# Patient Record
Sex: Female | Born: 1970
Health system: Southern US, Community
[De-identification: ages and names within clinical notes are randomized; demographics above are authoritative.]

## PROBLEM LIST (undated history)

## (undated) DIAGNOSIS — J40 Bronchitis, not specified as acute or chronic: Secondary | ICD-10-CM

## (undated) DIAGNOSIS — I10 Essential (primary) hypertension: Secondary | ICD-10-CM

## (undated) DIAGNOSIS — F32A Depression, unspecified: Secondary | ICD-10-CM

## (undated) DIAGNOSIS — F419 Anxiety disorder, unspecified: Secondary | ICD-10-CM

## (undated) DIAGNOSIS — F329 Major depressive disorder, single episode, unspecified: Secondary | ICD-10-CM

## (undated) DIAGNOSIS — F319 Bipolar disorder, unspecified: Secondary | ICD-10-CM

## (undated) DIAGNOSIS — I499 Cardiac arrhythmia, unspecified: Secondary | ICD-10-CM

## (undated) DIAGNOSIS — R51 Headache: Secondary | ICD-10-CM

## (undated) DIAGNOSIS — K529 Noninfective gastroenteritis and colitis, unspecified: Secondary | ICD-10-CM

## (undated) HISTORY — DX: Depression, unspecified: F32.A

## (undated) HISTORY — DX: Major depressive disorder, single episode, unspecified: F32.9

## (undated) HISTORY — DX: Headache: R51

## (undated) HISTORY — DX: Cardiac arrhythmia, unspecified: I49.9

## (undated) HISTORY — PX: BONE GRAFT HIP ILIAC CREST: SUR159

## (undated) HISTORY — DX: Anxiety disorder, unspecified: F41.9

## (undated) HISTORY — PX: NECK SURGERY: SHX720

## (undated) HISTORY — DX: Bipolar disorder, unspecified: F31.9

## (undated) HISTORY — DX: Noninfective gastroenteritis and colitis, unspecified: K52.9

## (undated) HISTORY — DX: Essential (primary) hypertension: I10

---

## 1995-06-07 HISTORY — PX: TUBAL LIGATION: SHX77

## 2003-11-28 ENCOUNTER — Emergency Department (HOSPITAL_COMMUNITY): Admission: EM | Admit: 2003-11-28 | Discharge: 2003-11-28 | Payer: Self-pay | Admitting: Emergency Medicine

## 2004-03-10 ENCOUNTER — Observation Stay (HOSPITAL_COMMUNITY): Admission: AC | Admit: 2004-03-10 | Discharge: 2004-03-11 | Payer: Self-pay

## 2005-06-06 HISTORY — PX: CARDIAC VALVE SURGERY: SHX40

## 2006-02-02 ENCOUNTER — Other Ambulatory Visit: Admission: RE | Admit: 2006-02-02 | Discharge: 2006-02-02 | Payer: Self-pay | Admitting: Family Medicine

## 2006-05-23 ENCOUNTER — Ambulatory Visit (HOSPITAL_COMMUNITY): Admission: RE | Admit: 2006-05-23 | Discharge: 2006-05-23 | Payer: Self-pay | Admitting: Family Medicine

## 2006-06-30 ENCOUNTER — Encounter
Admission: RE | Admit: 2006-06-30 | Discharge: 2006-09-28 | Payer: Self-pay | Admitting: Physical Medicine & Rehabilitation

## 2006-07-03 ENCOUNTER — Ambulatory Visit: Payer: Self-pay | Admitting: Physical Medicine & Rehabilitation

## 2006-08-11 ENCOUNTER — Other Ambulatory Visit: Admission: RE | Admit: 2006-08-11 | Discharge: 2006-08-11 | Payer: Self-pay | Admitting: Obstetrics and Gynecology

## 2006-08-29 ENCOUNTER — Encounter: Admission: RE | Admit: 2006-08-29 | Discharge: 2006-08-29 | Payer: Self-pay | Admitting: Orthopaedic Surgery

## 2006-09-05 ENCOUNTER — Ambulatory Visit (HOSPITAL_COMMUNITY): Admission: RE | Admit: 2006-09-05 | Discharge: 2006-09-05 | Payer: Self-pay | Admitting: Obstetrics and Gynecology

## 2006-11-21 ENCOUNTER — Encounter: Payer: Self-pay | Admitting: Orthopaedic Surgery

## 2006-11-21 ENCOUNTER — Ambulatory Visit (HOSPITAL_COMMUNITY): Admission: RE | Admit: 2006-11-21 | Discharge: 2006-11-21 | Payer: Self-pay | Admitting: Diagnostic Radiology

## 2006-12-13 ENCOUNTER — Ambulatory Visit: Payer: Self-pay | Admitting: Cardiology

## 2006-12-25 ENCOUNTER — Ambulatory Visit: Payer: Self-pay

## 2006-12-25 ENCOUNTER — Encounter: Payer: Self-pay | Admitting: Cardiology

## 2006-12-30 ENCOUNTER — Emergency Department (HOSPITAL_COMMUNITY): Admission: EM | Admit: 2006-12-30 | Discharge: 2006-12-30 | Payer: Self-pay | Admitting: Emergency Medicine

## 2007-01-04 ENCOUNTER — Ambulatory Visit: Payer: Self-pay | Admitting: Internal Medicine

## 2007-01-22 ENCOUNTER — Ambulatory Visit: Payer: Self-pay | Admitting: Internal Medicine

## 2007-01-22 LAB — CONVERTED CEMR LAB
BUN: 5 mg/dL — ABNORMAL LOW (ref 6–23)
Basophils Absolute: 0 10*3/uL (ref 0.0–0.1)
Basophils Relative: 0.1 % (ref 0.0–1.0)
CO2: 27 meq/L (ref 19–32)
Calcium: 8.9 mg/dL (ref 8.4–10.5)
Chloride: 109 meq/L (ref 96–112)
Creatinine, Ser: 0.9 mg/dL (ref 0.4–1.2)
Eosinophils Absolute: 0.4 10*3/uL (ref 0.0–0.6)
Eosinophils Relative: 3.6 % (ref 0.0–5.0)
GFR calc Af Amer: 90 mL/min
GFR calc non Af Amer: 74 mL/min
Glucose, Bld: 94 mg/dL (ref 70–99)
HCT: 46.4 % — ABNORMAL HIGH (ref 36.0–46.0)
Hemoglobin: 16.1 g/dL — ABNORMAL HIGH (ref 12.0–15.0)
Lymphocytes Relative: 21.2 % (ref 12.0–46.0)
MCHC: 34.7 g/dL (ref 30.0–36.0)
MCV: 95.4 fL (ref 78.0–100.0)
Monocytes Absolute: 1.3 10*3/uL — ABNORMAL HIGH (ref 0.2–0.7)
Monocytes Relative: 11.2 % — ABNORMAL HIGH (ref 3.0–11.0)
Neutro Abs: 7.2 10*3/uL (ref 1.4–7.7)
Neutrophils Relative %: 63.9 % (ref 43.0–77.0)
Platelets: 241 10*3/uL (ref 150–400)
Potassium: 3.8 meq/L (ref 3.5–5.1)
RBC: 4.87 M/uL (ref 3.87–5.11)
RDW: 13.2 % (ref 11.5–14.6)
Sodium: 140 meq/L (ref 135–145)
WBC: 11.3 10*3/uL — ABNORMAL HIGH (ref 4.5–10.5)

## 2007-01-24 ENCOUNTER — Ambulatory Visit: Payer: Self-pay | Admitting: Internal Medicine

## 2007-01-24 ENCOUNTER — Ambulatory Visit (HOSPITAL_COMMUNITY): Admission: RE | Admit: 2007-01-24 | Discharge: 2007-01-25 | Payer: Self-pay | Admitting: Internal Medicine

## 2007-02-22 ENCOUNTER — Ambulatory Visit: Payer: Self-pay | Admitting: Cardiology

## 2007-02-28 ENCOUNTER — Ambulatory Visit: Payer: Self-pay

## 2007-05-17 ENCOUNTER — Emergency Department (HOSPITAL_COMMUNITY): Admission: EM | Admit: 2007-05-17 | Discharge: 2007-05-17 | Payer: Self-pay | Admitting: Emergency Medicine

## 2007-05-24 ENCOUNTER — Ambulatory Visit (HOSPITAL_BASED_OUTPATIENT_CLINIC_OR_DEPARTMENT_OTHER): Admission: RE | Admit: 2007-05-24 | Discharge: 2007-05-24 | Payer: Self-pay | Admitting: *Deleted

## 2007-06-21 ENCOUNTER — Ambulatory Visit: Payer: Self-pay | Admitting: Pulmonary Disease

## 2007-07-20 ENCOUNTER — Ambulatory Visit: Payer: Self-pay | Admitting: Internal Medicine

## 2007-11-16 ENCOUNTER — Emergency Department (HOSPITAL_COMMUNITY): Admission: EM | Admit: 2007-11-16 | Discharge: 2007-11-16 | Payer: Self-pay | Admitting: Emergency Medicine

## 2007-11-22 ENCOUNTER — Emergency Department (HOSPITAL_COMMUNITY): Admission: EM | Admit: 2007-11-22 | Discharge: 2007-11-22 | Payer: Self-pay | Admitting: Emergency Medicine

## 2007-12-11 ENCOUNTER — Ambulatory Visit: Payer: Self-pay | Admitting: Internal Medicine

## 2008-01-01 ENCOUNTER — Ambulatory Visit: Payer: Self-pay | Admitting: Internal Medicine

## 2009-06-06 ENCOUNTER — Ambulatory Visit: Payer: Self-pay | Admitting: Family Medicine

## 2009-06-06 DIAGNOSIS — J209 Acute bronchitis, unspecified: Secondary | ICD-10-CM | POA: Insufficient documentation

## 2009-06-06 LAB — CONVERTED CEMR LAB: Rapid Strep: NEGATIVE

## 2009-06-11 ENCOUNTER — Encounter (INDEPENDENT_AMBULATORY_CARE_PROVIDER_SITE_OTHER): Payer: Self-pay | Admitting: *Deleted

## 2009-07-20 ENCOUNTER — Ambulatory Visit: Payer: Self-pay | Admitting: Cardiology

## 2009-07-20 ENCOUNTER — Emergency Department (HOSPITAL_COMMUNITY): Admission: EM | Admit: 2009-07-20 | Discharge: 2009-07-20 | Payer: Self-pay | Admitting: Emergency Medicine

## 2009-07-29 ENCOUNTER — Encounter: Payer: Self-pay | Admitting: Internal Medicine

## 2009-07-30 ENCOUNTER — Encounter: Payer: Self-pay | Admitting: Physician Assistant

## 2009-07-30 ENCOUNTER — Ambulatory Visit: Payer: Self-pay | Admitting: Cardiology

## 2009-07-30 DIAGNOSIS — I456 Pre-excitation syndrome: Secondary | ICD-10-CM

## 2009-07-30 DIAGNOSIS — I10 Essential (primary) hypertension: Secondary | ICD-10-CM

## 2009-07-30 DIAGNOSIS — I471 Supraventricular tachycardia, unspecified: Secondary | ICD-10-CM

## 2009-07-30 DIAGNOSIS — E781 Pure hyperglyceridemia: Secondary | ICD-10-CM | POA: Insufficient documentation

## 2009-07-30 DIAGNOSIS — I2510 Atherosclerotic heart disease of native coronary artery without angina pectoris: Secondary | ICD-10-CM

## 2009-07-30 DIAGNOSIS — F17200 Nicotine dependence, unspecified, uncomplicated: Secondary | ICD-10-CM | POA: Insufficient documentation

## 2009-07-30 DIAGNOSIS — F341 Dysthymic disorder: Secondary | ICD-10-CM

## 2009-07-30 HISTORY — DX: Supraventricular tachycardia, unspecified: I47.10

## 2009-07-30 HISTORY — DX: Pure hyperglyceridemia: E78.1

## 2009-07-30 HISTORY — DX: Essential (primary) hypertension: I10

## 2010-01-02 ENCOUNTER — Emergency Department (HOSPITAL_COMMUNITY): Admission: EM | Admit: 2010-01-02 | Discharge: 2010-01-02 | Payer: Self-pay | Admitting: Emergency Medicine

## 2010-03-30 ENCOUNTER — Ambulatory Visit: Payer: Self-pay | Admitting: Cardiology

## 2010-03-30 ENCOUNTER — Observation Stay (HOSPITAL_COMMUNITY): Admission: EM | Admit: 2010-03-30 | Discharge: 2010-03-31 | Payer: Self-pay | Admitting: Emergency Medicine

## 2010-04-02 ENCOUNTER — Telehealth (INDEPENDENT_AMBULATORY_CARE_PROVIDER_SITE_OTHER): Payer: Self-pay | Admitting: *Deleted

## 2010-05-12 ENCOUNTER — Encounter (INDEPENDENT_AMBULATORY_CARE_PROVIDER_SITE_OTHER): Payer: Self-pay | Admitting: *Deleted

## 2010-06-27 ENCOUNTER — Encounter: Payer: Self-pay | Admitting: Obstetrics and Gynecology

## 2010-06-28 ENCOUNTER — Encounter: Payer: Self-pay | Admitting: Orthopaedic Surgery

## 2010-07-06 NOTE — Letter (Signed)
Summary: Appointment - Missed  [facility], Main Office  [address]   [place], [place] [address]   Phone: [phone]  Fax: [phone]     May 12, 2010 MRN: [id number]   [facility] [address] [place], [place]  [address]   Dear Ms. Allsbrook,  Our records indicate you missed your appointment on 11-29-2011with Dr. Graciela Husbands .                                    It is very important that we reach you to reschedule this appointment. We look forward to participating in your health care needs. Please contact us at the number listed above at your earliest convenience to reschedule this appointment.     Sincerely,   Lorne Skeens  [facility] Scheduling Team

## 2010-07-06 NOTE — Assessment & Plan Note (Signed)
Summary: eph/jss   Visit Type:  EPH   History of Present Illness: this is a 40 year old white female patient, who has a history of WPW and underwent ablation in August of 2008. She also had a negative Myoview at that time for chest pain. She presented emergency room July 20, 2009 with recurrent palpitations that have been worsening over the past 6 months. She was found to be in PAT at approximately 150 beats per minute. She was seen by Dr. all read in the emergency room, who discussed admission, and possible repeat ablation versus medications. The patient opted to try medications and was started on Cardizem and sent home for outpatient followup.  The patient continues to have palpitations worse with any activity. She said her heart races and sometimes it skips. she does become a little dizzy, but has not had any syncope. She denies chest pain, dyspnea, or dyspnea on exertion with this.She says her Cardizem has not made any of it better. She continues to drink quite a bit of caffeine. She was drinking a half a liter to 2 L of Coke per day. She is trying to add caffeine free diet coke and do this but is developing severe headaches.  Current Medications (verified): 1)  Cymbalta 60 Mg Cpep (Duloxetine Hcl) .... Take One Capsule Once Daily 2)  Trazodone Hcl 300 Mg Tabs (Trazodone Hcl) .Marland Kitchen.. 1 Tab By Mouth Once Daily 3)  Sumatriptan Succinate 100 Mg Tabs (Sumatriptan Succinate) .Marland Kitchen.. 1 Tab As Needed 4)  Naproxen 500 Mg Tabs (Naproxen) .Marland Kitchen.. 1 Tab Two Times A Day 5)  Pataday 0.2 % Soln (Olopatadine Hcl) .Marland Kitchen.. 1 Gtt Ou Once Daily 6)  Nexium 40 Mg Cpdr (Esomeprazole Magnesium) .Marland Kitchen.. 1 Cap Once Daily 7)  Diltiazem Hcl Cr 240 Mg Xr24h-Cap (Diltiazem Hcl) .Marland Kitchen.. 1 Cap Once Daily 8)  Seroquel Xr 200 Mg Xr24h-Tab (Quetiapine Fumarate) .Marland Kitchen.. 1 Tab At 6 Pm 9)  Clonazepam 1 Mg Tabs (Clonazepam) .Marland Kitchen.. 1 Tab Two Times A Day  Allergies (verified): No Known Drug Allergies  Past History:  Family history reviewed for  relevance to current acute and chronic problems.her mother was recently diagnosed with atrial fibrillation  Past Medical History: Reviewed history from 05/14/2009 and no changes required. Wolff-Parkinson-White syndrome.      a.     Status post radio-frequency ablation January 24, 2007 for        antidromic supraventricular tachycardia.  History of atypical chest pain      a.     December 25, 2006, Adenosine Myoview with ejection fraction of        52%. Normal stress nuclear study  Migraine headaches  History of hypertriglyceridemia  Ongoing tobacco abuse.   Anxiety   History of hypertension  GERD  Past Surgical History: Reviewed history from 05/14/2009 and no changes required.  radiofrequency catheter ablation  Family History: Reviewed history from 05/14/2009 and no changes required. Significant for the death of the patient's father at age 63 with a brain tumor.   Her mother apparently has some type of cardiac disorder in her mid 20s (details not clear)  Social History: Reviewed history from 05/14/2009 and no changes required. Patient is divorced.   She has three children.   She is presently unemployed.   She has a 1-1/2 pack per day tobacco use history for 15 years.  Denies any recreational drug use or alcohol use.  She does admit to fairly significant caffeine use.   She is not exercising regularly.   Review  of Systems       see history of present illness  Vital Signs:  Patient profile:   40 year old female Height:      65 inches Weight:      191 pounds BMI:     31.90 Pulse rate:   101 / minute Pulse rhythm:   irregular BP sitting:   134 / 80  (left arm) Cuff size:   large  Vitals Entered By: Danielle Rankin, CMA (July 30, 2009 12:20 PM)  Physical Exam  General:   Well-nournished, in no acute distress. Neck: No JVD, HJR, Bruit, or thyroid enlargement Lungs: No tachypnea, clear without wheezing, rales, or rhonchi Cardiovascular: RRR, PMI not displaced, heart  sounds normal, no murmurs, gallops, bruit, thrill, or heave. Abdomen: BS normal. Soft without organomegaly, masses, lesions or tenderness. Extremities: without cyanosis, clubbing or edema. Good distal pulses bilateral SKin: Warm, no lesions or rashes  Musculoskeletal: No deformities Neuro: no focal signs    EKG  Procedure date:  07/30/2009  Findings:      sinus tachycardia at 101 beats per minute, nonspecific ST-T wave changes, poor R wave progression  Impression & Recommendations:  Problem # 1:  SVT/ PSVT/ PAT (ICD-427.0) Patient had recurrent PAT in the hospital and was given a trial of Cardizem. This has not helped. I will increase her Cardizem to 240 mg in the morning. 120 in the evening. I've asked her to try harder to cut back on her caffeine. We will try to move her appointment up to see Dr. Sherryl Manges, back sooner. Her updated medication list for this problem includes:    Diltiazem Hcl Cr 240 Mg Xr24h-cap (Diltiazem hcl) .Marland Kitchen... 1 cap once daily    Diltiazem Hcl Er Beads 120 Mg Xr24h-cap (Diltiazem hcl er beads) .Marland Kitchen... Take one capsule by mouth every  evening  Problem # 2:  WPW (ICD-426.7) Patient has a history of WPW treated with ablation in 2008. Her updated medication list for this problem includes:    Diltiazem Hcl Cr 240 Mg Xr24h-cap (Diltiazem hcl) .Marland Kitchen... 1 cap once daily    Diltiazem Hcl Er Beads 120 Mg Xr24h-cap (Diltiazem hcl er beads) .Marland Kitchen... Take one capsule by mouth every  evening  Problem # 3:  HYPERTENSION, BENIGN (ICD-401.1) blood pressure stable Her updated medication list for this problem includes:    Diltiazem Hcl Cr 240 Mg Xr24h-cap (Diltiazem hcl) .Marland Kitchen... 1 cap once daily    Diltiazem Hcl Er Beads 120 Mg Xr24h-cap (Diltiazem hcl er beads) .Marland Kitchen... Take one capsule by mouth every  evening  Other Orders: EKG w/ Interpretation (93000)  Patient Instructions: 1)  Your physician recommends that you schedule a follow-up appointment in: 2 weeks with Dr. Graciela Husbands 2)   Your MD recommeds to wean off caffeine. New medication: Diltiazem 120 mg take one tablet by mouth  every evening Prescriptions: DILTIAZEM HCL ER BEADS 120 MG XR24H-CAP (DILTIAZEM HCL ER BEADS) Take one capsule by mouth every  evening  #30 x 6   Entered by:   Ollen Gross, RN, BSN   Authorized by:   Marletta Lor, PA-C   Signed by:   Ollen Gross, RN, BSN on 07/30/2009   Method used:   Electronically to        [facility]  [address] [id number] (retail)       [address]       [place], [place]  [address]  Ph: [phone]       Fax: [phone]   RxID:   [id number]

## 2010-07-06 NOTE — Letter (Signed)
Summary: Appointment - Missed  [facility], Main Office  [address]   [place], [place] [address]   Phone: [phone]  Fax: [phone]     June 11, 2009 MRN: [id number]   [facility] [address] [place], [place]  [address]   Dear Adriana Hall,  Our records indicate you missed your appointment on 05/15/09 with Dr. Graciela Husbands.                                    It is very important that we reach you to reschedule this appointment. We look forward to participating in your health care needs. Please contact us at the number listed above at your earliest convenience to reschedule this appointment.     Sincerely,   [facility] Scheduling Team

## 2010-07-06 NOTE — Assessment & Plan Note (Signed)
Summary: Chest congestion, ear pain-Both, eye drainage - Both x 1 wk rm 3   Vital Signs:  Patient Profile:   40 Years Old Female CC:      Cold & URI symptoms Height:     65 inches Weight:      187 pounds O2 Sat:      97 % O2 treatment:    Room Air Temp:     97.2 degrees F oral Pulse rate:   94 / minute Pulse rhythm:   regular Resp:     16 per minute BP sitting:   133 / 80  (right arm) Cuff size:   regular  Vitals Entered By: Areta Haber CMA (June 06, 2009 12:00 PM)                  Prior Medication List:  FROVA 2.5 MG TABS (FROVATRIPTAN SUCCINATE) as needed MIGRANAL 4 MG/ML SOLN (DIHYDROERGOTAMINE MESYLATE) 1 spray two times a day NEXIUM 40 MG CPDR (ESOMEPRAZOLE MAGNESIUM) once daily DICLOFENAC SODIUM 75 MG TBEC (DICLOFENAC SODIUM) two times a day SEROQUEL 300 MG TABS (QUETIAPINE FUMARATE) take one tablet once daily CYMBALTA 60 MG CPEP (DULOXETINE HCL) take one capsule once daily CYCLOBENZAPRINE HCL 10 MG TABS (CYCLOBENZAPRINE HCL) two times a day METOCLOPRAMIDE HCL 10 MG TABS (METOCLOPRAMIDE HCL) take one tablet once daily   Current Allergies: No known allergies History of Present Illness Chief Complaint: Cold & URI symptoms History of Present Illness: Patient has been sick since Wednesday before Christmas. Not feeling better feeling worse. Head congestion facial pain and head pain. She feels as if something was on her chest and family have reported thatb she was wheezing at night. No rfever but both a productive and non productive cough.   Current Problems: ACUTE NASOPHARYNGITIS (ICD-460) OTITIS MEDIA, PURULENT, ACUTE (ICD-382.00) BRONCHITIS, ACUTE WITH BRONCHOSPASM (ICD-466.0)   Current Meds CYMBALTA 60 MG CPEP (DULOXETINE HCL) take one capsule once daily TRAZODONE HCL 300 MG TABS (TRAZODONE HCL) 1 tab by mouth once daily SUDAFED PE MAXIMUM STRENGTH 10 MG TABS (PHENYLEPHRINE HCL) As directed MUCINEX MAXIMUM STRENGTH 1200 MG XR12H-TAB (GUAIFENESIN) As  directed DELSYM 30 MG/5ML LQCR (DEXTROMETHORPHAN POLISTIREX) As directed AUGMENTIN 875-125 MG TABS (AMOXICILLIN-POT CLAVULANATE) 1 by mouth 2 times daily TUSSIONEX PENNKINETIC ER 8-10 MG/5ML LQCR (CHLORPHENIRAMINE-HYDROCODONE) 1 tsp by mouth twice a day PROAIR HFA 108 (90 BASE) MCG/ACT  AERS (ALBUTEROL SULFATE) 2 inh q4h as needed shortness of breath  REVIEW OF SYSTEMS Constitutional Symptoms       Complains of fatigue.     Denies fever, chills, night sweats, weight loss, and weight gain.  Eyes       Complains of eye drainage.      Denies change in vision, eye pain, glasses, contact lenses, and eye surgery.      Comments: Both Ear/Nose/Throat/Mouth       Complains of ear pain, sore throat, and hoarseness.      Denies hearing loss/aids, change in hearing, ear discharge, dizziness, frequent runny nose, frequent nose bleeds, sinus problems, and tooth pain or bleeding.      Comments: Both Respiratory       Complains of dry cough, productive cough, wheezing, shortness of breath, asthma, and bronchitis.      Denies emphysema/COPD.  Cardiovascular       Denies murmurs, chest pain, and tires easily with exhertion.    Gastrointestinal       Denies stomach pain, nausea/vomiting, diarrhea, constipation, blood in bowel movements, and indigestion. Genitourniary  Denies painful urination, kidney stones, and loss of urinary control. Neurological       Denies paralysis, seizures, and fainting/blackouts. Musculoskeletal       Denies muscle pain, joint pain, joint stiffness, decreased range of motion, redness, swelling, muscle weakness, and gout.  Skin       Denies bruising, unusual mles/lumps or sores, and hair/skin or nail changes.  Psych       Denies mood changes, temper/anger issues, anxiety/stress, speech problems, depression, and sleep problems. Other Comments: Nasal congestion x 1 week. Pt has not beens seen by PCP   Past History:  Past Medical History: Last updated:  06/13/09 Wolff-Parkinson-White syndrome.      a.     Status post radio-frequency ablation January 24, 2007 for        antidromic supraventricular tachycardia.  History of atypical chest pain      a.     December 25, 2006, Adenosine Myoview with ejection fraction of        52%. Normal stress nuclear study  Migraine headaches  History of hypertriglyceridemia  Ongoing tobacco abuse.   Anxiety   History of hypertension  GERD  Past Surgical History: Last updated: 06-13-09  radiofrequency catheter ablation  Family History: Last updated: June 13, 2009 Significant for the death of the patient's father at age 42 with a brain tumor.   Her mother apparently has some type of cardiac disorder in her mid 38s (details not clear)  Social History: Last updated: 06/13/2009 Patient is divorced.   She has three children.   She is presently unemployed.   She has a 1-1/2 pack per day tobacco use history for 15 years.  Denies any recreational drug use or alcohol use.  She does admit to fairly significant caffeine use.   She is not exercising regularly.   Family History: Reviewed history from Jun 13, 2009 and no changes required. Significant for the death of the patient's father at age 57 with a brain tumor.   Her mother apparently has some type of cardiac disorder in her mid 60s (details not clear)  Social History: Reviewed history from 13-Jun-2009 and no changes required. Patient is divorced.   She has three children.   She is presently unemployed.   She has a 1-1/2 pack per day tobacco use history for 15 years.  Denies any recreational drug use or alcohol use.  She does admit to fairly significant caffeine use.   She is not exercising regularly.  Physical Exam General appearance: well developed, well nourished, mild distress Head: normocephalic, atraumatic Ears: inflamed bilateral TM Nasal: swollen red turbinates with congestion Oral/Pharynx: pharyngeal erythema without exudate, uvula midline  without deviation Neck: supple,anterior lymphadenopathy present Chest/Lungs: scattered rhonchi Heart: regular rate and  rhythm, no murmur Skin: no obvious rashes or lesions MSE: oriented to time, place, and person Assessment New Problems: ACUTE NASOPHARYNGITIS (ICD-460) OTITIS MEDIA, PURULENT, ACUTE (ICD-382.00) BRONCHITIS, ACUTE WITH BRONCHOSPASM (ICD-466.0)  pharyngitis    bronchitis  Patient Education: Patient and/or caregiver instructed in the following: rest fluids and Tylenol, quit smoking.  Plan New Medications/Changes: PROAIR HFA 108 (90 BASE) MCG/ACT  AERS (ALBUTEROL SULFATE) 2 inh q4h as needed shortness of breath  #1 x 0, 06/06/2009, Hassan Rowan MD TUSSIONEX PENNKINETIC ER 8-10 MG/5ML LQCR (CHLORPHENIRAMINE-HYDROCODONE) 1 tsp by mouth twice a day  #11fl oz x 0, 06/06/2009, Hassan Rowan MD AUGMENTIN [phone] MG TABS (AMOXICILLIN-POT CLAVULANATE) 1 by mouth 2 times daily  #20 x 0, 06/06/2009, Hassan Rowan MD  New Orders: New Patient Level  III [id number] Rapid Strep [id number] Nebulizer Tx [94640] Solumedrol up to 125mg  [J2930] Admin of Therapeutic Inj  intramuscular or subcutaneous [96372] Albuterol Sulfate Sol 1mg  unit dose [J7613] Ipratropium inhalation sol. unit dose [Y8657] Follow Up: Follow up in 2-3 days if no improvement, Follow up on an as needed basis, Follow up with Primary Physician  The patient and/or caregiver has been counseled thoroughly with regard to medications prescribed including dosage, schedule, interactions, rationale for use, and possible side effects and they verbalize understanding.  Diagnoses and expected course of recovery discussed and will return if not improved as expected or if the condition worsens. Patient and/or caregiver verbalized understanding.  Prescriptions: PROAIR HFA 108 (90 BASE) MCG/ACT  AERS (ALBUTEROL SULFATE) 2 inh q4h as needed shortness of breath  #1 x 0   Entered and Authorized by:   Hassan Rowan MD   Signed by:   Hassan Rowan MD on  06/06/2009   Method used:   Print then Give to Patient   RxID:   [id number] TUSSIONEX PENNKINETIC ER 8-10 MG/5ML LQCR (CHLORPHENIRAMINE-HYDROCODONE) 1 tsp by mouth twice a day  #81fl oz x 0   Entered and Authorized by:   Hassan Rowan MD   Signed by:   Hassan Rowan MD on 06/06/2009   Method used:   Print then Give to Patient   RxID:   [id number] AUGMENTIN 875-125 MG TABS (AMOXICILLIN-POT CLAVULANATE) 1 by mouth 2 times daily  #20 x 0   Entered and Authorized by:   Hassan Rowan MD   Signed by:   Hassan Rowan MD on 06/06/2009   Method used:   Print then Give to Patient   RxID:   [id number]   Patient Instructions: 1)  Please schedule a follow-up appointment as needed. 2)  Please schedule an appointment with your primary doctor in :3-14 days 3)  Tobacco is very bad for your health and your loved ones! You Should stop smoking!. 4)  Stop Smoking Tips: Choose a Quit date. Cut down before the Quit date. decide what you will do as a substitute when you feel the urge to smoke(gum,toothpick,exercise). 5)  Take your antibiotic as prescribed until ALL of it is gone, but stop if you develop a rash or swelling and contact our office as soon as possible. 6)  Acute sinusitis symptoms for less than 10 days are not helped by antibiotics.Use warm moist compresses, and over the counter decongestants ( only as directed). Call if no improvement in 5-7 days, sooner if increasing pain, fever, or new symptoms. 7)  Acute bronchitis symptoms for less than 10 days are not helped by antibiotics. take over the counter cough medications. call if no improvment in  5-7 days, sooner if increasing cough, fever, or new symptoms( shortness of breath, chest pain).   Medication Administration  Injection # 1:    Medication: Solumedrol up to 125mg     Diagnosis: BRONCHITIS, ACUTE WITH BRONCHOSPASM (ICD-466.0)    Route: IM    Site: RUOQ gluteus    Exp Date: 10/04/2010    Lot #: [id number]    Mfr: Pharmacia     Comments: Administered 125 mg    Patient tolerated injection without complications    Given by: Areta Haber CMA (June 06, 2009 2:04 PM)  Medication # 1:    Medication: Albuterol Sulfate Sol 1mg  unit dose    Diagnosis: ACUTE NASOPHARYNGITIS (ICD-460)    Route: inhaled    Exp Date: 05/05/2010    Lot #: [id number]  Mfr: Mylan    Patient tolerated medication without complications    Given by: Areta Haber CMA (June 06, 2009 2:05 PM)  Medication # 2:    Medication: Ipratropium inhalation sol. unit dose    Diagnosis: ACUTE NASOPHARYNGITIS (ICD-460)    Route: inhaled    Exp Date: 05/05/2010    Lot #: [id number]    Mfr: Mylan    Patient tolerated medication without complications    Given by: Areta Haber CMA (June 06, 2009 2:05 PM)  Orders Added: 1)  New Patient Level III [99203] 2)  Rapid Strep [29937] 3)  Nebulizer Tx [94640] 4)  Solumedrol up to 125mg  [J2930] 5)  Admin of Therapeutic Inj  intramuscular or subcutaneous [96372] 6)  Albuterol Sulfate Sol 1mg  unit dose [J7613] 7)  Ipratropium inhalation sol. unit dose [J6967]   Laboratory Results  Date/Time Received: June 06, 2009 12:37 PM  Date/Time Reported: June 06, 2009 12:37 PM   Other Tests  Rapid Strep: negative  Kit Test Internal QC: Negative   (Normal Range: Negative)

## 2010-07-06 NOTE — Miscellaneous (Signed)
  Clinical Lists Changes  Observations: Added new observation of CXR RESULTS:  Findings: Lower cervical spine fixation. Midline trachea.  Patient   minimally rotated to the right. Normal heart size and mediastinal   contours. No pleural effusion or pneumothorax.  Diffuse   peribronchial thickening.  Increased density projecting over the   anterior 1st right rib.  This is felt to be due to summation of rib   shadow.  Similar in appearance on the 12/30/2006 exam.  Lungs   otherwise clear.    IMPRESSION:    1. No acute cardiopulmonary disease.   2. Peribronchial thickening which may relate to chronic bronchitis   or smoking.  (07/20/2009 8:40)      CXR  Procedure date:  07/20/2009  Findings:       Findings: Lower cervical spine fixation. Midline trachea.  Patient   minimally rotated to the right. Normal heart size and mediastinal   contours. No pleural effusion or pneumothorax.  Diffuse   peribronchial thickening.  Increased density projecting over the   anterior 1st right rib.  This is felt to be due to summation of rib   shadow.  Similar in appearance on the 12/30/2006 exam.  Lungs   otherwise clear.    IMPRESSION:    1. No acute cardiopulmonary disease.   2. Peribronchial thickening which may relate to chronic bronchitis   or smoking.

## 2010-07-06 NOTE — Progress Notes (Signed)
Summary: event monitor  Phone Note Outgoing Call Call back at [facility] Phone [phone]   Call placed by: Stanton Kidney, EMT-P,  April 12, 2010 2:11 PM Summary of Call: Left message for patient to call back regarding event monitor. Stanton Kidney, EMT-P  April 02, 2010 9:32 AM  Left message ref: event monitor. Stanton Kidney, EMT-P  April 12, 2010 2:12 PM  Informed home number is wrong #, per the resident at that numer, no one named Jeanett lives there. Stanton Kidney, EMT-P  April 12, 2010 3:15 PM      Appended Document: event monitor try [phone]. Claris Gladden, RN, BSN

## 2010-08-18 LAB — CBC
HCT: 46.8 % — ABNORMAL HIGH (ref 36.0–46.0)
Hemoglobin: 16.4 g/dL — ABNORMAL HIGH (ref 12.0–15.0)
MCV: 90.7 fL (ref 78.0–100.0)
RDW: 14.4 % (ref 11.5–15.5)
WBC: 12.5 10*3/uL — ABNORMAL HIGH (ref 4.0–10.5)

## 2010-08-18 LAB — CARDIAC PANEL(CRET KIN+CKTOT+MB+TROPI)
CK, MB: 0.6 ng/mL (ref 0.3–4.0)
CK, MB: 0.6 ng/mL (ref 0.3–4.0)
Total CK: 44 U/L (ref 7–177)

## 2010-08-18 LAB — CK TOTAL AND CKMB (NOT AT ARMC)
Relative Index: INVALID (ref 0.0–2.5)
Total CK: 47 U/L (ref 7–177)

## 2010-08-18 LAB — DIFFERENTIAL
Eosinophils Relative: 2 % (ref 0–5)
Lymphocytes Relative: 24 % (ref 12–46)
Monocytes Absolute: 1.2 10*3/uL — ABNORMAL HIGH (ref 0.1–1.0)
Monocytes Relative: 10 % (ref 3–12)
Neutro Abs: 8 10*3/uL — ABNORMAL HIGH (ref 1.7–7.7)

## 2010-08-18 LAB — PROTIME-INR
Prothrombin Time: 14.2 seconds (ref 11.6–15.2)
Prothrombin Time: 14.3 seconds (ref 11.6–15.2)

## 2010-08-18 LAB — URINE MICROSCOPIC-ADD ON

## 2010-08-18 LAB — LIPID PANEL
Cholesterol: 160 mg/dL (ref 0–200)
HDL: 28 mg/dL — ABNORMAL LOW (ref >39)
Total CHOL/HDL Ratio: 5.7 RATIO
Triglycerides: 215 mg/dL — ABNORMAL HIGH (ref <150)

## 2010-08-18 LAB — URINALYSIS, ROUTINE W REFLEX MICROSCOPIC
Bilirubin Urine: NEGATIVE
Glucose, UA: NEGATIVE mg/dL
Hgb urine dipstick: NEGATIVE
Ketones, ur: NEGATIVE mg/dL
Specific Gravity, Urine: 1.022 (ref 1.005–1.030)
pH: 7.5 (ref 5.0–8.0)

## 2010-08-18 LAB — BASIC METABOLIC PANEL
BUN: 9 mg/dL (ref 6–23)
Chloride: 107 mEq/L (ref 96–112)
GFR calc non Af Amer: >60 mL/min (ref >60)
Glucose, Bld: 104 mg/dL — ABNORMAL HIGH (ref 70–99)
Potassium: 3.9 mEq/L (ref 3.5–5.1)
Sodium: 139 mEq/L (ref 135–145)

## 2010-08-18 LAB — POCT CARDIAC MARKERS: Troponin i, poc: <0.05 ng/mL (ref 0.00–0.09)

## 2010-08-18 LAB — TSH: TSH: 0.756 u[IU]/mL (ref 0.350–4.500)

## 2010-08-18 LAB — APTT: aPTT: 36 seconds (ref 24–37)

## 2010-08-25 LAB — RAPID URINE DRUG SCREEN, HOSP PERFORMED
Barbiturates: NOT DETECTED
Opiates: NOT DETECTED
Tetrahydrocannabinol: NOT DETECTED

## 2010-08-25 LAB — POCT PREGNANCY, URINE: Preg Test, Ur: NEGATIVE

## 2010-08-25 LAB — URINALYSIS, ROUTINE W REFLEX MICROSCOPIC
Bilirubin Urine: NEGATIVE
Nitrite: NEGATIVE
Specific Gravity, Urine: 1.005 (ref 1.005–1.030)
Urobilinogen, UA: 0.2 mg/dL (ref 0.0–1.0)

## 2010-08-25 LAB — DIFFERENTIAL
Basophils Absolute: 0.1 10*3/uL (ref 0.0–0.1)
Basophils Relative: 1 % (ref 0–1)
Neutro Abs: 6.7 10*3/uL (ref 1.7–7.7)
Neutrophils Relative %: 65 % (ref 43–77)

## 2010-08-25 LAB — POCT CARDIAC MARKERS
CKMB, poc: <1 ng/mL — ABNORMAL LOW (ref 1.0–8.0)
CKMB, poc: <1 ng/mL — ABNORMAL LOW (ref 1.0–8.0)
Myoglobin, poc: 31.2 ng/mL (ref 12–200)
Myoglobin, poc: 41.2 ng/mL (ref 12–200)
Troponin i, poc: <0.05 ng/mL (ref 0.00–0.09)
Troponin i, poc: <0.05 ng/mL (ref 0.00–0.09)

## 2010-08-25 LAB — CBC
MCHC: 34.8 g/dL (ref 30.0–36.0)
Platelets: 229 10*3/uL (ref 150–400)
RDW: 15 % (ref 11.5–15.5)

## 2010-08-25 LAB — BASIC METABOLIC PANEL
CO2: 24 mEq/L (ref 19–32)
Calcium: 9.3 mg/dL (ref 8.4–10.5)
Creatinine, Ser: 0.73 mg/dL (ref 0.4–1.2)
Glucose, Bld: 85 mg/dL (ref 70–99)

## 2010-09-07 ENCOUNTER — Encounter: Payer: Self-pay | Admitting: Family Medicine

## 2010-09-07 DIAGNOSIS — F4310 Post-traumatic stress disorder, unspecified: Secondary | ICD-10-CM | POA: Insufficient documentation

## 2010-09-07 DIAGNOSIS — G43909 Migraine, unspecified, not intractable, without status migrainosus: Secondary | ICD-10-CM | POA: Insufficient documentation

## 2010-09-07 DIAGNOSIS — IMO0002 Reserved for concepts with insufficient information to code with codable children: Secondary | ICD-10-CM | POA: Insufficient documentation

## 2010-09-07 DIAGNOSIS — M5030 Other cervical disc degeneration, unspecified cervical region: Secondary | ICD-10-CM | POA: Insufficient documentation

## 2010-09-07 DIAGNOSIS — R569 Unspecified convulsions: Secondary | ICD-10-CM

## 2010-09-07 HISTORY — DX: Post-traumatic stress disorder, unspecified: F43.10

## 2010-10-19 NOTE — Op Note (Signed)
NAMEYUMIKO, ALKINS              ACCOUNT NO.:  [id number]   MEDICAL RECORD NO.:  [id number]          PATIENT TYPE:  OIB   LOCATION:  6527                         FACILITY:  [facility]   PHYSICIAN:  Duke Salvia, MD, FACCDATE OF BIRTH:  [birth year]   DATE OF PROCEDURE:  01/24/2007  DATE OF DISCHARGE:                               OPERATIVE REPORT   PREOPERATIVE DIAGNOSIS:  Tachy palpitations with a manifest pre-  excitation.   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   PROCEDURE:  Invasive electrophysiological study, isoproterenol infusion,  arrhythmia mapping and radiofrequency catheter ablation.   Following obtaining of informed consent, the patient was brought to the  electrophysiology laboratory and placed on the fluoroscopic table in the  supine position.  After routine prep and drape cardiac catheterization  was performed local anesthesia and conscious sedation.  Noninvasive  blood pressure monitoring, transcutaneous oxygen saturation monitoring  and end-tidal CO2 monitoring.  Following the procedure the patient was  transferred to holding area for sheath removal.   Hemostasis was obtained and the patient was transferred to the holding  area.   CATHETERS:  A 5-French hexapolar catheter was inserted via femoral vein  AV junction.  A 5-French quadripolar catheter was inserted via femoral vein to the  right ventricular apex.  6-French octapolar catheter was inserted right femoral vein to the  coronary sinus.  7 French 5-mm deflectable tip ablation catheter was inserted right  femoral artery to mapping sites on the mitral annulus specifically from  the left atrial surface.   Surface leads 1, aVF and V1 were monitored continuously throughout the  procedure.  Following insertion of the catheters, stimulation protocol  included incremental atrial pacing.  Incremental ventricular pacing.  Single atrial  extra stimuli at paced cycle length 600 and 400  milliseconds in the presence and the absence of isoproterenol.  Burst atrial pacing.   RESULTS:  Surface cardiogram  Initial  Rhythm:  Sinus; RR interval:  937 milliseconds; PR interval:  135  milliseconds; QRS duration 131 milliseconds; QT interval 463  milliseconds; P-wave duration 117 milliseconds; AH interval was 100  milliseconds; HV interval was -4 milliseconds pre-excitation:  Present;  bundle branch block absent.  Final:  Rhythm:  Sinus; RR interval 888 milliseconds; PR interval:  192  milliseconds; QRS duration:  111 milliseconds; QT interval 379  milliseconds; P-wave duration 134 milliseconds; pre-excitation:  Absent;  bundle branch block:  Absent.  AH interval:  124 milliseconds; HV interval:  38 milliseconds.   AV nodal function.  AV Wenckebach was 320 milliseconds pre ablation 300 milliseconds post  ablation.  VA conduction was Wenckebach at 300 milliseconds post ablation.  The AV  nodal effective refractory period was not assessed post ablation.  It  was looked at pre ablation and AV nodal conduction was continuous at  multiple cycle lengths in the presence of and the absence of  isoproterenol.   ACCESSORY PATHWAY FUNCTION:  Antegrade only conducting accessory pathway  was noted at [place] site 810.  Effective refractory period antegrade  was approximately 450 milliseconds but this was  intermittent, that is  sometimes it conducted 360 milliseconds and at a pace cycle length of  600 milliseconds was 320 milliseconds.  As noted previously the pathway  did not conduct retrograde.   ARRHYTHMIAS INDUCED:  Wide complex tachycardia was induced with a cycle  length of 280 to 290 milliseconds.  It was induced via coronary sinus  pacing at 230 milliseconds and was ultimately terminated with premature  atrial beat.   Mapping of the arrhythmia demonstrated earliest ventricular activation  in the distal coronary sinus.   Ventricular stimulation during  tachycardia failed to pre-excite the atrium.  Coronary sinus stimulation  during the tachycardia was able to pre-excite with advancement of the  subsequent a.  Based on this participation of the accessory pathway was  demonstrated the mechanism of the tachycardia.   RADIOFREQUENCY ENERGY:  A total of 38 seconds of radio frequency energy  was then applied at sites of early ventricular activation using sinus  rhythm mapping and unipolar electrogram morphology.  A total of 38  seconds of RF was required and after the second impulse which is  unfortunately terminated somewhat prematurely after 3.6 seconds pre-  excitation loss was noted.   IMPRESSION:  1. Normal sinus function.  2. Normal atrial function.  3. Normal AV nodal function.  4. Normal His-Purkinje system function.  5. Manifest accessory pathway with antegrade only conduction mediating      antidromic supraventricular tachycardia was identified.      Elimination of the tachycardia substrate by elimination of the      conducting pathway was accomplished with radiofrequency energy.   SUMMARY:  In conclusion, results electrophysiological testing confirmed  participation of the manifest only accessory pathway in the patient's  tachycardia mechanism.  Radiofrequency energy successfully delivered at  the atrial insertion site eliminated the substrate for the patient's  tachycardia.  The patient tolerated the procedure well without apparent  complication.   The patient did have complaints of shoulder pain and chest pain  throughout prior and took much catheter manipulation.      Duke Salvia, MD, [facility]  Electronically Signed     SCK/MEDQ  D:  01/24/2007  T:  01/25/2007  Job:  [id number]   cc:   Jonelle Sidle, MD  Electrophys lab
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
## 2010-10-19 NOTE — Procedures (Signed)
NAMEJAKERRIA, Adriana Hall              ACCOUNT NO.:  [id number]   MEDICAL RECORD NO.:  [id number]          PATIENT TYPE:  OUT   LOCATION:  SLEEP CENTER                 FACILITY:  [facility]   PHYSICIAN:  Barbaraann Share, MD,FCCPDATE OF BIRTH:  1971/03/02   DATE OF STUDY:  05/24/2007                            NOCTURNAL POLYSOMNOGRAM   REFERRING PHYSICIAN:  Estanislado Pandy, MD   INDICATION FOR STUDY:  Hypersomnia with sleep apnea.   EPWORTH SLEEPINESS SCORE:  19.   SLEEP ARCHITECTURE:  Patient had a total sleep time of 391 minutes with  very little slow-wave sleep and decreased REM.  Sleep onset latency was  normal, and REM onset was prolonged at 264 minutes.  Sleep efficiency  was fairly good at 96%.   RESPIRATORY DATA:  The patient was found to have no obstructive apneas  and 18 obstructive hypopneas for an apnea-hypopnea index of 2.8 events  per hour.  However, she was also found to have 8 respiratory effort-  related arousals giving her a respiratory disturbance index of 4 events  per hour.  Loud snoring was noted throughout, and the events were more  common in the supine position.   OXYGEN DATA:  There was O2 desaturation as low as 87% with the patient's  obstructive events.   CARDIAC DATA:  No clinically significant cardiac arrhythmias were noted.   MOVEMENT-PARASOMNIA:  None.   IMPRESSIONS-RECOMMENDATIONS:  Small numbers of obstructive events which  do not meet the apnea-hypopnea index criteria for the obstructive sleep  apnea syndrome.  However, the patient clearly had many more episodes of  hypopnea by air flow criteria, however, did not meet the AASM standards  for desaturation criteria.  Therefore, these events were not scored.  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  Clinical correlation is suggested.     Barbaraann Share, MD,FCCP  Diplomate,  American Board of Sleep  Medicine  Electronically Signed    KMC/MEDQ  D:  06/21/2007 14:07:04  T:  06/21/2007 14:48:13  Job:  [id number]
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
## 2010-10-19 NOTE — Letter (Signed)
January 04, 2007    Jonelle Sidle, MD  [phone] [address]  [place], [place] [address]   RE:  Adriana Hall, Adriana Hall  MRN:  [id number]  /  DOB:  [birth year]   Dear Doreatha Martin:   It was pleasure to see Adriana Hall today at your request because of  Wolff-Parkinson-White syndrome that you picked up on electrocardiogram.  She has a longstanding history of abrupt onset tachy palpitations  associated even with syncope.  These episodes particularly last only  about a minute or two.  They are aggravated by caffeine which she  continues to utilize.  She does not use over-the-counter cold medicines  and chocolate is not a problem either.   Interestingly, she has been on propranolol for a long time for migraine  headaches.  This has had no significant impact on her symptoms.   Her cardiac evaluation here today has included an ultrasound that was  normal, the stress test that was normal.   This is important as she has significant symptoms of exercise  intolerance, peripheral edema.   MEDICATIONS:  1. Lexapro 40.  2. Propranolol 160.  3. Lisinopril 5.  4. Etodolac 80.  5. Reglan 5.  6. Klonopin 4.  7. Omeprazole 40.  8. Trazodone 200.  9. Seroquel 100.  10.Zonisamide 200.   ALLERGIES:  NO KNOWN DRUG ALLERGIES.   SOCIAL HISTORY:  She is currently undergoing a separation.  She has  three children ages 67, 63 and 80, all of whom live at home.  She smokes  a pack and a half per day.  She does not use recreational drugs or  alcohol.   PHYSICAL EXAMINATION:  VITAL SIGNS:  Her blood pressure was 103/63,  pulse 67, weight 207.  GENERAL APPEARANCE:  She is a middle to younger Caucasian female  appearing her stated age of 81.  HEENT:  No icterus or xanthelasma.  NECK:  The neck veins were flat.  Carotids were brisk and full  bilaterally without bruits.  BACK:  Without kyphosis, scoliosis.  LUNGS:  Clear.  CARDIOVASCULAR:  Heart sounds were regular without murmurs or gallops.  ABDOMEN:  Soft with  active bowel sounds without midline pulsation or  hepatomegaly.  PERIPHERAL VASCULAR:  Femoral pulses were 2+, distal pulses were intact.  EXTREMITIES:  There was no clubbing, cyanosis, or edema.  NEUROLOGIC:  Grossly normal.  SKIN:  Warm and dry.   Electrocardiogram dated November 21, 2006, demonstrated sinus rhythm with  ventricular pre-excitation.  There was early R-wave transition with  upright Delta wave in lead V1.  There were negative Delta waves in lead  V3 and aVF and maybe a negative Delta wave in lead II, although it looks  to be iso-electric in its initial phase.   IMPRESSION:  1. Wolff-Parkinson-White syndrome with possible epicardial pathway.  2. Syncope associated with #1.   Sam, Ms. Dollens has significant symptoms associated with her Evelene Croon-  Parkinson-White syndrome including syncope.  We discussed treatment  options which would include the use of anti-arrhythmic drugs with their  potential for pro-arrhythmia as well as electrophysiology study with  catheter ablation.  We discussed the potential benefits as well as  potential risks including, but not limited to, death, perforation, heart  block, vascular injury.  She understands these risks and would like to  proceed.   We will plan to schedule that.   If her blood pressure remains elevated at that time, we will plan to  think about putting her on lisinopril/hydrochlorothiazide  at that time.   Thank you for the consultation.    Sincerely,      Duke Salvia, MD, [facility]  Electronically Signed    SCK/MedQ  DD: 01/04/2007  DT: 01/05/2007  Job #: [id number]   CC:   Stacie Acres. Cliffton Asters, M.D.  Sharolyn Douglas, M.D.

## 2010-10-19 NOTE — Assessment & Plan Note (Signed)
[facility]                         ELECTROPHYSIOLOGY OFFICE NOTE   NAME:PLAYERTate, Jerkins                     MRN:          [id number]  DATE:01/01/2008                            DOB:          1971/01/10    Mrs. Malinak is seen in followup for palpitations in the context of  having a previously antegrade-only-conducting accessory pathway ablated.  It is not clear whether these represent recurrent arrhythmia or not.  She is currently in the process of being evaluated with a external event  recorder.  Unfortunately, I was unable to access her data today.  I will  call her with the results of the aforementioned study.   I should note that her major complaint today is sleepiness.  She is  sleeping about 18 hours a day on her multiple narcoleptics, which are  being prescribed by [facility].  They include Seroquel, Cymbalta,  diclofenac, cyclobenzaprine, and __________.  Her blood pressure today  was 119/72, her pulse was 100.     Duke Salvia, MD, [facility]  Electronically Signed    SCK/MedQ  DD: 01/01/2008  DT: 01/02/2008  Job #: [id number]   cc:   Eulas Post [facility]

## 2010-10-19 NOTE — Discharge Summary (Signed)
NAMEBLAYRE, Adriana Hall              ACCOUNT NO.:  [id number]   MEDICAL RECORD NO.:  [id number]          PATIENT TYPE:  OIB   LOCATION:  6527                         FACILITY:  [facility]   PHYSICIAN:  Duke Salvia, MD, FACCDATE OF BIRTH:  June 22, 1970   DATE OF ADMISSION:  01/24/2007  DATE OF DISCHARGE:  01/25/2007                               DISCHARGE SUMMARY   ADDENDUM:   ALLERGIES:  This patient has no known drug allergies.   HOSPITAL COURSE:  This is addendum to her discharge dictated earlier  today.  She had been on propranolol 160 mg daily; I had thought that was  for migraine prophylaxis; it was actually for blood pressure control.  Her blood pressure off that when she came in -- she had been off this  medication for 3 days -- was 123 systolic. Blood pressure on the morning  of discharge was 113 systolic.  She would like to hold that and I agree,  so she will not be on propranolol 160 mg daily.  She will continue  lisinopril 5 mg daily.  When she comes to the office at [facility] on September 18, we will check an electrocardiogram to see if she  has been doing well after her ablation and we will also check her blood  pressure.  She may need addition of lisinopril/hydrochlorothiazide if  her blood pressure is elevated.      Maple Mirza, [place]      Duke Salvia, MD, [facility]  Electronically Signed    GM/MEDQ  D:  01/25/2007  T:  01/26/2007  Job:  [id number]   cc:   Stacie Acres. Cliffton Asters, M.D.  Sharolyn Douglas, M.D.

## 2010-10-19 NOTE — Assessment & Plan Note (Signed)
[facility]                            CARDIOLOGY OFFICE NOTE   JONISE, WEIGHTMAN                     MRN:          [id number]  DATE:07/20/2007                            DOB:          1971/02/09    PRIMARY CARE PHYSICIAN:  Laurann Montana, M.D.   HISTORY OF PRESENT ILLNESS:  A 40 year old Caucasian female with prior  history of Wolff-Parkinson-White syndrome, presents for surgical  clearance pending neck surgery.   PROBLEM LIST:  1. Wolff-Parkinson-White syndrome.      a.     Status post radio-frequency ablation January 24, 2007 for       antidromic supraventricular tachycardia.  2. History of atypical chest pain.      a.     December 25, 2006, Adenosine Myoview with ejection fraction of       52%. Normal stress nuclear study.  3. Migraine headaches.  4. History of hypertriglyceridemia.  5. Ongoing tobacco abuse.  6. Anxiety.  7. History of hypertension.  8. GERD.   HOSPITAL COURSE:  The patient is a 40 year old female with the above  problems list. She presents today for surgical clearance, as she is  pending neck surgery with Dr. Noel Gerold. She denies any recent chest pain,  shortness of breath, dyspnea on exertion, change in exercise tolerance,  or significant limitations and activities with the exception of her neck  pain. She is still smoking, about 4 cigarettes a day, down from 2 packs  a day. She is hoping that she will quit following her surgery, which is  scheduled for July 24, 2007.   HOME MEDICATIONS:  1. Trazadone 10 mg daily.  2. Calcitrate 1000 mg daily.  3. Geritol complete daily.  4. Frova 2.5 mg p.r.n.  5. Zanaflex 2 mg p.r.n.  6. Migranal spray 1 spray b.i.d.  7. Lavasa 2 tabs daily.  8. Lexapro 20 mg daily.  9. Xonisamide 20 mg q.h.s.  10.Omeprazole 40 mg daily.  11.Flonase 0.5% 2 sprays daily.  12.Lodine 40 mg b.i.d.   PHYSICAL EXAMINATION:  VITAL SIGNS:  Blood pressure 136/70, heart rate  87, respiratory rate 16.  Weight 197 pounds.  GENERAL:  Pleasant, white female. No acute distress. Awake and oriented  x3.  HEENT:  Normal.  NECK:  No bruits or JVD.  NEUROLOGIC:  Grossly intact, nonfocal.  LUNGS:  Respirations regular and unlabored. Clear to auscultation.  CARDIOVASCULAR:  Regular S1 and S2. No S3, S4, or murmurs.  ABDOMEN:  Round, soft, nontender, nondistended. Bowel sounds normal.  EXTREMITIES:  Warm without erythema. No clubbing, cyanosis, or edema.  Dorsalis, pedis, and posterior tibial pulses 1+ and equal bilaterally.   LABORATORY DATA:  EKG shows sinus rhythm with small inferolateral Q's,  without any acute ST or T changes.   ASSESSMENT/PLAN:  1. Neck pain, pending surgery. The patient is felt to be low risk from      a cardiac standpoint. Case was discussed with Dr. Dietrich Pates. She      did not require any additional cardiac evaluation at this time.  2. History of WPW and SVT, status post ablation.  She has had no      recurrence of the palpitations.  3. History of atypical chest pain, status post negative Myoview last      summer.  4. Anxiety and depression, followed by psychiatry and primary care      physician.  5. Tobacco abuse, smoking cessation strongly advised.   DISPOSITION:  Followup with Dr. Diona Browner or Dr. Graciela Husbands as needed.      Nicolasa Ducking, ANP  Electronically Signed      Pricilla Riffle, MD, [facility]  Electronically Signed   CB/MedQ  DD: 07/20/2007  DT: 07/22/2007  Job #: [id number]   cc:   Jonelle Sidle, MD  Duke Salvia, MD, [facility]  Sharolyn Douglas, M.D.

## 2010-10-19 NOTE — Assessment & Plan Note (Signed)
[facility]                            CARDIOLOGY OFFICE NOTE   NAME:Adriana Hall, Adriana Hall                     MRN:          [id number]  DATE:12/13/2006                            DOB:          1971-02-03    REASON FOR CONSULTATION:  Preoperative evaluation and recently diagnosed  Wolff-Parkinson-White syndrome based on electrocardiogram.   HISTORY OF PRESENT ILLNESS:  Adriana Hall is a pleasant 40 year old woman  who is being considered for an L4-5 fusion and laminectomy due to  degenerative disk disease and low back pain.  Additional history  includes anxiety, migraine headaches, hypertension, and gastroesophageal  reflux disease.  She had a baseline electrocardiogram (reportedly her  first) back on June 17th that shows delta waves consistent with Adriana Hall-  Parkinson-White syndrome.  Her repeat tracing today also shows similar  changes.  Historically, she reports experiencing a 5-6 year history of  intermittent orthostatic dizziness, as if she may faint, but initially  no specific palpitations.  More recently over the last 3-4 years, she  has experienced distinct episodes of rapid palpitations, which can  progress to visual dimness and a feeling as if she is going to pass out.  She reports that at times if she does not sit down and put her head  down, she does pass out.  She has had three episodes of syncope within  the last year.  She reports being treated for possible anxiety attacks.  In addition, to these symptoms, she has a sporadic left-sided chest  pain, sometimes when she is walking, sometimes at rest.  She reports  that she is occasionally able to make this better by twisting and  shifting her thorax.  She has had no other cardiovascular testing.  We  have been asked to evaluate her further.   ALLERGIES:  No known drug allergies.   CURRENT MEDICATIONS:  1. Levaza 2 gm two tablets daily.  2. Lexapro 40 mg 1 p.o. daily.  3. Propranolol 160 mg  p.o. daily.  4. Lisinopril 5 mg p.o. daily.  5. Etodolac 400 mg p.o. b.i.d.  6. Klonopin 4 mg p.o. daily.  7. Trazodone 200 mg p.o. nightly.  8. Seroquel 100 mg p.o. nightly.  9. Zonisamide 200 mg p.o. daily.  10.Reglan, Relpax, tramadol, Robaxin, diazepam, Vicodin p.r.n.   PAST MEDICAL HISTORY:  As outlined above.  Additional problems include  hypertriglyceridemia and also migraine headaches.  She reports taking  propranolol for her migraine headaches and also recently Zonisamide, as  outlined above.   SOCIAL HISTORY:  Patient is divorced.  She has three children.  She is  presently unemployed.  She has a 1-1/2 pack per day tobacco use history  for 15 years.  Denies any recreational drug use or alcohol use.  She  does admit to fairly significant caffeine use.  She is not exercising  regularly.   FAMILY HISTORY:  Significant for the death of the patient's father at  age 54 with a brain tumor.  Her mother apparently has some type of  cardiac disorder in her mid 1s (details not clear).   REVIEW OF SYSTEMS:  As described in the history of present illness.  She  has occasional fatigue.  She has chronic back pain.  She is bothered by  intermittent reflux, migraine headaches, and arthritic discomfort.   PHYSICAL EXAMINATION:  VITAL SIGNS:  Blood pressure 127/81, heart rate  85, weight 207 pounds.  GENERAL:  An overweight woman in no acute distress.  HEENT:  Normal.  NECK:  Supple without elevated jugular venous distention or loud bruits.  No thyromegaly is noted.  LUNGS:  Clear without labored breathing at rest.  CARDIAC:  Regular rate and rhythm.  No S3 gallop or loud murmur.  ABDOMEN:  Soft and nontender.  Normoactive bowel sounds.  EXTREMITIES:  No pitting edema.  Skin is warm and dry.  Distal pulses  are 2+.  MUSCULOSKELETAL:  No kyphosis is noted.  NEUROPSYCHIATRIC:  Patient is alert and oriented x3.  Affect is normal.   IMPRESSION/RECOMMENDATIONS:  1. Wolff-Parkinson-White  syndrome, based on resting electrocardiogram      and symptoms of rapid palpitations with occasional syncope.  She is      already on a beta blocker in the form of propranolol, although this      has been used for migraines.  At this point, I will not make any      specific change in her medication.  I reviewed the tracings and      history with Dr. Graciela Hall.  My plan at this point is a baseline      echocardiogram to assess cardiac structure and function as well as      an Adenosine Myoview for basic ischemic evaluation.  The patient's      description of chest pain is fairly atypical, however.  I would      like to have her come back to the office to see Dr. Graciela Hall formally      and review options for management of her Wolff-Parkinson-White      syndrome and at that time, he can also review these studies.  Her      surgery is presently on hold.  2. Further plans to follow.     Jonelle Sidle, MD  Electronically Signed    SGM/MedQ  DD: 12/13/2006  DT: 12/14/2006  Job #: [id number]   cc:   Adriana Hall, M.D.  Adriana Hall, M.D.

## 2010-10-19 NOTE — Assessment & Plan Note (Signed)
[facility]                                 ON-CALL NOTE   NAME:[name], Adriana Hall                       MRN:          [id number]  DATE:12/30/2006                            DOB:          May 19, 1971    CARDIOLOGY SERVICE CALL NOTE.   PHONE NUMBER:  [phone].   CARDIOLOGIST:  Dr. Simona Huh.   HISTORY:  Ms. Backs is a 40 year old female patient initially seen by  Dr. Diona Browner recently with a history of WPW and chest pain.  She was set  up for an echocardiogram which I just looked up and is normal.  She was  also set up for an Adenosine Myoview study.  The results of that are not  available.  The patient saw her psychiatrist recently.  Her Lexapro and  trazodone were both discontinued and she was placed on Cymbalta,  Neurontin and Klonopin.  She has been feeling quite lethargic and is  complaining of chest pain.  She notes that she always has chest pain but  her chest pain is worse today.  She also notes shortness of breath.  She  denies any recent syncope.  She has noted some palpitations.   PLAN:  I explained to [name] that it would be better for her to be  evaluated in the emergency room today.  She agrees to go to [facility] Emergency Room now.  I would prefer that the ER physician  evaluate her and if she has a cardiac etiology to her symptoms our  service will be contacted.   DISPOSITION:  As noted above.     Tereso Newcomer, PA-C  Electronically Signed    SW/MedQ  DD: 12/30/2006  DT: 12/31/2006  Job #: [id number]
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
## 2010-11-18 ENCOUNTER — Other Ambulatory Visit: Payer: Self-pay | Admitting: *Deleted

## 2010-11-18 MED ORDER — ATENOLOL 50 MG PO TABS: 50.0000 mg | ORAL_TABLET | Freq: Every day | ORAL | Status: DC

## 2010-12-19 ENCOUNTER — Emergency Department (HOSPITAL_COMMUNITY)
Admission: EM | Admit: 2010-12-19 | Discharge: 2010-12-19 | Disposition: A | Discharge status: Home / Self Care | Payer: Self-pay | Attending: Emergency Medicine | Admitting: Emergency Medicine

## 2010-12-19 DIAGNOSIS — K219 Gastro-esophageal reflux disease without esophagitis: Secondary | ICD-10-CM | POA: Insufficient documentation

## 2010-12-19 DIAGNOSIS — H5710 Ocular pain, unspecified eye: Secondary | ICD-10-CM | POA: Insufficient documentation

## 2010-12-19 DIAGNOSIS — H539 Unspecified visual disturbance: Secondary | ICD-10-CM | POA: Insufficient documentation

## 2010-12-19 DIAGNOSIS — R110 Nausea: Secondary | ICD-10-CM | POA: Insufficient documentation

## 2010-12-19 DIAGNOSIS — I456 Pre-excitation syndrome: Secondary | ICD-10-CM | POA: Insufficient documentation

## 2010-12-19 DIAGNOSIS — G43909 Migraine, unspecified, not intractable, without status migrainosus: Secondary | ICD-10-CM | POA: Insufficient documentation

## 2010-12-19 DIAGNOSIS — H53149 Visual discomfort, unspecified: Secondary | ICD-10-CM | POA: Insufficient documentation

## 2011-01-23 ENCOUNTER — Emergency Department (HOSPITAL_COMMUNITY): Payer: Self-pay

## 2011-01-23 ENCOUNTER — Emergency Department (HOSPITAL_COMMUNITY)
Admission: EM | Admit: 2011-01-23 | Discharge: 2011-01-23 | Disposition: A | Discharge status: Home / Self Care | Payer: Self-pay | Attending: Emergency Medicine | Admitting: Emergency Medicine

## 2011-01-23 DIAGNOSIS — Z9889 Other specified postprocedural states: Secondary | ICD-10-CM | POA: Insufficient documentation

## 2011-01-23 DIAGNOSIS — K219 Gastro-esophageal reflux disease without esophagitis: Secondary | ICD-10-CM | POA: Insufficient documentation

## 2011-01-23 DIAGNOSIS — Z79899 Other long term (current) drug therapy: Secondary | ICD-10-CM | POA: Insufficient documentation

## 2011-01-23 DIAGNOSIS — F341 Dysthymic disorder: Secondary | ICD-10-CM | POA: Insufficient documentation

## 2011-01-23 DIAGNOSIS — R112 Nausea with vomiting, unspecified: Secondary | ICD-10-CM | POA: Insufficient documentation

## 2011-01-23 DIAGNOSIS — R10811 Right upper quadrant abdominal tenderness: Secondary | ICD-10-CM | POA: Insufficient documentation

## 2011-01-23 DIAGNOSIS — R197 Diarrhea, unspecified: Secondary | ICD-10-CM | POA: Insufficient documentation

## 2011-01-23 DIAGNOSIS — R1011 Right upper quadrant pain: Secondary | ICD-10-CM | POA: Insufficient documentation

## 2011-01-23 DIAGNOSIS — I456 Pre-excitation syndrome: Secondary | ICD-10-CM | POA: Insufficient documentation

## 2011-01-23 LAB — URINALYSIS, ROUTINE W REFLEX MICROSCOPIC
Bilirubin Urine: NEGATIVE
Glucose, UA: NEGATIVE mg/dL
Hgb urine dipstick: NEGATIVE
Protein, ur: NEGATIVE mg/dL
Specific Gravity, Urine: 1.014 (ref 1.005–1.030)
Urobilinogen, UA: 0.2 mg/dL (ref 0.0–1.0)

## 2011-01-23 LAB — BASIC METABOLIC PANEL
BUN: 7 mg/dL (ref 6–23)
Chloride: 108 mEq/L (ref 96–112)
Creatinine, Ser: 0.65 mg/dL (ref 0.50–1.10)
GFR calc Af Amer: >60 mL/min (ref >60)
Glucose, Bld: 97 mg/dL (ref 70–99)

## 2011-01-23 LAB — DIFFERENTIAL
Eosinophils Relative: 3 % (ref 0–5)
Lymphocytes Relative: 27 % (ref 12–46)
Lymphs Abs: 3.6 10*3/uL (ref 0.7–4.0)
Monocytes Absolute: 1.4 10*3/uL — ABNORMAL HIGH (ref 0.1–1.0)
Monocytes Relative: 10 % (ref 3–12)

## 2011-01-23 LAB — HEPATIC FUNCTION PANEL
ALT: 9 U/L (ref 0–35)
AST: 10 U/L (ref 0–37)
Albumin: 3.6 g/dL (ref 3.5–5.2)
Bilirubin, Direct: <0.1 mg/dL (ref 0.0–0.3)

## 2011-01-23 LAB — CBC
HCT: 45.6 % (ref 36.0–46.0)
MCH: 33.2 pg (ref 26.0–34.0)
MCHC: 36 g/dL (ref 30.0–36.0)
MCV: 92.3 fL (ref 78.0–100.0)
RDW: 14.1 % (ref 11.5–15.5)
WBC: 13.3 10*3/uL — ABNORMAL HIGH (ref 4.0–10.5)

## 2011-01-23 LAB — URINE MICROSCOPIC-ADD ON

## 2011-02-22 ENCOUNTER — Telehealth: Payer: Self-pay | Admitting: Internal Medicine

## 2011-02-23 NOTE — Telephone Encounter (Signed)
Pt scheduled to see Willette Cluster NP 02/25/11 @10am . Alcario Drought to notify pt of appt date and time and fax records.

## 2011-02-25 ENCOUNTER — Ambulatory Visit (INDEPENDENT_AMBULATORY_CARE_PROVIDER_SITE_OTHER): Payer: Self-pay | Admitting: Nurse Practitioner

## 2011-02-25 ENCOUNTER — Encounter: Payer: Self-pay | Admitting: Nurse Practitioner

## 2011-02-25 VITALS — BP 110/70 | HR 68 | Ht 65.0 in | Wt 185.0 lb

## 2011-02-25 DIAGNOSIS — R109 Unspecified abdominal pain: Secondary | ICD-10-CM

## 2011-02-25 DIAGNOSIS — R194 Change in bowel habit: Secondary | ICD-10-CM

## 2011-02-25 DIAGNOSIS — R198 Other specified symptoms and signs involving the digestive system and abdomen: Secondary | ICD-10-CM

## 2011-02-25 DIAGNOSIS — R1030 Lower abdominal pain, unspecified: Secondary | ICD-10-CM

## 2011-02-25 DIAGNOSIS — K219 Gastro-esophageal reflux disease without esophagitis: Secondary | ICD-10-CM

## 2011-02-25 DIAGNOSIS — F17200 Nicotine dependence, unspecified, uncomplicated: Secondary | ICD-10-CM

## 2011-02-25 NOTE — Progress Notes (Signed)
02/25/2011 Adriana Hall [id number] Nov 21, 1970   HISTORY OF PRESENT ILLNESS: [name] is a 40 year old female referred by her primary care doctor for evaluation of several gastrointestinal problems. The patient began having problems with constipation about four years ago. In July of this year she began having nausea and diarrhea. Around this time her mother was ill and she was also trying to plan her wedding. Initially her symptoms were felt to be secondary to stress. There were days patient was having 15 or so loose stools a day. A week before her wedding patient passed out and was takento the emergency department for evaluation. Labs revealed white count of13.3, hemoglobin 16.4. Liver function studies, electrolytes and renal function were all normal . Urinalysis positive for a small amount of leukocytes but otherwise unremarkable. Ultrasound of the abdomen was negative. Patient was told she had an infection, was given Cipro and released.  She continued to have diarrhea despite course of Cipro. Patient followed up with her primary care physician in mid August at which time stool studies were obtained and negative.Since last week patient has alternated between constipation and diarrhea and is now having diffuse lower abdominal pain after meals. She complains of bloating, excessive belching and flatulence. No medication changes since onset of bowel changes. No antibiotics in last few months. Sometimes there is blood in her stool after numerous episodes of diarrhea.  Patient gives a long-standing history of GERD for which she takes daily Prilosec.  She still gets heartburn despite Prilosec. She's never had screening for Barrett's esophagus.    Past Medical History  Diagnosis Date  . Anxiety     10 years  . Arrhythmia     5 years  . Headache     Chronic for 30 yrs  . Depression     10 years  . Hypertension    Past Surgical History  Procedure Date  . Cardiac valve surgery 2007    reports that she has been smoking Cigarettes.  She has never used smokeless tobacco. She reports that she drinks alcohol. She reports that she does not use illicit drugs. family history includes Clotting disorder in her mother; Diabetes in her paternal grandmother; Heart disease in her mother; Liver cancer in her maternal grandfather; Other in her father; and Pancreatic cancer in her maternal grandfather.  There is no history of Colon cancer. No Known Allergies    Outpatient Encounter Prescriptions as of 02/25/2011  Medication Sig Dispense Refill  . atenolol (TENORMIN) 50 MG tablet Take 1 tablet (50 mg total) by mouth daily.  30 tablet  2  . divalproex (DEPAKOTE) 500 MG 24 hr tablet Take 500 mg by mouth daily.        Marland Kitchen escitalopram (LEXAPRO) 10 MG tablet Take 10 mg by mouth daily.        Marland Kitchen topiramate (TOPAMAX) 25 MG tablet 25 mg. 3 tablets po qhs       . traZODone (DESYREL) 100 MG tablet Take 100 mg by mouth at bedtime.        . DULoxetine (CYMBALTA) 60 MG capsule Take 60 mg by mouth daily.        Marland Kitchen omeprazole (PRILOSEC) 20 MG capsule Take 20 mg by mouth daily.           REVIEW OF SYSTEMS  : Positive for weight loss of approximately 10 pounds, pulse back pain , fatigue , hearing problems, night sweats, sleeping problems, excessive thirst , confusion , fevers. All other systems reviewed and negative  except where noted in the History of Present Illness.   PHYSICAL EXAM: General: Well developed white female in no acute distress Head: Normocephalic and atraumatic Eyes:  sclerae anicteric,conjunctive pink. Ears: Normal auditory acuity Mouth: No deformity or lesions Neck: Supple, no masses.  Lungs: Clear throughout to auscultation Heart: Regular rate and rhythm; no murmurs heard Abdomen: Soft, non distended, nontender. No masses or hepatomegaly noted. Normal Bowel sounds Rectal:  Soft, nondistended, mild diffuse lower abdominal tenderness. No masses felt. Active bowel sounds. Musculoskeletal:  Symmetrical with no gross deformities  Skin: No lesions on visible extremities Extremities: No edema or deformities noted Neurological: Alert oriented x 4, grossly nonfocal Cervical Nodes:  No significant cervical adenopathy Psychological:  Alert and cooperative. Normal mood and affect  ASSESSMENT AND PLAN;

## 2011-02-25 NOTE — Patient Instructions (Addendum)
You have been given a separate informational sheet regarding your tobacco use, the importance of quitting and local resources to help you quit.  We have scheduled the colonoscopy w/ Propofol with Dr. Melvia Heaps. We have given you a sample of Suprep for the  Colonoscopy prep.   We gave you samples of Prilosec OTC, Take 1 capsule 30 min before breakfast.

## 2011-02-26 NOTE — Assessment & Plan Note (Addendum)
Nausea and altered bowel habits since July, predominantly diarrhea with up to fifteen loose stools a day. Now with associated postprandial lower abdominal cramping, bloating, and excessive gas. Stool studies negative at primary care doctor's office. Differential diagnoses include, but are not limited to irritable bowel syndrome, inflammatory bowel disease, celiac disease. Microscopic colitis possible though it isn't usually associated with abdominal pain. For further evaluation the patient will be scheduled for colonoscopy with Propofol. The risks, benefits, and alternatives to colonoscopy with possible biopsy and possible polypectomy were discussed with the patient and they consent to proceed.

## 2011-02-26 NOTE — Assessment & Plan Note (Signed)
Please refer to "bowel change".

## 2011-02-26 NOTE — Assessment & Plan Note (Signed)
Breakthrough pyrosis on daily PPI. Will increase PPI to twice daily before meals. Patient has no prescription coverage, samples given . Anti-reflux measures discussed. If no improvement in symptoms, patient may eventually need upper endoscopy for further evaluation

## 2011-02-26 NOTE — Assessment & Plan Note (Signed)
Discussed importance of cessation  

## 2011-02-28 NOTE — Progress Notes (Signed)
Reviewed and agree with management. Demaree Liberto D. Kayhan Boardley, M.D., FACG  

## 2011-03-01 ENCOUNTER — Encounter: Payer: Self-pay | Admitting: Gastroenterology

## 2011-03-01 ENCOUNTER — Ambulatory Visit (AMBULATORY_SURGERY_CENTER): Payer: Self-pay | Admitting: Gastroenterology

## 2011-03-01 DIAGNOSIS — R198 Other specified symptoms and signs involving the digestive system and abdomen: Secondary | ICD-10-CM

## 2011-03-01 DIAGNOSIS — K529 Noninfective gastroenteritis and colitis, unspecified: Secondary | ICD-10-CM

## 2011-03-01 DIAGNOSIS — D126 Benign neoplasm of colon, unspecified: Secondary | ICD-10-CM

## 2011-03-01 DIAGNOSIS — R197 Diarrhea, unspecified: Secondary | ICD-10-CM

## 2011-03-01 DIAGNOSIS — R109 Unspecified abdominal pain: Secondary | ICD-10-CM

## 2011-03-01 HISTORY — DX: Noninfective gastroenteritis and colitis, unspecified: K52.9

## 2011-03-01 MED ORDER — HYOSCYAMINE SULFATE ER 0.375 MG PO TBCR: EXTENDED_RELEASE_TABLET | ORAL | Status: DC

## 2011-03-01 MED ORDER — SODIUM CHLORIDE 0.9 % IV SOLN: 500.0000 mL | INTRAVENOUS | Status: DC

## 2011-03-01 NOTE — Progress Notes (Signed)
Pt was sedated with propofol by Brennan Bailey CRNA.  Maw  Pt tolerated the colon exam very well. maw

## 2011-03-01 NOTE — Patient Instructions (Signed)
Please review discharge instructions (blue and green sheets)  Avoid Aspirin and Nsaids (anti-inflammatory) medicines for 2 weeks  Await biopsy results  High fiber diet  Please call office in the next 1-3 days to schedule a follow up visit for 3 weeks

## 2011-03-02 ENCOUNTER — Telehealth: Payer: Self-pay | Admitting: *Deleted

## 2011-03-02 NOTE — Telephone Encounter (Signed)
No answer. Message left to call if needed. 

## 2011-03-03 LAB — URINALYSIS, ROUTINE W REFLEX MICROSCOPIC
Bilirubin Urine: NEGATIVE
Bilirubin Urine: NEGATIVE
Glucose, UA: NEGATIVE
Hgb urine dipstick: NEGATIVE
Hgb urine dipstick: NEGATIVE
Ketones, ur: NEGATIVE
Nitrite: NEGATIVE
Specific Gravity, Urine: 1.011
Specific Gravity, Urine: 1.008
Urobilinogen, UA: 0.2
pH: 6.5
pH: 6.5

## 2011-03-03 LAB — CLOSTRIDIUM DIFFICILE EIA: C difficile Toxins A+B, EIA: NEGATIVE

## 2011-03-03 LAB — COMPREHENSIVE METABOLIC PANEL
AST: 16
Albumin: 3.6
Alkaline Phosphatase: 75
BUN: 7
CO2: 24
Calcium: 8.8
Chloride: 105
Creatinine, Ser: 0.99
GFR calc Af Amer: >60
GFR calc non Af Amer: >60
Glucose, Bld: 88
Potassium: 3.8
Sodium: 137
Total Bilirubin: 0.6
Total Protein: 6.2
Total Protein: 5.9 — ABNORMAL LOW

## 2011-03-03 LAB — DIFFERENTIAL
Basophils Absolute: 0.3 — ABNORMAL HIGH
Basophils Relative: 2 — ABNORMAL HIGH
Eosinophils Absolute: 0.3
Eosinophils Relative: 2
Lymphocytes Relative: 24
Lymphocytes Relative: 25
Lymphs Abs: 3
Monocytes Absolute: 1.3 — ABNORMAL HIGH
Monocytes Relative: 11
Monocytes Relative: 10
Neutro Abs: 7.1
Neutro Abs: 7.3
Neutrophils Relative %: 61

## 2011-03-03 LAB — CBC
Hemoglobin: 15.5 — ABNORMAL HIGH
MCHC: 34.2
MCV: 93.4
Platelets: 260
RBC: 4.86
RDW: 14.6
RDW: 14.6
WBC: 11.8 — ABNORMAL HIGH

## 2011-03-03 LAB — PREGNANCY, URINE: Preg Test, Ur: NEGATIVE

## 2011-03-03 LAB — GIARDIA/CRYPTOSPORIDIUM SCREEN(EIA)
Cryptosporidium Screen (EIA): NEGATIVE
Giardia Screen - EIA: NEGATIVE

## 2011-03-03 LAB — STOOL CULTURE

## 2011-03-03 LAB — URINE MICROSCOPIC-ADD ON

## 2011-03-14 LAB — POCT RAPID STREP A: Streptococcus, Group A Screen (Direct): NEGATIVE

## 2011-03-15 ENCOUNTER — Ambulatory Visit (INDEPENDENT_AMBULATORY_CARE_PROVIDER_SITE_OTHER): Payer: Self-pay | Admitting: Gastroenterology

## 2011-03-15 ENCOUNTER — Encounter: Payer: Self-pay | Admitting: Gastroenterology

## 2011-03-15 ENCOUNTER — Telehealth: Payer: Self-pay | Admitting: Gastroenterology

## 2011-03-15 DIAGNOSIS — R109 Unspecified abdominal pain: Secondary | ICD-10-CM

## 2011-03-15 DIAGNOSIS — R194 Change in bowel habit: Secondary | ICD-10-CM

## 2011-03-15 DIAGNOSIS — R198 Other specified symptoms and signs involving the digestive system and abdomen: Secondary | ICD-10-CM

## 2011-03-15 MED ORDER — DICYCLOMINE HCL 10 MG PO CAPS: ORAL_CAPSULE | ORAL | Status: DC

## 2011-03-15 NOTE — Assessment & Plan Note (Addendum)
Patient remains symptomatic with nausea, abdominal pain and diarrhea. There were suggestions of inflammation in the left colon by colonoscopy though biopsies were negative. I believe this still remains a possibility.  Recommendations #1 trial lialda 2.4 g daily #2 CT of the abdomen and pelvis #3 begin Bentyl 10 mg 4 times a day when necessary; discontinue hyomax

## 2011-03-15 NOTE — Patient Instructions (Addendum)
  You have been scheduled for a CT scan of the abdomen and pelvis at [facility] ([address]---this is in the same building as [facility]).   You are scheduled on 03/18/2011 at 10am. You should arrive 15 minutes prior to your appointment time for registration. Please follow the written instructions below on the day of your exam:  WARNING: IF YOU ARE ALLERGIC TO IODINE/X-RAY DYE, PLEASE NOTIFY RADIOLOGY IMMEDIATELY AT [phone]! YOU WILL BE GIVEN A 13 HOUR PREMEDICATION PREP.  1) Do not eat or drink anything after 6am (4 hours prior to your test) 2) You have been given 2 bottles of oral contrast to drink. The solution may taste               better if refrigerated, but do NOT add ice or any other liquid to this solution. Shake             well before drinking.    Drink 1 bottle of contrast @ 8am (2 hours prior to your exam)  Drink 1 bottle of contrast @ 9am (1 hour prior to your exam)  You may take any medications as prescribed with a small amount of water except for the following: Metformin, Glucophage, Glucovance, Avandamet, Riomet, Fortamet, Actoplus Met, Janumet, Glumetza or Metaglip. The above medications must be held the day of the exam AND 48  hours after the exam.  The purpose of you drinking the oral contrast is to aid in the visualization of your intestinal tract. The contrast solution may cause some diarrhea. Before your exam is started, you will be given a small amount of fluid to drink. Depending on your individual set of symptoms, you may also receive an intravenous injection of x-ray contrast/dye. Plan on being at [facility] for 30 minutes or long, depending on the type of exam you are having performed.  If you have any questions regarding your exam or if you need to reschedule, you may call the CT department at [phone] between the hours of 8:00 am and 5:00 pm, Monday-Friday.  Keep your follow up appointment with Dr Arlyce Dice for 03/29/2011 at  3:30pm We have given you Lialda samples today- Take 2 by mouth once daily ________________________________________________________________________

## 2011-03-15 NOTE — Progress Notes (Signed)
History of Present Illness:  [name] has returneded for  evaluation of diarrhea and pain. Colonoscopy demonstrated what appeared to be mild erythema in the left colon. Biopsies of the left colon and the more proximal colon were unremarkable, however. She continues to complain of severe crampy abdominal pain with multiple episodes of loose, watery stool both in the day and at night. She relates that she took antibiotics initially  when symptoms appeared several months ago. This was followed by several stool samples which were negative for C. difficile toxin. Stool cultures and O&P have been negative. She's lost about 10 pounds. She also has immediate postprandial nausea and abdominal pain. Medications have been stable.    Review of Systems: Pertinent positive and negative review of systems were noted in the above HPI section. All other review of systems were otherwise negative.    Current Medications, Allergies, Past Medical History, Past Surgical History, Family History and Social History were reviewed in [employer] electronic medical record  Vital signs were reviewed in today's medical record. Physical Exam: General: Well developed , well nourished, no acute distress Head: Normocephalic and atraumatic Eyes:  sclerae anicteric, EOMI Ears: Normal auditory acuity Mouth: No deformity or lesions Lungs: Clear throughout to auscultation Heart: Regular rate and rhythm; no murmurs, rubs or bruits Abdomen: Soft,non distended. No masses, hepatosplenomegaly or hernias noted. Normal Bowel sounds. There is mild tenderness in the periumbilical region Rectal:deferred Musculoskeletal: Symmetrical with no gross deformities  Pulses:  Normal pulses noted Extremities: No clubbing, cyanosis, edema or deformities noted Neurological: Alert oriented x 4, grossly nonfocal Psychological:  Alert and cooperative. Normal mood and affect

## 2011-03-15 NOTE — Telephone Encounter (Signed)
Pt states she has still been having problems with nausea and diarrhea. States the diarrhea has been really bad that last 2 days. Pt also states that the medication she was given for cramping is not working.Pt scheduled to see Dr. Arlyce Dice today at 1:45pm. Pt aware of appt date and time.

## 2011-03-16 ENCOUNTER — Telehealth: Payer: Self-pay | Admitting: Gastroenterology

## 2011-03-16 MED ORDER — GLYCOPYRROLATE 2 MG PO TABS: 2.0000 mg | ORAL_TABLET | Freq: Three times a day (TID) | ORAL | Status: DC

## 2011-03-16 MED ORDER — ONDANSETRON HCL 4 MG PO TABS: ORAL_TABLET | ORAL | Status: DC

## 2011-03-16 NOTE — Telephone Encounter (Signed)
Pt aware, prescriptions sent to the pharmacy.

## 2011-03-16 NOTE — Telephone Encounter (Signed)
Pt states she saw Dr. Arlyce Dice yesterday and was given Bentyl..........states this is not helping. States she is still having a lot of abdominal pain and diarrhea and nausea. Pt wanted to know if she could take immodium for diarrhea. Pt instructed that it is ok for her to take the immodium.  Pt is requesting something else for the cramping and pain Pt is requesting something for nausea  Please advise.

## 2011-03-16 NOTE — Telephone Encounter (Signed)
Try Robinul forte 2 mg tabs-take one tab every 8-12 hours as needed Zofran 4 mg every 6 hours as needed for nausea

## 2011-03-18 ENCOUNTER — Emergency Department (HOSPITAL_COMMUNITY)
Admission: EM | Admit: 2011-03-18 | Discharge: 2011-03-18 | Discharge status: Against Medical Advice | Payer: Self-pay | Attending: Emergency Medicine | Admitting: Emergency Medicine

## 2011-03-18 ENCOUNTER — Ambulatory Visit (INDEPENDENT_AMBULATORY_CARE_PROVIDER_SITE_OTHER)
Admission: RE | Admit: 2011-03-18 | Discharge: 2011-03-18 | Disposition: A | Discharge status: Home / Self Care | Payer: Self-pay | Source: Ambulatory Visit | Attending: Gastroenterology | Admitting: Gastroenterology

## 2011-03-18 DIAGNOSIS — R109 Unspecified abdominal pain: Secondary | ICD-10-CM | POA: Insufficient documentation

## 2011-03-18 DIAGNOSIS — R197 Diarrhea, unspecified: Secondary | ICD-10-CM | POA: Insufficient documentation

## 2011-03-18 DIAGNOSIS — R1110 Vomiting, unspecified: Secondary | ICD-10-CM | POA: Insufficient documentation

## 2011-03-18 DIAGNOSIS — R55 Syncope and collapse: Secondary | ICD-10-CM | POA: Insufficient documentation

## 2011-03-18 LAB — HCG, SERUM, QUALITATIVE: Preg, Serum: NEGATIVE

## 2011-03-18 MED ORDER — IOHEXOL 300 MG/ML  SOLN
100.0000 mL | Freq: Once | INTRAMUSCULAR | Status: AC | PRN
  Administered 2011-03-18: 100 mL via INTRAVENOUS

## 2011-03-21 LAB — I-STAT 8, (EC8 V) (CONVERTED LAB)
Chloride: 108
Glucose, Bld: 106 — ABNORMAL HIGH
Potassium: 3.9
pCO2, Ven: 32.9 — ABNORMAL LOW
pH, Ven: 7.433 — ABNORMAL HIGH

## 2011-03-21 LAB — POCT CARDIAC MARKERS
CKMB, poc: <1 — ABNORMAL LOW
Myoglobin, poc: 35.6
Operator id: 151321
Operator id: 151321

## 2011-03-21 LAB — POCT I-STAT CREATININE: Operator id: 151321

## 2011-03-23 LAB — DIFFERENTIAL
Basophils Absolute: 0
Eosinophils Relative: 2
Lymphocytes Relative: 30
Neutro Abs: 7.6

## 2011-03-23 LAB — CBC
HCT: 50.2 — ABNORMAL HIGH
Hemoglobin: 17.1 — ABNORMAL HIGH
MCV: 94.8
RBC: 5.3 — ABNORMAL HIGH
WBC: 13.2 — ABNORMAL HIGH

## 2011-03-23 LAB — PROTIME-INR: Prothrombin Time: 13.4

## 2011-03-23 LAB — URINE CULTURE
Colony Count: NO GROWTH
Culture: NO GROWTH

## 2011-03-23 LAB — URINALYSIS, ROUTINE W REFLEX MICROSCOPIC
Glucose, UA: NEGATIVE
Ketones, ur: NEGATIVE
Nitrite: NEGATIVE
Specific Gravity, Urine: 1.008 (ref 1.005–1.035)
Urobilinogen, UA: 0.2

## 2011-03-23 LAB — COMPREHENSIVE METABOLIC PANEL
AST: 19
BUN: 5 — ABNORMAL LOW
CO2: 27
Chloride: 107
Creatinine, Ser: 0.79
GFR calc non Af Amer: >60
Glucose, Bld: 98
Total Bilirubin: 0.7

## 2011-03-23 LAB — ABO/RH: ABO/RH(D): O POS

## 2011-03-23 LAB — TYPE AND SCREEN: ABO/RH(D): O POS

## 2011-03-24 ENCOUNTER — Emergency Department (HOSPITAL_COMMUNITY)
Admission: EM | Admit: 2011-03-24 | Discharge: 2011-03-25 | Discharge status: Against Medical Advice | Payer: Self-pay | Attending: Emergency Medicine | Admitting: Emergency Medicine

## 2011-03-24 ENCOUNTER — Telehealth: Payer: Self-pay | Admitting: Internal Medicine

## 2011-03-24 DIAGNOSIS — R109 Unspecified abdominal pain: Secondary | ICD-10-CM | POA: Insufficient documentation

## 2011-03-24 NOTE — Telephone Encounter (Signed)
Patient's husband called on route to ER. States his wife is having severe N,V, and abdominal pain. Recently seen by Dr. Arlyce Dice. Review of medical record shows that she has been having intermittent problems for months. Has had fairly extensive GI workup (CT,US, Colonoscopy, Labs) without cause found. I agreed with their going to ER for evaluation and management.

## 2011-03-29 ENCOUNTER — Encounter: Payer: Self-pay | Admitting: Gastroenterology

## 2011-03-29 ENCOUNTER — Other Ambulatory Visit (INDEPENDENT_AMBULATORY_CARE_PROVIDER_SITE_OTHER): Payer: Self-pay

## 2011-03-29 ENCOUNTER — Ambulatory Visit (INDEPENDENT_AMBULATORY_CARE_PROVIDER_SITE_OTHER): Payer: Self-pay | Admitting: Gastroenterology

## 2011-03-29 VITALS — BP 108/60 | HR 68 | Ht 65.0 in | Wt 181.0 lb

## 2011-03-29 DIAGNOSIS — R198 Other specified symptoms and signs involving the digestive system and abdomen: Secondary | ICD-10-CM

## 2011-03-29 DIAGNOSIS — R194 Change in bowel habit: Secondary | ICD-10-CM

## 2011-03-29 LAB — CBC WITH DIFFERENTIAL/PLATELET
Basophils Absolute: 0.1 10*3/uL (ref 0.0–0.1)
Eosinophils Relative: 2.3 % (ref 0.0–5.0)
Lymphocytes Relative: 25.6 % (ref 12.0–46.0)
Lymphs Abs: 2.6 10*3/uL (ref 0.7–4.0)
Monocytes Relative: 12.3 % — ABNORMAL HIGH (ref 3.0–12.0)
Neutrophils Relative %: 59.2 % (ref 43.0–77.0)
Platelets: 237 10*3/uL (ref 150.0–400.0)
RDW: 14.1 % (ref 11.5–14.6)
WBC: 10.1 10*3/uL (ref 4.5–10.5)

## 2011-03-29 LAB — COMPREHENSIVE METABOLIC PANEL
ALT: 17 U/L (ref 0–35)
Albumin: 3.6 g/dL (ref 3.5–5.2)
BUN: 5 mg/dL — ABNORMAL LOW (ref 6–23)
CO2: 23 mEq/L (ref 19–32)
Calcium: 8.7 mg/dL (ref 8.4–10.5)
Chloride: 109 mEq/L (ref 96–112)
Creatinine, Ser: 0.7 mg/dL (ref 0.4–1.2)
GFR: 97.31 mL/min (ref >60.00)

## 2011-03-29 LAB — HIGH SENSITIVITY CRP: CRP, High Sensitivity: 4.01 mg/L (ref 0.000–5.000)

## 2011-03-29 MED ORDER — HYDROCORTISONE ACETATE 25 MG RE SUPP: 25.0000 mg | Freq: Two times a day (BID) | RECTAL | Status: DC

## 2011-03-29 MED ORDER — METRONIDAZOLE 500 MG PO TABS: 500.0000 mg | ORAL_TABLET | Freq: Three times a day (TID) | ORAL | Status: AC

## 2011-03-29 NOTE — Assessment & Plan Note (Signed)
I remain suspicious that she has a colitis that is responsible for diarrhea and pain. Infection from a parasite such as Giardia is still possible.  Recommendations #1 empiric trial of Flagyl 500 mg by mouth every 8 H.; discontinue lialda #2 patient was carefully instructed to call back in 3 days; if not improved I will do a sigmoidoscopy

## 2011-03-29 NOTE — Progress Notes (Signed)
Adriana Hall has returned for followup of abdominal pain and diarrhea. Symptoms continue. She has multiple loose stools with severe urgency throughout the day and night. She has passed small amounts of blood with mucus. She may pass liquid only. Colonoscopy suggested left-sided inflammation but biopsies were negative. Nonetheless she was placed on lialda but is without any improvement. She  received Cipro in August. She is weak. She complains of moderately severe lower abdominal pain.  CT scan was negative.  On exam she is a slightly ill-appearing female there is mild tenderness in the periumbilical area without guarding or rebound. There are no dominant masses organomegaly

## 2011-03-29 NOTE — Patient Instructions (Signed)
You will go to the basement for labs today You will follow up with Dr  Arlyce Dice on 04/13/2011 at 11:15pm

## 2011-04-13 ENCOUNTER — Ambulatory Visit: Payer: Self-pay | Admitting: Gastroenterology

## 2012-03-08 ENCOUNTER — Telehealth: Payer: Self-pay | Admitting: Gastroenterology

## 2012-03-08 ENCOUNTER — Encounter: Payer: Self-pay | Admitting: Gastroenterology

## 2012-03-08 ENCOUNTER — Ambulatory Visit (INDEPENDENT_AMBULATORY_CARE_PROVIDER_SITE_OTHER): Payer: Self-pay | Admitting: Gastroenterology

## 2012-03-08 VITALS — BP 116/68 | HR 68 | Ht 64.5 in | Wt 176.4 lb

## 2012-03-08 DIAGNOSIS — R198 Other specified symptoms and signs involving the digestive system and abdomen: Secondary | ICD-10-CM

## 2012-03-08 DIAGNOSIS — R194 Change in bowel habit: Secondary | ICD-10-CM

## 2012-03-08 NOTE — Progress Notes (Signed)
History of Present Illness: Adriana Hall has returned for reevaluation of abdominal pain, diarrhea and constipation. Approximately a year ago she underwent colonoscopy for similar symptoms. Exam suggested a left-sided colitis but biopsies were negative. She was treated empirically with lialda without much improvement. Symptoms have continued but worsened over the past 2 months. She may go up to a week without a bowel movement. This is accompanied by urgency to have a bowel movement but she is unable to do so. She may have severe crampy lower bowel pain along with this. After a week she may have up to 7 days of severe watery diarrhea up to 8 times a day and night. She occasionally has seen blood on the toilet tissue or staining her underclothes. Her abdomen remains sore.    Past Medical History  Diagnosis Date  . Anxiety     10 years  . Arrhythmia     5 years  . Headache     Chronic for 30 yrs  . Depression     10 years  . Hypertension   . Colitis 03-01-2011    Colonoscopy  . Hemorrhoids 03-01-2011    Colonoscopy    Past Surgical History  Procedure Date  . Cardiac valve surgery 2007   family history includes Clotting disorder in her mother; Diabetes in her paternal grandmother; Heart disease in her mother; Liver cancer in her maternal grandfather; Other in her father; and Pancreatic cancer in her maternal grandfather.  There is no history of Colon cancer. Current Outpatient Prescriptions  Medication Sig Dispense Refill  . aspirin 81 MG tablet Take 81 mg by mouth daily.      Marland Kitchen atenolol (TENORMIN) 50 MG tablet Take 1 tablet (50 mg total) by mouth daily.  30 tablet  2  . BLACK COHOSH EXTRACT PO Take 1 tablet by mouth daily.      . carbamazepine (TEGRETOL) 200 MG tablet Take 200 mg by mouth 2 (two) times daily.      . clonazePAM (KLONOPIN) 0.5 MG tablet Take 0.5 mg by mouth 2 (two) times daily as needed.      Marland Kitchen FIBER SELECT GUMMIES PO Take 2 tablets by mouth daily.      Marland Kitchen gabapentin  (NEURONTIN) 300 MG capsule Take 300 mg by mouth daily.      . Loperamide-Simethicone (IMODIUM ADVANCED PO) Take by mouth. Takes 8 tablets by mouth once daily       . Melatonin 3 MG TABS Take 1 tablet by mouth daily.      Marland Kitchen omeprazole (PRILOSEC OTC) 20 MG tablet Take 20 mg by mouth 2 (two) times daily.        . propranolol ER (INDERAL LA) 80 MG 24 hr capsule Take 80 mg by mouth daily.      . traZODone (DESYREL) 100 MG tablet Take 200 mg by mouth at bedtime.        Allergies as of 03/08/2012  . (No Known Allergies)    reports that she has been smoking Cigarettes.  She has never used smokeless tobacco. She reports that she does not drink alcohol or use illicit drugs.     Review of Systems: Pertinent positive and negative review of systems were noted in the above HPI section. All other review of systems were otherwise negative.  Vital signs were reviewed in today's medical record Physical Exam: General: Well developed , well nourished, no acute distress Head: Normocephalic and atraumatic Eyes:  sclerae anicteric, EOMI Ears: Normal auditory acuity Mouth: No  deformity or lesions Neck: Supple, no masses or thyromegaly Lungs: Clear throughout to auscultation Heart: Regular rate and rhythm; no murmurs, rubs or bruits Abdomen: Soft, non tender and non distended. No masses, hepatosplenomegaly or hernias noted. Normal Bowel sounds Rectal:deferred Musculoskeletal: Symmetrical with no gross deformities  Skin: No lesions on visible extremities Pulses:  Normal pulses noted Extremities: No clubbing, cyanosis, edema or deformities noted Neurological: Alert oriented x 4, grossly nonfocal Cervical Nodes:  No significant cervical adenopathy Inguinal Nodes: No significant inguinal adenopathy Psychological:  Alert and cooperative. Normal mood and affect

## 2012-03-08 NOTE — Telephone Encounter (Signed)
Pt having abdominal pain and cramping. Complaining of diarrhea and vomiting. Pt scheduled to see Dr. Arlyce Dice today at 2:45pm. Pts husband aware of appt date and time.

## 2012-03-08 NOTE — Patient Instructions (Addendum)
You have been given a separate informational sheet regarding your tobacco use, the importance of quitting and local resources to help you quit. You have been scheduled for a flexible sigmoidoscopy. Please follow the written instructions given to you at your visit today. If you use inhalers (even only as needed), please bring them with you on the day of your procedure.  

## 2012-03-08 NOTE — Assessment & Plan Note (Signed)
I remain suspicious that patient's symptoms are 2 to underlying left-sided colitis. Severe IBS is less likely.  Medications #1 sigmoidoscopy #2 depending upon the findings, I would check a CRP, CBC and serologies for inflammatory bowel disease

## 2012-03-09 ENCOUNTER — Ambulatory Visit (AMBULATORY_SURGERY_CENTER): Payer: Self-pay | Admitting: Gastroenterology

## 2012-03-09 ENCOUNTER — Encounter: Payer: Self-pay | Admitting: Gastroenterology

## 2012-03-09 VITALS — BP 109/52 | HR 67 | Temp 97.3°F | Resp 17 | Ht 65.0 in | Wt 181.0 lb

## 2012-03-09 DIAGNOSIS — K5900 Constipation, unspecified: Secondary | ICD-10-CM

## 2012-03-09 DIAGNOSIS — R194 Change in bowel habit: Secondary | ICD-10-CM

## 2012-03-09 DIAGNOSIS — K219 Gastro-esophageal reflux disease without esophagitis: Secondary | ICD-10-CM

## 2012-03-09 DIAGNOSIS — R109 Unspecified abdominal pain: Secondary | ICD-10-CM

## 2012-03-09 DIAGNOSIS — R198 Other specified symptoms and signs involving the digestive system and abdomen: Secondary | ICD-10-CM

## 2012-03-09 MED ORDER — HYOSCYAMINE SULFATE ER 0.375 MG PO TBCR: EXTENDED_RELEASE_TABLET | ORAL | Status: DC

## 2012-03-09 MED ORDER — SODIUM CHLORIDE 0.9 % IV SOLN: 500.0000 mL | INTRAVENOUS | Status: DC

## 2012-03-09 NOTE — Op Note (Signed)
[facility] [address] [place], [address]   FLEXIBLE SIGMOIDOSCOPY PROCEDURE REPORT  PATIENT: Adriana, Hall  MR#: [id number] BIRTHDATE: 1970-12-13 , 41  yrs. old GENDER: Female ENDOSCOPIST: Louis Meckel, MD REFERRED BY: Lynnea Ferrier, M.D. PROCEDURE DATE:  03/09/2012 PROCEDURE:   Sigmoidoscopy, diagnostic ASA CLASS:   Class II INDICATIONS: MEDICATIONS: MAC sedation, administered by CRNA and Propofol (Diprivan) 140 mg IV  DESCRIPTION OF PROCEDURE:   After the risks benefits and alternatives of the procedure were thoroughly explained, informed consent was obtained.  revealed no abnormalities of the rectum. The [id number]-CF-[id number]  endoscope was introduced through the anus and advanced to the descending colon , limited by No adverse events experienced.   The quality of the prep was    .  The instrument was then slowly withdrawn as the mucosa was fully examined.       COLON FINDINGS: The colonic mucosa appeared normal. Retroflexed views revealed no abnormalities.    The scope was then withdrawn from the patient and the procedure terminated.  COMPLICATIONS: There were no complications.  ENDOSCOPIC IMPRESSION: The colonic mucosa appeared normal  RECOMMENDATIONS: Trial of Linzess 240ug daily   REPEAT EXAM:   _______________________________ eSignedLouis Meckel, MD 03/09/2012 10:29 AM   CC:

## 2012-03-09 NOTE — Patient Instructions (Addendum)
The colonic mucosa appeared normal.    Trial of Linzess daily,  Samples given.  YOU HAD AN ENDOSCOPIC PROCEDURE TODAY AT THE [facility]: Refer to the procedure report that was given to you for any specific questions about what was found during the examination.  If the procedure report does not answer your questions, please call your gastroenterologist to clarify.  If you requested that your care partner not be given the details of your procedure findings, then the procedure report has been included in a sealed envelope for you to review at your convenience later.  YOU SHOULD EXPECT: Some feelings of bloating in the abdomen. Passage of more gas than usual.  Walking can help get rid of the air that was put into your GI tract during the procedure and reduce the bloating. If you had a lower endoscopy (such as a colonoscopy or flexible sigmoidoscopy) you may notice spotting of blood in your stool or on the toilet paper. If you underwent a bowel prep for your procedure, then you may not have a normal bowel movement for a few days.  DIET: Your first meal following the procedure should be a light meal and then it is ok to progress to your normal diet.  A half-sandwich or bowl of soup is an example of a good first meal.  Heavy or fried foods are harder to digest and may make you feel nauseous or bloated.  Likewise meals heavy in dairy and vegetables can cause extra gas to form and this can also increase the bloating.  Drink plenty of fluids but you should avoid alcoholic beverages for 24 hours.  ACTIVITY: Your care partner should take you home directly after the procedure.  You should plan to take it easy, moving slowly for the rest of the day.  You can resume normal activity the day after the procedure however you should NOT DRIVE or use heavy machinery for 24 hours (because of the sedation medicines used during the test).    SYMPTOMS TO REPORT IMMEDIATELY: A gastroenterologist can be reached at  any hour.  During normal business hours, 8:30 AM to 5:00 PM Monday through Friday, call [phone].  After hours and on weekends, please call the GI answering service at [phone] who will take a message and have the physician on call contact you.   Following lower endoscopy (colonoscopy or flexible sigmoidoscopy):  Excessive amounts of blood in the stool  Significant tenderness or worsening of abdominal pains  Swelling of the abdomen that is new, acute  Fever of 100F or higher  FOLLOW UP: If any biopsies were taken you will be contacted by phone or by letter within the next 1-3 weeks.  Call your gastroenterologist if you have not heard about the biopsies in 3 weeks.  Our staff will call the home number listed on your records the next business day following your procedure to check on you and address any questions or concerns that you may have at that time regarding the information given to you following your procedure. This is a courtesy call and so if there is no answer at the home number and we have not heard from you through the emergency physician on call, we will assume that you have returned to your regular daily activities without incident.  SIGNATURES/CONFIDENTIALITY: You and/or your care partner have signed paperwork which will be entered into your electronic medical record.  These signatures attest to the fact that that the information above on your After  Visit Summary has been reviewed and is understood.  Full responsibility of the confidentiality of this discharge information lies with you and/or your care-partner.

## 2012-03-12 ENCOUNTER — Telehealth: Payer: Self-pay | Admitting: *Deleted

## 2012-03-12 ENCOUNTER — Encounter: Payer: Self-pay | Admitting: Gastroenterology

## 2012-03-12 ENCOUNTER — Telehealth: Payer: Self-pay | Admitting: Gastroenterology

## 2012-03-12 NOTE — Telephone Encounter (Signed)
  Spoke with patient at 455pm today.  She said that she was much better.  She said that she was eating crackers and sipping tea.  All stayed down, but she was still a little nauseated.  I told her to keep eating bland foods, and to call us later on if the nausea gets worse.   She said that she was okay with that, and I told her to please call us in the morning for an update.  She agreed.  The patient did sound much better.  Again, she will call us if the nausea gets worse.

## 2012-03-12 NOTE — Telephone Encounter (Signed)
No answer left message to call if questions or concerns. 

## 2012-03-12 NOTE — Telephone Encounter (Signed)
Spoke with patient and she stated that she has had diarrhea since the evening after her flex sigmoid on Friday.   She stated that she has only eaten a bowl of potato soup since Friday and that her stomach hurts.   She has not started the new med that Dr. Arlyce Dice put her on Friday.   She said that she is "afraid to eat due to the diarrhea and nausea."   I told her that she probably felt that way due to the fact that she hasn't eaten in days.  She has no fever, but does have some cramping.  I told her to get some crackers and some hot tea, and sit down in her comfortable chair and eat and drink.   Her stomach needs to have some food in it in order for her to take her meds, and she really needs to take her meds.   Patient agreed to do that.   She has been taking  Antidiarrheals and antinausea meds to no avail.     I told the patient to please call me back around 4pm today and give me an update.   She agreed, and stated that she would get the crackers and tea.  She also said that she , "Might try some oatmeal."  Patient did come in with diarrhea for the flex sigmoid, but has not been able to take her meds.

## 2012-03-12 NOTE — Telephone Encounter (Signed)
Error

## 2012-04-27 NOTE — Telephone Encounter (Signed)
See previous note

## 2012-05-17 ENCOUNTER — Telehealth: Payer: Self-pay | Admitting: Gastroenterology

## 2012-05-17 NOTE — Telephone Encounter (Signed)
OK TO COME GET LINZESS SAMPLES

## 2012-06-04 ENCOUNTER — Telehealth: Payer: Self-pay | Admitting: Gastroenterology

## 2012-06-04 MED ORDER — PROMETHAZINE HCL 25 MG PO TABS: 25.0000 mg | ORAL_TABLET | Freq: Four times a day (QID) | ORAL | Status: DC | PRN

## 2012-06-04 NOTE — Telephone Encounter (Signed)
Patient reports that she is having abdominal pain and she is not been eating.  She reports 13 lb weight loss over about 1 month, due to not eating from pain.  She is scheduled to come in and see Willette Cluster RNP on 06/07/12 1:30,.  She is asked to try the dicyclomine she has at home due to the hyoscyamine not helping, she will try this 30 minutes prior  To eating.  She is requesting phenergan for nausea.  Amy Esterwood PA is it ok to send her in a rx?

## 2012-06-04 NOTE — Telephone Encounter (Signed)
Discussed with Mike Gip PA ok for phenergan , I have left a message for the patient that rx was sent and keep appt on 06/07/12

## 2012-06-07 ENCOUNTER — Ambulatory Visit (INDEPENDENT_AMBULATORY_CARE_PROVIDER_SITE_OTHER): Payer: Self-pay | Admitting: Nurse Practitioner

## 2012-06-07 ENCOUNTER — Telehealth: Payer: Self-pay | Admitting: Gastroenterology

## 2012-06-07 ENCOUNTER — Encounter: Payer: Self-pay | Admitting: Nurse Practitioner

## 2012-06-07 ENCOUNTER — Other Ambulatory Visit (INDEPENDENT_AMBULATORY_CARE_PROVIDER_SITE_OTHER): Payer: Self-pay

## 2012-06-07 VITALS — BP 110/68 | HR 80 | Temp 99.0°F | Ht 65.0 in | Wt 174.6 lb

## 2012-06-07 DIAGNOSIS — R1010 Upper abdominal pain, unspecified: Secondary | ICD-10-CM

## 2012-06-07 DIAGNOSIS — R1110 Vomiting, unspecified: Secondary | ICD-10-CM

## 2012-06-07 DIAGNOSIS — R197 Diarrhea, unspecified: Secondary | ICD-10-CM

## 2012-06-07 DIAGNOSIS — R109 Unspecified abdominal pain: Secondary | ICD-10-CM

## 2012-06-07 DIAGNOSIS — R634 Abnormal weight loss: Secondary | ICD-10-CM

## 2012-06-07 LAB — COMPREHENSIVE METABOLIC PANEL
ALT: 12 U/L (ref 0–35)
AST: 12 U/L (ref 0–37)
Albumin: 3.5 g/dL (ref 3.5–5.2)
CO2: 28 mEq/L (ref 19–32)
Calcium: 8.7 mg/dL (ref 8.4–10.5)
Chloride: 103 mEq/L (ref 96–112)
Creatinine, Ser: 0.7 mg/dL (ref 0.4–1.2)
GFR: 93.67 mL/min (ref >60.00)
Potassium: 4.2 mEq/L (ref 3.5–5.1)
Sodium: 138 mEq/L (ref 135–145)
Total Protein: 6.7 g/dL (ref 6.0–8.3)

## 2012-06-07 LAB — CBC WITH DIFFERENTIAL/PLATELET
Basophils Absolute: 0.1 10*3/uL (ref 0.0–0.1)
Eosinophils Absolute: 0.3 10*3/uL (ref 0.0–0.7)
Hemoglobin: 16.6 g/dL — ABNORMAL HIGH (ref 12.0–15.0)
Lymphocytes Relative: 30.4 % (ref 12.0–46.0)
MCHC: 34.7 g/dL (ref 30.0–36.0)
Monocytes Relative: 10.6 % (ref 3.0–12.0)
Neutro Abs: 6.1 10*3/uL (ref 1.4–7.7)
Neutrophils Relative %: 54.9 % (ref 43.0–77.0)
RDW: 13.4 % (ref 11.5–14.6)

## 2012-06-07 MED ORDER — HYDROCODONE-ACETAMINOPHEN 5-325 MG PO TABS: 1.0000 | ORAL_TABLET | Freq: Four times a day (QID) | ORAL | Status: DC | PRN

## 2012-06-07 MED ORDER — DIPHENOXYLATE-ATROPINE 2.5-0.025 MG PO TABS: 1.0000 | ORAL_TABLET | Freq: Four times a day (QID) | ORAL | Status: DC | PRN

## 2012-06-07 NOTE — Telephone Encounter (Signed)
Called the pharmacy and spoke to [facility] and she said the fax was received for both prescriptions. They are filling them.

## 2012-06-07 NOTE — Progress Notes (Signed)
06/07/2012 Adriana Hall [id number] 1970-09-20   History of Present Illness:  Patient is a 42 year old female followed by Dr. Arlyce Dice for altered bowel habits, abdominal pain. I saw patient September 2012 when she presented with a two month history of diarrhea, abdominal pain and bloating. Stool studies were negative. She underwent colonoscopy which revealed left sided colitis. There was mild erythema from the rectum to the proximal descending colon. Despite endoscopic findings, biopsies were normal. Random colon biopsies were negative as well. At her followup visit in early October 2012 patient was still having abdominal pain, diarrhea and also reported nausea. She was given a trial of Lialda and Bentyl. CT scan of the abdomen and pelvis was ordered for further evaluation, it was completely normal. Patient returned late October 2012 with ongoing abdominal pain and diarrhea. Lialda was not helpful. Patient was treated empirically with Flagyl, Lialda was discontinued.  Patient represented this October with abdominal pain, diarrhea and constipation. For further evaluation, she underwent flexible sigmoidoscopy  which was normal. Patient was given a trial of Linzess which she is no longer taking because of diarrhea and nausea. We called in phenergan and dicyclomine a few days ago. Dicyclomine doesn't help, Hyoscyamine didn't help either. She reports a 13 pound weight loss.  She is still having diarrhea, up to 8 times a day depending on intake. No nocturnal diarrhea. Immodium doesn't help. She complains of diffuse abdominal pain but seems to be focused on upper abdomen. She takes one Prilosec OTC twice daily before meals. No significant NSAID use.  Malia's bowel habits swing between constipation / diarrhea but over the last several weeks she has had only diarrhea. Diarrhea associated with diffuse abdominal cramps unrelieved with defecation. She has lost 14 pounds. She has bleeding hemorrhoids. She is having  frequent nausea and vomiting over the last two weeks. Emesis non-bloody. No undigested food. She had a tubal ligation 15 years ago. No new medications.   Current Medications, Allergies, Past Medical History, Past Surgical History, Family History and Social History were reviewed in [employer] record.   Physical Exam: General: Well developed , white female in no acute distress Head: Normocephalic and atraumatic Eyes:  sclerae anicteric, conjunctiva pink  Ears: Normal auditory acuity Lungs: Clear throughout to auscultation Heart: Regular rate and rhythm Abdomen: Soft, mild diffuse tenderness, greatest in mid upper abdomen and RUQ. Normal bowel sounds Musculoskeletal: Symmetrical with no gross deformities  Extremities: No edema  Neurological: Alert oriented x 4, grossly nonfocal Psychological:  Alert and cooperative. Normal mood and affect  Assessment and Recommendations:  28. 42 year old female with chronically altered bowel habits, predominantly diarrhea and chronic lower abdominal cramps. 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 Imodium ineffective and bowel antispasmodics don't relieve the pain. Trial of Lomotil 3-4 times daily. Cautioned constipation.   2. Nausea, vomiting, diffuse abdominal pain. Patient describes diffuse abdominal pain but seems to be mostly focused on upper abdominal discomfort.. She has had problems with nausea before, vomiting is apparently new. Emesis non-bloody.  Difficult to sort out what is new versus exacerbation of chronic symptoms. Patient is emotional, appears physically uncomfortable . She is tender in the right upper quadrant. Will obtain labs today as well as an ultrasound of the abdomen to evaluate for gallbladder disease. Patient will return to see Dr. Arlyce Dice in a couple of weeks. If still having upper abdominal pain, nausea and vomiting, she may need upper endoscopy. In  the meantime, until  things can be further sorted, will give her Hydrocodone to use for the pain.

## 2012-06-07 NOTE — Patient Instructions (Addendum)
Please go to the basement level to have your labs drawn.   We have scheduled an ultrasound of abdomen for nausea, vomiting, upper abdominal pain,. Date : 06-08-2012 Friday. Arrive at 8:45 Am.  You need to go to [facility] Radiology, 1st floor. Go to patient registration. Have nothing to eat or drink past midnight.  Hydrocodone 5/325, Take 1 tablet every 4 hours prn pain.   Lomotil 1 Q6 hours prn for diarrhea. Hold for constipation. We faxed a prescription to your pharmacy for both medications.

## 2012-06-08 ENCOUNTER — Ambulatory Visit (HOSPITAL_COMMUNITY)
Admission: RE | Admit: 2012-06-08 | Discharge: 2012-06-08 | Disposition: A | Discharge status: Home / Self Care | Payer: Self-pay | Source: Ambulatory Visit | Attending: Physician Assistant | Admitting: Physician Assistant

## 2012-06-08 ENCOUNTER — Encounter: Payer: Self-pay | Admitting: Nurse Practitioner

## 2012-06-08 DIAGNOSIS — R1110 Vomiting, unspecified: Secondary | ICD-10-CM | POA: Insufficient documentation

## 2012-06-08 DIAGNOSIS — R109 Unspecified abdominal pain: Secondary | ICD-10-CM | POA: Insufficient documentation

## 2012-06-08 DIAGNOSIS — R197 Diarrhea, unspecified: Secondary | ICD-10-CM | POA: Insufficient documentation

## 2012-06-08 DIAGNOSIS — R634 Abnormal weight loss: Secondary | ICD-10-CM | POA: Insufficient documentation

## 2012-06-08 DIAGNOSIS — R1010 Upper abdominal pain, unspecified: Secondary | ICD-10-CM

## 2012-06-11 NOTE — Progress Notes (Signed)
I would include in your note that Linzess was given for possibility of overflow diarrhea in setting of chronic constipation.    Reviewed and agree with management. Barbette Hair. Arlyce Dice, M.D., [facility]

## 2012-07-03 ENCOUNTER — Ambulatory Visit: Payer: Self-pay | Admitting: Gastroenterology

## 2012-07-23 ENCOUNTER — Encounter (HOSPITAL_COMMUNITY): Payer: Self-pay | Admitting: Emergency Medicine

## 2012-07-23 ENCOUNTER — Emergency Department (HOSPITAL_COMMUNITY)
Admission: EM | Admit: 2012-07-23 | Discharge: 2012-07-23 | Disposition: A | Discharge status: Home / Self Care | Payer: Self-pay | Attending: Emergency Medicine | Admitting: Emergency Medicine

## 2012-07-23 DIAGNOSIS — R110 Nausea: Secondary | ICD-10-CM | POA: Insufficient documentation

## 2012-07-23 DIAGNOSIS — R300 Dysuria: Secondary | ICD-10-CM | POA: Insufficient documentation

## 2012-07-23 DIAGNOSIS — Z8679 Personal history of other diseases of the circulatory system: Secondary | ICD-10-CM | POA: Insufficient documentation

## 2012-07-23 DIAGNOSIS — F329 Major depressive disorder, single episode, unspecified: Secondary | ICD-10-CM | POA: Insufficient documentation

## 2012-07-23 DIAGNOSIS — Z3202 Encounter for pregnancy test, result negative: Secondary | ICD-10-CM | POA: Insufficient documentation

## 2012-07-23 DIAGNOSIS — F3289 Other specified depressive episodes: Secondary | ICD-10-CM | POA: Insufficient documentation

## 2012-07-23 DIAGNOSIS — I10 Essential (primary) hypertension: Secondary | ICD-10-CM | POA: Insufficient documentation

## 2012-07-23 DIAGNOSIS — Z79899 Other long term (current) drug therapy: Secondary | ICD-10-CM | POA: Insufficient documentation

## 2012-07-23 DIAGNOSIS — R319 Hematuria, unspecified: Secondary | ICD-10-CM | POA: Insufficient documentation

## 2012-07-23 DIAGNOSIS — F17200 Nicotine dependence, unspecified, uncomplicated: Secondary | ICD-10-CM | POA: Insufficient documentation

## 2012-07-23 DIAGNOSIS — F411 Generalized anxiety disorder: Secondary | ICD-10-CM | POA: Insufficient documentation

## 2012-07-23 DIAGNOSIS — Z8719 Personal history of other diseases of the digestive system: Secondary | ICD-10-CM | POA: Insufficient documentation

## 2012-07-23 DIAGNOSIS — Z792 Long term (current) use of antibiotics: Secondary | ICD-10-CM | POA: Insufficient documentation

## 2012-07-23 DIAGNOSIS — N390 Urinary tract infection, site not specified: Secondary | ICD-10-CM | POA: Insufficient documentation

## 2012-07-23 LAB — URINE MICROSCOPIC-ADD ON

## 2012-07-23 LAB — URINALYSIS, ROUTINE W REFLEX MICROSCOPIC
Bilirubin Urine: NEGATIVE
Ketones, ur: NEGATIVE mg/dL
Nitrite: POSITIVE — AB
Protein, ur: NEGATIVE mg/dL
Specific Gravity, Urine: 1.015 (ref 1.005–1.030)
Urobilinogen, UA: 1 mg/dL (ref 0.0–1.0)

## 2012-07-23 LAB — POCT PREGNANCY, URINE: Preg Test, Ur: NEGATIVE

## 2012-07-23 MED ORDER — CEPHALEXIN 250 MG PO CAPS
500.0000 mg | ORAL_CAPSULE | Freq: Once | ORAL | Status: AC
  Administered 2012-07-23: 500 mg via ORAL
  Filled 2012-07-23: qty 2

## 2012-07-23 MED ORDER — HYDROCODONE-ACETAMINOPHEN 5-325 MG PO TABS: 2.0000 | ORAL_TABLET | Freq: Four times a day (QID) | ORAL | Status: DC | PRN

## 2012-07-23 MED ORDER — CEPHALEXIN 500 MG PO CAPS: 500.0000 mg | ORAL_CAPSULE | Freq: Four times a day (QID) | ORAL | Status: DC

## 2012-07-23 MED ORDER — HYDROCODONE-ACETAMINOPHEN 5-325 MG PO TABS
2.0000 | ORAL_TABLET | Freq: Once | ORAL | Status: AC
  Administered 2012-07-23: 2 via ORAL
  Filled 2012-07-23: qty 2

## 2012-07-23 NOTE — ED Notes (Signed)
Patient is alert and orientedx4.  Patient was explained discharge instructions and they understood them with no questions.  Patient's husband, Suzzette Righter is taking the patient home.

## 2012-07-23 NOTE — ED Notes (Signed)
Pt st's she was seen at a clinic on Sat and told she had a "bad" UTI.  Was given antibiotic and peridium for pain.  St's she is not any better.  C/o pain and burning with urination
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
## 2012-07-23 NOTE — ED Notes (Signed)
Patient has been having painful urination, and hematuria since Tuesday.  [facility] where she was prescribed antibiotics.  The symptoms have gotten worse so she responded to the [facility].

## 2012-07-23 NOTE — ED Notes (Signed)
Family at bedside. 

## 2012-07-24 LAB — URINE CULTURE: Culture: NO GROWTH

## 2012-07-30 ENCOUNTER — Emergency Department (HOSPITAL_COMMUNITY)
Admission: EM | Admit: 2012-07-30 | Discharge: 2012-07-31 | Disposition: A | Discharge status: Home / Self Care | Payer: Self-pay | Attending: Emergency Medicine | Admitting: Emergency Medicine

## 2012-07-30 ENCOUNTER — Encounter (HOSPITAL_COMMUNITY): Payer: Self-pay | Admitting: *Deleted

## 2012-07-30 ENCOUNTER — Emergency Department (HOSPITAL_COMMUNITY): Payer: Self-pay

## 2012-07-30 DIAGNOSIS — F329 Major depressive disorder, single episode, unspecified: Secondary | ICD-10-CM | POA: Insufficient documentation

## 2012-07-30 DIAGNOSIS — Z8679 Personal history of other diseases of the circulatory system: Secondary | ICD-10-CM | POA: Insufficient documentation

## 2012-07-30 DIAGNOSIS — J3489 Other specified disorders of nose and nasal sinuses: Secondary | ICD-10-CM | POA: Insufficient documentation

## 2012-07-30 DIAGNOSIS — IMO0002 Reserved for concepts with insufficient information to code with codable children: Secondary | ICD-10-CM | POA: Insufficient documentation

## 2012-07-30 DIAGNOSIS — Z79899 Other long term (current) drug therapy: Secondary | ICD-10-CM | POA: Insufficient documentation

## 2012-07-30 DIAGNOSIS — R05 Cough: Secondary | ICD-10-CM | POA: Insufficient documentation

## 2012-07-30 DIAGNOSIS — Z8719 Personal history of other diseases of the digestive system: Secondary | ICD-10-CM | POA: Insufficient documentation

## 2012-07-30 DIAGNOSIS — R059 Cough, unspecified: Secondary | ICD-10-CM | POA: Insufficient documentation

## 2012-07-30 DIAGNOSIS — I10 Essential (primary) hypertension: Secondary | ICD-10-CM | POA: Insufficient documentation

## 2012-07-30 DIAGNOSIS — M549 Dorsalgia, unspecified: Secondary | ICD-10-CM | POA: Insufficient documentation

## 2012-07-30 DIAGNOSIS — J9801 Acute bronchospasm: Secondary | ICD-10-CM | POA: Insufficient documentation

## 2012-07-30 DIAGNOSIS — F411 Generalized anxiety disorder: Secondary | ICD-10-CM | POA: Insufficient documentation

## 2012-07-30 DIAGNOSIS — R079 Chest pain, unspecified: Secondary | ICD-10-CM | POA: Insufficient documentation

## 2012-07-30 DIAGNOSIS — F17200 Nicotine dependence, unspecified, uncomplicated: Secondary | ICD-10-CM | POA: Insufficient documentation

## 2012-07-30 DIAGNOSIS — F3289 Other specified depressive episodes: Secondary | ICD-10-CM | POA: Insufficient documentation

## 2012-07-30 LAB — BASIC METABOLIC PANEL
BUN: 8 mg/dL (ref 6–23)
CO2: 26 mEq/L (ref 19–32)
Calcium: 9.6 mg/dL (ref 8.4–10.5)
Chloride: 96 mEq/L (ref 96–112)
Creatinine, Ser: 0.82 mg/dL (ref 0.50–1.10)
GFR calc Af Amer: >90 mL/min (ref >90)
GFR calc non Af Amer: 86 mL/min — ABNORMAL LOW (ref >90)
Glucose, Bld: 86 mg/dL (ref 70–99)
Potassium: 3.4 mEq/L — ABNORMAL LOW (ref 3.5–5.1)
Sodium: 134 mEq/L — ABNORMAL LOW (ref 135–145)

## 2012-07-30 LAB — CBC
HCT: 46 % (ref 36.0–46.0)
Hemoglobin: 15.8 g/dL — ABNORMAL HIGH (ref 12.0–15.0)
MCH: 31.7 pg (ref 26.0–34.0)
MCHC: 34.3 g/dL (ref 30.0–36.0)
MCV: 92.2 fL (ref 78.0–100.0)
Platelets: 237 10*3/uL (ref 150–400)
RBC: 4.99 MIL/uL (ref 3.87–5.11)
RDW: 13.5 % (ref 11.5–15.5)
WBC: 13.4 10*3/uL — ABNORMAL HIGH (ref 4.0–10.5)

## 2012-07-30 LAB — PRO B NATRIURETIC PEPTIDE: Pro B Natriuretic peptide (BNP): 59.9 pg/mL (ref 0–125)

## 2012-07-30 LAB — TROPONIN I: Troponin I: <0.3 ng/mL (ref <0.30)

## 2012-07-30 MED ORDER — ALBUTEROL SULFATE HFA 108 (90 BASE) MCG/ACT IN AERS
2.0000 | INHALATION_SPRAY | Freq: Once | RESPIRATORY_TRACT | Status: AC
  Administered 2012-07-31: 2 via RESPIRATORY_TRACT
  Filled 2012-07-30: qty 6.7

## 2012-07-30 MED ORDER — AZITHROMYCIN 250 MG PO TABS: 250.0000 mg | ORAL_TABLET | Freq: Every day | ORAL | Status: DC

## 2012-07-30 MED ORDER — LORAZEPAM 1 MG PO TABS
1.0000 mg | ORAL_TABLET | Freq: Once | ORAL | Status: AC
  Administered 2012-07-30: 1 mg via ORAL
  Filled 2012-07-30: qty 1

## 2012-07-30 MED ORDER — IPRATROPIUM BROMIDE 0.02 % IN SOLN
0.5000 mg | Freq: Once | RESPIRATORY_TRACT | Status: AC
  Administered 2012-07-30: 0.5 mg via RESPIRATORY_TRACT
  Filled 2012-07-30: qty 2.5

## 2012-07-30 MED ORDER — PREDNISONE 20 MG PO TABS
60.0000 mg | ORAL_TABLET | Freq: Once | ORAL | Status: AC
  Administered 2012-07-30: 60 mg via ORAL
  Filled 2012-07-30: qty 3

## 2012-07-30 MED ORDER — ALBUTEROL SULFATE (5 MG/ML) 0.5% IN NEBU
5.0000 mg | INHALATION_SOLUTION | Freq: Once | RESPIRATORY_TRACT | Status: AC
  Administered 2012-07-30: 5 mg via RESPIRATORY_TRACT
  Filled 2012-07-30: qty 1

## 2012-07-30 MED ORDER — PREDNISONE 20 MG PO TABS: 40.0000 mg | ORAL_TABLET | Freq: Every day | ORAL | Status: DC

## 2012-07-30 NOTE — ED Notes (Signed)
Pt states for the past 4 days she's had chest pain, cough, congestion, but today chest pain has worsened and she has been short of breath. Pt crying, saying she can't breathe. Pt receiving breathing tx, 100% on RA. Pt states was also on Keflex for UTI recently.

## 2012-07-30 NOTE — ED Notes (Addendum)
Pt reports she was recently dx and treated for UTI on 07/23/2012 - pt states since 07/30/2012 she has been experiencing intermittent chest and back pain and shortness of breath - pain described as a squeezing all the way around her chest. Pt also admits to some dizziness, non-productive cough and pain w/ cough and deep inspiration - pt speaking complete sentences.
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
## 2012-08-29 ENCOUNTER — Ambulatory Visit (INDEPENDENT_AMBULATORY_CARE_PROVIDER_SITE_OTHER): Payer: Self-pay | Admitting: Family Medicine

## 2012-08-29 ENCOUNTER — Encounter: Payer: Self-pay | Admitting: Family Medicine

## 2012-08-29 VITALS — BP 110/80 | HR 72 | Temp 98.6°F | Resp 16 | Wt 170.0 lb

## 2012-08-29 DIAGNOSIS — F319 Bipolar disorder, unspecified: Secondary | ICD-10-CM | POA: Insufficient documentation

## 2012-08-29 DIAGNOSIS — F3130 Bipolar disorder, current episode depressed, mild or moderate severity, unspecified: Secondary | ICD-10-CM

## 2012-08-29 MED ORDER — FLUCONAZOLE 150 MG PO TABS: 150.0000 mg | ORAL_TABLET | Freq: Once | ORAL | Status: DC

## 2012-08-29 NOTE — Patient Instructions (Signed)
Take 1 20 mg tab per day for the first 2 days, then increase to 40 mg (either 2 20 mg tabs or 1 40 mg tab).

## 2012-08-29 NOTE — Progress Notes (Signed)
Subjective:     Patient ID: Adriana Hall, female   DOB: 1970/07/20, 42 y.o.   MRN: [id number]  HPI Have not seen the patient since August 2013.  At that time she is on trazodone 200 mg by mouth each bedtime, Tegretol 200 mg by mouth twice a day, Klonopin 0.5 mg by mouth twice a day.  She stated that was working sufficiently until last 2 months. Since that time, she's had to drop out of school due to poor concentration, increasing anxiety, and inability  to function.  She feels generally depressed.  States that she feels that she is on the verge of a nervous breakdown.  He reports daily panic attacks.  She is avoiding crowds.  She denies suicidal thoughts.  However she's extremely tearful and anxious on exam.  She is also focused on her inability to concentrate and her hyperactivity. Past Medical History  Diagnosis Date  . Anxiety     10 years  . Arrhythmia     5 years  . Headache     Chronic for 30 yrs  . Depression     10 years  . Hypertension   . Colitis 03-01-2011    Colonoscopy  . Hemorrhoids 03-01-2011    Colonoscopy   . Bipolar 1 disorder, depressed    Current Outpatient Prescriptions on File Prior to Visit  Medication Sig Dispense Refill  . atenolol (TENORMIN) 50 MG tablet Take 50 mg by mouth every evening.      . carbamazepine (TEGRETOL) 200 MG tablet Take 200 mg by mouth 2 (two) times daily.      . clonazePAM (KLONOPIN) 0.5 MG tablet Take 0.5 mg by mouth 2 (two) times daily as needed for anxiety.       . hyoscyamine (LEVBID) 0.375 MG 12 hr tablet Take 0.375 mg by mouth every 12 (twelve) hours as needed for cramping.      Marland Kitchen omeprazole (PRILOSEC OTC) 20 MG tablet Take 20 mg by mouth 2 (two) times daily.        . traZODone (DESYREL) 100 MG tablet Take 200 mg by mouth at bedtime.        No current facility-administered medications on file prior to visit.     Review of Systems    review of systems is otherwise negative Objective:   Physical Exam  Constitutional: She  appears well-developed and well-nourished.  HENT:  Head: Normocephalic and atraumatic.  Cardiovascular: Normal rate, regular rhythm and normal heart sounds.   Pulmonary/Chest: Effort normal and breath sounds normal.  Psychiatric: Her mood appears anxious. Her affect is not blunt, not labile and not inappropriate. Her speech is not rapid and/or pressured, not delayed, not tangential and not slurred. She is not agitated, not aggressive, not slowed, not withdrawn and not actively hallucinating. Thought content is not paranoid and not delusional. She does not express impulsivity or inappropriate judgment. She exhibits a depressed mood. She expresses no homicidal and no suicidal ideation. She expresses no suicidal plans. She is attentive.       Assessment:     Bipolar disorder with depressed mood    Plan:     We'll add fetzima 20 mg tab to her current medication regimen.  She is to take one tablet every morning for the first 2 days then increase to 2 tablets every day thereafter for a total of 40 mg a day.  She is to follow with me in 3-4 weeks to assess affect. She is to call me immediately  if symptoms worsen. She is instructed to go the emergency room or see me immediately if suicidal thoughts develop.  There are no other changes to her medication regimen.

## 2012-09-19 ENCOUNTER — Telehealth: Payer: Self-pay | Admitting: Family Medicine

## 2012-09-20 MED ORDER — FLUOXETINE HCL 20 MG PO TABS: 20.0000 mg | ORAL_TABLET | Freq: Every day | ORAL | Status: DC

## 2012-09-20 NOTE — Telephone Encounter (Signed)
Pt aware per vm.  

## 2012-09-20 NOTE — Telephone Encounter (Signed)
D/c fetzima and try prozac 20 mg poqday.  Follow up in 1 month

## 2012-10-01 ENCOUNTER — Ambulatory Visit: Payer: Self-pay | Admitting: Family Medicine

## 2012-10-04 ENCOUNTER — Ambulatory Visit: Payer: Self-pay | Admitting: Family Medicine

## 2012-10-11 ENCOUNTER — Telehealth: Payer: Self-pay | Admitting: Family Medicine

## 2012-10-11 NOTE — Telephone Encounter (Signed)
?   OK to Refill  

## 2012-10-11 NOTE — Telephone Encounter (Signed)
Pharmacy aware of denial.

## 2012-10-11 NOTE — Telephone Encounter (Signed)
Ntbs, dnka

## 2012-10-16 ENCOUNTER — Telehealth: Payer: Self-pay | Admitting: Family Medicine

## 2012-10-16 NOTE — Telephone Encounter (Signed)
Clonazepam 0.5mg  Take 0.5 mg by mouth 2 (two) times daily as needed for anxiety. Last refill 07/02/12

## 2012-10-16 NOTE — Telephone Encounter (Signed)
Denied and faxed to pharmacy

## 2012-10-19 ENCOUNTER — Telehealth: Payer: Self-pay | Admitting: *Deleted

## 2012-10-19 NOTE — Telephone Encounter (Signed)
..  Patient aware per vm 

## 2012-10-19 NOTE — Telephone Encounter (Signed)
Clonazepam 0.5mg  Take 0.5 mg by mouth 2 (two) times daily as needed for anxiety. Last refill 07/02/12. pt has appt next Friday can we go ahead refill

## 2012-10-26 ENCOUNTER — Ambulatory Visit: Payer: Self-pay | Admitting: Family Medicine

## 2012-11-02 ENCOUNTER — Ambulatory Visit: Payer: Self-pay | Admitting: Family Medicine

## 2012-11-26 ENCOUNTER — Other Ambulatory Visit: Payer: Self-pay | Admitting: Family Medicine

## 2013-01-23 ENCOUNTER — Encounter (HOSPITAL_COMMUNITY): Payer: Self-pay | Admitting: Emergency Medicine

## 2013-01-23 ENCOUNTER — Emergency Department (HOSPITAL_COMMUNITY)
Admission: EM | Admit: 2013-01-23 | Discharge: 2013-01-23 | Disposition: A | Discharge status: Home / Self Care | Payer: Self-pay | Attending: Emergency Medicine | Admitting: Emergency Medicine

## 2013-01-23 DIAGNOSIS — Z3202 Encounter for pregnancy test, result negative: Secondary | ICD-10-CM | POA: Insufficient documentation

## 2013-01-23 DIAGNOSIS — Z79899 Other long term (current) drug therapy: Secondary | ICD-10-CM | POA: Insufficient documentation

## 2013-01-23 DIAGNOSIS — F3130 Bipolar disorder, current episode depressed, mild or moderate severity, unspecified: Secondary | ICD-10-CM | POA: Insufficient documentation

## 2013-01-23 DIAGNOSIS — I10 Essential (primary) hypertension: Secondary | ICD-10-CM | POA: Insufficient documentation

## 2013-01-23 DIAGNOSIS — R112 Nausea with vomiting, unspecified: Secondary | ICD-10-CM | POA: Insufficient documentation

## 2013-01-23 DIAGNOSIS — R42 Dizziness and giddiness: Secondary | ICD-10-CM | POA: Insufficient documentation

## 2013-01-23 DIAGNOSIS — Z8679 Personal history of other diseases of the circulatory system: Secondary | ICD-10-CM | POA: Insufficient documentation

## 2013-01-23 DIAGNOSIS — F411 Generalized anxiety disorder: Secondary | ICD-10-CM | POA: Insufficient documentation

## 2013-01-23 DIAGNOSIS — Z8719 Personal history of other diseases of the digestive system: Secondary | ICD-10-CM | POA: Insufficient documentation

## 2013-01-23 DIAGNOSIS — F17200 Nicotine dependence, unspecified, uncomplicated: Secondary | ICD-10-CM | POA: Insufficient documentation

## 2013-01-23 DIAGNOSIS — R55 Syncope and collapse: Secondary | ICD-10-CM | POA: Insufficient documentation

## 2013-01-23 LAB — CBC
HCT: 48.2 % — ABNORMAL HIGH (ref 36.0–46.0)
Hemoglobin: 17 g/dL — ABNORMAL HIGH (ref 12.0–15.0)
MCHC: 35.3 g/dL (ref 30.0–36.0)
WBC: 12.2 10*3/uL — ABNORMAL HIGH (ref 4.0–10.5)

## 2013-01-23 LAB — POCT PREGNANCY, URINE: Preg Test, Ur: NEGATIVE

## 2013-01-23 LAB — HEPATIC FUNCTION PANEL
Albumin: 3.6 g/dL (ref 3.5–5.2)
Total Protein: 6.7 g/dL (ref 6.0–8.3)

## 2013-01-23 LAB — BASIC METABOLIC PANEL
Chloride: 103 mEq/L (ref 96–112)
GFR calc Af Amer: >90 mL/min (ref >90)
GFR calc non Af Amer: >90 mL/min (ref >90)
Potassium: 3.9 mEq/L (ref 3.5–5.1)
Sodium: 136 mEq/L (ref 135–145)

## 2013-01-23 MED ORDER — SODIUM CHLORIDE 0.9 % IV SOLN
1000.0000 mL | Freq: Once | INTRAVENOUS | Status: AC
  Administered 2013-01-23: 1000 mL via INTRAVENOUS

## 2013-01-23 MED ORDER — SODIUM CHLORIDE 0.9 % IV SOLN: 1000.0000 mL | INTRAVENOUS | Status: DC

## 2013-01-23 MED ORDER — METOCLOPRAMIDE HCL 5 MG/ML IJ SOLN
10.0000 mg | Freq: Once | INTRAMUSCULAR | Status: AC
  Administered 2013-01-23: 10 mg via INTRAVENOUS
  Filled 2013-01-23: qty 2

## 2013-01-23 MED ORDER — PROMETHAZINE HCL 25 MG PO TABS: 25.0000 mg | ORAL_TABLET | Freq: Four times a day (QID) | ORAL | Status: DC | PRN

## 2013-01-23 MED ORDER — SODIUM CHLORIDE 0.9 % IV SOLN: 1000.0000 mL | Freq: Once | INTRAVENOUS | Status: DC

## 2013-01-23 MED ORDER — ONDANSETRON HCL 4 MG/2ML IJ SOLN
4.0000 mg | Freq: Once | INTRAMUSCULAR | Status: AC
  Administered 2013-01-23: 4 mg via INTRAVENOUS
  Filled 2013-01-23: qty 2
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
## 2013-01-23 NOTE — ED Notes (Signed)
Pt states that she "stays dizzy".  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  States that she has been shaky, and passing out x 2 months.

## 2013-01-23 NOTE — Progress Notes (Signed)
[facility] provided patient with a list of primary care resources in [place] and a [facility] [employer] application.  °

## 2013-01-24 LAB — GLUCOSE, CAPILLARY: Glucose-Capillary: 92 mg/dL (ref 70–99)

## 2013-04-12 ENCOUNTER — Encounter (HOSPITAL_COMMUNITY): Payer: Self-pay | Admitting: Emergency Medicine

## 2013-04-12 ENCOUNTER — Emergency Department (HOSPITAL_COMMUNITY)
Admission: EM | Admit: 2013-04-12 | Discharge: 2013-04-12 | Discharge status: Against Medical Advice | Payer: Self-pay | Attending: Emergency Medicine | Admitting: Emergency Medicine

## 2013-04-12 DIAGNOSIS — W260XXA Contact with knife, initial encounter: Secondary | ICD-10-CM | POA: Insufficient documentation

## 2013-04-12 DIAGNOSIS — F17200 Nicotine dependence, unspecified, uncomplicated: Secondary | ICD-10-CM | POA: Insufficient documentation

## 2013-04-12 DIAGNOSIS — Y9289 Other specified places as the place of occurrence of the external cause: Secondary | ICD-10-CM | POA: Insufficient documentation

## 2013-04-12 DIAGNOSIS — W268XXA Contact with other sharp object(s), not elsewhere classified, initial encounter: Secondary | ICD-10-CM | POA: Insufficient documentation

## 2013-04-12 DIAGNOSIS — Y9389 Activity, other specified: Secondary | ICD-10-CM | POA: Insufficient documentation

## 2013-04-12 DIAGNOSIS — S61509A Unspecified open wound of unspecified wrist, initial encounter: Secondary | ICD-10-CM | POA: Insufficient documentation

## 2013-04-12 NOTE — ED Notes (Signed)
Pt is A&O x3.  Maintains that she accidentally punctured wrist while slamming glass into sink.  Rates pain 10/10 described as constant sharp ache.

## 2013-04-12 NOTE — ED Notes (Signed)
Pt walked out AMA, this RN attempted to talk to pt however pt refused to participate in conversation.

## 2013-04-12 NOTE — ED Notes (Signed)
Pt arrived to ED with a laceration/puncture wound to the left wrist.  Pt states she was slamming a glass into the sink and punctured her wrist on an upturned steak knife.  Per EMS the puncture wound is arterial but bleeding has been controlled.  Per EMS the husband stated the pt has a psych hx.  And that this action was deliberate. Pt denies said action.

## 2013-04-13 ENCOUNTER — Emergency Department (HOSPITAL_COMMUNITY)
Admission: EM | Admit: 2013-04-13 | Discharge: 2013-04-13 | Disposition: A | Discharge status: Home / Self Care | Payer: Self-pay | Attending: Emergency Medicine | Admitting: Emergency Medicine

## 2013-04-13 ENCOUNTER — Encounter (HOSPITAL_COMMUNITY): Payer: Self-pay | Admitting: Emergency Medicine

## 2013-04-13 DIAGNOSIS — W2209XA Striking against other stationary object, initial encounter: Secondary | ICD-10-CM | POA: Insufficient documentation

## 2013-04-13 DIAGNOSIS — F411 Generalized anxiety disorder: Secondary | ICD-10-CM | POA: Insufficient documentation

## 2013-04-13 DIAGNOSIS — Z8719 Personal history of other diseases of the digestive system: Secondary | ICD-10-CM | POA: Insufficient documentation

## 2013-04-13 DIAGNOSIS — S61509A Unspecified open wound of unspecified wrist, initial encounter: Secondary | ICD-10-CM | POA: Insufficient documentation

## 2013-04-13 DIAGNOSIS — F17200 Nicotine dependence, unspecified, uncomplicated: Secondary | ICD-10-CM | POA: Insufficient documentation

## 2013-04-13 DIAGNOSIS — Y929 Unspecified place or not applicable: Secondary | ICD-10-CM | POA: Insufficient documentation

## 2013-04-13 DIAGNOSIS — Y9389 Activity, other specified: Secondary | ICD-10-CM | POA: Insufficient documentation

## 2013-04-13 DIAGNOSIS — I10 Essential (primary) hypertension: Secondary | ICD-10-CM | POA: Insufficient documentation

## 2013-04-13 DIAGNOSIS — IMO0002 Reserved for concepts with insufficient information to code with codable children: Secondary | ICD-10-CM

## 2013-04-13 DIAGNOSIS — F3130 Bipolar disorder, current episode depressed, mild or moderate severity, unspecified: Secondary | ICD-10-CM | POA: Insufficient documentation

## 2013-04-13 MED ORDER — HYDROCODONE-ACETAMINOPHEN 5-325 MG PO TABS: 1.0000 | ORAL_TABLET | Freq: Four times a day (QID) | ORAL | Status: DC | PRN

## 2013-04-13 MED ORDER — HYDROCODONE-ACETAMINOPHEN 5-325 MG PO TABS
1.0000 | ORAL_TABLET | Freq: Once | ORAL | Status: AC
  Administered 2013-04-13: 1 via ORAL
  Filled 2013-04-13: qty 1

## 2013-04-13 MED ORDER — IBUPROFEN 600 MG PO TABS: 600.0000 mg | ORAL_TABLET | Freq: Four times a day (QID) | ORAL | Status: DC | PRN

## 2013-04-13 NOTE — ED Notes (Signed)
Adriana Spry, NP at bedside to suture. Pt. Numbness resolved after bandage removal.
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
## 2013-04-17 NOTE — ED Provider Notes (Signed)
Medical screening examination/treatment/procedure(s) were performed by non-physician practitioner and as supervising physician I was immediately available for consultation/collaboration.    Vida Roller, MD 04/17/13 [phone]

## 2013-06-20 ENCOUNTER — Emergency Department (HOSPITAL_COMMUNITY): Payer: Self-pay

## 2013-06-20 ENCOUNTER — Emergency Department (HOSPITAL_COMMUNITY)
Admission: EM | Admit: 2013-06-20 | Discharge: 2013-06-20 | Disposition: A | Discharge status: Home / Self Care | Payer: Self-pay | Attending: Emergency Medicine | Admitting: Emergency Medicine

## 2013-06-20 ENCOUNTER — Encounter (HOSPITAL_COMMUNITY): Payer: Self-pay | Admitting: Emergency Medicine

## 2013-06-20 DIAGNOSIS — R05 Cough: Secondary | ICD-10-CM

## 2013-06-20 DIAGNOSIS — N390 Urinary tract infection, site not specified: Secondary | ICD-10-CM | POA: Insufficient documentation

## 2013-06-20 DIAGNOSIS — Z3202 Encounter for pregnancy test, result negative: Secondary | ICD-10-CM | POA: Insufficient documentation

## 2013-06-20 DIAGNOSIS — Z79899 Other long term (current) drug therapy: Secondary | ICD-10-CM | POA: Insufficient documentation

## 2013-06-20 DIAGNOSIS — R6883 Chills (without fever): Secondary | ICD-10-CM | POA: Insufficient documentation

## 2013-06-20 DIAGNOSIS — F17200 Nicotine dependence, unspecified, uncomplicated: Secondary | ICD-10-CM | POA: Insufficient documentation

## 2013-06-20 DIAGNOSIS — I10 Essential (primary) hypertension: Secondary | ICD-10-CM | POA: Insufficient documentation

## 2013-06-20 DIAGNOSIS — R062 Wheezing: Secondary | ICD-10-CM | POA: Insufficient documentation

## 2013-06-20 DIAGNOSIS — F3130 Bipolar disorder, current episode depressed, mild or moderate severity, unspecified: Secondary | ICD-10-CM | POA: Insufficient documentation

## 2013-06-20 DIAGNOSIS — F411 Generalized anxiety disorder: Secondary | ICD-10-CM | POA: Insufficient documentation

## 2013-06-20 DIAGNOSIS — R059 Cough, unspecified: Secondary | ICD-10-CM | POA: Insufficient documentation

## 2013-06-20 DIAGNOSIS — K5900 Constipation, unspecified: Secondary | ICD-10-CM | POA: Insufficient documentation

## 2013-06-20 LAB — URINALYSIS, ROUTINE W REFLEX MICROSCOPIC
BILIRUBIN URINE: NEGATIVE
GLUCOSE, UA: NEGATIVE mg/dL
Ketones, ur: NEGATIVE mg/dL
Nitrite: NEGATIVE
PROTEIN: NEGATIVE mg/dL
Specific Gravity, Urine: 1.011 (ref 1.005–1.030)
Urobilinogen, UA: 0.2 mg/dL (ref 0.0–1.0)
pH: 6 (ref 5.0–8.0)

## 2013-06-20 LAB — BASIC METABOLIC PANEL
BUN: 8 mg/dL (ref 6–23)
CO2: 28 mEq/L (ref 19–32)
CREATININE: 0.78 mg/dL (ref 0.50–1.10)
Calcium: 9.1 mg/dL (ref 8.4–10.5)
Chloride: 101 mEq/L (ref 96–112)
GFR calc non Af Amer: >90 mL/min (ref >90)
Glucose, Bld: 98 mg/dL (ref 70–99)
Potassium: 4.1 mEq/L (ref 3.7–5.3)
Sodium: 139 mEq/L (ref 137–147)

## 2013-06-20 LAB — POCT PREGNANCY, URINE: Preg Test, Ur: NEGATIVE

## 2013-06-20 LAB — URINE MICROSCOPIC-ADD ON

## 2013-06-20 MED ORDER — ALBUTEROL SULFATE HFA 108 (90 BASE) MCG/ACT IN AERS
2.0000 | INHALATION_SPRAY | RESPIRATORY_TRACT | Status: DC | PRN
  Administered 2013-06-20: 2 via RESPIRATORY_TRACT
  Filled 2013-06-20: qty 6.7

## 2013-06-20 MED ORDER — TRAMADOL HCL 50 MG PO TABS: 50.0000 mg | ORAL_TABLET | Freq: Four times a day (QID) | ORAL | Status: DC | PRN

## 2013-06-20 MED ORDER — IPRATROPIUM BROMIDE 0.02 % IN SOLN
0.5000 mg | Freq: Once | RESPIRATORY_TRACT | Status: AC
  Administered 2013-06-20: 0.5 mg via RESPIRATORY_TRACT
  Filled 2013-06-20: qty 2.5

## 2013-06-20 MED ORDER — SULFAMETHOXAZOLE-TRIMETHOPRIM 800-160 MG PO TABS: 1.0000 | ORAL_TABLET | Freq: Two times a day (BID) | ORAL | Status: DC

## 2013-06-20 MED ORDER — ALBUTEROL SULFATE (2.5 MG/3ML) 0.083% IN NEBU
5.0000 mg | INHALATION_SOLUTION | Freq: Once | RESPIRATORY_TRACT | Status: AC
  Administered 2013-06-20: 5 mg via RESPIRATORY_TRACT
  Filled 2013-06-20: qty 6
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
## 2013-06-20 NOTE — ED Provider Notes (Signed)
Medical screening examination/treatment/procedure(s) were performed by non-physician practitioner and as supervising physician I was immediately available for consultation/collaboration.    Kathalene Frames, MD 06/20/13 [phone]

## 2013-06-20 NOTE — Discharge Instructions (Signed)
Please read and follow all provided instructions.  Your diagnoses today include:  1. Urinary tract infection   2. Cough     Tests performed today include:  Urine test - suggests that you have an infection in your bladder  Chest and abdominal x-ray - normal  Electrolytes and kidney function - negative  Vital signs. See below for your results today.   Medications prescribed:  Albuterol inhaler - medication that opens up your airway Use inhaler as follows: 1-2 puffs with spacer every 4 hours as needed for wheezing, cough, or shortness of breath.    Bactrim (trimethoprim/sulfamethoxazole) - antibiotic  You have been prescribed an antibiotic medicine: take the entire course of medicine even if you are feeling better. Stopping early can cause the antibiotic not to work.    Tramadol - narcotic-like pain medication  DO NOT drive or perform any activities that require you to be awake and alert because this medicine can make you drowsy.   Home care instructions:  Follow any educational materials contained in this packet.  Follow-up instructions: Please follow-up with your primary care provider in 3 days if symptoms are not resolved for further evaluation of your symptoms.  If you do not have a primary care doctor -- see below for referral information.   Return instructions:   Please return to the Emergency Department if you experience worsening symptoms.   Return with fever, worsening pain, persistent vomiting, worsening pain in your back.   Return with chest pain or worsening shortness of breath  Please return if you have any other emergent concerns.  Additional Information:  Your vital signs today were: BP 111/47   Pulse 76   Temp(Src) 98.4 F (36.9 C) (Oral)   Resp 15   SpO2 99%   LMP 06/01/2013 If your blood pressure (BP) was elevated above 135/85 this visit, please have this repeated by your doctor within one month. --------------

## 2013-06-20 NOTE — ED Notes (Signed)
Pt reports having a productive cough for several weeks, ,which the patient reports produces a brown/tan sputum. Pt reports upper back pain that has been going on for that amount of time. Pt also reports dysuria and hematuria, which started three days ago. Pt reports taking AZO to help with urinary discomfort, which only relieves the pain temporarily, which now the patient reports a constant burning sensation. Pt is A/O x4 and in NAD, vital signs are WDL.

## 2013-07-15 ENCOUNTER — Other Ambulatory Visit: Payer: Self-pay | Admitting: Family Medicine

## 2013-09-10 ENCOUNTER — Encounter: Payer: Self-pay | Admitting: Family Medicine

## 2013-09-10 ENCOUNTER — Ambulatory Visit (INDEPENDENT_AMBULATORY_CARE_PROVIDER_SITE_OTHER): Payer: 59 | Admitting: Family Medicine

## 2013-09-10 VITALS — BP 100/64 | Temp 99.1°F | Ht 64.5 in | Wt 163.0 lb

## 2013-09-10 DIAGNOSIS — F3130 Bipolar disorder, current episode depressed, mild or moderate severity, unspecified: Secondary | ICD-10-CM

## 2013-09-10 DIAGNOSIS — I471 Supraventricular tachycardia: Secondary | ICD-10-CM

## 2013-09-10 DIAGNOSIS — Z7189 Other specified counseling: Secondary | ICD-10-CM

## 2013-09-10 DIAGNOSIS — Z8742 Personal history of other diseases of the female genital tract: Secondary | ICD-10-CM

## 2013-09-10 DIAGNOSIS — N920 Excessive and frequent menstruation with regular cycle: Secondary | ICD-10-CM

## 2013-09-10 DIAGNOSIS — R059 Cough, unspecified: Secondary | ICD-10-CM

## 2013-09-10 DIAGNOSIS — I456 Pre-excitation syndrome: Secondary | ICD-10-CM

## 2013-09-10 DIAGNOSIS — F319 Bipolar disorder, unspecified: Secondary | ICD-10-CM

## 2013-09-10 DIAGNOSIS — F4310 Post-traumatic stress disorder, unspecified: Secondary | ICD-10-CM

## 2013-09-10 DIAGNOSIS — F3289 Other specified depressive episodes: Secondary | ICD-10-CM

## 2013-09-10 DIAGNOSIS — F341 Dysthymic disorder: Secondary | ICD-10-CM

## 2013-09-10 DIAGNOSIS — H109 Unspecified conjunctivitis: Secondary | ICD-10-CM

## 2013-09-10 DIAGNOSIS — R053 Chronic cough: Secondary | ICD-10-CM

## 2013-09-10 DIAGNOSIS — R05 Cough: Secondary | ICD-10-CM

## 2013-09-10 DIAGNOSIS — K589 Irritable bowel syndrome without diarrhea: Secondary | ICD-10-CM

## 2013-09-10 DIAGNOSIS — J441 Chronic obstructive pulmonary disease with (acute) exacerbation: Secondary | ICD-10-CM

## 2013-09-10 DIAGNOSIS — F32A Depression, unspecified: Secondary | ICD-10-CM

## 2013-09-10 DIAGNOSIS — F329 Major depressive disorder, single episode, unspecified: Secondary | ICD-10-CM

## 2013-09-10 DIAGNOSIS — Z7689 Persons encountering health services in other specified circumstances: Secondary | ICD-10-CM

## 2013-09-10 MED ORDER — PREDNISONE 20 MG PO TABS: 20.0000 mg | ORAL_TABLET | Freq: Every day | ORAL | Status: DC

## 2013-09-10 MED ORDER — ATENOLOL 50 MG PO TABS: 50.0000 mg | ORAL_TABLET | Freq: Every evening | ORAL | Status: DC

## 2013-09-10 MED ORDER — ALBUTEROL SULFATE HFA 108 (90 BASE) MCG/ACT IN AERS: 2.0000 | INHALATION_SPRAY | Freq: Four times a day (QID) | RESPIRATORY_TRACT | Status: DC | PRN

## 2013-09-10 MED ORDER — DOXYCYCLINE HYCLATE 100 MG PO CAPS: 100.0000 mg | ORAL_CAPSULE | Freq: Two times a day (BID) | ORAL | Status: DC

## 2013-09-10 NOTE — Patient Instructions (Addendum)
-We placed a number of referrals for you as discussed. It usually takes about 1-2 weeks to process and schedule this referral. If you have not heard from [place] regarding this appointment in 2 weeks please contact our office.  We recommend the following healthy lifestyle measures: - eat a healthy diet consisting of lots of vegetables, fruits, beans, nuts, seeds, healthy meats such as white chicken and fish and whole grains.  - avoid fried foods, fast food, processed foods, sodas, red meet and other fattening foods.  - get a least 150 minutes of aerobic exercise per week.   -As we discussed, we have prescribed a new medication for you at this appointment. We discussed the common and serious potential adverse effects of this medication and you can review these and more with the pharmacist when you pick up your medication.  Please follow the instructions for use carefully and notify us immediately if you have any problems taking this medication.   Follow up in: next few months for physical and labs  Chronic Obstructive Pulmonary Disease Chronic obstructive pulmonary disease (COPD) is a common lung condition in which airflow from the lungs is limited. COPD is a general term that can be used to describe many different lung problems that limit airflow, including both chronic bronchitis and emphysema. If you have COPD, your lung function will probably never return to normal, but there are measures you can take to improve lung function and make yourself feel better.  CAUSES   Smoking (common).   Exposure to secondhand smoke.   Genetic problems.  Chronic inflammatory lung diseases or recurrent infections. SYMPTOMS   Shortness of breath, especially with physical activity.   Deep, persistent (chronic) cough with a large amount of thick mucus.   Wheezing.   Rapid breaths (tachypnea).   Gray or bluish discoloration (cyanosis) of the skin, especially in fingers, toes, or lips.   Fatigue.    Weight loss.   Frequent infections or episodes when breathing symptoms become much worse (exacerbations).   Chest tightness. DIAGNOSIS  Your healthcare provider will take a medical history and perform a physical examination to make the initial diagnosis. Additional tests for COPD may include:   Lung (pulmonary) function tests.  Chest X-ray.  CT scan.  Blood tests. TREATMENT  Treatment available to help you feel better when you have COPD include:   Inhaler and nebulizer medicines. These help manage the symptoms of COPD and make your breathing more comfortable  Supplemental oxygen. Supplemental oxygen is only helpful if you have a low oxygen level in your blood.   Exercise and physical activity. These are beneficial for nearly all people with COPD. Some people may also benefit from a pulmonary rehabilitation program. HOME CARE INSTRUCTIONS   Take all medicines (inhaled or pills) as directed by your health care provider.  Only take over-the-counter or prescription medicines for pain, fever, or discomfort as directed by your health care provider.   Avoid over-the-counter medicines or cough syrups that dry up your airway (such as antihistamines) and slow down the elimination of secretions unless instructed otherwise by your healthcare provider.   If you are a smoker, the most important thing that you can do is stop smoking. Continuing to smoke will cause further lung damage and breathing trouble. Ask your health care provider for help with quitting smoking. He or she can direct you to community resources or hospitals that provide support.  Avoid exposure to irritants such as smoke, chemicals, and fumes that aggravate your breathing.  Use oxygen therapy and pulmonary rehabilitation if directed by your health care provider. If you require home oxygen therapy, ask your healthcare provider whether you should purchase a pulse oximeter to measure your oxygen level at home.    Avoid contact with individuals who have a contagious illness.  Avoid extreme temperature and humidity changes.  Eat healthy foods. Eating smaller, more frequent meals and resting before meals may help you maintain your strength.  Stay active, but balance activity with periods of rest. Exercise and physical activity will help you maintain your ability to do things you want to do.  Preventing infection and hospitalization is very important when you have COPD. Make sure to receive all the vaccines your health care provider recommends, especially the pneumococcal and influenza vaccines. Ask your healthcare provider whether you need a pneumonia vaccine.  Learn and use relaxation techniques to manage stress.  Learn and use controlled breathing techniques as directed by your health care provider. Controlled breathing techniques include:   Pursed lip breathing. Start by breathing in (inhaling) through your nose for 1 second. Then, purse your lips as if you were going to whistle and breathe out (exhale) through the pursed lips for 2 seconds.   Diaphragmatic breathing. Start by putting one hand on your abdomen just above your waist. Inhale slowly through your nose. The hand on your abdomen should move out. Then purse your lips and exhale slowly. You should be able to feel the hand on your abdomen moving in as you exhale.   Learn and use controlled coughing to clear mucus from your lungs. Controlled coughing is a series of short, progressive coughs. The steps of controlled coughing are:  1. Lean your head slightly forward.  2. Breathe in deeply using diaphragmatic breathing.  3. Try to hold your breath for 3 seconds.  4. Keep your mouth slightly open while coughing twice.  5. Spit any mucus out into a tissue.  6. Rest and repeat the steps once or twice as needed. SEEK MEDICAL CARE IF:   You are coughing up more mucus than usual.   There is a change in the color or thickness of your  mucus.   Your breathing is more labored than usual.   Your breathing is faster than usual.  SEEK IMMEDIATE MEDICAL CARE IF:   You have shortness of breath while you are resting.   You have shortness of breath that prevents you from:  Being able to talk.   Performing your usual physical activities.   You have chest pain lasting longer than 5 minutes.   Your skin color is more cyanotic than usual.  You measure low oxygen saturations for longer than 5 minutes with a pulse oximeter. MAKE SURE YOU:   Understand these instructions.  Will watch your condition.  Will get help right away if you are not doing well or get worse. Document Released: 03/02/2005 Document Revised: 03/13/2013 Document Reviewed: 01/17/2013 [facility] Patient Information 2014 [place], [place].  You Can Quit Smoking If you are ready to quit smoking or are thinking about it, congratulations! You have chosen to help yourself be healthier and live longer! There are lots of different ways to quit smoking. Nicotine gum, nicotine patches, a nicotine inhaler, or nicotine nasal spray can help with physical craving. Hypnosis, support groups, and medicines help break the habit of smoking. TIPS TO GET OFF AND STAY OFF CIGARETTES  Learn to predict your moods. Do not let a bad situation be your excuse to have a cigarette.  Some situations in your life might tempt you to have a cigarette.  Ask friends and co-workers not to smoke around you.  Make your home smoke-free.  Never have "just one" cigarette. It leads to wanting another and another. Remind yourself of your decision to quit.  On a card, make a list of your reasons for not smoking. Read it at least the same number of times a day as you have a cigarette. Tell yourself everyday, "I do not want to smoke. I choose not to smoke."  Ask someone at home or work to help you with your plan to quit smoking.  Have something planned after you eat or have a cup of coffee.  Take a walk or get other exercise to perk you up. This will help to keep you from overeating.  Try a relaxation exercise to calm you down and decrease your stress. Remember, you may be tense and nervous the first two weeks after you quit. This will pass.  Find new activities to keep your hands busy. Play with a pen, coin, or rubber band. Doodle or draw things on paper.  Brush your teeth right after eating. This will help cut down the craving for the taste of tobacco after meals. You can try mouthwash too.  Try gum, breath mints, or diet candy to keep something in your mouth. IF YOU SMOKE AND WANT TO QUIT:  Do not stock up on cigarettes. Never buy a carton. Wait until one pack is finished before you buy another.  Never carry cigarettes with you at work or at home.  Keep cigarettes as far away from you as possible. Leave them with someone else.  Never carry matches or a lighter with you.  Ask yourself, "Do I need this cigarette or is this just a reflex?"  Bet with someone that you can quit. Put cigarette money in a piggy bank every morning. If you smoke, you give up the money. If you do not smoke, by the end of the week, you keep the money.  Keep trying. It takes 21 days to change a habit!  Talk to your doctor about using medicines to help you quit. These include nicotine replacement gum, lozenges, or skin patches. Document Released: 03/19/2009 Document Revised: 08/15/2011 Document Reviewed: 03/19/2009 [facility] Patient Information 2014 [place].

## 2013-09-10 NOTE — Progress Notes (Signed)
Chief Complaint  Patient presents with  . Establish Care  . Conjunctivitis  . Anxiety    HPI:  Adriana Hall is here to establish care. Got insurance recently and last doctor not in network.   Bipolar 1, Anxiety, Depression, PTSD: -Reports used to go to [facility] and wants a referral to psychiatry as insurance requires this. Denies feeling out of control or SI or thoughts of self harm.   History of WPW and ablation and other heart issues: - areports saw cardiology in the past and was supposed to be followed by them but did not follow up and now wants referral to establish with [facility] cardiology - chronically has intermittent CP, SOB, intermittent palpitations. Reports this has all been for many years and evaluated in ED and told to follow up with cardiologist but did not have insurance.  -takes atenolol  IBS-C: -followed by GI  Last PCP and physical:  Has hx of abnormal pap and had treatment for this in the past in 2008. Also going through menopause and having heavy bleeding with periods and want referal to gyn as well as required by her insurance.  Pink Eye: -for seven weeks -has been going to [facility] and had several rounds of abx and other treatments and still persists - wants referral to optho -vision issues chronic and wears glasses, not sure if has changed, irritated red R eye  COPD?/Chronic cough/Chronic wheezing/Tobacco use: -chronic, requires prednisone and abx several times per year -current flare for several weeks -not interested in quitting smoking at this time until under better psychiatric care -wants to see pulmonologist  Has the following chronic problems and concerns today:  Patient Active Problem List   Diagnosis Date Noted  . IBS (irritable bowel syndrome) - followed by GI 09/10/2013  . Bipolar 1 disorder, depressed   . GERD (gastroesophageal reflux disease) 02/25/2011  . Migraines 09/07/2010  . Post traumatic stress disorder (PTSD) 09/07/2010  .  DDD (degenerative disc disease), cervical 09/07/2010  . Abnormal Pap smear and cervical HPV (human papillomavirus) 09/07/2010  . HYPERTRIGLYCERIDEMIA 07/30/2009  . ANXIETY DEPRESSION 07/30/2009  . TOBACCO ABUSE 07/30/2009  . HYPERTENSION, BENIGN 07/30/2009  . CAD 07/30/2009  . 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  . BRONCHITIS, ACUTE WITH BRONCHOSPASM 06/06/2009    Health Maintenance:  ROS: See pertinent positives and negatives per HPI.  Past Medical History  Diagnosis Date  . Anxiety     10 years  . Arrhythmia     5 years  . Headache(784.0)     Chronic for 30 yrs  . Depression     10 years  . Hypertension   . Colitis 03-01-2011    Colonoscopy  . Hemorrhoids 03-01-2011    Colonoscopy   . Bipolar 1 disorder, depressed     Family History  Problem Relation Age of Onset  . Pancreatic cancer Maternal Grandfather   . Liver cancer Maternal Grandfather   . Colon cancer Neg Hx   . Clotting disorder Mother   . Diabetes Paternal Grandmother     And PGF  . Heart disease Mother   . Other Father     Brain tumor    History   Social History  . Marital Status: Married    Spouse Name: N/A    Number of Children: 3  . Years of Education: N/A   Occupational History  . Self employed    Social History Main Topics  . Smoking status: Current Every Day Smoker --  1.00 packs/day for 13 years    Types: Cigarettes  . Smokeless tobacco: Never Used     Comment: information given on 03-01-11 and 09/10/13, has smoked 1.5 ppd for 20 years  . Alcohol Use: Yes     Comment: 3 times per year  . Drug Use: No  . Sexual Activity: None   Other Topics Concern  . 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 , Disp: , Rfl:  gabapentin (NEURONTIN) 100 MG capsule, Take 200 mg by mouth at bedtime., Disp: , Rfl: ;  hyoscyamine (LEVBID) 0.375 MG 12 hr tablet, Take 0.375 mg by mouth every 12 (twelve) hours as needed for cramping., Disp: , Rfl: ;  ibuprofen (ADVIL,MOTRIN) 200 MG tablet, Take 600-800 mg by mouth every 6 (six) hours as needed for pain. , Disp: , Rfl: ;  MELATONIN PO, Take 8 mg by mouth at bedtime., Disp: , Rfl:  Nutritional Supplements (ESTROVEN PO), Take 1 tablet by mouth daily. , Disp: , Rfl: ;  omeprazole (PRILOSEC OTC) 20 MG tablet, Take 20 mg by mouth at bedtime. , Disp: , Rfl: ;  pseudoephedrine (SUDAFED) 30 MG tablet, Take 30 mg by mouth every 4 (four) hours as needed for congestion (congestion)., Disp: , Rfl: ;  sodium phosphate (FLEET) enema, Place 1 enema rectally as needed (bowel movement.). follow package directions, Disp: , Rfl:  traZODone (DESYREL) 100 MG tablet, Take 200 mg by mouth at bedtime. , Disp: , Rfl: ;  vitamin B-12 (CYANOCOBALAMIN) 100 MCG tablet, Take 100 mcg by mouth daily., Disp: , Rfl: ;  acetaminophen (TYLENOL) 500 MG tablet, Take 500 mg by mouth every 6 (six) hours as needed for pain., Disp: , Rfl:  albuterol (PROVENTIL HFA;VENTOLIN HFA) 108 (90 BASE) MCG/ACT inhaler, Inhale 2 puffs into the lungs every 6 (six) hours as needed for wheezing or shortness of breath., Disp: 1 Inhaler, Rfl: 0;  doxycycline (VIBRAMYCIN) 100 MG capsule, Take 1 capsule (100 mg total) by mouth 2 (two) times daily., Disp: 20 capsule, Rfl: 0;  predniSONE (DELTASONE) 20 MG tablet, Take 1 tablet (20 mg total) by mouth daily with breakfast., Disp: 8 tablet,  Rfl: 0  EXAM:  Filed Vitals:   09/10/13 1113  BP: 100/64  Temp: 99.1 F (37.3 C)    Body mass index is 27.56 kg/(m^2).  GENERAL: vitals reviewed and listed above, alert, oriented, appears well hydrated and in no acute distress  HEENT: atraumatic, conjunttiva red R eye, PERRLA, visual acuity grossly intact, EOMI, no obvious abnormalities on inspection of external nose and ears  NECK: no obvious masses on inspection  LUNGS: difuse wheezing, prolonged respiration, no signs of resp distress  CV: HRRR, no peripheral edema  MS: moves all extremities without  noticeable abnormality  PSYCH: pleasant and cooperative, no obvious depression or anxiety  ASSESSMENT AND PLAN:  Discussed the following assessment and plan:  Depression - Plan: Ambulatory referral to Psychiatry  ANXIETY DEPRESSION - Plan: Ambulatory referral to Psychiatry  Post traumatic stress disorder (PTSD) - Plan: Ambulatory referral to Psychiatry  Bipolar 1 disorder, depressed - Plan: Ambulatory referral to Psychiatry  Encounter to establish care  Heavy menstrual bleeding - Plan: Ambulatory referral to Gynecology  History of abnormal cervical Pap smear - Plan: Ambulatory referral to Gynecology  IBS (irritable bowel syndrome) - followed by GI  Conjunctivitis - Plan: Ambulatory referral to Ophthalmology  COPD exacerbation - Plan: predniSONE (DELTASONE) 20 MG tablet, albuterol (PROVENTIL HFA;VENTOLIN HFA) 108 (90 BASE) MCG/ACT inhaler, doxycycline (VIBRAMYCIN) 100 MG capsule, Ambulatory referral to Pulmonology  Chronic cough - Plan: Ambulatory referral to Pulmonology  WPW - Plan: atenolol (TENORMIN) 50 MG tablet, Ambulatory referral to Cardiology  SVT/ PSVT/ PAT - Plan: atenolol (TENORMIN) 50 MG tablet, Ambulatory referral to Cardiology  -Pleasant 43 yo patient with a very complex medical history history, many issues today and requests for re-establishing with a number of specialist now that she has insurance.   -concerned about ongoing issues with eye and she wants to see optho and referral placed, emergent precautions -lung exam abnormal and tx for likely copd exacerbation and referral to pulm after discussion of chronic symptoms and for discussion lung cancer screening, advised to quit smoking and pamphlet provided -she prefers to do all labs with physical and advised to schedule this -she will get pap with gyn -We reviewed the PMH, PSH, FH, SH, Meds and Allergies. -We provided refills for any medications we will prescribe as needed. -We addressed current concerns per orders and patient instructions. -We have asked for records for pertinent exams, studies, vaccines and notes from previous providers. -We have advised patient to follow up per instructions below. ->45 minutes spent with this patient in counseling face to face  -Patient advised to return or notify a doctor immediately if symptoms worsen or persist or new concerns arise.  Patient Instructions  -We placed a number of referrals for you as discussed. It usually takes about 1-2 weeks to process and schedule this referral. If you have not heard from [place] regarding this appointment in 2 weeks please contact our office.  We recommend the following healthy lifestyle measures: - eat a healthy diet consisting of lots of vegetables, fruits, beans, nuts, seeds, healthy meats such as white chicken and fish and whole grains.  - avoid fried foods, fast food, processed foods, sodas, red meet and other fattening foods.  - get a least 150 minutes of aerobic exercise per week.   -As we discussed, we have prescribed a new medication for you at this appointment. We discussed the common and serious potential adverse effects of this medication and you can review these and more with the pharmacist when you pick up your medication.  Please follow the instructions for use carefully and notify us immediately if you have any problems taking this  medication.   Follow up in: next few months for physical and labs  Chronic Obstructive Pulmonary Disease Chronic obstructive pulmonary disease (COPD) is a common lung condition in which airflow from the lungs is limited. COPD is a general term that can be used to describe many different lung problems that limit airflow, including both chronic bronchitis and emphysema. If you have COPD, your lung function will probably never return to normal, but there are  measures you can take to improve lung function and make yourself feel better.  CAUSES   Smoking (common).   Exposure to secondhand smoke.   Genetic problems.  Chronic inflammatory lung diseases or recurrent infections. SYMPTOMS   Shortness of breath, especially with physical activity.   Deep, persistent (chronic) cough with a large amount of thick mucus.   Wheezing.   Rapid breaths (tachypnea).   Gray or bluish discoloration (cyanosis) of the skin, especially in fingers, toes, or lips.   Fatigue.   Weight loss.   Frequent infections or episodes when breathing symptoms become much worse (exacerbations).   Chest tightness. DIAGNOSIS  Your healthcare provider will take a medical history and perform a physical examination to make the initial diagnosis. Additional tests for COPD may include:   Lung (pulmonary) function tests.  Chest X-ray.  CT scan.  Blood tests. TREATMENT  Treatment available to help you feel better when you have COPD include:   Inhaler and nebulizer medicines. These help manage the symptoms of COPD and make your breathing more comfortable  Supplemental oxygen. Supplemental oxygen is only helpful if you have a low oxygen level in your blood.   Exercise and physical activity. These are beneficial for nearly all people with COPD. Some people may also benefit from a pulmonary rehabilitation program. HOME CARE INSTRUCTIONS   Take all medicines (inhaled or pills) as directed by your health  care provider.  Only take over-the-counter or prescription medicines for pain, fever, or discomfort as directed by your health care provider.   Avoid over-the-counter medicines or cough syrups that dry up your airway (such as antihistamines) and slow down the elimination of secretions unless instructed otherwise by your healthcare provider.   If you are a smoker, the most important thing that you can do is stop smoking. Continuing to smoke will cause further lung damage and breathing trouble. Ask your health care provider for help with quitting smoking. He or she can direct you to community resources or hospitals that provide support.  Avoid exposure to irritants such as smoke, chemicals, and fumes that aggravate your breathing.  Use oxygen therapy and pulmonary rehabilitation if directed by your health care provider. If you require home oxygen therapy, ask your healthcare provider whether you should purchase a pulse oximeter to measure your oxygen level at home.   Avoid contact with individuals who have a contagious illness.  Avoid extreme temperature and humidity changes.  Eat healthy foods. Eating smaller, more frequent meals and resting before meals may help you maintain your strength.  Stay active, but balance activity with periods of rest. Exercise and physical activity will help you maintain your ability to do things you want to do.  Preventing infection and hospitalization is very important when you have COPD. Make sure to receive all the vaccines your health care provider recommends, especially the pneumococcal and influenza vaccines. Ask your healthcare provider whether you need a pneumonia vaccine.  Learn and use relaxation techniques to manage stress.  Learn and use controlled breathing techniques as directed by your health care provider. Controlled breathing techniques include:   Pursed lip breathing. Start by breathing in (inhaling) through your nose for 1 second. Then,  purse your lips as if you were going to whistle and breathe out (exhale) through the pursed lips for 2 seconds.   Diaphragmatic breathing. Start by putting one hand on your abdomen just above your waist. Inhale slowly through your nose. The hand on your abdomen should move out. Then purse  your lips and exhale slowly. You should be able to feel the hand on your abdomen moving in as you exhale.   Learn and use controlled coughing to clear mucus from your lungs. Controlled coughing is a series of short, progressive coughs. The steps of controlled coughing are:  1. Lean your head slightly forward.  2. Breathe in deeply using diaphragmatic breathing.  3. Try to hold your breath for 3 seconds.  4. Keep your mouth slightly open while coughing twice.  5. Spit any mucus out into a tissue.  6. Rest and repeat the steps once or twice as needed. SEEK MEDICAL CARE IF:   You are coughing up more mucus than usual.   There is a change in the color or thickness of your mucus.   Your breathing is more labored than usual.   Your breathing is faster than usual.  SEEK IMMEDIATE MEDICAL CARE IF:   You have shortness of breath while you are resting.   You have shortness of breath that prevents you from:  Being able to talk.   Performing your usual physical activities.   You have chest pain lasting longer than 5 minutes.   Your skin color is more cyanotic than usual.  You measure low oxygen saturations for longer than 5 minutes with a pulse oximeter. MAKE SURE YOU:   Understand these instructions.  Will watch your condition.  Will get help right away if you are not doing well or get worse. Document Released: 03/02/2005 Document Revised: 03/13/2013 Document Reviewed: 01/17/2013 [facility] Patient Information 2014 [place], [place].  You Can Quit Smoking If you are ready to quit smoking or are thinking about it, congratulations! You have chosen to help yourself be healthier and live  longer! There are lots of different ways to quit smoking. Nicotine gum, nicotine patches, a nicotine inhaler, or nicotine nasal spray can help with physical craving. Hypnosis, support groups, and medicines help break the habit of smoking. TIPS TO GET OFF AND STAY OFF CIGARETTES  Learn to predict your moods. Do not let a bad situation be your excuse to have a cigarette. Some situations in your life might tempt you to have a cigarette.  Ask friends and co-workers not to smoke around you.  Make your home smoke-free.  Never have "just one" cigarette. It leads to wanting another and another. Remind yourself of your decision to quit.  On a card, make a list of your reasons for not smoking. Read it at least the same number of times a day as you have a cigarette. Tell yourself everyday, "I do not want to smoke. I choose not to smoke."  Ask someone at home or work to help you with your plan to quit smoking.  Have something planned after you eat or have a cup of coffee. Take a walk or get other exercise to perk you up. This will help to keep you from overeating.  Try a relaxation exercise to calm you down and decrease your stress. Remember, you may be tense and nervous the first two weeks after you quit. This will pass.  Find new activities to keep your hands busy. Play with a pen, coin, or rubber band. Doodle or draw things on paper.  Brush your teeth right after eating. This will help cut down the craving for the taste of tobacco after meals. You can try mouthwash too.  Try gum, breath mints, or diet candy to keep something in your mouth. IF YOU SMOKE AND WANT TO QUIT:  Do not stock up on cigarettes. Never buy a carton. Wait until one pack is finished before you buy another.  Never carry cigarettes with you at work or at home.  Keep cigarettes as far away from you as possible. Leave them with someone else.  Never carry matches or a lighter with you.  Ask yourself, "Do I need this cigarette  or is this just a reflex?"  Bet with someone that you can quit. Put cigarette money in a piggy bank every morning. If you smoke, you give up the money. If you do not smoke, by the end of the week, you keep the money.  Keep trying. It takes 21 days to change a habit!  Talk to your doctor about using medicines to help you quit. These include nicotine replacement gum, lozenges, or skin patches. Document Released: 03/19/2009 Document Revised: 08/15/2011 Document Reviewed: 03/19/2009 [facility] Patient Information 2014 [place], [place], [place]

## 2013-09-10 NOTE — Progress Notes (Signed)
Pre visit review using our clinic review tool, if applicable. No additional management support is needed unless otherwise documented below in the visit note. 

## 2013-09-11 ENCOUNTER — Telehealth: Payer: Self-pay | Admitting: Family Medicine

## 2013-09-11 NOTE — Telephone Encounter (Signed)
Relevant patient education assigned to patient using Emmi. ° °

## 2013-09-12 ENCOUNTER — Telehealth: Payer: Self-pay | Admitting: Obstetrics and Gynecology

## 2013-09-12 NOTE — Telephone Encounter (Signed)
Called patient and left message to call me back to schedule a doctor referral appointment.

## 2013-09-16 NOTE — Telephone Encounter (Signed)
lmtcb/[place] °

## 2013-09-20 NOTE — Telephone Encounter (Signed)
Scheduled

## 2013-09-23 ENCOUNTER — Encounter: Payer: Self-pay | Admitting: Family Medicine

## 2013-09-23 ENCOUNTER — Telehealth: Payer: Self-pay | Admitting: Family Medicine

## 2013-09-23 ENCOUNTER — Ambulatory Visit (INDEPENDENT_AMBULATORY_CARE_PROVIDER_SITE_OTHER): Payer: 59 | Admitting: Family Medicine

## 2013-09-23 VITALS — BP 102/62 | HR 78 | Temp 99.2°F | Wt 157.0 lb

## 2013-09-23 DIAGNOSIS — F341 Dysthymic disorder: Secondary | ICD-10-CM

## 2013-09-23 DIAGNOSIS — H109 Unspecified conjunctivitis: Secondary | ICD-10-CM

## 2013-09-23 DIAGNOSIS — J209 Acute bronchitis, unspecified: Secondary | ICD-10-CM

## 2013-09-23 NOTE — Progress Notes (Signed)
Pre visit review using our clinic review tool, if applicable. No additional management support is needed unless otherwise documented below in the visit note. 
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
## 2013-09-23 NOTE — Telephone Encounter (Signed)
Patient Information:  Caller Name: Fritz Pickerel  Phone: [phone]  Patient: Adriana Hall  Gender: Female  DOB: 19-Dec-1970  Age: 43 Years  PCP: Maudie Mercury (TEXT 1st, after 20 mins can call), [facility])  Pregnant: No  Office Follow Up:  Does the office need to follow up with this patient?: No  Instructions For The Office: N/A  RN Note:  Appt. scheduled for today at 1600.  Symptoms  Reason For Call & Symptoms: Still with productive cough, difficulty breathing; Redness to the right eye is coming back;  Temp 99 orally at 1400 09/23/13;  Patient was seen in office 09/10/13 and prescribed Prednisone and Doxycycline but has been unable to take these meds due to upset stomach;  She has tried taking with food, increasing water and nothing helps so she didn't take them.  She is using the inhaler which does help.  Reviewed Health History In EMR: Yes  Reviewed Medications In EMR: Yes  Reviewed Allergies In EMR: Yes  Reviewed Surgeries / Procedures: Yes  Date of Onset of Symptoms: 09/10/2013  Treatments Tried: Prednisone, Doxycycline, Inhaler which does help.  Treatments Tried Worked: No OB / GYN:  LMP: 09/13/2013  Guideline(s) Used:  Cough  Disposition Per Guideline:   See Today in Office  Reason For Disposition Reached:   Known COPD or other severe lung disease (i.e., bronchiectasis, cystic fibrosis, lung surgery) and worsening symptoms (i.e., increased sputum purulence or amount, increased breathing difficulty)  Advice Given:  Call Back If:  Difficulty breathing  You become worse.  Patient Will Follow Care Advice:  YES  Appointment Scheduled:  09/23/2013 16:00:00 Appointment Scheduled Provider:  Maudie Mercury (TEXT 1st, after 20 mins can call), [facility])

## 2013-09-24 ENCOUNTER — Telehealth: Payer: Self-pay | Admitting: Family Medicine

## 2013-09-24 NOTE — Telephone Encounter (Signed)
Relevant patient education assigned to patient using Emmi. ° °

## 2013-09-26 ENCOUNTER — Telehealth: Payer: Self-pay | Admitting: Family Medicine

## 2013-09-26 NOTE — Telephone Encounter (Addendum)
Patient dismissed from [facility] by Colin Benton DO , effective September 23, 2013. Dismissal letter sent out by certified / registered mail. DAJ  Received signed domestic return receipt verifying delivery of certified letter on September 30, 2013. Article number [id number] [phone] [place]

## 2013-09-30 ENCOUNTER — Institutional Professional Consult (permissible substitution): Payer: 59 | Admitting: Pulmonary Disease

## 2013-10-09 ENCOUNTER — Ambulatory Visit: Payer: 59 | Admitting: Cardiology

## 2013-10-09 ENCOUNTER — Ambulatory Visit: Payer: 59 | Admitting: Obstetrics and Gynecology

## 2013-10-09 ENCOUNTER — Telehealth: Payer: Self-pay | Admitting: Obstetrics and Gynecology

## 2013-10-09 NOTE — Telephone Encounter (Signed)
Patient DNKA a NGYN appt at 1:30. She was a doctor referral. Normally for new patients we would dismiss them. Do you want to do the same for this patient?

## 2013-10-09 NOTE — Telephone Encounter (Signed)
Yes, I do want to dismiss the patient from our practice. By the way, she was just dismissed a few days ago from Dr. Julianne Rice practice, the referring physician.

## 2013-10-16 ENCOUNTER — Institutional Professional Consult (permissible substitution): Payer: 59 | Admitting: Pulmonary Disease

## 2013-10-18 ENCOUNTER — Encounter: Payer: Self-pay | Admitting: Cardiology

## 2013-10-20 ENCOUNTER — Encounter (HOSPITAL_COMMUNITY): Payer: Self-pay | Admitting: Emergency Medicine

## 2013-10-20 ENCOUNTER — Emergency Department (HOSPITAL_COMMUNITY)
Admission: EM | Admit: 2013-10-20 | Discharge: 2013-10-21 | Disposition: A | Discharge status: Other Acute Inpatient Hospital | Payer: 59 | Attending: Emergency Medicine | Admitting: Emergency Medicine

## 2013-10-20 DIAGNOSIS — F319 Bipolar disorder, unspecified: Secondary | ICD-10-CM

## 2013-10-20 DIAGNOSIS — F3130 Bipolar disorder, current episode depressed, mild or moderate severity, unspecified: Secondary | ICD-10-CM | POA: Insufficient documentation

## 2013-10-20 DIAGNOSIS — F411 Generalized anxiety disorder: Secondary | ICD-10-CM | POA: Insufficient documentation

## 2013-10-20 DIAGNOSIS — R45851 Suicidal ideations: Secondary | ICD-10-CM

## 2013-10-20 DIAGNOSIS — I10 Essential (primary) hypertension: Secondary | ICD-10-CM | POA: Insufficient documentation

## 2013-10-20 DIAGNOSIS — Z9889 Other specified postprocedural states: Secondary | ICD-10-CM | POA: Insufficient documentation

## 2013-10-20 DIAGNOSIS — F1910 Other psychoactive substance abuse, uncomplicated: Secondary | ICD-10-CM

## 2013-10-20 DIAGNOSIS — F32A Depression, unspecified: Secondary | ICD-10-CM

## 2013-10-20 DIAGNOSIS — Z79899 Other long term (current) drug therapy: Secondary | ICD-10-CM | POA: Insufficient documentation

## 2013-10-20 DIAGNOSIS — Z8719 Personal history of other diseases of the digestive system: Secondary | ICD-10-CM | POA: Insufficient documentation

## 2013-10-20 DIAGNOSIS — F17200 Nicotine dependence, unspecified, uncomplicated: Secondary | ICD-10-CM | POA: Insufficient documentation

## 2013-10-20 DIAGNOSIS — IMO0002 Reserved for concepts with insufficient information to code with codable children: Secondary | ICD-10-CM | POA: Insufficient documentation

## 2013-10-20 DIAGNOSIS — F329 Major depressive disorder, single episode, unspecified: Secondary | ICD-10-CM

## 2013-10-20 LAB — CBC
HEMATOCRIT: 45.5 % (ref 36.0–46.0)
HEMOGLOBIN: 16.1 g/dL — AB (ref 12.0–15.0)
MCH: 32.5 pg (ref 26.0–34.0)
MCHC: 35.4 g/dL (ref 30.0–36.0)
MCV: 91.9 fL (ref 78.0–100.0)
Platelets: 291 10*3/uL (ref 150–400)
RBC: 4.95 MIL/uL (ref 3.87–5.11)
RDW: 14.2 % (ref 11.5–15.5)
WBC: 14.7 10*3/uL — AB (ref 4.0–10.5)

## 2013-10-20 LAB — ETHANOL

## 2013-10-20 LAB — COMPREHENSIVE METABOLIC PANEL
ALT: 11 U/L (ref 0–35)
AST: 15 U/L (ref 0–37)
Albumin: 3.7 g/dL (ref 3.5–5.2)
Alkaline Phosphatase: 55 U/L (ref 39–117)
BUN: 7 mg/dL (ref 6–23)
CHLORIDE: 100 meq/L (ref 96–112)
CO2: 25 mEq/L (ref 19–32)
Calcium: 9.5 mg/dL (ref 8.4–10.5)
Creatinine, Ser: 0.77 mg/dL (ref 0.50–1.10)
GFR calc Af Amer: >90 mL/min (ref >90)
GFR calc non Af Amer: >90 mL/min (ref >90)
GLUCOSE: 133 mg/dL — AB (ref 70–99)
Potassium: 3.7 mEq/L (ref 3.7–5.3)
Sodium: 138 mEq/L (ref 137–147)
Total Protein: 7.2 g/dL (ref 6.0–8.3)

## 2013-10-20 LAB — SALICYLATE LEVEL: SALICYLATE LVL: 2.5 mg/dL — AB (ref 2.8–20.0)

## 2013-10-20 LAB — RAPID URINE DRUG SCREEN, HOSP PERFORMED
Amphetamines: NOT DETECTED
Barbiturates: NOT DETECTED
Benzodiazepines: POSITIVE — AB
Cocaine: NOT DETECTED
Opiates: NOT DETECTED
Tetrahydrocannabinol: POSITIVE — AB

## 2013-10-20 LAB — ACETAMINOPHEN LEVEL

## 2013-10-20 MED ORDER — DICYCLOMINE HCL 20 MG PO TABS
20.0000 mg | ORAL_TABLET | Freq: Four times a day (QID) | ORAL | Status: DC | PRN
  Administered 2013-10-20 – 2013-10-21 (×2): 20 mg via ORAL
  Filled 2013-10-20 (×2): qty 1

## 2013-10-20 MED ORDER — IBUPROFEN 200 MG PO TABS
600.0000 mg | ORAL_TABLET | Freq: Three times a day (TID) | ORAL | Status: DC | PRN
  Administered 2013-10-21: 600 mg via ORAL
  Filled 2013-10-20: qty 3

## 2013-10-20 MED ORDER — ZOLPIDEM TARTRATE 5 MG PO TABS: 5.0000 mg | ORAL_TABLET | Freq: Every evening | ORAL | Status: DC | PRN

## 2013-10-20 MED ORDER — HYDROXYZINE HCL 25 MG PO TABS
25.0000 mg | ORAL_TABLET | Freq: Four times a day (QID) | ORAL | Status: DC | PRN
  Administered 2013-10-20: 25 mg via ORAL
  Filled 2013-10-20: qty 1

## 2013-10-20 MED ORDER — TRAZODONE HCL 50 MG PO TABS
50.0000 mg | ORAL_TABLET | Freq: Every evening | ORAL | Status: DC | PRN
  Administered 2013-10-20: 50 mg via ORAL
  Filled 2013-10-20: qty 1

## 2013-10-20 MED ORDER — TRAZODONE HCL 100 MG PO TABS
200.0000 mg | ORAL_TABLET | Freq: Every day | ORAL | Status: DC
  Administered 2013-10-20: 200 mg via ORAL
  Filled 2013-10-20: qty 2

## 2013-10-20 MED ORDER — BUPROPION HCL ER (XL) 300 MG PO TB24
300.0000 mg | ORAL_TABLET | Freq: Every day | ORAL | Status: DC
  Administered 2013-10-21: 300 mg via ORAL
  Filled 2013-10-20: qty 1

## 2013-10-20 MED ORDER — CLONIDINE HCL 0.1 MG PO TABS: 0.1000 mg | ORAL_TABLET | Freq: Every day | ORAL | Status: DC

## 2013-10-20 MED ORDER — ONDANSETRON 4 MG PO TBDP
4.0000 mg | ORAL_TABLET | Freq: Four times a day (QID) | ORAL | Status: DC | PRN
  Administered 2013-10-20: 4 mg via ORAL
  Filled 2013-10-20: qty 1

## 2013-10-20 MED ORDER — NAPROXEN 500 MG PO TABS: 500.0000 mg | ORAL_TABLET | Freq: Two times a day (BID) | ORAL | Status: DC | PRN

## 2013-10-20 MED ORDER — ALUM & MAG HYDROXIDE-SIMETH 200-200-20 MG/5ML PO SUSP: 30.0000 mL | ORAL | Status: DC | PRN

## 2013-10-20 MED ORDER — METHOCARBAMOL 500 MG PO TABS: 500.0000 mg | ORAL_TABLET | Freq: Three times a day (TID) | ORAL | Status: DC | PRN

## 2013-10-20 MED ORDER — KETOTIFEN FUMARATE 0.025 % OP SOLN
1.0000 [drp] | Freq: Two times a day (BID) | OPHTHALMIC | Status: DC | PRN
  Filled 2013-10-20: qty 5

## 2013-10-20 MED ORDER — CLONIDINE HCL 0.1 MG PO TABS: 0.1000 mg | ORAL_TABLET | ORAL | Status: DC

## 2013-10-20 MED ORDER — CITALOPRAM HYDROBROMIDE 20 MG PO TABS
20.0000 mg | ORAL_TABLET | Freq: Every day | ORAL | Status: DC
  Administered 2013-10-20 – 2013-10-21 (×2): 20 mg via ORAL
  Filled 2013-10-20 (×2): qty 1

## 2013-10-20 MED ORDER — ALBUTEROL SULFATE HFA 108 (90 BASE) MCG/ACT IN AERS: 2.0000 | INHALATION_SPRAY | Freq: Four times a day (QID) | RESPIRATORY_TRACT | Status: DC | PRN

## 2013-10-20 MED ORDER — CLONIDINE HCL 0.1 MG PO TABS
0.1000 mg | ORAL_TABLET | Freq: Four times a day (QID) | ORAL | Status: DC
  Administered 2013-10-20 – 2013-10-21 (×3): 0.1 mg via ORAL
  Filled 2013-10-20 (×3): qty 1

## 2013-10-20 MED ORDER — LOPERAMIDE HCL 2 MG PO CAPS: 2.0000 mg | ORAL_CAPSULE | ORAL | Status: DC | PRN

## 2013-10-20 MED ORDER — GABAPENTIN 300 MG PO CAPS
300.0000 mg | ORAL_CAPSULE | Freq: Every day | ORAL | Status: DC
  Administered 2013-10-20: 300 mg via ORAL
  Filled 2013-10-20 (×2): qty 1

## 2013-10-20 MED ORDER — FLUTICASONE PROPIONATE 50 MCG/ACT NA SUSP
1.0000 | Freq: Every day | NASAL | Status: DC | PRN
  Filled 2013-10-20: qty 16

## 2013-10-20 MED ORDER — NICOTINE 21 MG/24HR TD PT24
21.0000 mg | MEDICATED_PATCH | Freq: Every day | TRANSDERMAL | Status: DC
  Administered 2013-10-20 – 2013-10-21 (×2): 21 mg via TRANSDERMAL
  Filled 2013-10-20 (×2): qty 1

## 2013-10-20 MED ORDER — PANTOPRAZOLE SODIUM 40 MG PO TBEC
40.0000 mg | DELAYED_RELEASE_TABLET | Freq: Every day | ORAL | Status: DC
  Administered 2013-10-20: 40 mg via ORAL
  Filled 2013-10-20: qty 1

## 2013-10-20 MED ORDER — HYOSCYAMINE SULFATE ER 0.375 MG PO TB12
0.3750 mg | ORAL_TABLET | Freq: Two times a day (BID) | ORAL | Status: DC | PRN
  Filled 2013-10-20: qty 1

## 2013-10-20 MED ORDER — ATENOLOL 50 MG PO TABS
50.0000 mg | ORAL_TABLET | Freq: Every evening | ORAL | Status: DC
  Administered 2013-10-20: 50 mg via ORAL
  Filled 2013-10-20 (×2): qty 1

## 2013-10-20 MED ORDER — ONDANSETRON HCL 4 MG PO TABS: 4.0000 mg | ORAL_TABLET | Freq: Three times a day (TID) | ORAL | Status: DC | PRN

## 2013-10-20 NOTE — ED Notes (Signed)
Pt eval by TTS, Laquesta.
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
## 2013-10-20 NOTE — ED Notes (Signed)
Pt states that she has been having medical problems with IBS, back problems x 3 years.  In the last few months has been getting opiates and xanax off the street to deal with her pain and mental issues.  States that she was taken off cymbalta cold [place] and that is when she started using the xanax.  Has been dismissed from her PCP "for being rude" because PCP would not give her a referral to a psychiatrist.  Pt states that she wants to lay down and die and would be happy.  States that she does not feel homicidal but if someone were to get in her face and mess with her, she would want to beat them to death.  Denies any other drugs.  Drinks etoh occasionally.

## 2013-10-20 NOTE — ED Notes (Signed)
Pt has been changed into blue scrubs pt wishes to have belongings go home with husband

## 2013-10-20 NOTE — ED Notes (Signed)
Husband at bedside with pt at present.

## 2013-10-20 NOTE — ED Notes (Signed)
Pt presents from triage, requesting Detox after abusing oxycodone and Xanax.  Complaint of chronic back pain x 3 years.  Denies SI or HI, no AV hallucinations.  Diagnosed with Manic Depression, Bipolar DO, PTSD and Anxiety.  Feeling hopeless, crying and anxious.  Pt briefly visited by husband.  Cooperative at present,  Complaint of nausea, meds given.

## 2013-10-20 NOTE — ED Notes (Signed)
Report called to psych ED to Charleston Ropes, RN
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
## 2013-10-20 NOTE — ED Notes (Signed)
Pt has been seen and wanded by security.  Pt's husband is taking personal items.

## 2013-10-20 NOTE — BH Assessment (Signed)
Spoke with Domenic Moras, PA-C who stated that pt is here requesting help with her substance abuse and depression. Pt has passive SI but no identified plan. Pt has been abusing Xanax and Oxycontin. Assessment will be initiated.

## 2013-10-21 ENCOUNTER — Encounter (HOSPITAL_COMMUNITY): Payer: Self-pay

## 2013-10-21 ENCOUNTER — Inpatient Hospital Stay (HOSPITAL_COMMUNITY)
Admission: AD | Admit: 2013-10-21 | Discharge: 2013-10-25 | DRG: 897 | Disposition: A | Discharge status: Home / Self Care | Payer: 59 | Source: Intra-hospital | Attending: Psychiatry | Admitting: Psychiatry

## 2013-10-21 DIAGNOSIS — F4310 Post-traumatic stress disorder, unspecified: Secondary | ICD-10-CM

## 2013-10-21 DIAGNOSIS — F1994 Other psychoactive substance use, unspecified with psychoactive substance-induced mood disorder: Secondary | ICD-10-CM | POA: Diagnosis present

## 2013-10-21 DIAGNOSIS — Z8249 Family history of ischemic heart disease and other diseases of the circulatory system: Secondary | ICD-10-CM

## 2013-10-21 DIAGNOSIS — Z800 Family history of malignant neoplasm of digestive organs: Secondary | ICD-10-CM

## 2013-10-21 DIAGNOSIS — F3130 Bipolar disorder, current episode depressed, mild or moderate severity, unspecified: Secondary | ICD-10-CM | POA: Diagnosis present

## 2013-10-21 DIAGNOSIS — F1320 Sedative, hypnotic or anxiolytic dependence, uncomplicated: Principal | ICD-10-CM

## 2013-10-21 DIAGNOSIS — I456 Pre-excitation syndrome: Secondary | ICD-10-CM

## 2013-10-21 DIAGNOSIS — F1920 Other psychoactive substance dependence, uncomplicated: Secondary | ICD-10-CM | POA: Diagnosis present

## 2013-10-21 DIAGNOSIS — G2581 Restless legs syndrome: Secondary | ICD-10-CM | POA: Diagnosis present

## 2013-10-21 DIAGNOSIS — F1120 Opioid dependence, uncomplicated: Secondary | ICD-10-CM

## 2013-10-21 DIAGNOSIS — I471 Supraventricular tachycardia, unspecified: Secondary | ICD-10-CM

## 2013-10-21 DIAGNOSIS — F339 Major depressive disorder, recurrent, unspecified: Secondary | ICD-10-CM

## 2013-10-21 DIAGNOSIS — F1220 Cannabis dependence, uncomplicated: Secondary | ICD-10-CM | POA: Diagnosis present

## 2013-10-21 DIAGNOSIS — G4700 Insomnia, unspecified: Secondary | ICD-10-CM | POA: Diagnosis present

## 2013-10-21 DIAGNOSIS — J45909 Unspecified asthma, uncomplicated: Secondary | ICD-10-CM | POA: Diagnosis present

## 2013-10-21 DIAGNOSIS — F411 Generalized anxiety disorder: Secondary | ICD-10-CM | POA: Diagnosis present

## 2013-10-21 DIAGNOSIS — Z833 Family history of diabetes mellitus: Secondary | ICD-10-CM

## 2013-10-21 DIAGNOSIS — F17200 Nicotine dependence, unspecified, uncomplicated: Secondary | ICD-10-CM | POA: Diagnosis present

## 2013-10-21 DIAGNOSIS — I10 Essential (primary) hypertension: Secondary | ICD-10-CM | POA: Diagnosis present

## 2013-10-21 DIAGNOSIS — K589 Irritable bowel syndrome without diarrhea: Secondary | ICD-10-CM | POA: Diagnosis present

## 2013-10-21 DIAGNOSIS — G43909 Migraine, unspecified, not intractable, without status migrainosus: Secondary | ICD-10-CM | POA: Diagnosis present

## 2013-10-21 HISTORY — DX: Bronchitis, not specified as acute or chronic: J40

## 2013-10-21 MED ORDER — ACETAMINOPHEN 325 MG PO TABS
650.0000 mg | ORAL_TABLET | Freq: Four times a day (QID) | ORAL | Status: DC | PRN
  Administered 2013-10-22: 650 mg via ORAL
  Filled 2013-10-21: qty 2

## 2013-10-21 MED ORDER — ALUM & MAG HYDROXIDE-SIMETH 200-200-20 MG/5ML PO SUSP
30.0000 mL | ORAL | Status: DC | PRN
  Administered 2013-10-25: 30 mL via ORAL

## 2013-10-21 MED ORDER — VITAMIN B-1 100 MG PO TABS
100.0000 mg | ORAL_TABLET | Freq: Every day | ORAL | Status: DC
  Administered 2013-10-22 – 2013-10-25 (×4): 100 mg via ORAL
  Filled 2013-10-21 (×6): qty 1

## 2013-10-21 MED ORDER — ONDANSETRON 4 MG PO TBDP
4.0000 mg | ORAL_TABLET | Freq: Four times a day (QID) | ORAL | Status: AC | PRN
  Administered 2013-10-23 – 2013-10-24 (×2): 4 mg via ORAL
  Filled 2013-10-21 (×2): qty 1

## 2013-10-21 MED ORDER — ADULT MULTIVITAMIN W/MINERALS CH
1.0000 | ORAL_TABLET | Freq: Every day | ORAL | Status: DC
  Administered 2013-10-21: 1 via ORAL
  Filled 2013-10-21: qty 1

## 2013-10-21 MED ORDER — CLONIDINE HCL 0.1 MG PO TABS
0.1000 mg | ORAL_TABLET | ORAL | Status: DC
  Filled 2013-10-21 (×2): qty 1

## 2013-10-21 MED ORDER — HYOSCYAMINE SULFATE ER 0.375 MG PO TB12
0.3750 mg | ORAL_TABLET | Freq: Two times a day (BID) | ORAL | Status: DC | PRN
  Filled 2013-10-21: qty 1

## 2013-10-21 MED ORDER — BUPROPION HCL ER (XL) 300 MG PO TB24
300.0000 mg | ORAL_TABLET | Freq: Every day | ORAL | Status: DC
  Administered 2013-10-22 – 2013-10-25 (×4): 300 mg via ORAL
  Filled 2013-10-21: qty 4
  Filled 2013-10-21 (×6): qty 1

## 2013-10-21 MED ORDER — CHLORDIAZEPOXIDE HCL 25 MG PO CAPS: 25.0000 mg | ORAL_CAPSULE | Freq: Four times a day (QID) | ORAL | Status: AC | PRN

## 2013-10-21 MED ORDER — TRAZODONE HCL 100 MG PO TABS
200.0000 mg | ORAL_TABLET | Freq: Every day | ORAL | Status: DC
  Administered 2013-10-21 – 2013-10-24 (×4): 200 mg via ORAL
  Filled 2013-10-21: qty 8
  Filled 2013-10-21 (×7): qty 2

## 2013-10-21 MED ORDER — PNEUMOCOCCAL VAC POLYVALENT 25 MCG/0.5ML IJ INJ
0.5000 mL | INJECTION | INTRAMUSCULAR | Status: AC
  Administered 2013-10-22: 0.5 mL via INTRAMUSCULAR

## 2013-10-21 MED ORDER — FLUTICASONE PROPIONATE 50 MCG/ACT NA SUSP: 1.0000 | Freq: Every day | NASAL | Status: DC | PRN

## 2013-10-21 MED ORDER — CHLORDIAZEPOXIDE HCL 25 MG PO CAPS: 25.0000 mg | ORAL_CAPSULE | Freq: Every day | ORAL | Status: DC

## 2013-10-21 MED ORDER — LOPERAMIDE HCL 2 MG PO CAPS: 2.0000 mg | ORAL_CAPSULE | ORAL | Status: AC | PRN

## 2013-10-21 MED ORDER — CHLORDIAZEPOXIDE HCL 25 MG PO CAPS
25.0000 mg | ORAL_CAPSULE | Freq: Once | ORAL | Status: AC
  Administered 2013-10-21: 25 mg via ORAL
  Filled 2013-10-21: qty 1

## 2013-10-21 MED ORDER — ALUM & MAG HYDROXIDE-SIMETH 200-200-20 MG/5ML PO SUSP: 30.0000 mL | ORAL | Status: DC | PRN

## 2013-10-21 MED ORDER — CHLORDIAZEPOXIDE HCL 25 MG PO CAPS
25.0000 mg | ORAL_CAPSULE | Freq: Four times a day (QID) | ORAL | Status: DC
  Administered 2013-10-21 (×2): 25 mg via ORAL
  Filled 2013-10-21 (×2): qty 1

## 2013-10-21 MED ORDER — IBUPROFEN 600 MG PO TABS
600.0000 mg | ORAL_TABLET | Freq: Three times a day (TID) | ORAL | Status: DC | PRN
  Administered 2013-10-22 – 2013-10-25 (×5): 600 mg via ORAL
  Filled 2013-10-21 (×6): qty 1

## 2013-10-21 MED ORDER — ALBUTEROL SULFATE HFA 108 (90 BASE) MCG/ACT IN AERS: 2.0000 | INHALATION_SPRAY | Freq: Four times a day (QID) | RESPIRATORY_TRACT | Status: DC | PRN

## 2013-10-21 MED ORDER — CHLORDIAZEPOXIDE HCL 25 MG PO CAPS
25.0000 mg | ORAL_CAPSULE | Freq: Three times a day (TID) | ORAL | Status: AC
  Administered 2013-10-23 (×3): 25 mg via ORAL
  Filled 2013-10-21 (×3): qty 1

## 2013-10-21 MED ORDER — VITAMIN B-1 100 MG PO TABS
100.0000 mg | ORAL_TABLET | Freq: Every day | ORAL | Status: DC
  Administered 2013-10-21: 100 mg via ORAL
  Filled 2013-10-21: qty 1

## 2013-10-21 MED ORDER — CLONIDINE HCL 0.1 MG PO TABS
0.1000 mg | ORAL_TABLET | Freq: Four times a day (QID) | ORAL | Status: DC
  Administered 2013-10-21 – 2013-10-23 (×6): 0.1 mg via ORAL
  Filled 2013-10-21 (×12): qty 1

## 2013-10-21 MED ORDER — KETOTIFEN FUMARATE 0.025 % OP SOLN
1.0000 [drp] | Freq: Two times a day (BID) | OPHTHALMIC | Status: DC | PRN
  Administered 2013-10-23 – 2013-10-25 (×4): 1 [drp] via OPHTHALMIC
  Filled 2013-10-21 (×2): qty 5

## 2013-10-21 MED ORDER — CHLORDIAZEPOXIDE HCL 25 MG PO CAPS: 25.0000 mg | ORAL_CAPSULE | ORAL | Status: DC

## 2013-10-21 MED ORDER — ONDANSETRON 4 MG PO TBDP: 4.0000 mg | ORAL_TABLET | Freq: Four times a day (QID) | ORAL | Status: DC | PRN

## 2013-10-21 MED ORDER — CLONIDINE HCL 0.1 MG PO TABS: 0.1000 mg | ORAL_TABLET | Freq: Every day | ORAL | Status: DC

## 2013-10-21 MED ORDER — CHLORDIAZEPOXIDE HCL 25 MG PO CAPS: 25.0000 mg | ORAL_CAPSULE | Freq: Three times a day (TID) | ORAL | Status: DC

## 2013-10-21 MED ORDER — ADULT MULTIVITAMIN W/MINERALS CH
1.0000 | ORAL_TABLET | Freq: Every day | ORAL | Status: DC
  Administered 2013-10-22 – 2013-10-25 (×4): 1 via ORAL
  Filled 2013-10-21 (×6): qty 1

## 2013-10-21 MED ORDER — CHLORDIAZEPOXIDE HCL 25 MG PO CAPS
25.0000 mg | ORAL_CAPSULE | Freq: Every day | ORAL | Status: AC
  Administered 2013-10-25: 25 mg via ORAL
  Filled 2013-10-21: qty 1

## 2013-10-21 MED ORDER — HYDROXYZINE HCL 25 MG PO TABS
25.0000 mg | ORAL_TABLET | Freq: Four times a day (QID) | ORAL | Status: AC | PRN
Start: 2013-10-21 — End: 2013-10-24
  Administered 2013-10-22: 25 mg via ORAL
  Filled 2013-10-21: qty 1

## 2013-10-21 MED ORDER — HYDROXYZINE HCL 25 MG PO TABS
25.0000 mg | ORAL_TABLET | Freq: Four times a day (QID) | ORAL | Status: DC | PRN
  Administered 2013-10-21: 25 mg via ORAL
  Filled 2013-10-21: qty 1

## 2013-10-21 MED ORDER — GABAPENTIN 300 MG PO CAPS
300.0000 mg | ORAL_CAPSULE | Freq: Every day | ORAL | Status: DC
  Administered 2013-10-21 – 2013-10-24 (×4): 300 mg via ORAL
  Filled 2013-10-21 (×3): qty 1
  Filled 2013-10-21: qty 4
  Filled 2013-10-21 (×4): qty 1

## 2013-10-21 MED ORDER — CHLORDIAZEPOXIDE HCL 25 MG PO CAPS: 25.0000 mg | ORAL_CAPSULE | Freq: Four times a day (QID) | ORAL | Status: DC | PRN

## 2013-10-21 MED ORDER — NICOTINE 21 MG/24HR TD PT24
21.0000 mg | MEDICATED_PATCH | Freq: Every day | TRANSDERMAL | Status: DC
  Administered 2013-10-23 – 2013-10-25 (×3): 21 mg via TRANSDERMAL
  Filled 2013-10-21 (×6): qty 1

## 2013-10-21 MED ORDER — CHLORDIAZEPOXIDE HCL 25 MG PO CAPS
25.0000 mg | ORAL_CAPSULE | Freq: Four times a day (QID) | ORAL | Status: AC
  Administered 2013-10-21 – 2013-10-22 (×4): 25 mg via ORAL
  Filled 2013-10-21 (×4): qty 1

## 2013-10-21 MED ORDER — CITALOPRAM HYDROBROMIDE 20 MG PO TABS
20.0000 mg | ORAL_TABLET | Freq: Every day | ORAL | Status: DC
  Administered 2013-10-22: 20 mg via ORAL
  Filled 2013-10-21 (×2): qty 1

## 2013-10-21 MED ORDER — THIAMINE HCL 100 MG/ML IJ SOLN: 100.0000 mg | Freq: Once | INTRAMUSCULAR | Status: DC

## 2013-10-21 MED ORDER — DICYCLOMINE HCL 20 MG PO TABS
20.0000 mg | ORAL_TABLET | Freq: Four times a day (QID) | ORAL | Status: DC | PRN
Start: 2013-10-21 — End: 2013-10-25
  Administered 2013-10-22: 20 mg via ORAL
  Filled 2013-10-21: qty 1

## 2013-10-21 MED ORDER — MAGNESIUM HYDROXIDE 400 MG/5ML PO SUSP: 30.0000 mL | Freq: Every day | ORAL | Status: DC | PRN

## 2013-10-21 MED ORDER — METHOCARBAMOL 500 MG PO TABS
500.0000 mg | ORAL_TABLET | Freq: Three times a day (TID) | ORAL | Status: DC | PRN
  Administered 2013-10-22 – 2013-10-23 (×2): 500 mg via ORAL
  Filled 2013-10-21 (×2): qty 1

## 2013-10-21 MED ORDER — LOPERAMIDE HCL 2 MG PO CAPS: 2.0000 mg | ORAL_CAPSULE | ORAL | Status: DC | PRN

## 2013-10-21 MED ORDER — CHLORDIAZEPOXIDE HCL 25 MG PO CAPS
25.0000 mg | ORAL_CAPSULE | ORAL | Status: AC
  Administered 2013-10-24 (×2): 25 mg via ORAL
  Filled 2013-10-21 (×2): qty 1

## 2013-10-21 MED ORDER — ATENOLOL 50 MG PO TABS
50.0000 mg | ORAL_TABLET | Freq: Every evening | ORAL | Status: DC
  Administered 2013-10-22 – 2013-10-24 (×3): 50 mg via ORAL
  Filled 2013-10-21 (×5): qty 1

## 2013-10-21 MED ORDER — PANTOPRAZOLE SODIUM 40 MG PO TBEC
40.0000 mg | DELAYED_RELEASE_TABLET | Freq: Every day | ORAL | Status: DC
  Administered 2013-10-21 – 2013-10-24 (×4): 40 mg via ORAL
  Filled 2013-10-21 (×7): qty 1

## 2013-10-21 NOTE — Progress Notes (Signed)
Adult Psychoeducational Group Note  Date:  10/21/2013 Time:  9:01 PM  Group Topic/Focus:  AA Group  Participation Level:  Active  Participation Quality:  Appropriate  Affect:  Appropriate  Cognitive:  Appropriate  Insight: Appropriate  Engagement in Group:  Engaged  Modes of Intervention:  Discussion  Additional Comments: Pt attended AA group  Takahiro Godinho 10/21/2013, 9:01 PM

## 2013-10-21 NOTE — Consult Note (Signed)
[facility] Consult   Reason for Consult:  depression Referring Physician:  ED physician  Adriana Hall is an 43 y.o. female. Total Time spent with patient: 30 minutes  Assessment: AXIS I:  Bipolar, Depressed and Post Traumatic Stress Disorder AXIS II:  Deferred AXIS III:   Past Medical History  Diagnosis Date  . Anxiety     10 years  . Arrhythmia     5 years  . Headache(784.0)     Chronic for 30 yrs  . Depression     10 years  . Hypertension   . Colitis 03-01-2011    Colonoscopy  . Hemorrhoids 03-01-2011    Colonoscopy   . Bipolar 1 disorder, depressed    AXIS IV:  other psychosocial or environmental problems and problems with primary support group AXIS V:  41-50 serious symptoms  Plan:  Recommend psychiatric Inpatient admission when medically cleared.  Subjective:   Adriana Hall is a 43 y.o. female patient admitted with depression and suicidal toughts.  HPI:  43 year old female with history of bipolar, anxiety, depression, substance abuse who presents for evaluations of substance abuse. Patient states she has been depressed for many years. For the past 3 years ago her doctor has tried numerous psychiatric medications to help with her symptoms but it has not helped. States she was taken off Cymbalta and Klonopin a year ago. Since then she has self medicated with Xanax, and OxyContin which was not prescribed to her. Her last use was today around 1:30pm. She does realized she is abusing it. She felt the medications has not helped her sxs and she would like to get help. She report feeling depressed, unable to find happiness in life. She felt she could die and it will be OK, although denies actives suicidal ideation or plan. She denies HI but sts if someone were to "get into my face i will beat them". She is a social drinker.  On evaluation today remains depressed and hopeless with crying spells.   HPI Elements:   Location:  depression. Quality:   moderate to severe.  Past Psychiatric History: Past Medical History  Diagnosis Date  . Anxiety     10 years  . Arrhythmia     5 years  . Headache(784.0)     Chronic for 30 yrs  . Depression     10 years  . Hypertension   . Colitis 03-01-2011    Colonoscopy  . Hemorrhoids 03-01-2011    Colonoscopy   . Bipolar 1 disorder, depressed     reports that she has been smoking Cigarettes.  She has a 13 pack-year smoking history. She has never used smokeless tobacco. She reports that she drinks alcohol. She reports that she does not use illicit drugs. Family History  Problem Relation Age of Onset  . Pancreatic cancer Maternal Grandfather   . Liver cancer Maternal Grandfather   . Colon cancer Neg Hx   . Clotting disorder Mother   . Diabetes Paternal Grandmother     And PGF  . Heart disease Mother   . Other Father     Brain tumor   Family History Substance Abuse: No Family Supports: Yes, List: (Husband, parent and son) Living Arrangements: Spouse/significant other;Children Can pt return to current living arrangement?: Yes Abuse/Neglect [facility]) Physical Abuse: Denies Verbal Abuse: Denies Sexual Abuse: Denies Allergies:   Allergies  Allergen Reactions  . Doxycycline Nausea And Vomiting    ACT Assessment Complete:  Yes:    Educational Status  Risk to Self: Risk to self Suicidal Ideation: No (Pt reported having fleeting thoughts but denies SI at preset) Suicidal Intent: No Is patient at risk for suicide?: No Suicidal Plan?: No Access to Means: No What has been your use of drugs/alcohol within the last 12 months?: daily  Previous Attempts/Gestures: No How many times?: 0 Other Self Harm Risks: burning self Triggers for Past Attempts: Unpredictable Intentional Self Injurious Behavior: Burning Comment - Self Injurious Behavior: burns wrist Family Suicide History: No Recent stressful life event(s): Financial Problems;Other (Comment) (Medical problems ) Persecutory  voices/beliefs?: No Depression: Yes Depression Symptoms: Despondent;Insomnia;Isolating;Fatigue;Guilt;Loss of interest in usual pleasures;Feeling worthless/self pity;Feeling angry/irritable Substance abuse history and/or treatment for substance abuse?: Yes Suicide prevention information given to non-admitted patients: Not applicable  Risk to Others: Risk to Others Homicidal Ideation: No Thoughts of Harm to Others: No Current Homicidal Intent: No Current Homicidal Plan: No Access to Homicidal Means: No Identified Victim: NA History of harm to others?: No Assessment of Violence: None Noted Violent Behavior Description: No violent behavior reported Does patient have access to weapons?: No Criminal Charges Pending?: No Does patient have a court date: No  Abuse: Abuse/Neglect Assessment (Assessment to be complete while patient is alone) Physical Abuse: Denies Verbal Abuse: Denies Sexual Abuse: Denies Exploitation of patient/patient's resources: Denies Self-Neglect: Denies  Prior Inpatient Therapy: Prior Inpatient Therapy Prior Inpatient Therapy: Yes Prior Therapy Dates: 1987 Prior Therapy Facilty/Provider(s): Behavior[facility]  Reason for Treatment: Depression  Prior Outpatient Therapy: Prior Outpatient Therapy Prior Outpatient Therapy: No  Additional Information: Additional Information 1:1 In Past 12 Months?: No CIRT Risk: No Elopement Risk: No Does patient have medical clearance?: Yes                  Objective: Blood pressure 134/73, pulse 69, temperature 97.8 F (36.6 C), temperature source Oral, resp. rate 14, last menstrual period 10/14/2013, SpO2 97.00%.There is no weight on file to calculate BMI. Results for orders placed during the hospital encounter of 10/20/13 (from the past 72 hour(s))  CBC     Status: Abnormal   Collection Time    10/20/13  5:53 PM      Result Value Ref Range   WBC 14.7 (*) 4.0 - 10.5 K/uL   RBC 4.95  3.87 - 5.11 MIL/uL    Hemoglobin 16.1 (*) 12.0 - 15.0 g/dL   HCT 45.5  36.0 - 46.0 %   MCV 91.9  78.0 - 100.0 fL   MCH 32.5  26.0 - 34.0 pg   MCHC 35.4  30.0 - 36.0 g/dL   RDW 14.2  11.5 - 15.5 %   Platelets 291  150 - 400 K/uL  COMPREHENSIVE METABOLIC PANEL     Status: Abnormal   Collection Time    10/20/13  5:53 PM      Result Value Ref Range   Sodium 138  137 - 147 mEq/L   Potassium 3.7  3.7 - 5.3 mEq/L   Chloride 100  96 - 112 mEq/L   CO2 25  19 - 32 mEq/L   Glucose, Bld 133 (*) 70 - 99 mg/dL   BUN 7  6 - 23 mg/dL   Creatinine, Ser 0.77  0.50 - 1.10 mg/dL   Calcium 9.5  8.4 - 10.5 mg/dL   Total Protein 7.2  6.0 - 8.3 g/dL   Albumin 3.7  3.5 - 5.2 g/dL   AST 15  0 - 37 U/L   ALT 11  0 - 35 U/L  Alkaline Phosphatase 55  39 - 117 U/L   Total Bilirubin <0.2 (*) 0.3 - 1.2 mg/dL   GFR calc non Af Amer >90  >90 mL/min   GFR calc Af Amer >90  >90 mL/min   Comment: (NOTE)     The eGFR has been calculated using the CKD EPI equation.     This calculation has not been validated in all clinical situations.     eGFR's persistently <90 mL/min signify possible Chronic Kidney     Disease.  ETHANOL     Status: None   Collection Time    10/20/13  5:53 PM      Result Value Ref Range   Alcohol, Ethyl (B) <11  0 - 11 mg/dL   Comment:            LOWEST DETECTABLE LIMIT FOR     SERUM ALCOHOL IS 11 mg/dL     FOR MEDICAL PURPOSES ONLY  ACETAMINOPHEN LEVEL     Status: None   Collection Time    10/20/13  6:20 PM      Result Value Ref Range   Acetaminophen (Tylenol), Serum <15.0  10 - 30 ug/mL   Comment:            THERAPEUTIC CONCENTRATIONS VARY     SIGNIFICANTLY. A RANGE OF 10-30     ug/mL MAY BE AN EFFECTIVE     CONCENTRATION FOR MANY PATIENTS.     HOWEVER, SOME ARE BEST TREATED     AT CONCENTRATIONS OUTSIDE THIS     RANGE.     ACETAMINOPHEN CONCENTRATIONS     >150 ug/mL AT 4 HOURS AFTER     INGESTION AND >50 ug/mL AT 12     HOURS AFTER INGESTION ARE     OFTEN ASSOCIATED WITH TOXIC     REACTIONS.   SALICYLATE LEVEL     Status: Abnormal   Collection Time    10/20/13  6:20 PM      Result Value Ref Range   Salicylate Lvl 2.5 (*) 2.8 - 20.0 mg/dL  URINE RAPID DRUG SCREEN (HOSP PERFORMED)     Status: Abnormal   Collection Time    10/20/13  6:59 PM      Result Value Ref Range   Opiates NONE DETECTED  NONE DETECTED   Cocaine NONE DETECTED  NONE DETECTED   Benzodiazepines POSITIVE (*) NONE DETECTED   Amphetamines NONE DETECTED  NONE DETECTED   Tetrahydrocannabinol POSITIVE (*) NONE DETECTED   Barbiturates NONE DETECTED  NONE DETECTED   Comment:            DRUG SCREEN FOR MEDICAL PURPOSES     ONLY.  IF CONFIRMATION IS NEEDED     FOR ANY PURPOSE, NOTIFY LAB     WITHIN 5 DAYS.                LOWEST DETECTABLE LIMITS     FOR URINE DRUG SCREEN     Drug Class       Cutoff (ng/mL)     Amphetamine      1000     Barbiturate      200     Benzodiazepine   480     Tricyclics       165     Opiates          300     Cocaine          300     THC  50   Labs are reviewed and are pertinent for marijuana.  Current Facility-Administered Medications  Medication Dose Route Frequency Provider Last Rate Last Dose  . albuterol (PROVENTIL HFA;VENTOLIN HFA) 108 (90 BASE) MCG/ACT inhaler 2 puff  2 puff Inhalation Q6H PRN Domenic Moras, PA-C      . alum & mag hydroxide-simeth (MAALOX/MYLANTA) 200-200-20 MG/5ML suspension 30 mL  30 mL Oral PRN Domenic Moras, PA-C      . atenolol (TENORMIN) tablet 50 mg  50 mg Oral QPM Domenic Moras, PA-C   50 mg at 10/20/13 2135  . buPROPion (WELLBUTRIN XL) 24 hr tablet 300 mg  300 mg Oral Daily Lurena Nida, NP   300 mg at 10/21/13 1014  . citalopram (CELEXA) tablet 20 mg  20 mg Oral Daily Domenic Moras, PA-C   20 mg at 10/21/13 1014  . cloNIDine (CATAPRES) tablet 0.1 mg  0.1 mg Oral QID Domenic Moras, PA-C   0.1 mg at 10/21/13 1014   Followed by  . [START ON 10/23/2013] cloNIDine (CATAPRES) tablet 0.1 mg  0.1 mg Oral BH-qamhs Domenic Moras, PA-C       Followed by  . [START  ON 10/25/2013] cloNIDine (CATAPRES) tablet 0.1 mg  0.1 mg Oral QAC breakfast Domenic Moras, PA-C      . dicyclomine (BENTYL) tablet 20 mg  20 mg Oral Q6H PRN Domenic Moras, PA-C   20 mg at 10/20/13 2011  . fluticasone (FLONASE) 50 MCG/ACT nasal spray 1 spray  1 spray Each Nare Daily PRN Domenic Moras, PA-C      . gabapentin (NEURONTIN) capsule 300 mg  300 mg Oral QHS Domenic Moras, PA-C   300 mg at 10/20/13 2135  . hydrOXYzine (ATARAX/VISTARIL) tablet 25 mg  25 mg Oral Q6H PRN Domenic Moras, PA-C   25 mg at 10/20/13 2011  . hyoscyamine (LEVBID) 0.375 MG 12 hr tablet 0.375 mg  0.375 mg Oral Q12H PRN Domenic Moras, PA-C      . ibuprofen (ADVIL,MOTRIN) tablet 600 mg  600 mg Oral Q8H PRN Domenic Moras, PA-C   600 mg at 10/21/13 1014  . ketotifen (ZADITOR) 0.025 % ophthalmic solution 1 drop  1 drop Both Eyes BID PRN Domenic Moras, PA-C      . loperamide (IMODIUM) capsule 2-4 mg  2-4 mg Oral PRN Domenic Moras, PA-C      . methocarbamol (ROBAXIN) tablet 500 mg  500 mg Oral Q8H PRN Domenic Moras, PA-C      . nicotine (NICODERM CQ - dosed in mg/24 hours) patch 21 mg  21 mg Transdermal Daily Domenic Moras, PA-C   21 mg at 10/21/13 1013  . ondansetron (ZOFRAN) tablet 4 mg  4 mg Oral Q8H PRN Domenic Moras, PA-C      . ondansetron (ZOFRAN-ODT) disintegrating tablet 4 mg  4 mg Oral Q6H PRN Domenic Moras, PA-C   4 mg at 10/20/13 2011  . pantoprazole (PROTONIX) EC tablet 40 mg  40 mg Oral QHS Lurena Nida, NP   40 mg at 10/20/13 2242  . traZODone (DESYREL) tablet 200 mg  200 mg Oral QHS Lurena Nida, NP   200 mg at 10/20/13 2217   Current Outpatient Prescriptions  Medication Sig Dispense Refill  . acetaminophen (TYLENOL) 500 MG tablet Take 1,000 mg by mouth every 6 (six) hours as needed for moderate pain.       . Acetaminophen-Caff-Pyrilamine (MIDOL COMPLETE) 500-60-15 MG TABS Take 2 tablets by mouth every 6 (six) hours as needed (pain).      Marland Kitchen  albuterol (PROVENTIL HFA;VENTOLIN HFA) 108 (90 BASE) MCG/ACT inhaler Inhale 2 puffs into the lungs every 6  (six) hours as needed for wheezing or shortness of breath.  1 Inhaler  0  . ALPRAZolam (XANAX) 0.5 MG tablet Take 0.5 mg by mouth 3 (three) times daily as needed for anxiety.      Marland Kitchen aspirin-acetaminophen-caffeine (EXCEDRIN MIGRAINE) 250-250-65 MG per tablet Take 2 tablets by mouth every 6 (six) hours as needed for headache.      Marland Kitchen atenolol (TENORMIN) 50 MG tablet Take 1 tablet (50 mg total) by mouth every evening.  30 tablet  3  . buPROPion (WELLBUTRIN XL) 300 MG 24 hr tablet Take 300 mg by mouth daily.      . citalopram (CELEXA) 20 MG tablet Take 20 mg by mouth daily.      . Cyanocobalamin (VITAMIN B-12 CR PO) Take 1 tablet by mouth daily.      . fluticasone (FLONASE) 50 MCG/ACT nasal spray Place 1 spray into both nostrils daily as needed for allergies or rhinitis.      Marland Kitchen gabapentin (NEURONTIN) 300 MG capsule Take 300 mg by mouth at bedtime.      . hyoscyamine (LEVBID) 0.375 MG 12 hr tablet Take 0.375 mg by mouth every 12 (twelve) hours as needed for cramping.      Marland Kitchen ibuprofen (ADVIL,MOTRIN) 200 MG tablet Take 600-800 mg by mouth every 6 (six) hours as needed for pain.       Marland Kitchen ketotifen (ALLERGY EYE DROPS) 0.025 % ophthalmic solution Place 1 drop into both eyes 2 (two) times daily as needed (itchy eyes).      . Magnesium 500 MG TABS Take 500 mg by mouth daily.      . Nutritional Supplements (ESTROVEN MAXIMUM STRENGTH PO) Take 1 tablet by mouth daily.      Marland Kitchen omeprazole (PRILOSEC OTC) 20 MG tablet Take 20 mg by mouth at bedtime.       Marland Kitchen oxycodone (ROXICODONE) 30 MG immediate release tablet Take 7.5 mg by mouth every 4 (four) hours as needed for pain.      Marland Kitchen oxymetazoline (AFRIN) 0.05 % nasal spray Place 1 spray into both nostrils 2 (two) times daily as needed for congestion.      . pseudoephedrine (SUDAFED) 30 MG tablet Take 30 mg by mouth every 4 (four) hours as needed for congestion (congestion).      . Pyridoxine HCl (VITAMIN B-6 PO) Take 1 tablet by mouth daily.      . sodium phosphate (FLEET)  enema Place 1 enema rectally as needed (bowel movement.). follow package directions      . traZODone (DESYREL) 100 MG tablet Take 200 mg by mouth at bedtime.         Psychiatric Specialty Exam:     Blood pressure 134/73, pulse 69, temperature 97.8 F (36.6 C), temperature source Oral, resp. rate 14, last menstrual period 10/14/2013, SpO2 97.00%.There is no weight on file to calculate BMI.  General Appearance: Casual  Eye Contact::  Fair  Speech:  Slow  Volume:  Decreased  Mood:  Dysphoric and Hopeless  Affect:  Constricted  Thought Process:  Linear  Orientation:  Full (Time, Place, and Person)  Thought Content:  Rumination  Suicidal Thoughts:  Yes.  without intent/plan  Homicidal Thoughts:  No  Memory:  Recent;   Fair  Judgement:  Poor  Insight:  Shallow  Psychomotor Activity:  Decreased  Concentration:  Fair  Recall:  [employer] of Knowledge:Fair  Language: Fair  Akathisia:  Negative  Handed:  Right  AIMS (if indicated):     Assets:  Leisure Time  Sleep:      Musculoskeletal: Strength & Muscle Tone: within normal limits Gait & Station: normal Patient leans: N/A  Treatment Plan Summary: Daily contact with patient to assess and evaluate symptoms and progress in treatment Medication management Admit to Inpatient unit for stabilization. Patient remains depressed and hopless.   Merian Capron MD 10/21/2013 11:32 AM

## 2013-10-21 NOTE — Tx Team (Signed)
Initial Interdisciplinary Treatment Plan  PATIENT STRENGTHS: (choose at least two) Ability for insight Average or above average intelligence Capable of independent living Communication skills General fund of knowledge Motivation for treatment/growth Supportive family/friends  PATIENT STRESSORS: Loss of 4 family dogs Medication change or noncompliance Substance abuse   PROBLEM LIST: Problem List/Patient Goals Date to be addressed Date deferred Reason deferred Estimated date of resolution  Substance Abuse/Dependence- opiates/xanax      Anxiety      Ineffective Coping      Chronic Pain                                     DISCHARGE CRITERIA:  Ability to meet basic life and health needs Adequate post-discharge living arrangements Improved stabilization in mood, thinking, and/or behavior Medical problems require only outpatient monitoring Motivation to continue treatment in a less acute level of care Need for constant or close observation no longer present Reduction of life-threatening or endangering symptoms to within safe limits Safe-care adequate arrangements made Verbal commitment to aftercare and medication compliance Withdrawal symptoms are absent or subacute and managed without 24-hour nursing intervention  PRELIMINARY DISCHARGE PLAN: Attend aftercare/continuing care group Attend 12-step recovery group Outpatient therapy Return to previous living arrangement  PATIENT/FAMIILY INVOLVEMENT: This treatment plan has been presented to and reviewed with the patient, MELLONIE GUESS.  The patient and family have been given the opportunity to ask questions and make suggestions.  Wynn Banker 10/21/2013, 7:10 PM

## 2013-10-21 NOTE — Progress Notes (Signed)
Patient ID: Adriana Hall, female   DOB: 28-Apr-1971, 43 y.o.   MRN: [id number]  Pt states that she had 4 dogs as pets and they are got sick last week and died, pt was trying to care for them and was unable to nurse them back to health, pt states that she has been taking Xanax x1 year for anxiety but has been taking more than normal recently, states that she was taking 0.25 mg every 2 hours and it was still not helping her anxiety, pt states that she has also become dependent on Oxycontin for chronic back and hip pain from falling off porch x1 year ago, states that she got the Oxycontin from a friend and has recently realized that if she doesn't take it that she experiences withdrawal s/s, stated that "I knew I had a problem and needed to come get help, I want to get off these medications and live a normal good life", pt explains that she was on Cymbalta for about 5 years and that it helped her anxiety very well but she moved to [place] 3 years ago and when she went to her first appointment at [facility] they discontinued her off the Cymbalta "cold [place]" and "I haven't felt right since", pt admits that she has recently been "changing around" her psych meds without MD supervision, states that she had an old prescription for Celexa and Wellbutrin that she started taking again, "I feel like I have just really screwed myself up"Pt has supportive husband/family, denies SI/HI/AVH at this time, contracts for safety, c/o eye infection in rt eye that she has had for 4 months, bronchitis since Thanksgiving and states the "last Pap smear I had showed cancer but I haven't been back since then again", pt states that she has had decreased appetite and has lost unknown amount of weight d/t feeling nauseated from "my nerves", oriented pt to unit and rules, pt pleasant and cooperative throughout the admission process, dinner meal given.

## 2013-10-21 NOTE — BH Assessment (Signed)
[facility] Assessment Progress Note   Pt was accepted to [facility] room 300-1.  Support paperwork complete. ED staff notified.

## 2013-10-22 DIAGNOSIS — F1120 Opioid dependence, uncomplicated: Secondary | ICD-10-CM | POA: Diagnosis present

## 2013-10-22 DIAGNOSIS — F1320 Sedative, hypnotic or anxiolytic dependence, uncomplicated: Principal | ICD-10-CM

## 2013-10-22 DIAGNOSIS — F329 Major depressive disorder, single episode, unspecified: Secondary | ICD-10-CM

## 2013-10-22 DIAGNOSIS — F339 Major depressive disorder, recurrent, unspecified: Secondary | ICD-10-CM | POA: Diagnosis present

## 2013-10-22 DIAGNOSIS — F1220 Cannabis dependence, uncomplicated: Secondary | ICD-10-CM

## 2013-10-22 MED ORDER — DULOXETINE HCL 30 MG PO CPEP
30.0000 mg | ORAL_CAPSULE | Freq: Every day | ORAL | Status: DC
  Administered 2013-10-23: 30 mg via ORAL
  Filled 2013-10-22 (×3): qty 1

## 2013-10-22 MED ORDER — FLEET ENEMA 7-19 GM/118ML RE ENEM
1.0000 | ENEMA | Freq: Once | RECTAL | Status: AC
  Administered 2013-10-22: 1 via RECTAL
  Filled 2013-10-22: qty 1

## 2013-10-22 MED ORDER — NICOTINE 21 MG/24HR TD PT24
21.0000 mg | MEDICATED_PATCH | Freq: Every day | TRANSDERMAL | Status: DC
  Administered 2013-10-22: 21 mg via TRANSDERMAL
  Filled 2013-10-22 (×2): qty 1

## 2013-10-22 NOTE — Progress Notes (Signed)
The focus of this group is to educate the patient on the purpose and policies of crisis stabilization and provide a format to answer questions about their admission.  The group details unit policies and expectations of patients while admitted. Patient did not attend this group.

## 2013-10-22 NOTE — Progress Notes (Signed)
Patient ID: Adriana Hall, female   DOB: May 10, 1971, 43 y.o.   MRN: [id number]  D: Writer introduced herself to Careers information officer. Pt informed the writer that she was "anxious and did not know the routine". Writer explained the "routine" and encouraged the writer to ask questions.  Writer explained pt's meds and protocols.  A:  Support and encouragement was offered. 15 min checks continued for safety.  R: Pt remains safe.

## 2013-10-22 NOTE — [facility] Suicide Risk Assessment (Signed)
[facility] INPATIENT:  Family/Significant Other Suicide Prevention Education  Suicide Prevention Education:  Contact Attempts: Debria Garret (pt's husband) [phone]  has been identified by the patient as the family member/significant other with whom the patient will be residing, and identified as the person(s) who will aid the patient in the event of a mental health crisis.  With written consent from the patient, two attempts were made to provide suicide prevention education, prior to and/or following the patient's discharge.  We were unsuccessful in providing suicide prevention education.  A suicide education pamphlet was given to the patient to share with family/significant other.  Date and time of first attempt: 10/22/13 3:45PM Date and time of second attempt: 10/23/13 3:00PM  [place]  10/22/2013, 3:46 PM

## 2013-10-22 NOTE — H&P (Signed)
Psychiatric Admission Assessment Adult  Patient Identification:  Adriana Hall Date of Evaluation:  10/22/2013  Chief Complaint:  BIPOLAR BENZO OPIADPBD  History of Present Illness: This is a 43 year old Caucasian female. She reports, "My husband took me to the hospital on Sunday afternoon. We were on Vacation, but I did not have any pills to take. I woke up one day and could not function without the pills. I was taken Oxycodone tablets for my back, stomach and migraine headache pains. They were not prescribed for me. I was getting them from a friend. While on this vacation without my pills, I started shaking a lot. I know there is no way I could go through the day without taking these medicines. I told my husband that I'm addicted to these pills and that I need help. I'm here to seek help for my addiction. Not looking for a long term rehab place. I will go home after discharge and join AA/NA meetings. I was diagnosed with bipolar disorder, anxiety disorder and depression. I had been on Wellbutrin, Citalopram and Cymbalta. I would like to go back to Cymbalta".  Elements:  Location:  [facility]. Quality:  Cravings, Shakes, restless legs, abdominal cramps. Severity:  Severe, I find myself not functioning without opiates/Xanax pills. Timing:  "I started taking pills at the age of 42". Duration:  Chronic. Context:  "I have IBS, Back/stomach hurt all the time, started taking pills and unable to stop".  Associated Signs/Synptoms:  Depression Symptoms:  depressed mood, feelings of worthlessness/guilt, insomnia,  (Hypo) Manic Symptoms:  Impulsivity,  Anxiety Symptoms:  Excessive Worry,  Psychotic Symptoms:  Hallucinations: Denies  PTSD Symptoms: Had a traumatic exposure:  Denies  Total Time spent with patient: 1 hour  Psychiatric Specialty Exam: Physical Exam  Constitutional: She is oriented to person, place, and time. She appears well-developed.  HENT:  Head: Normocephalic.   Eyes: Pupils are equal, round, and reactive to light.  Neck: Normal range of motion.  Cardiovascular: Normal rate.   Respiratory: Effort normal.  GI: Soft.  Genitourinary:  Denies any issues in this areas   Musculoskeletal: Normal range of motion.  Neurological: She is alert and oriented to person, place, and time.  Skin: Skin is warm.  Psychiatric: Her speech is normal and behavior is normal. Thought content normal. Her mood appears anxious. Her affect is not angry, not blunt, not labile and not inappropriate. Cognition and memory are normal. She expresses impulsivity. She exhibits a depressed mood.    Review of Systems  Constitutional: Positive for chills and malaise/fatigue.  Eyes: Negative.   Respiratory: Negative.   Cardiovascular: Negative.   Gastrointestinal: Positive for nausea and abdominal pain.  Genitourinary: Negative.   Musculoskeletal: Positive for myalgias.       Restless leg  Skin: Negative.   Neurological: Positive for dizziness, tremors, weakness and headaches.  Endo/Heme/Allergies: Negative.   Psychiatric/Behavioral: Positive for depression and substance abuse (Benzodiazepine, oioid dependence). Negative for suicidal ideas, hallucinations and memory loss. The patient is nervous/anxious and has insomnia.     Blood pressure 112/71, pulse 83, temperature 97.8 F (36.6 C), temperature source Oral, resp. rate 20, height 5' 5"  (1.651 m), weight 69.4 kg (153 lb), last menstrual period 10/14/2013.Body mass index is 25.46 kg/(m^2).  General Appearance: Casual and Fairly Groomed  Eye Contact::  Good  Speech:  Clear and Coherent  Volume:  Normal  Mood:  "Some depression/anxiety in me still, but hopeful"  Affect:  Congruent  Thought Process:  Coherent and Goal Directed  Orientation:  Full (Time, Place, and Person)  Thought Content:  Denies any psychotic symptoms  Suicidal Thoughts:  No  Homicidal Thoughts:  No  Memory:  Immediate;   Good Recent;   Good Remote;    Good  Judgement:  Fair  Insight:  Fair  Psychomotor Activity:  Tremor and Restless legs  Concentration:  Fair  Recall:  Good  Fund of Knowledge:Fair  Language: Fair  Akathisia:  No  Handed:  Right  AIMS (if indicated):     Assets:  Communication Skills Desire for Improvement Financial Resources/Insurance  Sleep:  Number of Hours: 6    Musculoskeletal: Strength & Muscle Tone: within normal limits Gait & Station: normal Patient leans: N/A  Past Psychiatric History: Diagnosis:  Benzodiazepine dependence, Cannabis dependence, major depressive disorder  Hospitalizations: [facility]  Outpatient Care: None reported  Substance Abuse Care: None reported  Self-Mutilation:  Denies  Suicidal Attempts: Denies attempts and or thoughts.  Violent Behaviors: NA   Past Medical History:   Past Medical History  Diagnosis Date  . Anxiety     10 years  . Arrhythmia     5 years  . Headache(784.0)     Chronic for 30 yrs  . Depression     10 years  . Hypertension   . Colitis 03-01-2011    Colonoscopy  . Hemorrhoids 03-01-2011    Colonoscopy   . Bipolar 1 disorder, depressed   . Bronchitis    Cardiac History:  Arrthymias, HTN  Allergies:   Allergies  Allergen Reactions  . Doxycycline Nausea And Vomiting   PTA Medications: Prescriptions prior to admission  Medication Sig Dispense Refill  . acetaminophen (TYLENOL) 500 MG tablet Take 1,000 mg by mouth every 6 (six) hours as needed for moderate pain.       . Acetaminophen-Caff-Pyrilamine (MIDOL COMPLETE) 500-60-15 MG TABS Take 2 tablets by mouth every 6 (six) hours as needed (pain).      Marland Kitchen albuterol (PROVENTIL HFA;VENTOLIN HFA) 108 (90 BASE) MCG/ACT inhaler Inhale 2 puffs into the lungs every 6 (six) hours as needed for wheezing or shortness of breath.  1 Inhaler  0  . ALPRAZolam (XANAX) 0.5 MG tablet Take 0.5 mg by mouth 3 (three) times daily as needed for anxiety.      Marland Kitchen aspirin-acetaminophen-caffeine (EXCEDRIN MIGRAINE)  250-250-65 MG per tablet Take 2 tablets by mouth every 6 (six) hours as needed for headache.      Marland Kitchen atenolol (TENORMIN) 50 MG tablet Take 1 tablet (50 mg total) by mouth every evening.  30 tablet  3  . buPROPion (WELLBUTRIN XL) 300 MG 24 hr tablet Take 300 mg by mouth daily.      . citalopram (CELEXA) 20 MG tablet Take 20 mg by mouth daily.      . Cyanocobalamin (VITAMIN B-12 CR PO) Take 1 tablet by mouth daily.      . fluticasone (FLONASE) 50 MCG/ACT nasal spray Place 1 spray into both nostrils daily as needed for allergies or rhinitis.      Marland Kitchen gabapentin (NEURONTIN) 300 MG capsule Take 300 mg by mouth at bedtime.      . hyoscyamine (LEVBID) 0.375 MG 12 hr tablet Take 0.375 mg by mouth every 12 (twelve) hours as needed for cramping.      Marland Kitchen ibuprofen (ADVIL,MOTRIN) 200 MG tablet Take 600-800 mg by mouth every 6 (six) hours as needed for pain.       Marland Kitchen ketotifen (ALLERGY EYE DROPS)  0.025 % ophthalmic solution Place 1 drop into both eyes 2 (two) times daily as needed (itchy eyes).      . Magnesium 500 MG TABS Take 500 mg by mouth at bedtime.       . Nutritional Supplements (ESTROVEN MAXIMUM STRENGTH PO) Take 1 tablet by mouth daily.      Marland Kitchen omeprazole (PRILOSEC OTC) 20 MG tablet Take 40 mg by mouth at bedtime.       Marland Kitchen oxycodone (ROXICODONE) 30 MG immediate release tablet Take 7.5 mg by mouth every 4 (four) hours as needed for pain.      Marland Kitchen oxymetazoline (AFRIN) 0.05 % nasal spray Place 1 spray into both nostrils 2 (two) times daily as needed for congestion.      . pseudoephedrine (SUDAFED) 30 MG tablet Take 30 mg by mouth every 4 (four) hours as needed for congestion (congestion).      . Pyridoxine HCl (VITAMIN B-6 PO) Take 1 tablet by mouth daily.      . sodium phosphate (FLEET) enema Place 1 enema rectally as needed (bowel movement.). follow package directions      . traZODone (DESYREL) 100 MG tablet Take 200 mg by mouth at bedtime.        Previous Psychotropic Medications: Medication/Dose  See  medication lists               Substance Abuse History in the last 12 months:  yes  Consequences of Substance Abuse: Medical Consequences:  Liver damage, Possible death by overdose Legal Consequences:  Arrests, jail time, Loss of driving privilege. Family Consequences:  Family discord, divorce and or separation.  Social History:  reports that she has been smoking Cigarettes.  She has a 39 pack-year smoking history. She has never used smokeless tobacco. She reports that she drinks alcohol. She reports that she uses illicit drugs (Benzodiazepines, Other-see comments, and Marijuana).  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  Legal History: Denies any pending legal charges  Hobbies/Interests: NA  Family History:   Family History  Problem Relation Age of Onset  . Pancreatic cancer Maternal Grandfather   . Liver cancer Maternal Grandfather   . Colon cancer Neg Hx   . Clotting disorder Mother   . Diabetes Paternal Grandmother     And PGF  . Heart disease Mother   . Other Father     Brain tumor    Results for orders placed during the hospital encounter of 10/20/13 (from the past 72 hour(s))  CBC     Status: Abnormal   Collection Time    10/20/13  5:53 PM      Result Value Ref Range   WBC 14.7 (*) 4.0 - 10.5 K/uL   RBC 4.95  3.87 - 5.11 MIL/uL   Hemoglobin 16.1 (*) 12.0 - 15.0 g/dL   HCT 45.5  36.0 - 46.0 %   MCV 91.9  78.0 - 100.0 fL   MCH 32.5  26.0 - 34.0 pg   MCHC 35.4  30.0 - 36.0 g/dL   RDW 14.2  11.5 - 15.5 %   Platelets 291   150 - 400  K/uL  COMPREHENSIVE METABOLIC PANEL     Status: Abnormal   Collection Time    10/20/13  5:53 PM      Result Value Ref Range   Sodium 138  137 - 147 mEq/L   Potassium 3.7  3.7 - 5.3 mEq/L   Chloride 100  96 - 112 mEq/L   CO2 25  19 - 32 mEq/L   Glucose, Bld 133 (*) 70 - 99 mg/dL   BUN 7  6 - 23 mg/dL   Creatinine, Ser 0.77  0.50 - 1.10 mg/dL   Calcium 9.5  8.4 - 10.5 mg/dL   Total Protein 7.2  6.0 - 8.3 g/dL   Albumin 3.7  3.5 - 5.2 g/dL   AST 15  0 - 37 U/L   ALT 11  0 - 35 U/L   Alkaline Phosphatase 55  39 - 117 U/L   Total Bilirubin <0.2 (*) 0.3 - 1.2 mg/dL   GFR calc non Af Amer >90  >90 mL/min   GFR calc Af Amer >90  >90 mL/min   Comment: (NOTE)     The eGFR has been calculated using the CKD EPI equation.     This calculation has not been validated in all clinical situations.     eGFR's persistently <90 mL/min signify possible Chronic Kidney     Disease.  ETHANOL     Status: None   Collection Time    10/20/13  5:53 PM      Result Value Ref Range   Alcohol, Ethyl (B) <11  0 - 11 mg/dL   Comment:            LOWEST DETECTABLE LIMIT FOR     SERUM ALCOHOL IS 11 mg/dL     FOR MEDICAL PURPOSES ONLY  ACETAMINOPHEN LEVEL     Status: None   Collection Time    10/20/13  6:20 PM      Result Value Ref Range   Acetaminophen (Tylenol), Serum <15.0  10 - 30 ug/mL   Comment:            THERAPEUTIC CONCENTRATIONS VARY     SIGNIFICANTLY. A RANGE OF 10-30     ug/mL MAY BE AN EFFECTIVE     CONCENTRATION FOR MANY PATIENTS.     HOWEVER, SOME ARE BEST TREATED     AT CONCENTRATIONS OUTSIDE THIS     RANGE.     ACETAMINOPHEN CONCENTRATIONS     >150 ug/mL AT 4 HOURS AFTER     INGESTION AND >50 ug/mL AT 12     HOURS AFTER INGESTION ARE     OFTEN ASSOCIATED WITH TOXIC     REACTIONS.  SALICYLATE LEVEL     Status: Abnormal   Collection Time    10/20/13  6:20 PM      Result Value Ref Range   Salicylate Lvl 2.5 (*) 2.8 - 20.0 mg/dL  URINE RAPID DRUG SCREEN (HOSP PERFORMED)      Status: Abnormal   Collection Time    10/20/13  6:59 PM      Result Value Ref Range   Opiates NONE DETECTED  NONE DETECTED   Cocaine NONE DETECTED  NONE DETECTED   Benzodiazepines POSITIVE (*) NONE DETECTED   Amphetamines NONE DETECTED  NONE DETECTED   Tetrahydrocannabinol POSITIVE (*) NONE DETECTED   Barbiturates NONE DETECTED  NONE DETECTED   Comment:            DRUG SCREEN FOR MEDICAL PURPOSES  ONLY.  IF CONFIRMATION IS NEEDED     FOR ANY PURPOSE, NOTIFY LAB     WITHIN 5 DAYS.                LOWEST DETECTABLE LIMITS     FOR URINE DRUG SCREEN     Drug Class       Cutoff (ng/mL)     Amphetamine      1000     Barbiturate      200     Benzodiazepine   992     Tricyclics       426     Opiates          300     Cocaine          300     THC              50   Psychological Evaluations:  Assessment:   DSM5: Schizophrenia Disorders:  NA Obsessive-Compulsive Disorders:  NA Trauma-Stressor Disorders:  NA Substance/Addictive Disorders:  Cannabis Use Disorder - Severe (304.30), Benzodiazepine dependence Depressive Disorders:  Major Depressive Disorder - Moderate (296.22)  AXIS I:  Benzodiazepine dependence, Cannabis dependence, major depressive disorder AXIS II:  Deferred AXIS III:   Past Medical History  Diagnosis Date  . Anxiety     10 years  . Arrhythmia     5 years  . Headache(784.0)     Chronic for 30 yrs  . Depression     10 years  . Hypertension   . Colitis 03-01-2011    Colonoscopy  . Hemorrhoids 03-01-2011    Colonoscopy   . Bipolar 1 disorder, depressed   . Bronchitis    AXIS IV:  Polysubstance dependence AXIS V:  41-50 serious symptoms  Treatment Plan/Recommendations: 1. Admit for crisis management and stabilization, estimated length of stay 3-5 days.  2. Medication management to reduce current symptoms to base line and improve the patient's overall level of functioning; Continue Librium/Clonidine detox regimen  3. Treat health problems as indicated.   4. Develop treatment plan to decrease risk of relapse upon discharge and the need for readmission.  5. Psycho-social education regarding relapse prevention and self care.  6. Health care follow up as needed for medical problems.  7. Review, reconcile, and reinstate any pertinent home medications for other health issues where appropriate. 8. Call for consults with hospitalist for any additional specialty patient care services as needed.  Treatment Plan Summary: Daily contact with patient to assess and evaluate symptoms and progress in treatment Medication management  Current Medications:  Current Facility-Administered Medications  Medication Dose Route Frequency Provider Last Rate Last Dose  . acetaminophen (TYLENOL) tablet 650 mg  650 mg Oral Q6H PRN Waylan Boga, NP      . albuterol (PROVENTIL HFA;VENTOLIN HFA) 108 (90 BASE) MCG/ACT inhaler 2 puff  2 puff Inhalation Q6H PRN Waylan Boga, NP      . alum & mag hydroxide-simeth (MAALOX/MYLANTA) 200-200-20 MG/5ML suspension 30 mL  30 mL Oral Q4H PRN Waylan Boga, NP      . atenolol (TENORMIN) tablet 50 mg  50 mg Oral QPM Waylan Boga, NP      . buPROPion (WELLBUTRIN XL) 24 hr tablet 300 mg  300 mg Oral Daily Waylan Boga, NP   300 mg at 10/22/13 0830  . chlordiazePOXIDE (LIBRIUM) capsule 25 mg  25 mg Oral Q6H PRN Waylan Boga, NP      . chlordiazePOXIDE (LIBRIUM) capsule 25 mg  25 mg Oral  QID Waylan Boga, NP   25 mg at 10/22/13 3710   Followed by  . [START ON 10/23/2013] chlordiazePOXIDE (LIBRIUM) capsule 25 mg  25 mg Oral TID Waylan Boga, NP       Followed by  . [START ON 10/24/2013] chlordiazePOXIDE (LIBRIUM) capsule 25 mg  25 mg Oral BH-qamhs Waylan Boga, NP       Followed by  . [START ON 10/25/2013] chlordiazePOXIDE (LIBRIUM) capsule 25 mg  25 mg Oral Daily Waylan Boga, NP      . citalopram (CELEXA) tablet 20 mg  20 mg Oral Daily Waylan Boga, NP   20 mg at 10/22/13 0830  . cloNIDine (CATAPRES) tablet 0.1 mg  0.1 mg Oral QID Waylan Boga, NP   0.1 mg at 10/22/13 0824   Followed by  . [START ON 10/24/2013] cloNIDine (CATAPRES) tablet 0.1 mg  0.1 mg Oral BH-qamhs Waylan Boga, NP       Followed by  . [START ON 10/27/2013] cloNIDine (CATAPRES) tablet 0.1 mg  0.1 mg Oral QAC breakfast Waylan Boga, NP      . dicyclomine (BENTYL) tablet 20 mg  20 mg Oral Q6H PRN Waylan Boga, NP   20 mg at 10/22/13 0823  . fluticasone (FLONASE) 50 MCG/ACT nasal spray 1 spray  1 spray Each Nare Daily PRN Waylan Boga, NP      . gabapentin (NEURONTIN) capsule 300 mg  300 mg Oral QHS Waylan Boga, NP   300 mg at 10/21/13 2127  . hydrOXYzine (ATARAX/VISTARIL) tablet 25 mg  25 mg Oral Q6H PRN Waylan Boga, NP      . hyoscyamine (LEVBID) 0.375 MG 12 hr tablet 0.375 mg  0.375 mg Oral Q12H PRN Waylan Boga, NP      . ibuprofen (ADVIL,MOTRIN) tablet 600 mg  600 mg Oral Q8H PRN Waylan Boga, NP   600 mg at 10/22/13 0823  . ketotifen (ZADITOR) 0.025 % ophthalmic solution 1 drop  1 drop Both Eyes BID PRN Waylan Boga, NP      . loperamide (IMODIUM) capsule 2-4 mg  2-4 mg Oral PRN Waylan Boga, NP      . magnesium hydroxide (MILK OF MAGNESIA) suspension 30 mL  30 mL Oral Daily PRN Waylan Boga, NP      . methocarbamol (ROBAXIN) tablet 500 mg  500 mg Oral Q8H PRN Waylan Boga, NP      . multivitamin with minerals tablet 1 tablet  1 tablet Oral Daily Waylan Boga, NP   1 tablet at 10/22/13 0824  . nicotine (NICODERM CQ - dosed in mg/24 hours) patch 21 mg  21 mg Transdermal Daily Waylan Boga, NP      . nicotine (NICODERM CQ - dosed in mg/24 hours) patch 21 mg  21 mg Transdermal Q0600 Nicholaus Bloom, MD   21 mg at 10/22/13 6269  . ondansetron (ZOFRAN-ODT) disintegrating tablet 4 mg  4 mg Oral Q6H PRN Waylan Boga, NP      . pantoprazole (PROTONIX) EC tablet 40 mg  40 mg Oral QHS Waylan Boga, NP   40 mg at 10/21/13 2127  . pneumococcal 23 valent vaccine (PNU-IMMUNE) injection 0.5 mL  0.5 mL Intramuscular Tomorrow-1000 Nicholaus Bloom, MD      . thiamine (VITAMIN  B-1) tablet 100 mg  100 mg Oral Daily Waylan Boga, NP   100 mg at 10/22/13 0824  . traZODone (DESYREL) tablet 200 mg  200 mg Oral QHS Waylan Boga, NP   200 mg at 10/21/13 2127  Observation Level/Precautions:  15 minute checks  Laboratory:  Per ED, UDS (+) for Benzodiazepine, THC  Psychotherapy:  Group sessions, AA/NA meetings  Medications: Librium detox protocols in progress   Consultations:  As needed  Discharge Concerns: Sobriety   Estimated LOS: 2-4 days  Other:     I certify that inpatient services furnished can reasonably be expected to improve the patient's condition.   Encarnacion Slates, PMHNP-BC 5/19/20158:59 AM Personally evaluated the patient, reviewed the physical exam and labs, formulated the assessment and treatment plan Geralyn Flash A. Sabra Heck, M.D.

## 2013-10-22 NOTE — BHH Counselor (Signed)
Adult Comprehensive Assessment  Patient ID: Adriana Hall, female   DOB: [birth year], 43 y.o.   MRN: [id number]  Information Source: Information source: Patient  Current Stressors:  Physical health (include injuries & life threatening diseases): IBS-C; woferman's-heart problems. Dr. Cleda Mccreedy is my heart doctor.  Bereavement / Loss: 4 dogs recently died due to distemper. I took it really hard.   Living/Environment/Situation:  Living Arrangements: Spouse/significant other;Children Living conditions (as described by patient or guardian): I live in a house with my husband and 58 year old daughter.  How long has patient lived in current situation?: 3 years. I love it there.  What is atmosphere in current home: Comfortable;Loving  Family History:  Marital status: Married Number of Years Married: 5 What types of issues is patient dealing with in the relationship?: We get along wonderfully. He's a great man and great to my daughter.  Additional relationship information: n/a  Does patient have children?: Yes How many children?: 3 How is patient's relationship with their children?: 64 year old daughter, 30 year old son, and 59 year old son. We get along very well. We are really close knit.   Childhood History:  By whom was/is the patient raised?: Both parents Additional childhood history information: My childhood was great. My family is very close. It was wonderful. parents married Description of patient's relationship with caregiver when they were a child: close to both parents. loving family Patient's description of current relationship with people who raised him/her: close to both parents equally I think. I love them both and am very open with them.  Does patient have siblings?: Yes Number of Siblings: 1 Description of patient's current relationship with siblings: one younger brother. Close to him. He lives nearby.  Did patient suffer any verbal/emotional/physical/sexual abuse as a child?:  No Did patient suffer from severe childhood neglect?: No Has patient ever been sexually abused/assaulted/raped as an adolescent or adult?: No Was the patient ever a victim of a crime or a disaster?: No Witnessed domestic violence?: No Has patient been effected by domestic violence as an adult?: No  Education:  Highest grade of school patient has completed: Enrolled in college; Had to drop out due to IBS. I'm hoping to go back when I get my head straight.  Currently a student?: No Learning disability?: No  Employment/Work Situation:   Employment situation: Unemployed (five years. I can't really work due to medical problems.) Patient's job has been impacted by current illness: No (by medical issues only. ) What is the longest time patient has a held a job?: 5-6 years Where was the patient employed at that time?: [employer] in [place].  Has patient ever been in the [employer]?: No Has patient ever served in combat?: No  Financial Resources:   Museum/gallery curator resources: Multimedia programmer;Support from parents / caregiver Does patient have a representative payee or guardian?: No  Alcohol/Substance Abuse:   What has been your use of drugs/alcohol within the last 12 months?: xanax 1.5mg  daily on a bad day (2-3 pills)/mixing celexa, wellbutrin meds. No MD/psychiatrist to assist with medication management. Pt self medicating "i really messed myself up." I take oxycodone 30mg  (not prescribed) for pain problems. Last three weeks, tried to ween self off and experienced withdrawals. no alcohol use/. I smoked weed the last few days to assist with withdrawals.  If attempted suicide, did drugs/alcohol play a role in this?: No (I've had thoughts about not existing but would never kill myself. I've never realy had thoughts of hurting myself. )  Alcohol/Substance Abuse Treatment Hx: Past Tx, Inpatient;Past Tx, Outpatient If yes, describe treatment: at 45, I went to [facility] for observation after breaking up with  a boyfriend. No SA treatment. I used to have a psychiatrist/therapist at [facility] years ago but now I have insurance.  Has alcohol/substance abuse ever caused legal problems?: No  Social Support System:   Patient's Community Support System: Good Describe Community Support System: I have great friends in the community and a really supportive family Type of faith/religion: baptist How does patient's faith help to cope with current illness?: I talk to god about my problems all the time. It helps me alot.   Leisure/Recreation:   Leisure and Hobbies: gardening; reading books; crafts. walking bicycling.   Strengths/Needs:   What things does the patient do well?: I'm a nice person; artistic; good wife and mother In what areas does patient struggle / problems for patient: depression; medication management-I'm struggling right now because my meds are not right.   Discharge Plan:   Does patient have access to transportation?: Yes (car and license) Will patient be returning to same living situation after discharge?: Yes Currently receiving community mental health services: No If no, would patient like referral for services when discharged?: Yes (What county?) [facility]) Does patient have financial barriers related to discharge medications?: No  Summary/Recommendations:    Pt is 43 year old female living in [place], [place] ([place]) with her daughter and husband. Pt presents to [facility] for Benzo detox/pain pill abuse, depression, and medication stabilization. Pt denies SI/HI/AVH. Recommendations for pt include: crisis stabilization, therapeutic milieu, encourage group attendance and participation, librium/clonidine taper for withdrawals, medication management for mood stabilization, and development of comprehensive mental wellness/sobriety plan. Pt plans to return home and follow up at [facility] for SA IOP, therapy, and med management. Pt plans to attend NA meetings near her home.    [place]. LCSWA  10/22/2013

## 2013-10-22 NOTE — Progress Notes (Signed)
Recreation Therapy Notes  Animal-Assisted Activity/Therapy (AAA/T) Program Checklist/Progress Notes Patient Eligibility Criteria Checklist & Daily Group note for Rec Tx Intervention  Date: 05.19.2015 Time: 2:45pm Location: [address]    AAA/T Program Assumption of Risk Form signed by Patient/ or Parent Legal Guardian yes  Patient is free of allergies or sever asthma yes  Patient reports no fear of animals yes  Patient reports no history of cruelty to animals yes   Patient understands his/her participation is voluntary yes  Patient washes hands before animal contact yes  Patient washes hands after animal contact yes  Behavioral Response: DID NOT ATTEND  Lane Hacker, LRT/CTRS  Lane Hacker 10/22/2013 4:49 PM

## 2013-10-22 NOTE — BHH Suicide Risk Assessment (Signed)
Suicide Risk Assessment  Admission Assessment     Nursing information obtained from:  Patient Demographic factors:  Caucasian;Unemployed Current Mental Status:  NA Loss Factors:  Loss of significant relationship (4 dogs died last week) Historical Factors:  NA Risk Reduction Factors:  Positive social support;Living with another person, especially a relative;Positive therapeutic relationship Total Time spent with patient: 30 minutes  CLINICAL FACTORS:   Depression:   Comorbid alcohol abuse/dependence Insomnia Alcohol/Substance Abuse/Dependencies  Psychiatric Specialty Exam:     Blood pressure 100/62, pulse 71, temperature 97.8 F (36.6 C), temperature source Oral, resp. rate 20, height 5\' 5"  (1.651 m), weight 69.4 kg (153 lb), last menstrual period 10/14/2013.Body mass index is 25.46 kg/(m^2).  General Appearance: Fairly Groomed  Engineer, water::  Fair  Speech:  Clear and Coherent  Volume:  Decreased  Mood:  Anxious and Depressed  Affect:  anxious, sad, worried  Thought Process:  Coherent and Goal Directed  Orientation:  Full (Time, Place, and Person)  Thought Content:  events, worries, concerns  Suicidal Thoughts:  No  Homicidal Thoughts:  No  Memory:  Immediate;   Fair Recent;   Fair Remote;   Fair  Judgement:  Fair  Insight:  Present and Shallow  Psychomotor Activity:  Restlessness  Concentration:  Fair  Recall:  [employer] of Knowledge:NA  Language: Fair  Akathisia:  No  Handed:    AIMS (if indicated):     Assets:  Desire for Improvement  Sleep:  Number of Hours: 6   Musculoskeletal: Strength & Muscle Tone: within normal limits Gait & Station: normal Patient leans: N/A  COGNITIVE FEATURES THAT CONTRIBUTE TO RISK:  Closed-mindedness Polarized thinking Thought constriction (tunnel vision)    SUICIDE RISK:   Minimal: No identifiable suicidal ideation.  Patients presenting with no risk factors but with morbid ruminations; may be classified as minimal risk based  on the severity of the depressive symptoms  PLAN OF CARE: Supportive approach/coping skills/relapse prevention                               Detox Librium/clonidine                               Reassess and address the co morbities  I certify that inpatient services furnished can reasonably be expected to improve the patient's condition.  Nicholaus Bloom 10/22/2013, 3:31 PM

## 2013-10-22 NOTE — BHH Group Notes (Signed)
[place] LCSW Group Therapy  10/22/2013 2:57 PM  Type of Therapy:  Group Therapy  Participation Level:  Active  Participation Quality:  Attentive  Affect:  Depressed and Tearful  Cognitive:  Oriented  Insight:  Improving  Engagement in Therapy:  Improving  Modes of Intervention:  Discussion, Education, Exploration, Problem-solving, Rapport Building, Socialization and Support  Summary of Progress/Problems: [facility] Speaker came to talk about his personal journey with substance abuse and addiction. The pt processed ways by which to relate to the speaker. [place] speaker provided handouts and educational information pertaining to groups and services offered by the [facility]. Adriana Hall was attentive and engaged as the speaker shared his personal story. She became tearful when speaker talked about his four dogs. Adriana Hall shared that her four dogs died recently and stated that they meant a lot to her. Adriana Hall expressed interest in [facility] groups/peer support/creative writing classes.    Krupa Stege Smart LCSWA  10/22/2013, 2:57 PM

## 2013-10-22 NOTE — Progress Notes (Signed)
Adult Psychoeducational Group Note  Date:  10/22/2013 Time:  11:42 PM  Group Topic/Focus:  Wrap-Up Group:   The focus of this group is to help patients review their daily goal of treatment and discuss progress on daily workbooks.  Participation Level:  Active  Participation Quality:  Appropriate, Sharing and Supportive  Affect:  Appropriate and Excited  Cognitive:  Alert and Appropriate  Insight: Appropriate  Engagement in Group:  Engaged and Supportive  Modes of Intervention:  Discussion  Additional Comments:  Patient stated that she has had a "pretty good day". Patient feels that she can actually begin to enjoy life now.  Margart Sickles [place]n [place] 10/22/2013, 11:42 PM

## 2013-10-23 DIAGNOSIS — F411 Generalized anxiety disorder: Secondary | ICD-10-CM

## 2013-10-23 DIAGNOSIS — Z634 Disappearance and death of family member: Secondary | ICD-10-CM

## 2013-10-23 MED ORDER — ENSURE COMPLETE PO LIQD
237.0000 mL | Freq: Two times a day (BID) | ORAL | Status: DC
  Administered 2013-10-23: 237 mL via ORAL

## 2013-10-23 MED ORDER — DULOXETINE HCL 60 MG PO CPEP
60.0000 mg | ORAL_CAPSULE | Freq: Every day | ORAL | Status: DC
  Administered 2013-10-24 – 2013-10-25 (×2): 60 mg via ORAL
  Filled 2013-10-23: qty 1
  Filled 2013-10-23: qty 4
  Filled 2013-10-23 (×2): qty 1

## 2013-10-23 NOTE — Progress Notes (Signed)
Attended group 

## 2013-10-23 NOTE — Progress Notes (Signed)
NUTRITION ASSESSMENT  Pt identified as at risk on the Malnutrition Screen Tool  INTERVENTION: 1. Educated patient on the importance of nutrition and encouraged intake of food and beverages. 2. Discussed weight goals. 3. Supplements: MVI and thiamine daily.  Ensure Complete po BID, each supplement provides 350 kcal and 13 grams of protein   NUTRITION DIAGNOSIS: Unintentional weight loss related to sub-optimal intake as evidenced by pt report.   Goal: Pt to meet >/= 90% of their estimated nutrition needs.  Monitor:  PO intake  Assessment:  Patient admitted for detox from opiates.  Hx includes bipolar depression and IBS-C.  States that she has had a poor appetite for the past 3 years and has lost >60 lbs in the past year due to increasing IBS symptoms (e-chart indicates a 17 lb weight loss during that time period.  States that currently she is eating small amounts and complains of constipation.  43 y.o. female  Height: Ht Readings from Last 1 Encounters:  10/21/13 5\' 5"  (1.651 m)    Weight: Wt Readings from Last 1 Encounters:  10/21/13 153 lb (69.4 kg)    Weight Hx: Wt Readings from Last 10 Encounters:  10/21/13 153 lb (69.4 kg)  09/23/13 157 lb (71.215 kg)  09/10/13 163 lb (73.936 kg)  04/13/13 164 lb 3.2 oz (74.481 kg)  08/29/12 170 lb (77.111 kg)  07/30/12 160 lb (72.576 kg)  06/07/12 174 lb 9.6 oz (79.198 kg)  03/09/12 181 lb (82.101 kg)  03/08/12 176 lb 6 oz (80.003 kg)  03/29/11 181 lb (82.101 kg)    BMI:  Body mass index is 25.46 kg/(m^2). Pt meets criteria for overweight based on current BMI.  Estimated Nutritional Needs: Kcal: 25-30 kcal/kg Protein: > 1 gram protein/kg Fluid: 1 ml/kcal  Diet Order: General Pt is also offered choice of unit snacks mid-morning and mid-afternoon.  Pt is eating as desired.   Lab results and medications reviewed.   Antonieta Iba, RD, LDN Clinical Inpatient Dietitian Pager:  [phone] Weekend and after hours pager:   [phone]

## 2013-10-23 NOTE — BHH Group Notes (Signed)
[facility] LCSW Aftercare Discharge Planning Group Note   10/23/2013 9:31 AM  Participation Quality: Appropriate     Mood/Affect:  Appropriate  Depression Rating:  0  Anxiety Rating:  2  Thoughts of Suicide:  No Will you contract for safety?   NA  Current AVH:  No  Plan for Discharge/Comments:  Pt reports poor sleep. She will follow up at [facility] for med management, SA IOP, and therapy assessment. Pt plans to return home at d/c.   Transportation Means: husband   Supports: husband and family support   Civil engineer, contracting

## 2013-10-23 NOTE — Tx Team (Signed)
Interdisciplinary Treatment Plan Update (Adult)  Date: 10/23/2013   Time Reviewed: 10:57 AM  Progress in Treatment:  Attending groups: Yes  Participating in groups:  Yes  Taking medication as prescribed: Yes  Tolerating medication: Yes  Family/Significant othe contact made: Contact attempts with husband. SPE completed with pt.  Patient understands diagnosis: Yes, AEB seeking treatment for benzo/pain pill detox, mood stabilization, and med management.  Discussing patient identified problems/goals with staff: Yes  Medical problems stabilized or resolved: Yes  Denies suicidal/homicidal ideation: Yes during group/self report.  Patient has not harmed self or Others: Yes  New problem(s) identified:  Discharge Plan or Barriers: Pt has followup at [facility] and plans to attend NA groups near her home.  Additional comments: This is a 43 year old Caucasian female. She reports, "My husband took me to the hospital on Sunday afternoon. We were on Vacation, but I did not have any pills to take. I woke up one day and could not function without the pills. I was taken Oxycodone tablets for my back, stomach and migraine headache pains. They were not prescribed for me. I was getting them from a friend. While on this vacation without my pills, I started shaking a lot. I know there is no way I could go through the day without taking these medicines. I told my husband that I'm addicted to these pills and that I need help. I'm here to seek help for my addiction. Not looking for a long term rehab place. I will go home after discharge and join AA/NA meetings. I was diagnosed with bipolar disorder, anxiety disorder and depression. I had been on Wellbutrin, Citalopram and Cymbalta. I would like to go back to Cymbalta". Reason for Continuation of Hospitalization: Librium taper/clonidine taper-withdrawals Mood stabilization Medication management  Estimated length of stay: 1-2 days (likely d/c Friday)  For review of  initial/current patient goals, please see plan of care.  Attendees:  Patient:    Family:    Physician: Carlton Adam MD 10/23/2013 10:51 AM   Nursing: Butch Penny RN  10/23/2013. 10:51 AM   Clinical Social Worker [place], [place]  10/23/2013 10:51 AM   Other: Karsten Fells RN  10/23/2013 10:51 AM   Other: Hardie Pulley. PA 10/23/2013 10:51 AM   Other: Gerline Legacy Nurse CM 10/23/2013 10:51 AM   Other:    Scribe for Treatment Team:  Maxie Better [place] 10/23/2013 10:51 AM

## 2013-10-23 NOTE — Progress Notes (Signed)
Patient ID: Adriana Hall, female   DOB: 1970/06/20, 43 y.o.   MRN: [id number] She has been up and about and to groups interacting with peers and staff. Self inventory: depression 2, hopelessness 1, denies withdrawal symptoms and SI thoughts. She has not c/o stomach pain but did receive a prn for nausea this AM that was effective.

## 2013-10-23 NOTE — Progress Notes (Signed)
Patient ID: Adriana Hall, female   DOB: 1970-07-08, 43 y.o.   MRN: [id number]  D: Writer observer pt's eye and was told by pt that she had a "deep socket infection". Writer informed pt that there were drops available, however writer had to send for drops from [facility]. When asked about her day pt stated that in the past she "has been accused of not showing emotion".  However, today during group the facilitator spoke of animals and it "touched her". Pt stated, "It's been a long time since I cried like that". Stated "she needed to have a good cry".   A:  Support and encouragement was offered. 15 min checks continued for safety.  R: Pt remains safe.

## 2013-10-23 NOTE — Progress Notes (Signed)
[facility] MD Progress Note  10/23/2013 5:25 PM Adriana Hall  MRN:  [id number] Subjective:  Adriana Hall continues to have a hard time. She has identified herself with a speaker who came with the [place]. She can admit she became and addict. She states that after her failed marriage she did not get to trust her second husband going to him and talk about the way she felt etc. State she kept things inside and seemed to be the strong person that other people came too. Admits that when she could not handle things rather than say so, share with her husband she used pills. She states she is ready to change things around. Her husband knows now all that is going on and has offer to be there for her. She has continued to grief the death of her dogs Diagnosis:   DSM5: Schizophrenia Disorders:  none Obsessive-Compulsive Disorders:  none Trauma-Stressor Disorders:  none Substance/Addictive Disorders:  Opioid Disorder - Severe (304.00), Benzodiazepine use disorder Depressive Disorders:  Major Depressive Disorder - Moderate (296.22) Total Time spent with patient: 30 minutes  Axis I: Anxiety Disorder NOS and Bereavement  ADL's:  Intact  Sleep: Fair  Appetite:  Fair  Suicidal Ideation:  Plan:  denies Intent:  denies Means:  denies Homicidal Ideation:  Plan:  denies Intent:  denies Means:  denies AEB (as evidenced by):  Psychiatric Specialty Exam: Physical Exam  Review of Systems  Constitutional: Negative.   HENT: Negative.   Eyes: Negative.   Respiratory: Negative.   Cardiovascular: Negative.   Gastrointestinal: Negative.   Genitourinary: Negative.   Musculoskeletal: Negative.   Skin: Negative.   Neurological: Negative.   Endo/Heme/Allergies: Negative.   Psychiatric/Behavioral: Positive for depression and substance abuse. The patient is nervous/anxious.     Blood pressure 104/66, pulse 92, temperature 98.6 F (37 C), temperature source Oral, resp. rate 18, height 5\' 5"  (1.651 m), weight 69.4 kg  (153 lb), last menstrual period 10/14/2013.Body mass index is 25.46 kg/(m^2).  General Appearance: Fairly Groomed  Engineer, water::  Fair  Speech:  Clear and Coherent  Volume:  Decreased  Mood:  Anxious and Depressed  Affect:  anxious, depressed  Thought Process:  Coherent and Goal Directed  Orientation:  Full (Time, Place, and Person)  Thought Content:  Rumination, symptoms worries, concerns  Suicidal Thoughts:  No  Homicidal Thoughts:  No  Memory:  Immediate;   Fair Recent;   Fair Remote;   Fair  Judgement:  Fair  Insight:  Present  Psychomotor Activity:  Restlessness  Concentration:  Fair  Recall:  [employer] of Knowledge:NA  Language: Fair  Akathisia:  No  Handed:    AIMS (if indicated):     Assets:  Desire for Improvement Housing Social Support  Sleep:  Number of Hours: 6.75   Musculoskeletal: Strength & Muscle Tone: within normal limits Gait & Station: normal Patient leans: N/A  Current Medications: Current Facility-Administered Medications  Medication Dose Route Frequency Provider Last Rate Last Dose  . acetaminophen (TYLENOL) tablet 650 mg  650 mg Oral Q6H PRN Waylan Boga, NP   650 mg at 10/22/13 1453  . albuterol (PROVENTIL HFA;VENTOLIN HFA) 108 (90 BASE) MCG/ACT inhaler 2 puff  2 puff Inhalation Q6H PRN Waylan Boga, NP      . alum & mag hydroxide-simeth (MAALOX/MYLANTA) 200-200-20 MG/5ML suspension 30 mL  30 mL Oral Q4H PRN Waylan Boga, NP      . atenolol (TENORMIN) tablet 50 mg  50 mg Oral QPM Waylan Boga, NP  50 mg at 10/23/13 1637  . buPROPion (WELLBUTRIN XL) 24 hr tablet 300 mg  300 mg Oral Daily Waylan Boga, NP   300 mg at 10/23/13 0809  . chlordiazePOXIDE (LIBRIUM) capsule 25 mg  25 mg Oral Q6H PRN Waylan Boga, NP      . Derrill Memo ON 10/24/2013] chlordiazePOXIDE (LIBRIUM) capsule 25 mg  25 mg Oral BH-qamhs Waylan Boga, NP       Followed by  . [START ON 10/25/2013] chlordiazePOXIDE (LIBRIUM) capsule 25 mg  25 mg Oral Daily Waylan Boga, NP      .  cloNIDine (CATAPRES) tablet 0.1 mg  0.1 mg Oral QID Waylan Boga, NP   0.1 mg at 10/23/13 0809   Followed by  . [START ON 10/24/2013] cloNIDine (CATAPRES) tablet 0.1 mg  0.1 mg Oral BH-qamhs Waylan Boga, NP       Followed by  . [START ON 10/27/2013] cloNIDine (CATAPRES) tablet 0.1 mg  0.1 mg Oral QAC breakfast Waylan Boga, NP      . dicyclomine (BENTYL) tablet 20 mg  20 mg Oral Q6H PRN Waylan Boga, NP   20 mg at 10/22/13 0823  . [START ON 10/24/2013] DULoxetine (CYMBALTA) DR capsule 60 mg  60 mg Oral Daily Nicholaus Bloom, MD      . feeding supplement (ENSURE COMPLETE) (ENSURE COMPLETE) liquid 237 mL  237 mL Oral BID BM Darrol Jump, RD      . fluticasone (FLONASE) 50 MCG/ACT nasal spray 1 spray  1 spray Each Nare Daily PRN Waylan Boga, NP      . gabapentin (NEURONTIN) capsule 300 mg  300 mg Oral QHS Waylan Boga, NP   300 mg at 10/22/13 2154  . hydrOXYzine (ATARAX/VISTARIL) tablet 25 mg  25 mg Oral Q6H PRN Waylan Boga, NP   25 mg at 10/22/13 2313  . hyoscyamine (LEVBID) 0.375 MG 12 hr tablet 0.375 mg  0.375 mg Oral Q12H PRN Waylan Boga, NP      . ibuprofen (ADVIL,MOTRIN) tablet 600 mg  600 mg Oral Q8H PRN Waylan Boga, NP   600 mg at 10/23/13 1201  . ketotifen (ZADITOR) 0.025 % ophthalmic solution 1 drop  1 drop Both Eyes BID PRN Waylan Boga, NP   1 drop at 10/23/13 0624  . loperamide (IMODIUM) capsule 2-4 mg  2-4 mg Oral PRN Waylan Boga, NP      . magnesium hydroxide (MILK OF MAGNESIA) suspension 30 mL  30 mL Oral Daily PRN Waylan Boga, NP      . methocarbamol (ROBAXIN) tablet 500 mg  500 mg Oral Q8H PRN Waylan Boga, NP   500 mg at 10/22/13 2313  . multivitamin with minerals tablet 1 tablet  1 tablet Oral Daily Waylan Boga, NP   1 tablet at 10/23/13 0809  . nicotine (NICODERM CQ - dosed in mg/24 hours) patch 21 mg  21 mg Transdermal Daily Waylan Boga, NP   21 mg at 10/23/13 0806  . ondansetron (ZOFRAN-ODT) disintegrating tablet 4 mg  4 mg Oral Q6H PRN Waylan Boga, NP   4 mg at 10/23/13  0830  . pantoprazole (PROTONIX) EC tablet 40 mg  40 mg Oral QHS Waylan Boga, NP   40 mg at 10/22/13 2154  . thiamine (VITAMIN B-1) tablet 100 mg  100 mg Oral Daily Waylan Boga, NP   100 mg at 10/23/13 0809  . traZODone (DESYREL) tablet 200 mg  200 mg Oral QHS Waylan Boga, NP   200 mg at 10/22/13 2154  Lab Results: No results found for this or any previous visit (from the past 48 hour(s)).  Physical Findings: AIMS: Facial and Oral Movements Muscles of Facial Expression: None, normal Lips and Perioral Area: None, normal Jaw: None, normal Tongue: None, normal,Extremity Movements Upper (arms, wrists, hands, fingers): None, normal Lower (legs, knees, ankles, toes): None, normal, Trunk Movements Neck, shoulders, hips: None, normal, Overall Severity Severity of abnormal movements (highest score from questions above): None, normal Incapacitation due to abnormal movements: None, normal Patient's awareness of abnormal movements (rate only patient's report): No Awareness, Dental Status Current problems with teeth and/or dentures?: No Does patient usually wear dentures?: No  CIWA:  CIWA-Ar Total: 0 COWS:  COWS Total Score: 3  Treatment Plan Summary: Daily contact with patient to assess and evaluate symptoms and progress in treatment Medication management  Plan: Supportive approach/coping skills/relapse prevention           CBT/mindfulness           Continue detox           Reassess and address the co morbidities           Increase Cymbalta to 60 mg in AM  Medical Decision Making Problem Points:  Review of psycho-social stressors (1) Data Points:  Review of new medications or change in dosage (2) Review or order of Psychological tests (1)  I certify that inpatient services furnished can reasonably be expected to improve the patient's condition.   Nicholaus Bloom 10/23/2013, 5:25 PM

## 2013-10-23 NOTE — BHH Group Notes (Signed)
[place] LCSW Group Therapy  10/23/2013  1:15 PM   Type of Therapy:  Group Therapy  Participation Level:  Active  Participation Quality:  Attentive, Sharing and Supportive  Affect:  Appropriate   Cognitive:  Alert and Oriented  Insight:  Developing/Improving and Engaged  Engagement in Therapy:  Developing/Improving and Engaged  Modes of Intervention:  Clarification, Confrontation, Discussion, Education, Exploration, Limit-setting, Orientation, Problem-solving, Rapport Building, Art therapist, Socialization and Support  Summary of Progress/Problems: The topic for group today was emotional regulation.  This group focused on both positive and negative emotion identification and allowed group members to process ways to identify feelings, regulate negative emotions, and find healthy ways to manage internal/external emotions. Group members were asked to reflect on a time when their reaction to an emotion led to a negative outcome and explored how alternative responses using emotion regulation would have benefited them. Group members were also asked to discuss a time when emotion regulation was utilized when a negative emotion was experienced. Adriana Hall shared that she struggles with anxiety. Adriana Hall shows progress in the group setting and improving insight AEB her ability to process how "communicating with my husband, going to therapy, and taking time to myself each day to reflect" will help her better regulate anxiety in the future.   [place], [place]   10/23/2013  3:10 PM

## 2013-10-23 NOTE — Progress Notes (Signed)
D Pt. Denies SI and HI, no present complaints of pain or discomfort noted.  A  Writer offered support and encouragement, discussed discharge plans with pt.  R Pt. Remains safe on the unit,   Reports that she feels much brighter today and feels that it is due to a medication change from Celexa to Cymbalta.   Pt did not receive the clonidin due to low BP today, Probation officer discussed with MD and he felt it was better to give pt. Her beta blocker and stop the Clonidin protocol.

## 2013-10-24 DIAGNOSIS — F1994 Other psychoactive substance use, unspecified with psychoactive substance-induced mood disorder: Secondary | ICD-10-CM

## 2013-10-24 MED ORDER — ROPINIROLE HCL 0.25 MG PO TABS
0.2500 mg | ORAL_TABLET | Freq: Every day | ORAL | Status: DC
  Administered 2013-10-24: 0.25 mg via ORAL
  Filled 2013-10-24: qty 4
  Filled 2013-10-24 (×2): qty 1

## 2013-10-24 NOTE — Progress Notes (Signed)
Patient ID: Adriana Hall, female   DOB: Aug 24, 1970, 43 y.o.   MRN: [id number] She has been up and about and to groups interacting more today with peers and staff. Affect is brighter says her mood is great that she has more energy. Self inventory: Depression 1, hopelessness 1, denies withdrawal symptoms and SI.

## 2013-10-24 NOTE — Progress Notes (Signed)
[facility] MD Progress Note  10/24/2013 3:11 PM Adriana Hall  MRN:  [id number] Subjective:  Continues to get more stable. She is being more honest with her husband as he has encouraged her to come to him for support. She states that she allowed herself to get very overwhelmed and kept everything inside and used pain pills and benzo's to cope. Sates she has learned better coping skills while she has been here. Plans to get involved in counseling.  Diagnosis:   DSM5: Schizophrenia Disorders:  none Obsessive-Compulsive Disorders:  none Trauma-Stressor Disorders:  Posttraumatic Stress Disorder (309.81) Substance/Addictive Disorders:  Opioid Disorder - Severe (304.00), Benzodiazepine use disorder Depressive Disorders:  Major Depressive Disorder - Severe (296.23) Total Time spent with patient: 30 minutes  Axis I: Substance Induced Mood Disorder  ADL's:  Intact  Sleep: Fair  Appetite:  Fair  Suicidal Ideation:  Plan:  denies Intent:  denies Means:  denies Homicidal Ideation:  Plan:  denies Intent:  denies Means:  denies AEB (as evidenced by):  Psychiatric Specialty Exam: Physical Exam  Review of Systems  Constitutional: Negative.   HENT: Negative.   Eyes: Positive for discharge.  Respiratory: Negative.   Cardiovascular: Negative.   Gastrointestinal: Negative.   Genitourinary: Negative.   Musculoskeletal: Negative.   Skin: Negative.   Neurological: Negative.   Endo/Heme/Allergies: Negative.   Psychiatric/Behavioral: Positive for depression and substance abuse. The patient is nervous/anxious.     Blood pressure 116/65, pulse 68, temperature 98.1 F (36.7 C), temperature source Oral, resp. rate 18, height 5\' 5"  (1.651 m), weight 69.4 kg (153 lb), last menstrual period 10/14/2013.Body mass index is 25.46 kg/(m^2).  General Appearance: Fairly Groomed  Engineer, water::  Fair  Speech:  Clear and Coherent  Volume:  Normal  Mood:  Anxious and worried  Affect:  Appropriate  Thought  Process:  Coherent and Goal Directed  Orientation:  Full (Time, Place, and Person)  Thought Content:  worries, cocerns  Suicidal Thoughts:  No  Homicidal Thoughts:  No  Memory:  Immediate;   Fair Recent;   Fair Remote;   Fair  Judgement:  Fair  Insight:  Present  Psychomotor Activity:  Normal  Concentration:  Fair  Recall:  [employer] of [place]: Fair  Akathisia:  No  Handed:    AIMS (if indicated):     Assets:  Desire for Improvement Housing Social Support  Sleep:  Number of Hours: 6.75   Musculoskeletal: Strength & Muscle Tone: within normal limits Gait & Station: normal Patient leans: N/A  Current Medications: Current Facility-Administered Medications  Medication Dose Route Frequency Provider Last Rate Last Dose  . acetaminophen (TYLENOL) tablet 650 mg  650 mg Oral Q6H PRN Waylan Boga, NP   650 mg at 10/22/13 1453  . albuterol (PROVENTIL HFA;VENTOLIN HFA) 108 (90 BASE) MCG/ACT inhaler 2 puff  2 puff Inhalation Q6H PRN Waylan Boga, NP      . alum & mag hydroxide-simeth (MAALOX/MYLANTA) 200-200-20 MG/5ML suspension 30 mL  30 mL Oral Q4H PRN Waylan Boga, NP      . atenolol (TENORMIN) tablet 50 mg  50 mg Oral QPM Waylan Boga, NP   50 mg at 10/23/13 1637  . buPROPion (WELLBUTRIN XL) 24 hr tablet 300 mg  300 mg Oral Daily Waylan Boga, NP   300 mg at 10/24/13 0805  . chlordiazePOXIDE (LIBRIUM) capsule 25 mg  25 mg Oral Q6H PRN Waylan Boga, NP      . chlordiazePOXIDE (LIBRIUM) capsule 25 mg  25 mg Oral BH-qamhs Waylan Boga, NP   25 mg at 10/24/13 0805   Followed by  . [START ON 10/25/2013] chlordiazePOXIDE (LIBRIUM) capsule 25 mg  25 mg Oral Daily Waylan Boga, NP      . dicyclomine (BENTYL) tablet 20 mg  20 mg Oral Q6H PRN Waylan Boga, NP   20 mg at 10/22/13 0823  . DULoxetine (CYMBALTA) DR capsule 60 mg  60 mg Oral Daily Nicholaus Bloom, MD   60 mg at 10/24/13 0806  . feeding supplement (ENSURE COMPLETE) (ENSURE COMPLETE) liquid 237 mL  237 mL Oral BID BM  Darrol Jump, RD   237 mL at 10/23/13 2006  . fluticasone (FLONASE) 50 MCG/ACT nasal spray 1 spray  1 spray Each Nare Daily PRN Waylan Boga, NP      . gabapentin (NEURONTIN) capsule 300 mg  300 mg Oral QHS Waylan Boga, NP   300 mg at 10/23/13 2138  . hydrOXYzine (ATARAX/VISTARIL) tablet 25 mg  25 mg Oral Q6H PRN Waylan Boga, NP   25 mg at 10/22/13 2313  . hyoscyamine (LEVBID) 0.375 MG 12 hr tablet 0.375 mg  0.375 mg Oral Q12H PRN Waylan Boga, NP      . ibuprofen (ADVIL,MOTRIN) tablet 600 mg  600 mg Oral Q8H PRN Waylan Boga, NP   600 mg at 10/24/13 0901  . ketotifen (ZADITOR) 0.025 % ophthalmic solution 1 drop  1 drop Both Eyes BID PRN Waylan Boga, NP   1 drop at 10/24/13 0810  . loperamide (IMODIUM) capsule 2-4 mg  2-4 mg Oral PRN Waylan Boga, NP      . magnesium hydroxide (MILK OF MAGNESIA) suspension 30 mL  30 mL Oral Daily PRN Waylan Boga, NP      . methocarbamol (ROBAXIN) tablet 500 mg  500 mg Oral Q8H PRN Waylan Boga, NP   500 mg at 10/23/13 2140  . multivitamin with minerals tablet 1 tablet  1 tablet Oral Daily Waylan Boga, NP   1 tablet at 10/24/13 0806  . nicotine (NICODERM CQ - dosed in mg/24 hours) patch 21 mg  21 mg Transdermal Daily Waylan Boga, NP   21 mg at 10/24/13 0805  . ondansetron (ZOFRAN-ODT) disintegrating tablet 4 mg  4 mg Oral Q6H PRN Waylan Boga, NP   4 mg at 10/24/13 1450  . pantoprazole (PROTONIX) EC tablet 40 mg  40 mg Oral QHS Waylan Boga, NP   40 mg at 10/23/13 2140  . rOPINIRole (REQUIP) tablet 0.25 mg  0.25 mg Oral QHS Nicholaus Bloom, MD      . thiamine (VITAMIN B-1) tablet 100 mg  100 mg Oral Daily Waylan Boga, NP   100 mg at 10/24/13 0806  . traZODone (DESYREL) tablet 200 mg  200 mg Oral QHS Waylan Boga, NP   200 mg at 10/23/13 2138    Lab Results: No results found for this or any previous visit (from the past 48 hour(s)).  Physical Findings: AIMS: Facial and Oral Movements Muscles of Facial Expression: None, normal Lips and Perioral Area:  None, normal Jaw: None, normal Tongue: None, normal,Extremity Movements Upper (arms, wrists, hands, fingers): None, normal Lower (legs, knees, ankles, toes): None, normal, Trunk Movements Neck, shoulders, hips: None, normal, Overall Severity Severity of abnormal movements (highest score from questions above): None, normal Incapacitation due to abnormal movements: None, normal Patient's awareness of abnormal movements (rate only patient's report): No Awareness, Dental Status Current problems with teeth and/or dentures?: No Does patient usually wear dentures?:  No  CIWA:  CIWA-Ar Total: 4 COWS:  COWS Total Score: 3  Treatment Plan Summary: Daily contact with patient to assess and evaluate symptoms and progress in treatment Medication management  Plan: Supportive approach/coping skills/relapse prevention           Complete the detox           CBT;mindfulness           Continue to work on stress management/communictaion  Medical Decision Making Problem Points:  Review of last therapy session (1) Data Points:  Review of medication regiment & side effects (2)  I certify that inpatient services furnished can reasonably be expected to improve the patient's condition.   Nicholaus Bloom 10/24/2013, 3:11 PM

## 2013-10-24 NOTE — BHH Group Notes (Signed)
[place] LCSW Group Therapy  10/24/2013 2:28 PM  Type of Therapy:  Group Therapy  Participation Level:  Active  Participation Quality:  Attentive  Affect:  Appropriate  Cognitive:  Alert and Oriented  Insight:  Engaged  Engagement in Therapy:  Engaged  Modes of Intervention:  Confrontation, Discussion, Education, Exploration, Problem-solving, Rapport Building, Socialization and Support  Summary of Progress/Problems:  Finding Balance in Life. Today's group focused on defining balance in one's own words, identifying things that can knock one off balance, and exploring healthy ways to maintain balance in life. Group members were asked to provide an example of a time when they felt off balance, describe how they handled that situation,and process healthier ways to regain balance in the future. Group members were asked to share the most important tool for maintaining balance that they learned while at [facility] and how they plan to apply this method after discharge. Adriana Hall was attentive and engaged throughout today's therapy group. She shared that to her, balance means: " not isolating and making an effort to be around people, making time for crafts, living simply, and having a good relationship with my family." Adriana Hall shared that her pain pill abuse got in the way of her sense of balance. Adriana Hall shows progress in the group setting and improving insight AEB her ability to process how "going to NA groups with my husband, taking time to reflect and grieve over the death of my dog, and taking my meds appropriately" will help her acheive a sense of balance in life.     Damier Disano Smart LCSWA  10/24/2013, 2:28 PM

## 2013-10-24 NOTE — ED Provider Notes (Signed)
Medical screening examination/treatment/procedure(s) were performed by non-physician practitioner and as supervising physician I was immediately available for consultation/collaboration.   EKG Interpretation None       Adriana Hall. Alvino Chapel, MD 10/24/13 [id number]

## 2013-10-24 NOTE — Progress Notes (Signed)
Pt attended karaoke group this evening. Pt participate and was appropriate in group.

## 2013-10-25 DIAGNOSIS — F1120 Opioid dependence, uncomplicated: Secondary | ICD-10-CM

## 2013-10-25 DIAGNOSIS — F339 Major depressive disorder, recurrent, unspecified: Secondary | ICD-10-CM

## 2013-10-25 MED ORDER — TRAZODONE HCL 100 MG PO TABS: 200.0000 mg | ORAL_TABLET | Freq: Every day | ORAL | Status: DC

## 2013-10-25 MED ORDER — GABAPENTIN 300 MG PO CAPS: 300.0000 mg | ORAL_CAPSULE | Freq: Every day | ORAL | Status: DC

## 2013-10-25 MED ORDER — ROPINIROLE HCL 0.25 MG PO TABS: 0.2500 mg | ORAL_TABLET | Freq: Every day | ORAL | Status: DC

## 2013-10-25 MED ORDER — ATENOLOL 50 MG PO TABS: 50.0000 mg | ORAL_TABLET | Freq: Every evening | ORAL | Status: DC

## 2013-10-25 MED ORDER — BUPROPION HCL ER (XL) 300 MG PO TB24: 300.0000 mg | ORAL_TABLET | Freq: Every day | ORAL | Status: DC

## 2013-10-25 MED ORDER — ACETAMINOPHEN 500 MG PO TABS: 1000.0000 mg | ORAL_TABLET | Freq: Four times a day (QID) | ORAL | Status: DC | PRN

## 2013-10-25 MED ORDER — IBUPROFEN 200 MG PO TABS: 600.0000 mg | ORAL_TABLET | Freq: Four times a day (QID) | ORAL | Status: DC | PRN

## 2013-10-25 MED ORDER — DULOXETINE HCL 60 MG PO CPEP: 60.0000 mg | ORAL_CAPSULE | Freq: Every day | ORAL | Status: DC

## 2013-10-25 MED ORDER — HYOSCYAMINE SULFATE ER 0.375 MG PO TB12: 0.3750 mg | ORAL_TABLET | Freq: Two times a day (BID) | ORAL | Status: DC | PRN

## 2013-10-25 MED ORDER — ALBUTEROL SULFATE HFA 108 (90 BASE) MCG/ACT IN AERS: 2.0000 | INHALATION_SPRAY | Freq: Four times a day (QID) | RESPIRATORY_TRACT | Status: DC | PRN

## 2013-10-25 MED ORDER — FLUTICASONE PROPIONATE 50 MCG/ACT NA SUSP: 1.0000 | Freq: Every day | NASAL | Status: DC | PRN

## 2013-10-25 MED ORDER — MAGNESIUM 500 MG PO TABS: 500.0000 mg | ORAL_TABLET | Freq: Every day | ORAL | Status: DC

## 2013-10-25 MED ORDER — KETOTIFEN FUMARATE 0.025 % OP SOLN: 1.0000 [drp] | Freq: Two times a day (BID) | OPHTHALMIC | Status: DC | PRN

## 2013-10-25 MED ORDER — OMEPRAZOLE MAGNESIUM 20 MG PO TBEC: 40.0000 mg | DELAYED_RELEASE_TABLET | Freq: Every day | ORAL | Status: DC

## 2013-10-25 NOTE — Progress Notes (Signed)
D. Pt up and visible in milieu this evening, attending and participating in various milieu activities. Pt spoke about how she is to discharge sometime tomorrow and spoke about how she feels ready to leave. Pt spoke about her addiction issues and how she was unhappy in her life and spoke about the importance to not use anymore. A. Support and encouragement provided, medication education given. R. Pt verbalized understanding, safety maintained.

## 2013-10-25 NOTE — Discharge Summary (Signed)
Physician Discharge Summary Note  Patient:  Adriana Hall is an 43 y.o., female MRN:  [id number] DOB:  [birth year] Patient phone:  [phone] (home)  Patient address:   [place] [address],  Total Time spent with patient: Greater than 30 minutes  Date of Admission:  10/21/2013 Date of Discharge: 10/25/13  Reason for Admission: Opioid/benzodiazepine detox  Discharge Diagnoses: Active Problems:   Benzodiazepine dependence   Opioid dependence   Major depression, recurrent   Psychiatric Specialty Exam: Physical Exam  Constitutional: She is oriented to person, place, and time. She appears well-developed.  HENT:  Head: Normocephalic.  Eyes: Pupils are equal, round, and reactive to light.  Neck: Normal range of motion.  Cardiovascular: Normal rate.   Respiratory: Effort normal.  GI: Soft.  Genitourinary:  Denies any issues in this area  Musculoskeletal: Normal range of motion.  Neurological: She is alert and oriented to person, place, and time.  Skin: Skin is warm and dry.  Psychiatric: Her speech is normal and behavior is normal. Judgment and thought content normal. Her mood appears not anxious. Her affect is not angry, not blunt, not labile and not inappropriate. Cognition and memory are normal. She does not exhibit a depressed mood.    Review of Systems  Constitutional: Negative.   HENT: Negative.   Eyes: Negative.   Respiratory: Negative.   Cardiovascular: Negative.   Gastrointestinal: Negative.   Genitourinary: Negative.   Musculoskeletal: Negative.   Skin: Negative.   Neurological: Negative.   Endo/Heme/Allergies: Negative.   Psychiatric/Behavioral: Positive for depression (Stable) and substance abuse (Benzodiazepine dependence, Opioid dependence). Negative for suicidal ideas, hallucinations and memory loss. The patient has insomnia. The patient is not nervous/anxious.     Blood pressure 94/61, pulse 69, temperature 98 F (36.7 C),  temperature source Oral, resp. rate 16, height 5\' 5"  (1.651 m), weight 69.4 kg (153 lb), last menstrual period 10/14/2013.Body mass index is 25.46 kg/(m^2).   General Appearance: Fairly Groomed   Engineer, water:: Fair   Speech: Clear and Coherent   Volume: Normal   Mood: Euthymic   Affect: Appropriate   Thought Process: Coherent and Goal Directed   Orientation: Full (Time, Place, and Person)   Thought Content: plans as she gets out of here, relapse prevention plan   Suicidal Thoughts: No   Homicidal Thoughts: No   Memory: Immediate; Fair  Recent; Fair  Remote; Fair   Judgement: Fair   Insight: Present   Psychomotor Activity: Normal   Concentration: Fair   Recall: [employer] of Knowledge:NA   Language: Fair   Akathisia: No   Handed:   AIMS (if indicated):   Assets: Desire for Improvement  Housing  Social Support   Sleep: Number of Hours: 5.75    Past Psychiatric History: Diagnosis:  Opioid dependence, Benzodiazepine dependence, Major depression, recurrent  Hospitalizations: [facility]  Outpatient Care: [facility]  Substance Abuse Care: [facility]  Self-Mutilation: Denies  Suicidal Attempts: Denies attempts and or thoughts  Violent Behaviors: NA   Musculoskeletal: Strength & Muscle Tone: within normal limits Gait & Station: normal Patient leans: N/A  DSM5: Schizophrenia Disorders:  NA Obsessive-Compulsive Disorders:  NA Trauma-Stressor Disorders:  NA Substance/Addictive Disorders:  Opioid Disorder - Severe (304.00), Benzodiazepine dependence Depressive Disorders:  Major Depressive Disorder - Moderate (296.22)  Axis Diagnosis:   AXIS I:  Opioid dependence, Benzodiazepine dependence, Major depression, recurrent AXIS II:  Deferred AXIS III:   Past Medical History  Diagnosis Date  .  Anxiety     10 years  . Arrhythmia     5 years  . Headache(784.0)     Chronic for 30 yrs  . Depression     10 years  . Hypertension   . Colitis 03-01-2011     Colonoscopy  . Hemorrhoids 03-01-2011    Colonoscopy   . Bipolar 1 disorder, depressed   . Bronchitis    AXIS IV:  other psychosocial or environmental problems and polysubstance dependence AXIS V:  62  Level of Care:  OP  Hospital Course:  "My husband took me to the hospital on Sunday afternoon. We were on Vacation, but I did not have any pills to take. I woke up one day and could not function without the pills. I was taken Oxycodone tablets for my back, stomach and migraine headache pains. They were not prescribed for me. I was getting them from a friend. While on this vacation without my pills, I started shaking a lot. I know there is no way I could go through the day without taking these medicines. I told my husband that I'm addicted to these pills and that I need help. I'm here to seek help for my addiction. Not looking for a long term rehab place.   Loletta was admitted to the hospital with her UDS results positive for Benzodiazepine and THC. She reported being an addict and has been abusing opiates as well. She was then ordered and received Clonidine detox protocols. She was also ordered, received and discharged on Wellbutrin 300 mg daily for depression, Duloxetine 60 mg daily for depression/adjunct treatment for pain, Gabapentin 300 mg at bedtime for substance withdrawal syndrome/pain and Trazodone 200 mg Q bedtime for sleep. She was enrolled and participated in the group sessions and AA/NA meetings being offered and held on this unit. She learned coping skills. Caraline was resumed on all her pertinent home medication for her other medical issues. She tolerated her treatment regimen without any significant adverse effects and or reactions.  Shonice has completed detox treatment and her mood is stable. She is currently being discharge to continue treatment at the [facility] here in [place], [place]. She is provided with all the necessary information required to make this appointment without  problems. Upon discharge, she adamantly denies any SIHI, AVH, delusions, paranoia and or withdrawal symptoms. She was provided with a 4 days worth, supply samples of her [facility] discharge medications. She left [facility] with all personal belonging in no distress. Transportation per husband.  Consults:  psychiatry  Significant Diagnostic Studies:  labs: CBC with diff, CMP, UDS, toxicology tests, U/A  Discharge Vitals:   Blood pressure 94/61, pulse 69, temperature 98 F (36.7 C), temperature source Oral, resp. rate 16, height 5\' 5"  (1.651 m), weight 69.4 kg (153 lb), last menstrual period 10/14/2013. Body mass index is 25.46 kg/(m^2). Lab Results:   No results found for this or any previous visit (from the past 72 hour(s)).  Physical Findings: AIMS: Facial and Oral Movements Muscles of Facial Expression: None, normal Lips and Perioral Area: None, normal Jaw: None, normal Tongue: None, normal,Extremity Movements Upper (arms, wrists, hands, fingers): None, normal Lower (legs, knees, ankles, toes): None, normal, Trunk Movements Neck, shoulders, hips: None, normal, Overall Severity Severity of abnormal movements (highest score from questions above): None, normal Incapacitation due to abnormal movements: None, normal Patient's awareness of abnormal movements (rate only patient's report): No Awareness, Dental Status Current problems with teeth and/or dentures?: No Does patient usually wear dentures?: No  CIWA:  CIWA-Ar Total: 3 COWS:  COWS Total Score: 3  Psychiatric Specialty Exam: See Psychiatric Specialty Exam and Suicide Risk Assessment completed by Attending Physician prior to discharge.  Discharge destination:  Home  Is patient on multiple antipsychotic therapies at discharge:  No   Has Patient had three or more failed trials of antipsychotic monotherapy by history:  No  Recommended Plan for Multiple Antipsychotic Therapies: NA     Medication List    STOP taking these medications        ALPRAZolam 0.5 MG tablet  Commonly known as:  XANAX     aspirin-acetaminophen-caffeine 250-250-65 MG per tablet  Commonly known as:  EXCEDRIN MIGRAINE     citalopram 20 MG tablet  Commonly known as:  CELEXA     ESTROVEN MAXIMUM STRENGTH PO     MIDOL COMPLETE 500-60-15 MG Tabs  Generic drug:  Acetaminophen-Caff-Pyrilamine     oxycodone 30 MG immediate release tablet  Commonly known as:  ROXICODONE     oxymetazoline 0.05 % nasal spray  Commonly known as:  AFRIN     pseudoephedrine 30 MG tablet  Commonly known as:  SUDAFED     sodium phosphate enema  Commonly known as:  FLEET     VITAMIN B-12 CR PO     VITAMIN B-6 PO      TAKE these medications     Indication   acetaminophen 500 MG tablet  Commonly known as:  TYLENOL  Take 2 tablets (1,000 mg total) by mouth every 6 (six) hours as needed for moderate pain.   Indication:  Pain     albuterol 108 (90 BASE) MCG/ACT inhaler  Commonly known as:  PROVENTIL HFA;VENTOLIN HFA  Inhale 2 puffs into the lungs every 6 (six) hours as needed for wheezing or shortness of breath.   Indication:  Asthma     atenolol 50 MG tablet  Commonly known as:  TENORMIN  Take 1 tablet (50 mg total) by mouth every evening. For high blood pressure   Indication:  High Blood Pressure     buPROPion 300 MG 24 hr tablet  Commonly known as:  WELLBUTRIN XL  Take 1 tablet (300 mg total) by mouth daily. For depression   Indication:  Major Depressive Disorder     DULoxetine 60 MG capsule  Commonly known as:  CYMBALTA  Take 1 capsule (60 mg total) by mouth daily. For depression   Indication:  Major Depressive Disorder     fluticasone 50 MCG/ACT nasal spray  Commonly known as:  FLONASE  Place 1 spray into both nostrils daily as needed for allergies or rhinitis.   Indication:  Perennial Rhinitis, Hayfever     gabapentin 300 MG capsule  Commonly known as:  NEURONTIN  Take 1 capsule (300 mg total) by mouth at bedtime. For substance withdrawal  syndrome   Indication:  Substance withdrawal syndrome     hyoscyamine 0.375 MG 12 hr tablet  Commonly known as:  LEVBID  Take 1 tablet (0.375 mg total) by mouth every 12 (twelve) hours as needed for cramping.   Indication:  Irritable Bowel Syndrome     ibuprofen 200 MG tablet  Commonly known as:  ADVIL,MOTRIN  Take 3-4 tablets (600-800 mg total) by mouth every 6 (six) hours as needed for moderate pain.   Indication:  Mild to Moderate Pain     ketotifen 0.025 % ophthalmic solution  Commonly known as:  ALLERGY EYE DROPS  Place 1 drop into both eyes 2 (two)  times daily as needed (itchy eyes).   Indication:  Allergic Conjunctivitis     Magnesium 500 MG Tabs  Take 1 tablet (500 mg total) by mouth at bedtime. Constipation   Indication:  Constipation     omeprazole 20 MG tablet  Commonly known as:  PRILOSEC OTC  Take 2 tablets (40 mg total) by mouth at bedtime. For acid reflux   Indication:  Heartburn     rOPINIRole 0.25 MG tablet  Commonly known as:  REQUIP  Take 1 tablet (0.25 mg total) by mouth at bedtime. For restless leg   Indication:  Restless Leg Syndrome     traZODone 100 MG tablet  Commonly known as:  DESYREL  Take 2 tablets (200 mg total) by mouth at bedtime. For sleep   Indication:  Trouble Sleeping       Follow-up Information   Follow up with [facility]  On 10/29/2013. (Appt. with Mr. [facility] at 11:00AM for assessment (medication management, therapy, and SA IOP). Make sure to bring insurance card and arrive 15 minutes early. )    Contact information:   [address] [place], [place] [address] Phone: [phone] Fax: [phone]     Follow-up recommendations:  Activity:  As tolerated Diet: As recommended by your primary care doctor. Keep all scheduled follow-up appointments as recommended.  Comments: Take all your medications as prescribed by your mental healthcare provider. Report any adverse effects and or reactions from your medicines to your outpatient  provider promptly. Patient is instructed and cautioned to not engage in alcohol and or illegal drug use while on prescription medicines. In the event of worsening symptoms, patient is instructed to call the crisis hotline, 911 and or go to the nearest ED for appropriate evaluation and treatment of symptoms. Follow-up with your primary care provider for your other medical issues, concerns and or health care needs.   Total Discharge Time:  Greater than 30 minutes.  Signed: Encarnacion Slates, PMHNP-BC 10/25/2013, 2:21 PM Personally assessed the patient, formulated the treatment plan Geralyn Flash A. Sabra Heck, M.D.

## 2013-10-25 NOTE — Progress Notes (Signed)
[facility] Adult Case Management Discharge Plan :  Will you be returning to the same living situation after discharge: Yes,  home  At discharge, do you have transportation home?:Yes,  husband at 1pm today Do you have the ability to pay for your medications:Yes,  [employer]  Release of information consent forms completed and submitted to Medical Records by CSW.  Patient to Follow up at: Follow-up Information   Follow up with [facility]  On 10/29/2013. (Appt. with Mr. [facility] at 11:00AM for assessment (medication management, therapy, and SA IOP). Make sure to bring insurance card and arrive 15 minutes early. )    Contact information:   [address] [place], [place] [address] Phone: [phone] Fax: [phone]      Patient denies SI/HI:   Yes,  during group/self report.     Safety Planning and Suicide Prevention discussed:  Yes,  SPE completed with pt. Contact attempts made with pt's husband. SPI pamphlet provided to pt and she was encouraged to share information with support network, ask questions, and talk about any concerns relating to SPE.  Kristine Chahal Smart LCSWA  10/25/2013, 10:53 AM

## 2013-10-25 NOTE — BHH Group Notes (Signed)
[facility] LCSW Aftercare Discharge Planning Group Note   10/25/2013 9:58 AM  Participation Quality: Appropriate   Mood/Affect:  Appropriate  Depression Rating:  2  Anxiety Rating:  1  Thoughts of Suicide:  No Will you contract for safety?   NA  Current AVH:  No  Plan for Discharge/Comments: I'm going home today and plan to follow up at the [facility]. I'm eventually going to get set up with [facility].     Transportation Means: husband  Supports: husband  Civil engineer, contracting

## 2013-10-25 NOTE — Progress Notes (Signed)
Patient ID: Adriana Hall, female   DOB: [birth year], 43 y.o.   MRN: [id number]   D: Patient reports that she feels ready for discharge. Denies any withdrawal or SI at present. Treatment team met this am and felt patient ready for d/c. A: Obtained all belongings out of room and locker, prescriptions, sample meds,and follow-up appointment. R: Husband here to pick patient up at this time.

## 2013-10-25 NOTE — BHH Suicide Risk Assessment (Signed)
Suicide Risk Assessment  Discharge Assessment     Demographic Factors:  Caucasian  Total Time spent with patient: 30 minutes  Psychiatric Specialty Exam:     Blood pressure 94/61, pulse 69, temperature 98 F (36.7 C), temperature source Oral, resp. rate 16, height 5\' 5"  (1.651 m), weight 69.4 kg (153 lb), last menstrual period 10/14/2013.Body mass index is 25.46 kg/(m^2).  General Appearance: Fairly Groomed  Engineer, water::  Fair  Speech:  Clear and Coherent  Volume:  Normal  Mood:  Euthymic  Affect:  Appropriate  Thought Process:  Coherent and Goal Directed  Orientation:  Full (Time, Place, and Person)  Thought Content:  plans as she gets out of here, relapse prevention plan  Suicidal Thoughts:  No  Homicidal Thoughts:  No  Memory:  Immediate;   Fair Recent;   Fair Remote;   Fair  Judgement:  Fair  Insight:  Present  Psychomotor Activity:  Normal  Concentration:  Fair  Recall:  [employer] of Knowledge:NA  Language: Fair  Akathisia:  No  Handed:    AIMS (if indicated):     Assets:  Desire for Improvement Housing Social Support  Sleep:  Number of Hours: 5.75    Musculoskeletal: Strength & Muscle Tone: within normal limits Gait & Station: normal Patient leans: N/A   Mental Status Per Nursing Assessment::   On Admission:  NA  Current Mental Status by Physician: In full contact with reality, no active S/S of withdrawal. Willing and motivated to pursue outpatient treatment.    Loss Factors: NA  Historical Factors: NA  Risk Reduction Factors:   Sense of responsibility to family, Living with another person, especially a relative and Positive social support  Continued Clinical Symptoms:  Depression:   Comorbid alcohol abuse/dependence Alcohol/Substance Abuse/Dependencies  Cognitive Features That Contribute To Risk:  Polarized thinking Thought constriction (tunnel vision)    Suicide Risk:  Minimal: No identifiable suicidal ideation.  Patients presenting  with no risk factors but with morbid ruminations; may be classified as minimal risk based on the severity of the depressive symptoms  Discharge Diagnoses:   AXIS I:  Opioid Dependence, Benzodiazepine Dependence, Major Depression Recurrent AXIS II:  No diagnosis AXIS III:   Past Medical History  Diagnosis Date  . Anxiety     10 years  . Arrhythmia     5 years  . Headache(784.0)     Chronic for 30 yrs  . Depression     10 years  . Hypertension   . Colitis 03-01-2011    Colonoscopy  . Hemorrhoids 03-01-2011    Colonoscopy   . Bipolar 1 disorder, depressed   . Bronchitis    AXIS IV:  other psychosocial or environmental problems AXIS V:  61-70 mild symptoms  Plan Of Care/Follow-up recommendations:  Activity:  as tolerated Diet:  regular Follow up [place] Is patient on multiple antipsychotic therapies at discharge:  No   Has Patient had three or more failed trials of antipsychotic monotherapy by history:  No  Recommended Plan for Multiple Antipsychotic Therapies: NA    Nicholaus Bloom 10/25/2013, 12:05 PM

## 2013-10-30 NOTE — Progress Notes (Signed)
Patient Discharge Instructions:  After Visit Summary (AVS):   Faxed to:  10/30/13 Discharge Summary Note:   Faxed to:  10/30/13 Psychiatric Admission Assessment Note:   Faxed to:  10/30/13 Suicide Risk Assessment - Discharge Assessment:   Faxed to:  10/30/13 Faxed/Sent to the Next [facility] Care provider:  10/30/13 Faxed to [facility], 10/30/2013, 3:50 PM

## 2013-11-05 ENCOUNTER — Encounter: Payer: 59 | Admitting: Family Medicine

## 2014-02-23 ENCOUNTER — Other Ambulatory Visit: Payer: Self-pay | Admitting: Family Medicine

## 2014-02-24 NOTE — Telephone Encounter (Signed)
Pt has not been seen in this office in a long time.  Recently has been going to another PCP.  Records indicate she has been dismissed by them.  Refill denied

## 2014-03-27 ENCOUNTER — Other Ambulatory Visit: Payer: Self-pay | Admitting: Family Medicine

## 2014-03-27 NOTE — Telephone Encounter (Signed)
Refill denied.   Requires office visit before any further refills can be given.   Letter sent.

## 2014-05-21 ENCOUNTER — Ambulatory Visit (INDEPENDENT_AMBULATORY_CARE_PROVIDER_SITE_OTHER): Payer: Self-pay | Admitting: Family Medicine

## 2014-05-21 ENCOUNTER — Encounter: Payer: Self-pay | Admitting: Family Medicine

## 2014-05-21 VITALS — BP 148/80 | HR 80 | Temp 98.7°F | Resp 18 | Ht 65.0 in | Wt 158.0 lb

## 2014-05-21 DIAGNOSIS — I456 Pre-excitation syndrome: Secondary | ICD-10-CM

## 2014-05-21 DIAGNOSIS — I471 Supraventricular tachycardia, unspecified: Secondary | ICD-10-CM

## 2014-05-21 DIAGNOSIS — F332 Major depressive disorder, recurrent severe without psychotic features: Secondary | ICD-10-CM

## 2014-05-21 DIAGNOSIS — K219 Gastro-esophageal reflux disease without esophagitis: Secondary | ICD-10-CM

## 2014-05-21 DIAGNOSIS — F411 Generalized anxiety disorder: Secondary | ICD-10-CM

## 2014-05-21 DIAGNOSIS — F4310 Post-traumatic stress disorder, unspecified: Secondary | ICD-10-CM

## 2014-05-21 DIAGNOSIS — I10 Essential (primary) hypertension: Secondary | ICD-10-CM

## 2014-05-21 MED ORDER — PAROXETINE HCL 20 MG PO TABS: 20.0000 mg | ORAL_TABLET | Freq: Every day | ORAL | Status: DC

## 2014-05-21 MED ORDER — ATENOLOL 50 MG PO TABS: 50.0000 mg | ORAL_TABLET | Freq: Every evening | ORAL | Status: DC

## 2014-05-21 MED ORDER — TRAZODONE HCL 100 MG PO TABS: 200.0000 mg | ORAL_TABLET | Freq: Every day | ORAL | Status: DC

## 2014-05-21 NOTE — Assessment & Plan Note (Signed)
Continue omeprazole OTC

## 2014-05-21 NOTE — Patient Instructions (Signed)
Start paxil 20mg  once a day  Take trazodone as prescribed Restart atenolol We will look into a mental health provider and medication assistance Call for any concerns F/U 6 weeks

## 2014-05-21 NOTE — Assessment & Plan Note (Addendum)
multuiple medications ? History of bIpolar, would be more bipolar II based on history but this is not dcumented anywhere Trial of paxil , start 20mg  has MDD, GAD, PTSD Trazodone will be continued, but will bump back up to 200mg  at bedtime Continue gabapentin 300mg  at bedtime She needs care under psychiatrist based on history, multiple medications, intolerance to meds, will work to find a psychiatrist

## 2014-05-21 NOTE — Assessment & Plan Note (Signed)
Restart atenolol.

## 2014-05-21 NOTE — Assessment & Plan Note (Signed)
Restart atenolol 50mg  once a day, Unfortunately uninsured, needs blood work

## 2014-05-21 NOTE — Progress Notes (Signed)
Patient ID: Adriana Hall, female   DOB: [birth year], 43 y.o.   MRN: [id number]   Subjective:    Patient ID: Adriana Hall, female    DOB: 17-Feb-1971, 43 y.o.   MRN: [id number]  Patient presents for Medication Refill patient here to reestablish care. She was last seen in our office in March 2014. She then established care over at [facility] or at [facility] however there was some type of verbal altercation and she was dismissed from the practice. They're trying to set her up with psychiatry cardiology as well as GYN but she states that she never received any information about these referrals. She was then admitted to the hospital in May 2015 after she went in stating that she was on Xanax as well as painkillers which she was getting from her friend. She states she was in a severe depression because she lost her dog who is very close to the family and she also has severe depressive mood swings with her menstrual cycle which has been going on for a few years. She has history of major depression as well as anxiety and questionable bipolar disorder. She started taking meds from her friend and then when she wanted to come off of them she started going to withdrawal therefore was admitted she was told to follow with the [facility] however she was not given a psychiatrist per report but told that she was just a drug addict and she did not return. She has been uninsured and she does not qualify for Medicare or disability and also does not qualify for Medicaid. She's been off of her psychiatric medications for the past 5 months. She wants to go back on her medication states that things are very crazy right now she does not feel herself she is crying all the time is very anxious.  She also has history of Wolff-Parkinson-White and has had some type of valve replacement in the past. The chart also states coronary artery disease with patient is not aware of this. She was on atenolol but she ran out of this  medication about a month ago. She also has history of irritable bowel syndrome and takes high-dose amine as needed. The only medication she has been taken recently is gabapentin because she had a significant amount left over from previous prescription she takes 300 mg at bedtime  She states no psychiatrist will see her because she is uninsured though she is able to pay out of pocket.   States cymbalta did not help, wellbutrin causes rage, prozac did not work, not sure if abilify or seroquel helped, trazodone does help with sleep States when she was with [facility] they changed her meds every 3 weeks   Review Of Systems:  GEN- + fatigue, fever, weight loss,weakness, recent illness HEENT- denies eye drainage, change in vision, nasal discharge, CVS- denies chest pain, palpitations RESP- denies SOB, cough, wheeze ABD- denies N/V, change in stools, abd pain GU- denies dysuria, hematuria, dribbling, incontinence MSK- denies joint pain, muscle aches, injury Neuro- denies headache, dizziness, syncope, seizure activity       Objective:    BP 148/80 mmHg  Pulse 80  Temp(Src) 98.7 F (37.1 C) (Oral)  Resp 18  Ht 5\' 5"  (1.651 m)  Wt 158 lb (71.668 kg)  BMI 26.29 kg/m2  LMP 04/30/2014 GEN- NAD, alert and oriented x3 HEENT- PERRL, EOMI, non injected sclera, pink conjunctiva, MMM, oropharynx clear, fair dentition Neck- Supple, no thyromegaly CVS- RRR, 3/6 SEM loudes RSB  RESP-CTAB PSYCH-depressed affect, ,very tearful, not overly anxious, no SI, well groomed EXT- No edema Pulses- Radial 2+        Assessment & Plan:    Able to get meds from [facility] $4 list   Problem List Items Addressed This Visit      Unprioritized   WPW - Primary    Restart atenolol 50mg  once a day, Unfortunately uninsured, needs blood work     Relevant Medications      atenolol (TENORMIN) tablet   SVT/ PSVT/ PAT   Relevant Medications      atenolol (TENORMIN) tablet   Post traumatic stress disorder (PTSD)    Major depression, recurrent    multuiple medications ? History of bIpolar, would be more bipolar II based on history but this is not dcumented anywhere Trial of paxil , start 20mg  has MDD, GAD, PTSD Trazodone will be continued, but will bump back up to 200mg  at bedtime Continue gabapentin 300mg  at bedtime She needs care under psychiatrist based on history, multiple medications, intolerance to meds, will work to find a psychiatrist    Relevant Medications      traZODone (DESYREL) tablet      PARoxetine (PAXIL) tablet   HYPERTENSION, BENIGN    Restart atenolol    Relevant Medications      atenolol (TENORMIN) tablet   GERD (gastroesophageal reflux disease)    Continue omeprazole OTC    GAD (generalized anxiety disorder)      Note: This dictation was prepared with Dragon dictation along with smaller phrase technology. Any transcriptional errors that result from this process are unintentional.

## 2014-06-19 ENCOUNTER — Encounter: Payer: Self-pay | Admitting: *Deleted

## 2014-06-20 ENCOUNTER — Telehealth: Payer: Self-pay | Admitting: *Deleted

## 2014-06-20 NOTE — Telephone Encounter (Signed)
I have been trying to contact pt about a couple of therapist she can go to for self pay, no returns has been made. I called pt on 06/10/14 and called everyday since ended on 06/13/14 and finally sent letter to pt to return my call 06/19/14 to let her know that [facility] in [place] will take walk ins and are on a state fee and will accept self pay patients. Walk ins are available M-F 8-3pm

## 2014-07-02 ENCOUNTER — Ambulatory Visit: Payer: Self-pay | Admitting: Family Medicine

## 2014-07-08 ENCOUNTER — Ambulatory Visit: Payer: Self-pay | Admitting: Family Medicine

## 2014-07-31 ENCOUNTER — Other Ambulatory Visit: Payer: Self-pay | Admitting: Family Medicine

## 2014-07-31 NOTE — Telephone Encounter (Signed)
Refill appropriate and filled per protocol. 

## 2014-09-21 ENCOUNTER — Other Ambulatory Visit: Payer: Self-pay | Admitting: Family Medicine

## 2014-09-22 ENCOUNTER — Encounter: Payer: Self-pay | Admitting: Family Medicine

## 2014-09-22 NOTE — Telephone Encounter (Signed)
Pt has not been in office on over one year.  Letter sent to schedule appt.  One refill sent

## 2014-09-30 ENCOUNTER — Ambulatory Visit (INDEPENDENT_AMBULATORY_CARE_PROVIDER_SITE_OTHER): Payer: Self-pay | Admitting: Family Medicine

## 2014-09-30 ENCOUNTER — Encounter: Payer: Self-pay | Admitting: Family Medicine

## 2014-09-30 VITALS — BP 128/78 | HR 78 | Temp 98.9°F | Resp 18 | Ht 65.0 in | Wt 163.0 lb

## 2014-09-30 DIAGNOSIS — F319 Bipolar disorder, unspecified: Secondary | ICD-10-CM

## 2014-09-30 DIAGNOSIS — L0100 Impetigo, unspecified: Secondary | ICD-10-CM

## 2014-09-30 DIAGNOSIS — I471 Supraventricular tachycardia, unspecified: Secondary | ICD-10-CM

## 2014-09-30 DIAGNOSIS — Z13220 Encounter for screening for lipoid disorders: Secondary | ICD-10-CM

## 2014-09-30 DIAGNOSIS — F332 Major depressive disorder, recurrent severe without psychotic features: Secondary | ICD-10-CM

## 2014-09-30 MED ORDER — DOXYCYCLINE HYCLATE 100 MG PO TABS: 100.0000 mg | ORAL_TABLET | Freq: Two times a day (BID) | ORAL | Status: DC

## 2014-09-30 MED ORDER — PAROXETINE HCL 20 MG PO TABS: 20.0000 mg | ORAL_TABLET | Freq: Two times a day (BID) | ORAL | Status: DC

## 2014-09-30 MED ORDER — DIVALPROEX SODIUM 500 MG PO DR TAB: 500.0000 mg | DELAYED_RELEASE_TABLET | Freq: Two times a day (BID) | ORAL | Status: DC
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
## 2014-10-01 LAB — COMPLETE METABOLIC PANEL WITH GFR
ALBUMIN: 3.4 g/dL — AB (ref 3.5–5.2)
ALT: 10 U/L (ref 0–35)
AST: 15 U/L (ref 0–37)
Alkaline Phosphatase: 57 U/L (ref 39–117)
BUN: 8 mg/dL (ref 6–23)
CALCIUM: 9.3 mg/dL (ref 8.4–10.5)
CHLORIDE: 104 meq/L (ref 96–112)
CO2: 29 mEq/L (ref 19–32)
CREATININE: 0.64 mg/dL (ref 0.50–1.10)
GFR, Est African American: >89 mL/min
GLUCOSE: 103 mg/dL — AB (ref 70–99)
Potassium: 4.5 mEq/L (ref 3.5–5.3)
Sodium: 140 mEq/L (ref 135–145)
TOTAL PROTEIN: 6.4 g/dL (ref 6.0–8.3)
Total Bilirubin: 0.3 mg/dL (ref 0.2–1.2)

## 2014-10-01 LAB — LIPID PANEL
CHOL/HDL RATIO: 5.9 ratio
Cholesterol: 160 mg/dL (ref 0–200)
HDL: 27 mg/dL — ABNORMAL LOW (ref >=46)
LDL CALC: 95 mg/dL (ref 0–99)
Triglycerides: 191 mg/dL — ABNORMAL HIGH (ref <150)
VLDL: 38 mg/dL (ref 0–40)

## 2014-10-09 ENCOUNTER — Telehealth: Payer: Self-pay | Admitting: Family Medicine

## 2014-10-09 NOTE — Telephone Encounter (Signed)
Pt told about lab results from 4/27.  To increase aerobic exercise and start 2GR fish oil daily.

## 2014-10-14 ENCOUNTER — Ambulatory Visit: Payer: Self-pay | Admitting: Family Medicine

## 2014-10-15 ENCOUNTER — Encounter: Payer: Self-pay | Admitting: Family Medicine

## 2014-10-23 ENCOUNTER — Other Ambulatory Visit: Payer: Self-pay | Admitting: Family Medicine

## 2014-10-23 NOTE — Telephone Encounter (Signed)
Refill appropriate and filled per protocol. 

## 2015-03-16 ENCOUNTER — Other Ambulatory Visit: Payer: Self-pay | Admitting: Family Medicine

## 2015-03-16 NOTE — Telephone Encounter (Signed)
Refill appropriate and filled per protocol. 

## 2015-03-25 ENCOUNTER — Other Ambulatory Visit: Payer: Self-pay | Admitting: Family Medicine

## 2015-04-17 ENCOUNTER — Other Ambulatory Visit: Payer: Self-pay | Admitting: Family Medicine

## 2015-04-22 ENCOUNTER — Encounter: Payer: Self-pay | Admitting: Physician Assistant

## 2015-04-22 ENCOUNTER — Ambulatory Visit (INDEPENDENT_AMBULATORY_CARE_PROVIDER_SITE_OTHER): Payer: Self-pay | Admitting: Physician Assistant

## 2015-04-22 VITALS — BP 110/60 | HR 60 | Temp 98.8°F | Resp 18 | Wt 173.0 lb

## 2015-04-22 DIAGNOSIS — B350 Tinea barbae and tinea capitis: Secondary | ICD-10-CM

## 2015-04-22 LAB — HEPATIC FUNCTION PANEL
ALT: 10 U/L (ref 6–29)
AST: 23 U/L (ref 10–35)
Albumin: 3.9 g/dL (ref 3.6–5.1)
Alkaline Phosphatase: 54 U/L (ref 33–115)
BILIRUBIN INDIRECT: 0.4 mg/dL (ref 0.2–1.2)
Bilirubin, Direct: 0.1 mg/dL (ref <=0.2)
Total Bilirubin: 0.5 mg/dL (ref 0.2–1.2)
Total Protein: 6.5 g/dL (ref 6.1–8.1)

## 2015-04-22 MED ORDER — TERBINAFINE HCL 250 MG PO TABS: 250.0000 mg | ORAL_TABLET | Freq: Every day | ORAL | Status: DC

## 2015-04-22 NOTE — Progress Notes (Signed)
Patient ID: Adriana Hall MRN: [id number], DOB: 1970/11/10, 44 y.o. Date of Encounter: @DATE @  Chief Complaint:  Chief Complaint  Patient presents with  . OTHER    had break out since early march was put on antibx and not working, ringworm on back of head, and back all over body    HPI: 44 y.o. year old female  presents with above.  Reviewed Dr. Samella Parr note from 09/30/14. At that visit his note included what he felt to be "pickers nodules" (she has bipolar d/o) with secondary infection/impetigo. He treated with doxycycline at that time.  Pt states that back in March, she started having problems with the rash on her right forehead and right hairline/scalp. Says then she started having problems with the hair coming out at that area. Says Dr. Dennard Schaumann told her to take antibiotics and clean with Dial soap. Says this did not help and that the problem has gotten worse since. Says that she has since developed another area just a little bit further back on the right scalp. Also another area at the right posterior neck along the bottom of the hairline in the back.  Also some areas on her back and also her face and neck.  Says that the areas burn and are very itchy.  Says that she has used over-the-counter fungus cream, Neosporin, she applied proactive to the face thinking that it was something like acne. Also has used cortisone cream.   Past Medical History  Diagnosis Date  . Anxiety     10 years  . Arrhythmia     5 years  . Headache(784.0)     Chronic for 30 yrs  . Depression     10 years  . Hypertension   . Colitis 03-01-2011    Colonoscopy  . Hemorrhoids 03-01-2011    Colonoscopy   . Bipolar 1 disorder, depressed ([place])   . Bronchitis      Home Meds: Outpatient Prescriptions Prior to Visit  Medication Sig Dispense Refill  . atenolol (TENORMIN) 50 MG tablet TAKE ONE TABLET BY MOUTH ONCE DAILY IN THE EVENING FOR BLOOD PRESSURE 30 tablet 5  . divalproex (DEPAKOTE) 500 MG DR  tablet Take 1 tablet (500 mg total) by mouth 2 (two) times daily. 60 tablet 2  . fluticasone (FLONASE) 50 MCG/ACT nasal spray Place 1 spray into both nostrils daily as needed for allergies or rhinitis.  2  . gabapentin (NEURONTIN) 300 MG capsule Take 1 capsule (300 mg total) by mouth at bedtime. For substance withdrawal syndrome 30 capsule 0  . hyoscyamine (LEVBID) 0.375 MG 12 hr tablet Take 1 tablet (0.375 mg total) by mouth every 12 (twelve) hours as needed for cramping. 60 tablet 0  . ketotifen (ALLERGY EYE DROPS) 0.025 % ophthalmic solution Place 1 drop into both eyes 2 (two) times daily as needed (itchy eyes). 5 mL 0  . Magnesium 500 MG TABS Take 1 tablet (500 mg total) by mouth at bedtime. Constipation    . Omega-3 Fatty Acids (FISH OIL) 1000 MG CAPS Take 2 capsules by mouth daily.    Marland Kitchen omeprazole (PRILOSEC OTC) 20 MG tablet Take 2 tablets (40 mg total) by mouth at bedtime. For acid reflux    . PARoxetine (PAXIL) 20 MG tablet TAKE ONE TABLET BY MOUTH TWICE DAILY 60 tablet 2  . rOPINIRole (REQUIP) 0.25 MG tablet Take 1 tablet (0.25 mg total) by mouth at bedtime. For restless leg 30 tablet 0  . traZODone (DESYREL) 100 MG  tablet Take 2 tablets (200 mg total) by mouth at bedtime. For sleep 60 tablet 3  . doxycycline (VIBRA-TABS) 100 MG tablet Take 1 tablet (100 mg total) by mouth 2 (two) times daily. 20 tablet 0   No facility-administered medications prior to visit.    Allergies:  Allergies  Allergen Reactions  . Doxycycline Nausea And Vomiting    Social History   Social History  . Marital Status: Married    Spouse Name: N/A  . Number of Children: 3  . Years of Education: N/A   Occupational History  . Self employed    Social History Main Topics  . Smoking status: Current Every Day Smoker -- 3.00 packs/day for 13 years    Types: Cigarettes  . Smokeless tobacco: Never Used     Comment: information given on 03-01-11 and 09/10/13, has smoked 1.5 ppd for 20 years  . Alcohol Use: Yes       Comment: 3 times per year  . Drug Use: Yes    Special: Benzodiazepines, Other-see comments, Marijuana     Comment: oxycontin  . Sexual Activity: Not on file   Other Topics Concern  . 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 Pancreatic cancer Maternal Grandfather   . Liver cancer Maternal Grandfather   . Colon cancer Neg Hx   . Clotting disorder Mother   . Diabetes Paternal Grandmother     And PGF  . Heart disease Mother   . Other Father     Brain tumor     Review of Systems:  See HPI for pertinent ROS. All other ROS negative.    Physical Exam: Blood pressure 110/60, pulse 60, temperature 98.8 F (37.1 C), temperature source Oral, resp. rate 18, weight 173 lb (78.472 kg)., Body mass index is 28.79 kg/(m^2). General: Female.  Appears in no acute distress. Neck: Supple. No thyromegaly. No lymphadenopathy. Lungs: Clear bilaterally to auscultation without wheezes, rales, or rhonchi. Breathing is unlabored. Heart: RRR with S1 S2. No murmurs, rubs, or gallops. Musculoskeletal:  Strength and tone normal for age. Skin: Right Forehead/Scalp: There is one-inch diameter area of alopecia. There is scale.  Further back, on the right scalp, there is another area of alopecia with scale, slightly smaller diameter. At right posterior neck, at hairline at base of neck, there is an area of alopecia and scale that is a little larger than 1 inch diameter.  There is minimal "rash" on face, neck, back.  Neuro: Alert and oriented X 3. Moves all extremities spontaneously. Gait is normal. CNII-XII grossly in tact. Psych:  Responds to questions appropriately with a normal affect.     ASSESSMENT AND PLAN:  44 y.o. year  old female with  1. Tinea capitis LFTs were normal at last check but that was back 4/16. We'll recheck LFTs now to make sure that they are normal at baseline prior to her taking oral Lamisil. If LFTs normal then she is to talk start taking Lamisil and take daily for 8 weeks. She is to schedule follow-up office visit here in 8 weeks to reassess. If she feels  that his is worsening in the interim, then she is to follow-up sooner. Currently no significant secondary bacterial infection, so will not add antibiotic at this time. Told her to cont to clean with antibacterial soap.  - Hepatic function panel - terbinafine (LAMISIL) 250 MG tablet; Take 1 tablet (250 mg total) by mouth daily.  Dispense: 30 tablet; Refill: 1   Signed, [address] [place], [place], [facility] 04/22/2015 3:38 PM

## 2015-06-24 ENCOUNTER — Other Ambulatory Visit: Payer: Self-pay | Admitting: Family Medicine

## 2015-06-24 NOTE — Telephone Encounter (Signed)
Refill appropriate and filled per protocol. 

## 2015-06-29 ENCOUNTER — Ambulatory Visit: Payer: Self-pay | Admitting: Family Medicine

## 2015-07-28 ENCOUNTER — Encounter: Payer: Self-pay | Admitting: Family Medicine

## 2015-07-28 ENCOUNTER — Other Ambulatory Visit: Payer: Self-pay | Admitting: Family Medicine

## 2015-07-28 NOTE — Telephone Encounter (Signed)
Medication refill for one time only.  Patient needs to be seen.  Letter sent for patient to call and schedule 

## 2015-08-28 ENCOUNTER — Telehealth: Payer: Self-pay | Admitting: Family Medicine

## 2015-08-28 MED ORDER — PAROXETINE HCL 20 MG PO TABS: 20.0000 mg | ORAL_TABLET | Freq: Two times a day (BID) | ORAL | Status: DC

## 2015-08-28 NOTE — Telephone Encounter (Signed)
Medication called/sent to requested pharmacy  

## 2015-08-28 NOTE — Telephone Encounter (Signed)
CB# [phone] --patient has an appointment on Monday however needs this medication called in today she is taken her last one.  PARoxetine (PAXIL) 20 MG tablet  [facility] on [address]

## 2015-08-31 ENCOUNTER — Ambulatory Visit (INDEPENDENT_AMBULATORY_CARE_PROVIDER_SITE_OTHER): Payer: Self-pay | Admitting: Family Medicine

## 2015-08-31 ENCOUNTER — Encounter: Payer: Self-pay | Admitting: Family Medicine

## 2015-08-31 VITALS — BP 120/68 | HR 86 | Temp 98.3°F | Resp 16 | Ht 65.0 in | Wt 179.0 lb

## 2015-08-31 DIAGNOSIS — F3132 Bipolar disorder, current episode depressed, moderate: Secondary | ICD-10-CM

## 2015-08-31 DIAGNOSIS — L218 Other seborrheic dermatitis: Secondary | ICD-10-CM

## 2015-08-31 DIAGNOSIS — K0530 Chronic periodontitis, unspecified: Secondary | ICD-10-CM

## 2015-08-31 DIAGNOSIS — L219 Seborrheic dermatitis, unspecified: Secondary | ICD-10-CM

## 2015-08-31 DIAGNOSIS — K047 Periapical abscess without sinus: Secondary | ICD-10-CM

## 2015-08-31 LAB — COMPLETE METABOLIC PANEL WITH GFR
ALBUMIN: 3.7 g/dL (ref 3.6–5.1)
ALK PHOS: 44 U/L (ref 33–115)
ALT: 7 U/L (ref 6–29)
AST: 14 U/L (ref 10–35)
BILIRUBIN TOTAL: 0.2 mg/dL (ref 0.2–1.2)
BUN: 7 mg/dL (ref 7–25)
CO2: 26 mmol/L (ref 20–31)
CREATININE: 0.75 mg/dL (ref 0.50–1.10)
Calcium: 8.7 mg/dL (ref 8.6–10.2)
Chloride: 102 mmol/L (ref 98–110)
GFR, Est Non African American: >89 mL/min (ref >=60)
GLUCOSE: 70 mg/dL (ref 70–99)
Potassium: 4 mmol/L (ref 3.5–5.3)
Sodium: 139 mmol/L (ref 135–146)
TOTAL PROTEIN: 6 g/dL — AB (ref 6.1–8.1)

## 2015-08-31 MED ORDER — AMOXICILLIN 875 MG PO TABS: 875.0000 mg | ORAL_TABLET | Freq: Two times a day (BID) | ORAL | Status: DC

## 2015-08-31 MED ORDER — DIVALPROEX SODIUM 500 MG PO DR TAB: DELAYED_RELEASE_TABLET | ORAL | Status: DC
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
## 2015-09-01 ENCOUNTER — Encounter: Payer: Self-pay | Admitting: Family Medicine

## 2015-09-01 ENCOUNTER — Telehealth: Payer: Self-pay | Admitting: *Deleted

## 2015-09-01 NOTE — Telephone Encounter (Signed)
pt has appt scheduled for Tues April 18 at 9:50am with Dr. Harriett Sine at [facility], lmtrc to pt for appt information

## 2015-09-02 ENCOUNTER — Encounter: Payer: Self-pay | Admitting: *Deleted

## 2015-09-02 NOTE — Telephone Encounter (Signed)
Tried calling pt no answer, sent pt letter with appt information

## 2015-09-23 ENCOUNTER — Ambulatory Visit: Payer: Self-pay | Admitting: Physician Assistant

## 2015-09-23 ENCOUNTER — Other Ambulatory Visit: Payer: Self-pay | Admitting: Family Medicine

## 2015-09-23 NOTE — Telephone Encounter (Signed)
Refill appropriate and filled per protocol. 

## 2015-09-29 ENCOUNTER — Encounter: Payer: Self-pay | Admitting: Family Medicine

## 2015-10-06 ENCOUNTER — Emergency Department (HOSPITAL_COMMUNITY)
Admission: EM | Admit: 2015-10-06 | Discharge: 2015-10-06 | Disposition: A | Discharge status: Home / Self Care | Payer: Self-pay | Attending: Emergency Medicine | Admitting: Emergency Medicine

## 2015-10-06 ENCOUNTER — Encounter (HOSPITAL_COMMUNITY): Payer: Self-pay | Admitting: Emergency Medicine

## 2015-10-06 DIAGNOSIS — I10 Essential (primary) hypertension: Secondary | ICD-10-CM | POA: Insufficient documentation

## 2015-10-06 DIAGNOSIS — F17210 Nicotine dependence, cigarettes, uncomplicated: Secondary | ICD-10-CM | POA: Insufficient documentation

## 2015-10-06 DIAGNOSIS — K047 Periapical abscess without sinus: Secondary | ICD-10-CM

## 2015-10-06 DIAGNOSIS — K0889 Other specified disorders of teeth and supporting structures: Secondary | ICD-10-CM | POA: Insufficient documentation

## 2015-10-06 DIAGNOSIS — F329 Major depressive disorder, single episode, unspecified: Secondary | ICD-10-CM | POA: Insufficient documentation

## 2015-10-06 DIAGNOSIS — Z792 Long term (current) use of antibiotics: Secondary | ICD-10-CM | POA: Insufficient documentation

## 2015-10-06 DIAGNOSIS — Z79899 Other long term (current) drug therapy: Secondary | ICD-10-CM | POA: Insufficient documentation

## 2015-10-06 DIAGNOSIS — Z7952 Long term (current) use of systemic steroids: Secondary | ICD-10-CM | POA: Insufficient documentation

## 2015-10-06 MED ORDER — LORAZEPAM 1 MG PO TABS
1.0000 mg | ORAL_TABLET | Freq: Once | ORAL | Status: AC
  Administered 2015-10-06: 1 mg via ORAL
  Filled 2015-10-06: qty 1

## 2015-10-06 MED ORDER — AMOXICILLIN 500 MG PO CAPS
500.0000 mg | ORAL_CAPSULE | Freq: Once | ORAL | Status: AC
Start: 2015-10-06 — End: 2015-10-06
  Administered 2015-10-06: 500 mg via ORAL
  Filled 2015-10-06: qty 1

## 2015-10-06 MED ORDER — IBUPROFEN 200 MG PO TABS
400.0000 mg | ORAL_TABLET | Freq: Once | ORAL | Status: AC
  Administered 2015-10-06: 400 mg via ORAL
  Filled 2015-10-06: qty 2

## 2015-10-06 MED ORDER — HYDROCODONE-ACETAMINOPHEN 5-325 MG PO TABS: 1.0000 | ORAL_TABLET | Freq: Four times a day (QID) | ORAL | Status: DC | PRN

## 2015-10-06 MED ORDER — AMOXICILLIN 500 MG PO CAPS: 500.0000 mg | ORAL_CAPSULE | Freq: Three times a day (TID) | ORAL | Status: DC
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
## 2015-10-06 NOTE — ED Notes (Signed)
Dental pain x 2 weeks. Has seen her PCP who gave her antibiotics until her dentist appointment next week. States she's finished the antibiotics but is having continued pain. Crying in triage. Says she's unable to open mouth in triage d/t pain for me to examine

## 2015-10-06 NOTE — Discharge Instructions (Signed)
It was our pleasure to provide your ER care today - we hope that you feel better.  Take antibiotic (amoxicillin) as prescribed. Take motrin or aleve as need for pain.   You may also take hydrocodone as need for pain. No driving for the next 6 hours or when taking hydrocodone. Also, do not take tylenol or acetaminophen containing medication when taking hydrocodone.   Follow up with dentist in the next few days as planned.    Return to ER if worse, facial/neck swelling, high fevers, intractable pain, trouble breathing or swallowing, other concern.      Dental Abscess A dental abscess is a collection of pus in or around a tooth. CAUSES This condition is caused by a bacterial infection around the root of the tooth that involves the inner part of the tooth (pulp). It may result from:  Severe tooth decay.  Trauma to the tooth that allows bacteria to enter into the pulp, such as a broken or chipped tooth.  Severe gum disease around a tooth. SYMPTOMS Symptoms of this condition include:  Severe pain in and around the infected tooth.  Swelling and redness around the infected tooth, in the mouth, or in the face.  Tenderness.  Pus drainage.  Bad breath.  Bitter taste in the mouth.  Difficulty swallowing.  Difficulty opening the mouth.  Nausea.  Vomiting.  Chills.  Swollen neck glands.  Fever. DIAGNOSIS This condition is diagnosed with examination of the infected tooth. During the exam, your dentist may tap on the infected tooth. Your dentist will also ask about your medical and dental history and may order X-rays. TREATMENT This condition is treated by eliminating the infection. This may be done with:  Antibiotic medicine.  A root canal. This may be performed to save the tooth.  Pulling (extracting) the tooth. This may also involve draining the abscess. This is done if the tooth cannot be saved. HOME CARE INSTRUCTIONS  Take medicines only as directed by your  dentist.  If you were prescribed antibiotic medicine, finish all of it even if you start to feel better.  Rinse your mouth (gargle) often with salt water to relieve pain or swelling.  Do not drive or operate heavy machinery while taking pain medicine.  Do not apply heat to the outside of your mouth.  Keep all follow-up visits as directed by your dentist. This is important. SEEK MEDICAL CARE IF:  Your pain is worse and is not helped by medicine. SEEK IMMEDIATE MEDICAL CARE IF:  You have a fever or chills.  Your symptoms suddenly get worse.  You have a very bad headache.  You have problems breathing or swallowing.  You have trouble opening your mouth.  You have swelling in your neck or around your eye.   This information is not intended to replace advice given to you by your health care provider. Make sure you discuss any questions you have with your health care provider.   Document Released: 05/23/2005 Document Revised: 10/07/2014 Document Reviewed: 05/20/2014 Elsevier Interactive Patient Education 2016 [place] Pain Dental pain may be caused by many things, including:  Tooth decay (cavities or caries). Cavities expose the nerve of your tooth to air and hot or cold temperatures. This can cause pain or discomfort.  Abscess or infection. A dental abscess is a collection of infected pus from a bacterial infection in the inner part of the tooth (pulp). It usually occurs at the end of the tooth's root.  Injury.  An unknown reason (idiopathic). Your pain may be mild or severe. It may only occur when:  You are chewing.  You are exposed to hot or cold temperature.  You are eating or drinking sugary foods or beverages, such as soda or candy. Your pain may also be constant. HOME CARE INSTRUCTIONS Watch your dental pain for any changes. The following actions may help to lessen any discomfort that you are feeling:  Take medicines only as directed by your  dentist.  If you were prescribed an antibiotic medicine, finish all of it even if you start to feel better.  Keep all follow-up visits as directed by your dentist. This is important.  Do not apply heat to the outside of your face.  Rinse your mouth or gargle with salt water if directed by your dentist. This helps with pain and swelling.  You can make salt water by adding  tsp of salt to 1 cup of warm water.  Apply ice to the painful area of your face:  Put ice in a plastic bag.  Place a towel between your skin and the bag.  Leave the ice on for 20 minutes, 2-3 times per day.  Avoid foods or drinks that cause you pain, such as:  Very hot or very cold foods or drinks.  Sweet or sugary foods or drinks. SEEK MEDICAL CARE IF:  Your pain is not controlled with medicines.  Your symptoms are worse.  You have new symptoms. SEEK IMMEDIATE MEDICAL CARE IF:  You are unable to open your mouth.  You are having trouble breathing or swallowing.  You have a fever.  Your face, neck, or jaw is swollen.   This information is not intended to replace advice given to you by your health care provider. Make sure you discuss any questions you have with your health care provider.   Document Released: 05/23/2005 Document Revised: 10/07/2014 Document Reviewed: 05/19/2014 Elsevier Interactive Patient Education [employer].

## 2015-11-05 ENCOUNTER — Other Ambulatory Visit: Payer: Self-pay | Admitting: Family Medicine

## 2015-11-06 NOTE — Telephone Encounter (Signed)
Refill appropriate and filled per protocol. 

## 2015-12-08 ENCOUNTER — Other Ambulatory Visit: Payer: Self-pay | Admitting: Family Medicine

## 2015-12-21 ENCOUNTER — Encounter: Payer: Self-pay | Admitting: Physician Assistant

## 2015-12-21 ENCOUNTER — Ambulatory Visit (INDEPENDENT_AMBULATORY_CARE_PROVIDER_SITE_OTHER): Payer: Self-pay | Admitting: Physician Assistant

## 2015-12-21 VITALS — BP 134/84 | Temp 98.4°F | Wt 183.0 lb

## 2015-12-21 DIAGNOSIS — K0889 Other specified disorders of teeth and supporting structures: Secondary | ICD-10-CM

## 2015-12-21 MED ORDER — AMOXICILLIN 875 MG PO TABS: 875.0000 mg | ORAL_TABLET | Freq: Two times a day (BID) | ORAL | Status: DC

## 2015-12-21 NOTE — Progress Notes (Signed)
Patient ID: Adriana Hall MRN: [id number], DOB: 1970-06-29, 45 y.o. Date of Encounter: 12/21/2015, 12:32 PM    Chief Complaint: Tooth Pain   HPI: 45 y.o. year old white female presents with above. Reviewed her ER visit note from 10/06/15. At that visit they gave Motrin and amoxicillin and she reported she had an appointment with the dentist the next week.  Also reviewed that her chart documents history of opioid dependence.  Today she reports to me that she is "in the process of getting teeth removed to get dentures ". Says she "goes to the place on [address]". Says that you do not have to make an appointment. Says that she got 2 on the top left out recently. Now is having problems with the top right. Says that she is supposed to be going this Thursday to get more teeth removed. Says that she goes every month and gets some teeth removed. Says that she is now having pain in the top right. Says that she has pushed on the area and gotten pus out. Says that she is taking 4 ibuprofen and also multiple Tylenol.     Home Meds:   Outpatient Prescriptions Prior to Visit  Medication Sig Dispense Refill  . atenolol (TENORMIN) 50 MG tablet TAKE ONE TABLET BY MOUTH ONCE DAILY IN THE EVENING FOR BLOOD PRESSURE 30 tablet 6  . fluticasone (FLONASE) 50 MCG/ACT nasal spray Place 1 spray into both nostrils daily as needed for allergies or rhinitis.  2  . gabapentin (NEURONTIN) 300 MG capsule Take 1 capsule (300 mg total) by mouth at bedtime. For substance withdrawal syndrome 30 capsule 0  . Omega-3 Fatty Acids (FISH OIL) 1000 MG CAPS Take 2 capsules by mouth daily.    Marland Kitchen omeprazole (PRILOSEC OTC) 20 MG tablet Take 2 tablets (40 mg total) by mouth at bedtime. For acid reflux (Patient taking differently: Take 20 mg by mouth daily. For acid reflux)    . PARoxetine (PAXIL) 20 MG tablet TAKE ONE TABLET BY MOUTH TWICE DAILY 60 tablet 3  . rOPINIRole (REQUIP) 0.25 MG tablet Take 1 tablet (0.25 mg total) by mouth at  bedtime. For restless leg (Patient taking differently: Take 0.25 mg by mouth at bedtime as needed (restless leg.). For restless leg) 30 tablet 0  . traZODone (DESYREL) 100 MG tablet Take 2 tablets (200 mg total) by mouth at bedtime. For sleep 60 tablet 3  . amoxicillin (AMOXIL) 500 MG capsule Take 1 capsule (500 mg total) by mouth 3 (three) times daily. 21 capsule 0  . amoxicillin (AMOXIL) 875 MG tablet Take 1 tablet (875 mg total) by mouth 2 (two) times daily. (Patient not taking: Reported on 10/06/2015) 20 tablet 0  . divalproex (DEPAKOTE) 500 MG DR tablet Take 1 tablet (500 mg total) by mouth 2 (two) times daily. (Patient not taking: Reported on 10/06/2015) 60 tablet 2  . divalproex (DEPAKOTE) 500 MG DR tablet 1 1/2 tabs pobid (Patient not taking: Reported on 10/06/2015) 90 tablet 3  . HYDROcodone-acetaminophen (NORCO/VICODIN) 5-325 MG tablet Take 1-2 tablets by mouth every 6 (six) hours as needed for moderate pain. 20 tablet 0  . hyoscyamine (LEVBID) 0.375 MG 12 hr tablet Take 1 tablet (0.375 mg total) by mouth every 12 (twelve) hours as needed for cramping. (Patient not taking: Reported on 10/06/2015) 60 tablet 0  . ketotifen (ALLERGY EYE DROPS) 0.025 % ophthalmic solution Place 1 drop into both eyes 2 (two) times daily as needed (itchy eyes). (Patient not taking:  Reported on 10/06/2015) 5 mL 0  . Magnesium 500 MG TABS Take 1 tablet (500 mg total) by mouth at bedtime. Constipation (Patient not taking: Reported on 10/06/2015)     No facility-administered medications prior to visit.    Allergies:  Allergies  Allergen Reactions  . Doxycycline Nausea And Vomiting      Review of Systems: See HPI for pertinent ROS. All other ROS negative.    Physical Exam: Blood pressure 134/84, temperature 98.4 F (36.9 C), weight 183 lb (83.008 kg)., Body mass index is 30.45 kg/(m^2). General:  WNWD WF. Appears in no acute distress. HEENT: majority of teeth are absent. Remaining few teeth are decayed, broken off.  Right upper remaining stubs of decayed teeth with associated gum swelling, erythema, and tenderness. Neck: Supple. No thyromegaly. No lymphadenopathy. Lungs: Clear bilaterally to auscultation without wheezes, rales, or rhonchi. Breathing is unlabored. Heart: Regular rhythm. No murmurs, rubs, or gallops. Msk:  Strength and tone normal for age. Extremities/Skin: Warm and dry. Neuro: Alert and oriented X 3. Moves all extremities spontaneously. Gait is normal. CNII-XII grossly in tact. Psych:  Responds to questions appropriately with a normal affect.     ASSESSMENT AND PLAN:  45 y.o. year old female with  1. Tooth pain She is to start the amoxicillin immediately and take as directed. Follow-up with tooth extraction. She did not ask for any pain medication. Cont otc tylenol, motrin. - amoxicillin (AMOXIL) 875 MG tablet; Take 1 tablet (875 mg total) by mouth 2 (two) times daily.  Dispense: 20 tablet; Refill: 0   Signed, [address] [place], [place], [facility] 12/21/2015 12:32 PM

## 2016-01-22 ENCOUNTER — Other Ambulatory Visit: Payer: Self-pay | Admitting: Family Medicine

## 2016-06-23 ENCOUNTER — Ambulatory Visit: Payer: Self-pay | Admitting: Family Medicine

## 2016-07-05 ENCOUNTER — Ambulatory Visit: Payer: Self-pay | Admitting: Family Medicine

## 2016-07-28 ENCOUNTER — Other Ambulatory Visit: Payer: Self-pay | Admitting: Family Medicine

## 2016-08-10 ENCOUNTER — Other Ambulatory Visit: Payer: Self-pay | Admitting: Obstetrics and Gynecology

## 2016-08-10 DIAGNOSIS — Z1231 Encounter for screening mammogram for malignant neoplasm of breast: Secondary | ICD-10-CM

## 2016-08-25 ENCOUNTER — Ambulatory Visit (HOSPITAL_COMMUNITY)
Admission: RE | Admit: 2016-08-25 | Discharge: 2016-08-25 | Disposition: A | Discharge status: Home / Self Care | Payer: Self-pay | Source: Ambulatory Visit | Attending: Obstetrics and Gynecology | Admitting: Obstetrics and Gynecology

## 2016-08-25 ENCOUNTER — Ambulatory Visit: Payer: Self-pay

## 2017-02-11 ENCOUNTER — Encounter (HOSPITAL_COMMUNITY): Payer: Self-pay | Admitting: *Deleted

## 2017-02-11 ENCOUNTER — Ambulatory Visit (HOSPITAL_COMMUNITY)
Admission: EM | Admit: 2017-02-11 | Discharge: 2017-02-11 | Disposition: A | Discharge status: Home / Self Care | Payer: Self-pay | Attending: Family Medicine | Admitting: Family Medicine

## 2017-02-11 DIAGNOSIS — K0889 Other specified disorders of teeth and supporting structures: Secondary | ICD-10-CM

## 2017-02-11 MED ORDER — HYDROCODONE-ACETAMINOPHEN 5-325 MG PO TABS: 1.0000 | ORAL_TABLET | Freq: Four times a day (QID) | ORAL | 0 refills | Status: DC | PRN

## 2017-02-11 MED ORDER — AMOXICILLIN-POT CLAVULANATE 875-125 MG PO TABS: 1.0000 | ORAL_TABLET | Freq: Two times a day (BID) | ORAL | 0 refills | Status: DC

## 2017-02-11 NOTE — ED Triage Notes (Signed)
Has been taking Tyl, IBU, BC Powder, ASA.

## 2017-02-11 NOTE — ED Triage Notes (Signed)
C/O right lower dental pain x 3 wks.  Pain increased significantly over past 2 days.  States fevers up to 103 with nausea.  Unable to get an appt with PCP.
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
## 2017-03-21 ENCOUNTER — Other Ambulatory Visit: Payer: Self-pay | Admitting: Family Medicine

## 2017-03-30 ENCOUNTER — Other Ambulatory Visit: Payer: Self-pay | Admitting: Family Medicine

## 2017-08-10 ENCOUNTER — Encounter: Payer: Self-pay | Admitting: Physician Assistant

## 2017-08-10 ENCOUNTER — Other Ambulatory Visit: Payer: Self-pay

## 2017-08-10 ENCOUNTER — Ambulatory Visit: Payer: Self-pay | Admitting: Physician Assistant

## 2017-08-10 VITALS — BP 134/74 | HR 74 | Temp 98.6°F | Resp 18 | Ht 65.0 in | Wt 171.0 lb

## 2017-08-10 DIAGNOSIS — F1310 Sedative, hypnotic or anxiolytic abuse, uncomplicated: Secondary | ICD-10-CM

## 2017-08-10 DIAGNOSIS — F1390 Sedative, hypnotic, or anxiolytic use, unspecified, uncomplicated: Secondary | ICD-10-CM

## 2017-08-10 NOTE — Progress Notes (Signed)
Patient ID: Adriana Hall MRN: [id number], DOB: May 01, 1971, 47 y.o. Date of Encounter: 08/10/2017, 3:09 PM    Chief Complaint:  Chief Complaint  Patient presents with  . Medication Withdrawal    x2 days off Xanax- feeling dizzy, jittery, fatigued     HPI: 47 y.o. year old female presents with above.   She reports that she has been going to [facility] for 1-1/2 years.  Says that  "they recently changed my medicines".  Says that she "would not leave her house for 6 months."  Says that she "was having panic attacks even to try to go to the store".  Says that she missed the appointment at [facility] last month because she was sick with the flu.  States that they will not refill her Xanax.  Says that she "has called and called but everything is automated so she cannot talk to a human being about getting a refill on her Xanax."  Says that she is feeling jittery and panicky.  She is wanting me to fill her Xanax for her to use until she follows up with [facility].  I asked if she is taking the other medications that they prescribed.  Asked if they have been giving refills on other medicines.  States that "yes they gave refills on the other medicines but do not give refills on the Xanax because it is considered a controlled medication." Says that she does have the other medications and is taking as directed the following: Paxil, gabapentin, trazodone.     Home Meds:   Outpatient Medications Prior to Visit  Medication Sig Dispense Refill  . atenolol (TENORMIN) 50 MG tablet TAKE 1 TABLET BY MOUTH ONCE DAILY IN THE EVENING FOR BLOOD PRESSURE 30 tablet 6  . gabapentin (NEURONTIN) 300 MG capsule Take 300 mg by mouth 3 (three) times daily.    Marland Kitchen omeprazole (PRILOSEC OTC) 20 MG tablet Take 2 tablets (40 mg total) by mouth at bedtime. For acid reflux (Patient taking differently: Take 20 mg by mouth daily. For acid reflux)    . PARoxetine (PAXIL) 20 MG tablet TAKE 1 TABLET BY MOUTH TWICE DAILY 60 tablet 5  .  traZODone (DESYREL) 100 MG tablet Take 2 tablets (200 mg total) by mouth at bedtime. For sleep 60 tablet 3  . ALPRAZolam (XANAX) 0.5 MG tablet Take 0.5 mg by mouth 4 (four) times daily as needed for anxiety.    Marland Kitchen amoxicillin (AMOXIL) 875 MG tablet Take 1 tablet (875 mg total) by mouth 2 (two) times daily. (Patient not taking: Reported on 08/10/2017) 20 tablet 0  . amoxicillin-clavulanate (AUGMENTIN) 875-125 MG tablet Take 1 tablet by mouth every 12 (twelve) hours. (Patient not taking: Reported on 08/10/2017) 20 tablet 0  . fluticasone (FLONASE) 50 MCG/ACT nasal spray Place 1 spray into both nostrils daily as needed for allergies or rhinitis. (Patient not taking: Reported on 08/10/2017)  2  . gabapentin (NEURONTIN) 300 MG capsule Take 1 capsule (300 mg total) by mouth at bedtime. For substance withdrawal syndrome (Patient not taking: Reported on 08/10/2017) 30 capsule 0  . HYDROcodone-acetaminophen (NORCO/VICODIN) 5-325 MG tablet Take 1 tablet by mouth every 6 (six) hours as needed for moderate pain or severe pain. (Patient not taking: Reported on 08/10/2017) 15 tablet 0  . Omega-3 Fatty Acids (FISH OIL) 1000 MG CAPS Take 2 capsules by mouth daily.    Marland Kitchen rOPINIRole (REQUIP) 0.25 MG tablet Take 1 tablet (0.25 mg total) by mouth at bedtime. For restless leg (Patient  not taking: Reported on 08/10/2017) 30 tablet 0   No facility-administered medications prior to visit.     Allergies:  Allergies  Allergen Reactions  . Doxycycline Nausea And Vomiting      Review of Systems: See HPI for pertinent ROS. All other ROS negative.    Physical Exam: Blood pressure 134/74, pulse 74, temperature 98.6 F (37 C), temperature source Oral, resp. rate 18, height 5\' 5"  (1.651 m), weight 77.6 kg (171 lb), SpO2 98 %., Body mass index is 28.46 kg/m. General:  WF. Obvious, visible poor dentitionAppears in no acute distress. Neck: Supple. No thyromegaly. No lymphadenopathy. Lungs: Clear bilaterally to auscultation without  wheezes, rales, or rhonchi. Breathing is unlabored. Heart: Regular rhythm. No murmurs, rubs, or gallops. Msk:  Strength and tone normal for age. Extremities/Skin: Warm and dry.  Neuro: Alert and oriented X 3. Moves all extremities spontaneously. Gait is normal. CNII-XII grossly in tact. Psych:  She appears somewhat anxious but overall has stable mood, affect     ASSESSMENT AND PLAN:  47 y.o. year old female with  1. Benzodiazepine misuse ([place]) I had her drug Registry printed.   [place] office staff informed me that her last name has recently changed so they printed drug registry for patient Kolette Vey and [employer]. Registry for each of these names includes no Xanax.   Includes no benzodiazepines at all.   The only medication on the drug registry for Janus Vlcek is one prescription for hydrocodone quantity 20 prescribed by ER Rosalie Doctor on 10/06/2015. The only medication on the drug registry for [facility] [place] is hydrocodone for quantity #15 fill date 02/11/2017 and that was prescribed by Vanessa Kick at [facility] consistent with the ER also. I reviewed this information with her and discussed that I cannot prescribe this medication.  She states that she will follow-up with [facility].  Marin Olp [place], [place], [facility] 08/10/2017 3:09 PM

## 2017-10-13 ENCOUNTER — Other Ambulatory Visit: Payer: Self-pay | Admitting: Family Medicine

## 2017-11-06 ENCOUNTER — Other Ambulatory Visit: Payer: Self-pay | Admitting: Family Medicine

## 2018-04-08 ENCOUNTER — Other Ambulatory Visit: Payer: Self-pay | Admitting: Physician Assistant

## 2018-04-08 ENCOUNTER — Other Ambulatory Visit: Payer: Self-pay | Admitting: Family Medicine

## 2018-04-09 MED ORDER — ATENOLOL 50 MG PO TABS: ORAL_TABLET | ORAL | 1 refills | Status: DC

## 2018-04-09 NOTE — Addendum Note (Signed)
Addended by: Sheral Flow on: 04/09/2018 08:57 AM   Modules accepted: Orders

## 2019-01-09 ENCOUNTER — Other Ambulatory Visit: Payer: Self-pay | Admitting: Family Medicine

## 2019-02-05 ENCOUNTER — Other Ambulatory Visit: Payer: Self-pay | Admitting: Family Medicine

## 2019-03-14 ENCOUNTER — Other Ambulatory Visit: Payer: Self-pay

## 2019-03-14 MED ORDER — PAROXETINE HCL 20 MG PO TABS: 20.0000 mg | ORAL_TABLET | Freq: Two times a day (BID) | ORAL | 0 refills | Status: DC

## 2019-03-28 ENCOUNTER — Ambulatory Visit: Payer: Self-pay | Admitting: Family Medicine

## 2019-04-01 ENCOUNTER — Encounter: Payer: Self-pay | Admitting: Family Medicine

## 2019-07-17 ENCOUNTER — Other Ambulatory Visit: Payer: Self-pay

## 2019-07-17 ENCOUNTER — Encounter (HOSPITAL_COMMUNITY): Payer: Self-pay | Admitting: Emergency Medicine

## 2019-07-17 ENCOUNTER — Ambulatory Visit (HOSPITAL_COMMUNITY)
Admission: EM | Admit: 2019-07-17 | Discharge: 2019-07-17 | Disposition: A | Discharge status: Home / Self Care | Payer: Self-pay | Attending: Family Medicine | Admitting: Family Medicine

## 2019-07-17 ENCOUNTER — Ambulatory Visit (INDEPENDENT_AMBULATORY_CARE_PROVIDER_SITE_OTHER): Payer: Self-pay

## 2019-07-17 DIAGNOSIS — R0602 Shortness of breath: Secondary | ICD-10-CM

## 2019-07-17 MED ORDER — PREDNISONE 50 MG PO TABS: ORAL_TABLET | ORAL | 0 refills | Status: DC

## 2019-07-17 NOTE — ED Triage Notes (Signed)
Last saw doctor 3 years ago. PT has been off of her atenolol for 1.5 months. History of WPW. Used to see Dr. Cleda Mccreedy.   Shortness of breath, productive cough, pain in right chest for 1 year, has worsened. Family history of SCLC x2. PT reports sensation of fluid on lungs, when she lays down she feels like she's drowning. Exertional SOB. No appetite.   Sputum is dark brown with green.

## 2019-07-17 NOTE — Discharge Instructions (Signed)
Please monitor your symptoms and be seen in the emergency department if they worsen  We will help with establishing care

## 2019-07-17 NOTE — ED Triage Notes (Signed)
Blood in sputum yesterday
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
## 2019-12-25 ENCOUNTER — Emergency Department (HOSPITAL_COMMUNITY)
Admission: EM | Admit: 2019-12-25 | Discharge: 2019-12-26 | Disposition: A | Discharge status: Other Acute Inpatient Hospital | Payer: Self-pay | Attending: Emergency Medicine | Admitting: Emergency Medicine

## 2019-12-25 ENCOUNTER — Other Ambulatory Visit: Payer: Self-pay

## 2019-12-25 ENCOUNTER — Encounter (HOSPITAL_COMMUNITY): Payer: Self-pay

## 2019-12-25 DIAGNOSIS — F329 Major depressive disorder, single episode, unspecified: Secondary | ICD-10-CM | POA: Insufficient documentation

## 2019-12-25 DIAGNOSIS — Z20822 Contact with and (suspected) exposure to covid-19: Secondary | ICD-10-CM | POA: Insufficient documentation

## 2019-12-25 DIAGNOSIS — I10 Essential (primary) hypertension: Secondary | ICD-10-CM | POA: Insufficient documentation

## 2019-12-25 DIAGNOSIS — T50902A Poisoning by unspecified drugs, medicaments and biological substances, intentional self-harm, initial encounter: Secondary | ICD-10-CM

## 2019-12-25 DIAGNOSIS — F32A Depression, unspecified: Secondary | ICD-10-CM

## 2019-12-25 DIAGNOSIS — T424X2A Poisoning by benzodiazepines, intentional self-harm, initial encounter: Secondary | ICD-10-CM | POA: Insufficient documentation

## 2019-12-25 DIAGNOSIS — I2510 Atherosclerotic heart disease of native coronary artery without angina pectoris: Secondary | ICD-10-CM | POA: Insufficient documentation

## 2019-12-25 DIAGNOSIS — F1020 Alcohol dependence, uncomplicated: Secondary | ICD-10-CM | POA: Diagnosis present

## 2019-12-25 DIAGNOSIS — T1491XA Suicide attempt, initial encounter: Secondary | ICD-10-CM

## 2019-12-25 DIAGNOSIS — F17210 Nicotine dependence, cigarettes, uncomplicated: Secondary | ICD-10-CM | POA: Insufficient documentation

## 2019-12-25 DIAGNOSIS — F4310 Post-traumatic stress disorder, unspecified: Secondary | ICD-10-CM | POA: Diagnosis present

## 2019-12-25 LAB — CBC WITH DIFFERENTIAL/PLATELET
Abs Immature Granulocytes: 0.1 10*3/uL — ABNORMAL HIGH (ref 0.00–0.07)
Basophils Absolute: 0.1 10*3/uL (ref 0.0–0.1)
Basophils Relative: 1 %
Eosinophils Absolute: 0.3 10*3/uL (ref 0.0–0.5)
Eosinophils Relative: 3 %
HCT: 38.5 % (ref 36.0–46.0)
Hemoglobin: 11.1 g/dL — ABNORMAL LOW (ref 12.0–15.0)
Immature Granulocytes: 1 %
Lymphocytes Relative: 24 %
Lymphs Abs: 2.6 10*3/uL (ref 0.7–4.0)
MCH: 21.8 pg — ABNORMAL LOW (ref 26.0–34.0)
MCHC: 28.8 g/dL — ABNORMAL LOW (ref 30.0–36.0)
MCV: 75.5 fL — ABNORMAL LOW (ref 80.0–100.0)
Monocytes Absolute: 1.2 10*3/uL — ABNORMAL HIGH (ref 0.1–1.0)
Monocytes Relative: 11 %
Neutro Abs: 6.4 10*3/uL (ref 1.7–7.7)
Neutrophils Relative %: 60 %
Platelets: 310 10*3/uL (ref 150–400)
RBC: 5.1 MIL/uL (ref 3.87–5.11)
RDW: 21.4 % — ABNORMAL HIGH (ref 11.5–15.5)
WBC: 10.6 10*3/uL — ABNORMAL HIGH (ref 4.0–10.5)
nRBC: 0 % (ref 0.0–0.2)

## 2019-12-25 LAB — COMPREHENSIVE METABOLIC PANEL
ALT: 25 U/L (ref 0–44)
AST: 28 U/L (ref 15–41)
Albumin: 3.9 g/dL (ref 3.5–5.0)
Alkaline Phosphatase: 55 U/L (ref 38–126)
Anion gap: 13 (ref 5–15)
BUN: <5 mg/dL — ABNORMAL LOW (ref 6–20)
CO2: 22 mmol/L (ref 22–32)
Calcium: 9 mg/dL (ref 8.9–10.3)
Chloride: 106 mmol/L (ref 98–111)
Creatinine, Ser: 0.6 mg/dL (ref 0.44–1.00)
GFR calc Af Amer: >60 mL/min (ref >60)
GFR calc non Af Amer: >60 mL/min (ref >60)
Glucose, Bld: 116 mg/dL — ABNORMAL HIGH (ref 70–99)
Potassium: 3.6 mmol/L (ref 3.5–5.1)
Sodium: 141 mmol/L (ref 135–145)
Total Bilirubin: 0.5 mg/dL (ref 0.3–1.2)
Total Protein: 7.4 g/dL (ref 6.5–8.1)

## 2019-12-25 LAB — RAPID URINE DRUG SCREEN, HOSP PERFORMED
Amphetamines: NOT DETECTED
Barbiturates: NOT DETECTED
Benzodiazepines: POSITIVE — AB
Cocaine: NOT DETECTED
Opiates: NOT DETECTED
Tetrahydrocannabinol: NOT DETECTED

## 2019-12-25 LAB — ETHANOL: Alcohol, Ethyl (B): 33 mg/dL — ABNORMAL HIGH (ref <10)

## 2019-12-25 LAB — HCG, QUANTITATIVE, PREGNANCY: hCG, Beta Chain, Quant, S: 3 m[IU]/mL (ref <5)

## 2019-12-25 LAB — SALICYLATE LEVEL: Salicylate Lvl: <7 mg/dL — ABNORMAL LOW (ref 7.0–30.0)

## 2019-12-25 LAB — SARS CORONAVIRUS 2 BY RT PCR (HOSPITAL ORDER, PERFORMED IN ~~LOC~~ HOSPITAL LAB): SARS Coronavirus 2: NEGATIVE

## 2019-12-25 LAB — ACETAMINOPHEN LEVEL: Acetaminophen (Tylenol), Serum: <10 ug/mL — ABNORMAL LOW (ref 10–30)

## 2019-12-25 MED ORDER — ATENOLOL 50 MG PO TABS: 50.0000 mg | ORAL_TABLET | Freq: Every day | ORAL | Status: DC

## 2019-12-25 MED ORDER — LORAZEPAM 2 MG/ML IJ SOLN
1.0000 mg | Freq: Once | INTRAMUSCULAR | Status: AC
  Administered 2019-12-25: 1 mg via INTRAVENOUS
  Filled 2019-12-25: qty 1

## 2019-12-25 MED ORDER — GABAPENTIN 300 MG PO CAPS
300.0000 mg | ORAL_CAPSULE | Freq: Three times a day (TID) | ORAL | Status: DC
  Administered 2019-12-25 – 2019-12-26 (×2): 300 mg via ORAL
  Filled 2019-12-25 (×2): qty 1

## 2019-12-25 MED ORDER — TRAZODONE HCL 100 MG PO TABS
200.0000 mg | ORAL_TABLET | Freq: Every day | ORAL | Status: DC
  Administered 2019-12-25: 200 mg via ORAL
  Filled 2019-12-25: qty 2

## 2019-12-25 MED ORDER — ACETAMINOPHEN 325 MG PO TABS: 650.0000 mg | ORAL_TABLET | ORAL | Status: DC | PRN

## 2019-12-25 MED ORDER — HALOPERIDOL LACTATE 5 MG/ML IJ SOLN
5.0000 mg | Freq: Once | INTRAMUSCULAR | Status: AC
  Administered 2019-12-25: 5 mg via INTRAVENOUS
  Filled 2019-12-25: qty 1

## 2019-12-25 MED ORDER — OMEPRAZOLE MAGNESIUM 20 MG PO TBEC: 20.0000 mg | DELAYED_RELEASE_TABLET | Freq: Every day | ORAL | Status: DC

## 2019-12-25 MED ORDER — NICOTINE 21 MG/24HR TD PT24
21.0000 mg | MEDICATED_PATCH | Freq: Every day | TRANSDERMAL | Status: DC
  Administered 2019-12-25: 21 mg via TRANSDERMAL
  Filled 2019-12-25 (×2): qty 1

## 2019-12-25 MED ORDER — ALPRAZOLAM 0.5 MG PO TABS
0.5000 mg | ORAL_TABLET | Freq: Four times a day (QID) | ORAL | Status: DC | PRN
  Filled 2019-12-25: qty 1

## 2019-12-25 MED ORDER — PAROXETINE HCL 20 MG PO TABS
20.0000 mg | ORAL_TABLET | Freq: Two times a day (BID) | ORAL | Status: DC
  Administered 2019-12-25 – 2019-12-26 (×2): 20 mg via ORAL
  Filled 2019-12-25 (×2): qty 1
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
## 2019-12-25 NOTE — ED Triage Notes (Signed)
Her son was called by pt., who told him she had taken an overdose. He,in turn, called EMS. She ostensibly took "a half a bottle of (2mg ) Xanax" [sic]. She arrives drowsy and in no distress.
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
## 2019-12-25 NOTE — ED Notes (Signed)
Pt attempted to elope and became combative with staff, Pt returned to room with security and was placed in soft restraints
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
## 2019-12-25 NOTE — BH Assessment (Deleted)
C-

## 2019-12-25 NOTE — BHH Counselor (Signed)
TTS spoke with Nurse Georgina Snell, per nurse pt is unable to complete assessment at this time, pt not alert/oriented, pt was given Haldol and Ativan at 2137. TTS to complete assessment once pt becomes alert/oriented, nurse to call TTS once pt is alert/oriented.

## 2019-12-26 ENCOUNTER — Inpatient Hospital Stay
Admission: RE | Admit: 2019-12-26 | Discharge: 2020-01-01 | DRG: 882 | Disposition: A | Discharge status: Home / Self Care | Payer: No Typology Code available for payment source | Source: Intra-hospital | Attending: Psychiatry | Admitting: Psychiatry

## 2019-12-26 DIAGNOSIS — G8929 Other chronic pain: Secondary | ICD-10-CM | POA: Diagnosis present

## 2019-12-26 DIAGNOSIS — T426X5A Adverse effect of other antiepileptic and sedative-hypnotic drugs, initial encounter: Secondary | ICD-10-CM | POA: Diagnosis not present

## 2019-12-26 DIAGNOSIS — Z888 Allergy status to other drugs, medicaments and biological substances status: Secondary | ICD-10-CM | POA: Diagnosis not present

## 2019-12-26 DIAGNOSIS — Z881 Allergy status to other antibiotic agents status: Secondary | ICD-10-CM

## 2019-12-26 DIAGNOSIS — F17210 Nicotine dependence, cigarettes, uncomplicated: Secondary | ICD-10-CM | POA: Diagnosis present

## 2019-12-26 DIAGNOSIS — L270 Generalized skin eruption due to drugs and medicaments taken internally: Secondary | ICD-10-CM | POA: Diagnosis not present

## 2019-12-26 DIAGNOSIS — I10 Essential (primary) hypertension: Secondary | ICD-10-CM | POA: Diagnosis present

## 2019-12-26 DIAGNOSIS — Y92230 Patient room in hospital as the place of occurrence of the external cause: Secondary | ICD-10-CM | POA: Diagnosis not present

## 2019-12-26 DIAGNOSIS — G4700 Insomnia, unspecified: Secondary | ICD-10-CM | POA: Diagnosis present

## 2019-12-26 DIAGNOSIS — Z915 Personal history of self-harm: Secondary | ICD-10-CM | POA: Diagnosis not present

## 2019-12-26 DIAGNOSIS — Z7982 Long term (current) use of aspirin: Secondary | ICD-10-CM

## 2019-12-26 DIAGNOSIS — M797 Fibromyalgia: Secondary | ICD-10-CM | POA: Diagnosis present

## 2019-12-26 DIAGNOSIS — F3130 Bipolar disorder, current episode depressed, mild or moderate severity, unspecified: Secondary | ICD-10-CM | POA: Diagnosis present

## 2019-12-26 DIAGNOSIS — Z79899 Other long term (current) drug therapy: Secondary | ICD-10-CM | POA: Diagnosis not present

## 2019-12-26 DIAGNOSIS — F4010 Social phobia, unspecified: Secondary | ICD-10-CM | POA: Diagnosis present

## 2019-12-26 DIAGNOSIS — F4320 Adjustment disorder, unspecified: Secondary | ICD-10-CM | POA: Diagnosis present

## 2019-12-26 DIAGNOSIS — T43215A Adverse effect of selective serotonin and norepinephrine reuptake inhibitors, initial encounter: Secondary | ICD-10-CM | POA: Diagnosis not present

## 2019-12-26 DIAGNOSIS — F411 Generalized anxiety disorder: Secondary | ICD-10-CM | POA: Diagnosis present

## 2019-12-26 DIAGNOSIS — F4310 Post-traumatic stress disorder, unspecified: Secondary | ICD-10-CM | POA: Diagnosis present

## 2019-12-26 DIAGNOSIS — R519 Headache, unspecified: Secondary | ICD-10-CM | POA: Diagnosis present

## 2019-12-26 DIAGNOSIS — F3181 Bipolar II disorder: Secondary | ICD-10-CM | POA: Diagnosis present

## 2019-12-26 DIAGNOSIS — F1020 Alcohol dependence, uncomplicated: Secondary | ICD-10-CM | POA: Diagnosis present

## 2019-12-26 DIAGNOSIS — F4000 Agoraphobia, unspecified: Secondary | ICD-10-CM | POA: Diagnosis present

## 2019-12-26 DIAGNOSIS — Z23 Encounter for immunization: Secondary | ICD-10-CM

## 2019-12-26 LAB — COMPREHENSIVE METABOLIC PANEL
ALT: 25 U/L (ref 0–44)
AST: 38 U/L (ref 15–41)
Albumin: 3.8 g/dL (ref 3.5–5.0)
Alkaline Phosphatase: 52 U/L (ref 38–126)
Anion gap: 11 (ref 5–15)
BUN: 10 mg/dL (ref 6–20)
CO2: 25 mmol/L (ref 22–32)
Calcium: 9.1 mg/dL (ref 8.9–10.3)
Chloride: 103 mmol/L (ref 98–111)
Creatinine, Ser: 0.65 mg/dL (ref 0.44–1.00)
GFR calc Af Amer: >60 mL/min (ref >60)
GFR calc non Af Amer: >60 mL/min (ref >60)
Glucose, Bld: 103 mg/dL — ABNORMAL HIGH (ref 70–99)
Potassium: 3.9 mmol/L (ref 3.5–5.1)
Sodium: 139 mmol/L (ref 135–145)
Total Bilirubin: 0.6 mg/dL (ref 0.3–1.2)
Total Protein: 7.1 g/dL (ref 6.5–8.1)

## 2019-12-26 LAB — CBC
HCT: 37.2 % (ref 36.0–46.0)
Hemoglobin: 10.8 g/dL — ABNORMAL LOW (ref 12.0–15.0)
MCH: 22 pg — ABNORMAL LOW (ref 26.0–34.0)
MCHC: 29 g/dL — ABNORMAL LOW (ref 30.0–36.0)
MCV: 75.6 fL — ABNORMAL LOW (ref 80.0–100.0)
Platelets: 293 10*3/uL (ref 150–400)
RBC: 4.92 MIL/uL (ref 3.87–5.11)
RDW: 21.4 % — ABNORMAL HIGH (ref 11.5–15.5)
WBC: 10.2 10*3/uL (ref 4.0–10.5)
nRBC: 0 % (ref 0.0–0.2)

## 2019-12-26 LAB — PHOSPHORUS: Phosphorus: 4.6 mg/dL (ref 2.5–4.6)

## 2019-12-26 LAB — MAGNESIUM: Magnesium: 2.3 mg/dL (ref 1.7–2.4)

## 2019-12-26 MED ORDER — DIPHENHYDRAMINE HCL 50 MG/ML IJ SOLN: 50.0000 mg | Freq: Once | INTRAMUSCULAR | Status: DC | PRN

## 2019-12-26 MED ORDER — LORAZEPAM 1 MG PO TABS
1.0000 mg | ORAL_TABLET | ORAL | Status: AC | PRN
  Filled 2019-12-26: qty 1

## 2019-12-26 MED ORDER — DIPHENHYDRAMINE HCL 50 MG/ML IJ SOLN
50.0000 mg | Freq: Once | INTRAMUSCULAR | Status: AC | PRN
  Administered 2019-12-28: 50 mg via INTRAMUSCULAR
  Filled 2019-12-26: qty 1

## 2019-12-26 MED ORDER — GABAPENTIN 300 MG PO CAPS
300.0000 mg | ORAL_CAPSULE | Freq: Three times a day (TID) | ORAL | Status: DC
  Administered 2019-12-27 (×2): 300 mg via ORAL
  Filled 2019-12-26 (×2): qty 1

## 2019-12-26 MED ORDER — ALUM & MAG HYDROXIDE-SIMETH 200-200-20 MG/5ML PO SUSP: 30.0000 mL | ORAL | Status: DC | PRN

## 2019-12-26 MED ORDER — LORAZEPAM 2 MG/ML IJ SOLN: 2.0000 mg | Freq: Once | INTRAMUSCULAR | Status: DC | PRN

## 2019-12-26 MED ORDER — DIPHENHYDRAMINE HCL 50 MG/ML IJ SOLN
50.0000 mg | Freq: Once | INTRAMUSCULAR | Status: AC
  Administered 2019-12-26: 50 mg via INTRAMUSCULAR
  Filled 2019-12-26: qty 1

## 2019-12-26 MED ORDER — MAGNESIUM HYDROXIDE 400 MG/5ML PO SUSP
30.0000 mL | Freq: Every day | ORAL | Status: DC | PRN
  Administered 2019-12-27: 30 mL via ORAL
  Filled 2019-12-26: qty 30

## 2019-12-26 MED ORDER — LORAZEPAM 1 MG PO TABS: 0.0000 mg | ORAL_TABLET | Freq: Four times a day (QID) | ORAL | Status: DC

## 2019-12-26 MED ORDER — THIAMINE HCL 100 MG/ML IJ SOLN: 100.0000 mg | Freq: Every day | INTRAMUSCULAR | Status: DC

## 2019-12-26 MED ORDER — PANTOPRAZOLE SODIUM 40 MG PO TBEC
40.0000 mg | DELAYED_RELEASE_TABLET | Freq: Every day | ORAL | Status: DC
  Administered 2019-12-27 – 2020-01-01 (×6): 40 mg via ORAL
  Filled 2019-12-26 (×6): qty 1

## 2019-12-26 MED ORDER — LORAZEPAM 2 MG/ML IJ SOLN: 1.0000 mg | INTRAMUSCULAR | Status: AC | PRN

## 2019-12-26 MED ORDER — LORAZEPAM 2 MG/ML IJ SOLN
2.0000 mg | Freq: Once | INTRAMUSCULAR | Status: AC
  Administered 2019-12-26: 2 mg via INTRAMUSCULAR
  Filled 2019-12-26: qty 1

## 2019-12-26 MED ORDER — ZIPRASIDONE MESYLATE 20 MG IM SOLR: 20.0000 mg | Freq: Once | INTRAMUSCULAR | Status: DC | PRN

## 2019-12-26 MED ORDER — FOLIC ACID 1 MG PO TABS
1.0000 mg | ORAL_TABLET | Freq: Every day | ORAL | Status: DC
  Administered 2019-12-27 – 2020-01-01 (×6): 1 mg via ORAL
  Filled 2019-12-26 (×6): qty 1

## 2019-12-26 MED ORDER — ATENOLOL 50 MG PO TABS
50.0000 mg | ORAL_TABLET | Freq: Every day | ORAL | Status: DC
  Administered 2019-12-27 – 2020-01-01 (×6): 50 mg via ORAL
  Filled 2019-12-26 (×6): qty 1

## 2019-12-26 MED ORDER — LORAZEPAM 1 MG PO TABS
0.0000 mg | ORAL_TABLET | Freq: Two times a day (BID) | ORAL | Status: DC
Start: 2019-12-28 — End: 2019-12-26

## 2019-12-26 MED ORDER — LORAZEPAM 2 MG PO TABS
0.0000 mg | ORAL_TABLET | Freq: Four times a day (QID) | ORAL | Status: AC
  Administered 2019-12-26 – 2019-12-28 (×3): 2 mg via ORAL
  Filled 2019-12-26 (×3): qty 1

## 2019-12-26 MED ORDER — NICOTINE 21 MG/24HR TD PT24: 21.0000 mg | MEDICATED_PATCH | Freq: Once | TRANSDERMAL | Status: DC

## 2019-12-26 MED ORDER — LORAZEPAM 2 MG/ML IJ SOLN: 1.0000 mg | INTRAMUSCULAR | Status: DC | PRN

## 2019-12-26 MED ORDER — FOLIC ACID 1 MG PO TABS: 1.0000 mg | ORAL_TABLET | Freq: Every day | ORAL | Status: DC

## 2019-12-26 MED ORDER — THIAMINE HCL 100 MG PO TABS
100.0000 mg | ORAL_TABLET | Freq: Every day | ORAL | Status: DC
  Administered 2019-12-27 – 2020-01-01 (×6): 100 mg via ORAL
  Filled 2019-12-26 (×6): qty 1

## 2019-12-26 MED ORDER — NICOTINE 21 MG/24HR TD PT24
21.0000 mg | MEDICATED_PATCH | Freq: Once | TRANSDERMAL | Status: DC
  Administered 2019-12-26: 21 mg via TRANSDERMAL

## 2019-12-26 MED ORDER — TRAZODONE HCL 100 MG PO TABS
200.0000 mg | ORAL_TABLET | Freq: Every day | ORAL | Status: DC
  Administered 2019-12-26: 200 mg via ORAL
  Filled 2019-12-26: qty 2

## 2019-12-26 MED ORDER — PAROXETINE HCL 20 MG PO TABS
20.0000 mg | ORAL_TABLET | Freq: Two times a day (BID) | ORAL | Status: DC
  Administered 2019-12-26: 20 mg via ORAL
  Filled 2019-12-26 (×3): qty 1

## 2019-12-26 MED ORDER — THIAMINE HCL 100 MG PO TABS: 100.0000 mg | ORAL_TABLET | Freq: Every day | ORAL | Status: DC

## 2019-12-26 MED ORDER — LORAZEPAM 2 MG PO TABS
0.0000 mg | ORAL_TABLET | Freq: Two times a day (BID) | ORAL | Status: AC
  Administered 2019-12-29: 1 mg via ORAL

## 2019-12-26 MED ORDER — LORAZEPAM 1 MG PO TABS: 1.0000 mg | ORAL_TABLET | ORAL | Status: DC | PRN

## 2019-12-26 MED ORDER — HALOPERIDOL LACTATE 5 MG/ML IJ SOLN
5.0000 mg | Freq: Once | INTRAMUSCULAR | Status: AC
  Administered 2019-12-26: 5 mg via INTRAMUSCULAR
  Filled 2019-12-26: qty 1

## 2019-12-26 NOTE — ED Notes (Signed)
Report attempted. Meghan at [facility] reported that they cannot accept the pt until after 1930. Charge nurse notified.

## 2019-12-26 NOTE — Progress Notes (Signed)
Consult request has been received. CSW attempting to follow up at present time.  Per chart review pt is D/C'ing to [facility] shortly.  Please reconsult if future social work needs arise.  CSW signing off, as social work intervention is no longer needed.  Alphonse Guild. Antonette Hendricks  MSW, LCSW, LCAS, CCS Transitions of Care Clinical Social Worker Care Coordination Department Ph: [phone]

## 2019-12-26 NOTE — ED Notes (Signed)
Patient became upset, stating she was leaving, attempted to remove Iv, Security called and MD contacted for meds

## 2019-12-26 NOTE — BH Assessment (Signed)
[facility] Assessment Progress Note  Per Hampton Abbot, MD, this pt requires psychiatric hospitalization.  Ian Malkin, Counselor reports that pt has been accepted to [facility] to Rm [facility]; they will not be ready to receive pt until next shift.  Pt presents under IVC initiated by EDP Aletta Edouard, MD, and IVC documents have been faxed to [phone].  Pt's nurse has been notified, and agrees to call report to [phone].  Pt is to be transported via [facility].   Jalene Mullet, [place] Coordinator [phone]

## 2019-12-26 NOTE — ED Provider Notes (Signed)
Blood pressure (!) 131/70, pulse 78, temperature 98.7 F (37.1 C), temperature source Oral, resp. rate 18, SpO2 92 %.  In short, Adriana Hall is a 49 y.o. female with a chief complaint of Drug Overdose .  Refer to the original H&P for additional details.  04:32 PM  Patient under IVC. Accepted to [facility] Evaluated patient and she awaken to voice and has vitals WNL. Accepting MD is Eulas Post. EMTALA completed.     Margette Fast, MD 12/26/19 [phone]

## 2019-12-26 NOTE — ED Notes (Signed)
Sheriff called for transport to [facility] call closer to 1900 to let us know if they are able to transport after shift change, if unable, they will transport in am.

## 2019-12-26 NOTE — ED Notes (Signed)
Pt in hall. Refusing to return to room. uncooperative with staff. threatening the run out of facility. Security at  Bedside. De-esculation tactics unsuccessful.

## 2019-12-26 NOTE — ED Provider Notes (Signed)
Emergency Medicine Observation Re-evaluation Note  Adriana Hall is a 49 y.o. female, seen on rounds today.  Pt initially presented to the ED for complaints of Drug Overdose Currently, the patient is lying in blood bed and is calm and cooperative.  However 10 minutes after I left the room patient then became agitated.  She left the room and is yelling to have a nicotine patch which was placed but is yelling to have her phone as well.  Patient is not redirectable with calm verbal de-escalation.  She is already seen psychiatry.Marland Kitchen  Physical Exam  BP 129/60 (BP Location: Right Arm)   Pulse 81   Temp 98.7 F (37.1 C) (Oral)   Resp 18   SpO2 100%  Physical Exam  Patient is awake alert and oriented.  She is walking without any difficulty.  She does not seem to have any areas of injury.  Vital signs are within normal limits.  Heart rate regular rate and rhythm, respiratory rate normal and no respiratory distress  ED Course / MDM  EKG:EKG Interpretation  Date/Time:  Wednesday December 25 2019 17:45:59 EDT Ventricular Rate:  78 PR Interval:    QRS Duration: 107 QT Interval:  420 QTC Calculation: 479 R Axis:   21 Text Interpretation: Sinus rhythm Abnormal inferior Q waves No significant change since prior 2/21 Confirmed by Aletta Edouard [phone]) on 12/25/2019 5:53:03 PM    I have reviewed the labs performed to date as well as medications administered while in observation.  Recent changes in the last 24 hours include none. Plan  Current plan is for inpatient psychiatric placement.  Because of patient's inability to cooperate, yelling and not being directable she was given Haldol, Benadryl and Ativan. Patient is under full IVC at this time.   Blanchie Dessert, MD 12/26/19 [phone]

## 2019-12-26 NOTE — ED Notes (Signed)
Pt talking with TTS machine

## 2019-12-26 NOTE — BH Assessment (Addendum)
Comprehensive Clinical Assessment (CCA) Note  12/26/2019 Adriana Hall [id number]  Visit Diagnosis:   MDD, recurrent, severe without sx of psychosis; Agoraphobia Disposition: Adriana Libra, NP recommends inpatient psychiatric admission  Adriana Hall is a married 49 yo female who presents voluntarily to [facility] via EMS. Pt called her son stating that she had taken an overdose of half a bottle of xanax after drinking about 5 beers. Pt reports it was about 12 2mg  xanax. She reports she took the pills because she doesn't want to live like this. Pt denies past suicide attempts and she denies current SI.  Pt has a history of Depression. Pt reports no psychiatric medication rx & is not prescribed the xanax or oxycodone she takes.   Pt acknowledges multiple symptoms of Depression, including anhedonia, isolating, feelings of worthlessness & guilt, tearfulness, changes in sleep & appetite, & increased irritability. Pt denies homicidal ideation/ history of violence. Pt denies auditory & visual hallucinations & other symptoms of psychosis. Pt states current stressors include decline in her health. 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 Pt states "I don't care, if it happens, it happens". Pt states she runs out of air/breath just walking to the front door to let her dog out. She reports anxiety prevents her from leaving her home and she has only been out of her home a couple of times in the past year. Pt also reports financial stress preventing her from getting medical tx. Due to her age and & being declined from [facility].  Pt & her spouse live in her son's home. Pt denies hx of abuse and trauma. Pt has impaired insight and judgment. Pt's memory is intact. Legal history includes no charges.  Protective factors against suicide include good family support, no current suicidal ideation, no access to firearms, & no current psychotic symptoms.?  Pt reports she  drinks a 12 pack of beer every night for the past 3 years. ? MSE: Pt is in a hospital gown, grooming appears neglected. Pt is alert but her eyes are closed most of the time. She is oriented x5 with normal speech and normal motor behavior. Pt's mood is depressed and anxious and affect is flat. Affect is congruent with mood. Thought process is coherent and relevant. There is no indication pt is currently responding to internal stimuli or experiencing delusional thought content. Pt was cooperative throughout assessment.    CCA Screening, Triage and Referral (STR)  Patient Reported Information How did you hear about [place]? Hospital Discharge  Referral name: EDP  Whom do you see for routine medical problems? Other (Comment) (Urgent care)  What Is the Reason for Your Visit/Call Today? overdose on xanax  How Long Has This Been Causing You Problems? > than 6 months  Have You Recently Been in Any Inpatient Treatment (Hospital/Detox/Crisis Center/28-Day Program)? No  Have You Ever Received Services From [employer] Before? Yes  Who Do You See at [facility]? years ago- can't remember who   Have You Recently Had Any Thoughts About Hurting Yourself? Yes  Are You Planning to Commit Suicide/Harm Yourself At This time? No   Have you Recently Had Thoughts About [place]? No   Have You Used Any Alcohol or Drugs in the Past 24 Hours? Yes  How Long Ago Did You Use Drugs or Alcohol? 1200  What Did You Use and How Much? about 12 2mg  xanax   Do You  Currently Have a Therapist/Psychiatrist? No   Have You Been Recently Discharged From Any Office Practice or Programs? No    CCA Screening Triage Referral Assessment Type of Contact: Tele-Assessment  Is this Initial or Reassessment? Initial Assessment  Date Telepsych consult ordered in [facility]:  12/25/19  Time Telepsych consult ordered in [facility]:  1829   Patient Reported Information Reviewed? No  Patient Left Without Being Seen?  No  Collateral Involvement: spouse, Adriana Hall  Is CPS involved or ever been involved? Never  Is APS involved or ever been involved? Never   Patient Determined To Be At Risk for Harm To Self or Others Based on Review of Patient Reported Information or Presenting Complaint? Yes, for Self-Harm  Are There Guns or Other Weapons in [place]? Yes  Types of Guns/Weapons: unsure- locked in son's room- pt has no access  Are These Weapons Safely Secured?                            Yes  Who Could Verify You Are Able To Have These Secured: spouse, son  Contacted To Inform of Risk of Harm To Self or Others: Family/Significant Other:   Location of Assessment: [facility]   Does Patient Present under Involuntary Commitment? No  [place] of Residence: [place]   Determination of Need: Emergent (2 hours)   Options For Referral: Medication Management;Inpatient Hospitalization     CCA Biopsychosocial  Intake/Chief Complaint:  CCA Intake With Chief Complaint CCA Part Two Date: 12/26/19 CCA Part Two Time: 0839 Chief Complaint/Presenting Problem: overdose Patient's Currently Reported Symptoms/Problems: depression, SI, anxiety Individual's Strengths: family support Type of Services Patient Feels Are Needed: none  Mental Health Symptoms Depression:  Depression: Change in energy/activity, Difficulty Concentrating, Fatigue, Hopelessness, Increase/decrease in appetite, Weight gain/loss, Sleep (too much or little), Irritability, Tearfulness, Duration of symptoms greater than two weeks, Worthlessness  Mania:  Mania: None  Anxiety:   Anxiety: Difficulty concentrating, Fatigue, Irritability, Restlessness, Sleep, Worrying, Tension  Psychosis:  Psychosis: None  Trauma:  Trauma: None  Obsessions:  Obsessions: None  Compulsions:  Compulsions: None  Inattention:  Inattention: None  Hyperactivity/Impulsivity:  Hyperactivity/Impulsivity: N/A  Oppositional/Defiant Behaviors:  Oppositional/Defiant  Behaviors: None  Emotional Irregularity:  Emotional Irregularity: None  Other Mood/Personality Symptoms:      Mental Status Exam Appearance and self-care  Stature:  Stature: Average  Weight:  Weight: Overweight  Clothing:  Clothing: Disheveled  Grooming:  Grooming: Neglected  Cosmetic use:  Cosmetic Use: None  Posture/gait:  Posture/Gait: Slumped  Motor activity:  Motor Activity: Slowed  Sensorium  Attention:  Attention: Normal  Concentration:  Concentration: Normal  Orientation:  Orientation: X5  Recall/memory:  Recall/Memory: Normal  Affect and Mood  Affect:  Affect: Flat  Mood:  Mood: Depressed, Anxious, Hopeless, Irritable  Relating  Eye contact:  Eye Contact: Avoided  Facial expression:  Facial Expression: Constricted  Attitude toward examiner:  Attitude Toward Examiner: Cooperative  Thought and Language  Speech flow: Speech Flow: Normal  Thought content:  Thought Content: Appropriate to Mood and Circumstances  Preoccupation:  Preoccupations: None  Hallucinations:  Hallucinations: None  Organization:     Transport planner of Knowledge:  Fund of Knowledge: Average  Intelligence:  Intelligence: Above Average  Abstraction:  Abstraction: Normal  Judgement:  Judgement: Impaired  Reality Testing:  Reality Testing: Adequate  Insight:  Insight: Fair  Decision Making:  Decision Making: Paralyzed  Social Functioning  Social Maturity:  Social Maturity:  Isolates  Social Judgement:  Social Judgement: Normal  Stress  Stressors:  Stressors: Illness, Museum/gallery curator, Grief/losses  Coping Ability:  Coping Ability: English as a second language teacher Deficits:  Skill Deficits:  (agoraphobia)  Supports:  Supports: Family     Religion: Religion/Spirituality Are You A Religious Person?: Yes What is Your Religious Affiliation?: Baptist How Might This Affect Treatment?: sometimes helps- youtube services  Leisure/Recreation: Leisure / Recreation Do You Have Hobbies?: Yes Leisure and Hobbies:  phone- cuisine around the world, especially liking Asian right now  Exercise/Diet: Exercise/Diet Do You Exercise?: No Have You Gained or Lost A Significant Amount of Weight in the Past Six Months?: Yes-Lost Number of Pounds Lost?: 60 Do You Follow a Special Diet?: No Do You Have Any Trouble Sleeping?: Yes Explanation of Sleeping Difficulties: 2.5   CCA Employment/Education  Employment/Work Situation: Employment / Work Situation Employment situation: Unemployed What is the longest time patient has a held a job?: 5-6 years Where was the patient employed at that time?: [employer] in [place].  Has patient ever been in the [employer]?: No  Education: Education Is Patient Currently Attending School?: No Did Teacher, adult education From [employer]?: No (GED)   CCA Family/Childhood History  Family and Relationship History: Family history Marital status: Married Number of Years Married: 10 Does patient have children?: Yes How many children?: 3  Childhood History:  Childhood History By whom was/is the patient raised?: Both parents Additional childhood history information: My childhood was great. My family is very close. It was wonderful. parents married Description of patient's relationship with caregiver when they were a child: close to both parents. loving family Does patient have siblings?: Yes Number of Siblings: 1 Description of patient's current relationship with siblings: no, he has cancer Did patient suffer any verbal/emotional/physical/sexual abuse as a child?: No Has patient ever been sexually abused/assaulted/raped as an adolescent or adult?: No Was the patient ever a victim of a crime or a disaster?: No Witnessed domestic violence?: No Has patient been affected by domestic violence as an adult?: No  Child/Adolescent Assessment:     CCA Substance Use  Alcohol/Drug Use: Alcohol / Drug Use Pain Medications: PRN Prescriptions: PRN History of alcohol / drug use?:  Yes Substance #1 Name of Substance 1: beer 1 - Amount (size/oz): 12 pack 1 - Frequency: nighly 1 - Duration: 3 years 1 - Last Use / Amount: 12/25/19   DSM5 Diagnoses: Patient Active Problem List   Diagnosis Date Noted  . GAD (generalized anxiety disorder) 05/21/2014  . Opioid dependence ([place]) 10/22/2013  . Major depression, recurrent ([place]) 10/22/2013  . IBS (irritable bowel syndrome) - followed by GI 09/10/2013  . GERD (gastroesophageal reflux disease) 02/25/2011  . Migraines 09/07/2010  . Post traumatic stress disorder (PTSD) 09/07/2010  . DDD (degenerative disc disease), cervical 09/07/2010  . Abnormal Pap smear and cervical HPV (human papillomavirus) 09/07/2010  . HYPERTRIGLYCERIDEMIA 07/30/2009  . HYPERTENSION, BENIGN 07/30/2009  . CAD 07/30/2009  . 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   Visit Diagnosis:   MDD, recurrent, severe without sx of psychosis; Agoraphobia Disposition: Adriana Libra, NP recommends inpatient psychiatric admission   [place]

## 2019-12-26 NOTE — BH Assessment (Signed)
Patient has been accepted to [facility].  Accepted by Nurse Practitioner Waylan Boga, on the behalf of Dr. Dwyane Dee.  Patient has been assigned to room 306, by [place]   Call report to [phone].  Representative/Transfer Coordinator is Dispensing optician Patient pre-admitted by [facility] Patient Access Elberta Fortis)   [facility] Staff Honor Loh, Disposition Social Worker) made aware of acceptance.

## 2019-12-26 NOTE — ED Notes (Signed)
Patient ambulated to restroom independently.

## 2019-12-26 NOTE — ED Notes (Signed)
Patient ambulated to bathroom at this time.

## 2019-12-26 NOTE — BH Assessment (Addendum)
[place] Assessment Progress Note  Per Hampton Abbot, MD, this pt requires psychiatric hospitalization at this time.  Pt presents under IVC initiated by EDP Aletta Edouard, MD.  Pt's problem list includes agoraphobia which may complicate ability to program.  At 11:07 this writer called [facility] bed control at [phone] to ask them to consider pt for admission.  Phone rang repeatedly but no one picked up and call did not roll to voice mail.  Will try again later.  Will also refer to [facility] at this time.  Jalene Mullet, [place] [facility] Coordinator [phone]   Addendum:  At 12:46 this writer reached Nira Conn, RN, bed control at [facility] [place] her that pt needs a bed.  Final disposition is pending as of this writing.  Jalene Mullet, [place] Coordinator [phone]

## 2019-12-27 ENCOUNTER — Other Ambulatory Visit: Payer: Self-pay

## 2019-12-27 ENCOUNTER — Encounter: Payer: Self-pay | Admitting: Internal Medicine

## 2019-12-27 LAB — MAGNESIUM: Magnesium: 2.2 mg/dL (ref 1.7–2.4)

## 2019-12-27 MED ORDER — NICOTINE 21 MG/24HR TD PT24
21.0000 mg | MEDICATED_PATCH | Freq: Every day | TRANSDERMAL | Status: DC
  Administered 2019-12-27 – 2020-01-01 (×6): 21 mg via TRANSDERMAL
  Filled 2019-12-27 (×6): qty 1

## 2019-12-27 MED ORDER — DOXEPIN HCL 10 MG PO CAPS
30.0000 mg | ORAL_CAPSULE | Freq: Every day | ORAL | Status: DC
  Administered 2019-12-27 – 2019-12-28 (×2): 30 mg via ORAL
  Filled 2019-12-27 (×3): qty 3

## 2019-12-27 MED ORDER — DULOXETINE HCL 30 MG PO CPEP
30.0000 mg | ORAL_CAPSULE | Freq: Every day | ORAL | Status: DC
  Administered 2019-12-27 – 2019-12-29 (×3): 30 mg via ORAL
  Filled 2019-12-27 (×3): qty 1

## 2019-12-27 MED ORDER — GABAPENTIN 400 MG PO CAPS
400.0000 mg | ORAL_CAPSULE | Freq: Three times a day (TID) | ORAL | Status: DC
  Administered 2019-12-27 – 2020-01-01 (×15): 400 mg via ORAL
  Filled 2019-12-27 (×15): qty 1

## 2019-12-27 MED ORDER — OLANZAPINE 5 MG PO TABS
2.5000 mg | ORAL_TABLET | Freq: Every day | ORAL | Status: DC
  Administered 2019-12-27 – 2019-12-31 (×4): 2.5 mg via ORAL
  Filled 2019-12-27 (×5): qty 1

## 2019-12-27 MED ORDER — LAMOTRIGINE 25 MG PO TABS
25.0000 mg | ORAL_TABLET | Freq: Two times a day (BID) | ORAL | Status: DC
  Administered 2019-12-27 – 2019-12-29 (×4): 25 mg via ORAL
  Filled 2019-12-27 (×4): qty 1

## 2019-12-27 MED ORDER — PNEUMOCOCCAL VAC POLYVALENT 25 MCG/0.5ML IJ INJ
0.5000 mL | INJECTION | INTRAMUSCULAR | Status: AC
  Administered 2019-12-28: 0.5 mL via INTRAMUSCULAR
  Filled 2019-12-27 (×2): qty 0.5

## 2019-12-27 NOTE — Progress Notes (Signed)
Patient refused to take Paxil, stating that she has not taken it in a while, she has been taking Cymbalta. MD notified.

## 2019-12-27 NOTE — Progress Notes (Signed)
Recreation Therapy Notes  INPATIENT RECREATION THERAPY ASSESSMENT  Patient Details Name: Kayleeann Huxford Dittman MRN: [id number] DOB: 1971/03/19 Today's Date: 12/27/2019       Information Obtained From: Patient  Able to Participate in Assessment/Interview: Yes  Patient Presentation: Responsive  Reason for Admission (Per Patient): Active Symptoms  Patient Stressors:    Coping Skills:   Talk, Other (Comment) (Have a drink, smoke)  Leisure Interests (2+):  Individual - TV (Youtube)  Frequency of Recreation/Participation: Monthly  Awareness of Community Resources:  Yes  Community Resources:  [employer]  Current Use:    If no, Barriers?:    Expressed Interest in [place] of Residence:  [place]  Patient Main Form of Transportation: Musician  Patient Strengths:  Good listener; caring  Patient Identified Areas of Improvement:  Not be alone  Patient Goal for Hospitalization:  To get out more often  Current SI (including self-harm):  No  Current HI:  No  Current AVH: No  Staff Intervention Plan: Group Attendance, Collaborate with Interdisciplinary Treatment Team  Consent to Intern Participation: N/A  Correen Bubolz 12/27/2019, 3:08 PM

## 2019-12-27 NOTE — Plan of Care (Signed)
D- Patient alert and oriented. Patient presented in a pleasant mood on assessment stating that she didn't sleep well last night because "I don't sleep good unless I take a bunch of medicine". Patient had complaints of neck and back pain, rating it a "7/10", in which she did not have any PRN medication available, however, MD was notified of these findings. Patient endorsed both depression and anxiety, reporting that "being here", is making her feel this way. Patient denies SI, HI, AVH at this time. Patient's goal for today is to "move around more", in which she will "get up and do it", in order to achieve her goal. Patient has been out in the milieu a few times throughout the day.  A- Scheduled medications administered to patient, per MD orders. Support and encouragement provided.  Routine safety checks conducted every 15 minutes.  Patient informed to notify staff with problems or concerns.  R- No adverse drug reactions noted. Patient contracts for safety at this time. Patient compliant with medications and treatment plan. Patient receptive, calm, and cooperative. Patient interacts well with others on the unit.  Patient remains safe at this time.  Problem: Education: Goal: Utilization of techniques to improve thought processes will improve Outcome: Progressing Goal: Knowledge of the prescribed therapeutic regimen will improve Outcome: Progressing   Problem: Activity: Goal: Interest or engagement in leisure activities will improve Outcome: Progressing Goal: Imbalance in normal sleep/wake cycle will improve Outcome: Progressing   Problem: Coping: Goal: Coping ability will improve Outcome: Progressing Goal: Will verbalize feelings Outcome: Progressing   Problem: Health Behavior/Discharge Planning: Goal: Ability to make decisions will improve Outcome: Progressing Goal: Compliance with therapeutic regimen will improve Outcome: Progressing   Problem: Role Relationship: Goal: Will demonstrate  positive changes in social behaviors and relationships Outcome: Progressing   Problem: Safety: Goal: Ability to disclose and discuss suicidal ideas will improve Outcome: Progressing Goal: Ability to identify and utilize support systems that promote safety will improve Outcome: Progressing   Problem: Self-Concept: Goal: Will verbalize positive feelings about self Outcome: Progressing Goal: Level of anxiety will decrease Outcome: Progressing

## 2019-12-27 NOTE — Plan of Care (Signed)
Patient newly admitted to [facility] Medicine Inpatient unit a few hours ago.
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
## 2019-12-27 NOTE — BHH Counselor (Signed)
Adult Comprehensive Assessment  Patient ID: Adriana Hall, female   DOB: 31-Oct-1970, 49 y.o.   MRN: [id number]  Information Source: Information source: Patient  Current Stressors:  Patient states their primary concerns and needs for treatment are:: "see i"ve been taking care of my mother, and I haven't slept since Sunday or Monday and I took some of my husband's Xanax" Patient states their goals for this hospitilization and ongoing recovery are:: "I just need some help getting to sleep" Educational / Learning stressors: Pt denies. Employment / Job issues: Pt denies. Family Relationships: "just my motherPublishing copy / Lack of resources (include bankruptcy): Pt denies. Housing / Lack of housing: "having to live with my mother" Physical health (include injuries & life threatening diseases): "irregular heartbeat, dislocated discs in my back" Social relationships: "I don't have any" Substance abuse: "I smoke 2 1/2 pack or 3 packs of ciggarettes a day. I drink 8-12 beers in one sitting but I can go a month without it then have to have it everyday for 2-3 days." Bereavement / Loss: Pt denies.  Living/Environment/Situation:  Living Arrangements: Spouse/significant other, Children, Parent Who else lives in the home?: "My mother, my husband, my son and my daughter" How long has patient lived in current situation?: "5 years" What is atmosphere in current home: Other (Comment) ("all she does is complain and everyone else just kinds sticks to themself")  Family History:  Marital status: Married Number of Years Married: 45 What types of issues is patient dealing with in the relationship?: "having to live with my mother" Does patient have children?: Yes How many children?: 3 How is patient's relationship with their children?: "It's really good with my oldest and my youngest. I have my moments with my middle child."  Childhood History:  By whom was/is the patient raised?: Both parents Description  of patient's relationship with caregiver when they were a child: "wonderful" Patient's description of current relationship with people who raised him/her: "My dad dies when I was 64.  My mother can be difficult." How were you disciplined when you got in trouble as a child/adolescent?: "usually grounded" Does patient have siblings?: Yes Number of Siblings: 1 Description of patient's current relationship with siblings: "He was disgnosed with cancer and I haven't really talked to him.  He was given until [employer]." Did patient suffer any verbal/emotional/physical/sexual abuse as a child?: No Did patient suffer from severe childhood neglect?: No Has patient ever been sexually abused/assaulted/raped as an adolescent or adult?: No Was the patient ever a victim of a crime or a disaster?: No Witnessed domestic violence?: No Has patient been affected by domestic violence as an adult?: No  Education:  Highest grade of school patient has completed: some college Currently a Ship broker?: No Learning disability?: No  Employment/Work Situation:   Employment situation: Unemployed What is the longest time patient has a held a job?: "10 years" Where was the patient employed at that time?: "[employer]" Has patient ever been in the [employer]?: No  Financial Resources:   Financial resources: Income from spouse Does patient have a representative payee or guardian?: No  Alcohol/Substance Abuse:   What has been your use of drugs/alcohol within the last 12 months?: "I smoke 2 1/2 pack or 3 packs of ciggarettes a day. I drink 8-12 beers in one sitting but I can go a month without it then have to have it everyday for 2-3 days." Alcohol/Substance Abuse Treatment Hx: Past Tx, Inpatient If yes, describe treatment: "it was okay" Has  alcohol/substance abuse ever caused legal problems?: No  Social Support System:   Describe Community Support System: "My pastor, my son, my daughter and my husband" Type of  faith/religion: "Baptist" How does patient's faith help to cope with current illness?: "I use my study Bible"  Leisure/Recreation:   Do You Have Hobbies?: No  Strengths/Needs:   What is the patient's perception of their strengths?: "I am a great listener and am loyal to a fault" Patient states these barriers may affect/interfere with their treatment: Pt reports that she has not left the home but 2x in two years. Patient states these barriers may affect their return to the community: Pt denies.  Discharge Plan:   Currently receiving community mental health services: No Patient states concerns and preferences for aftercare planning are: Pt reports that she is open to an aftercare referral. Patient states they will know when they are safe and ready for discharge when: "Because I didn't really try to kill myself.  I just wanted to sleep." Does patient have access to transportation?: Yes Does patient have financial barriers related to discharge medications?: Yes Patient description of barriers related to discharge medications: Pt reports that she does not have insurance. Will patient be returning to same living situation after discharge?: Yes  Summary/Recommendations:   Summary and Recommendations (to be completed by the evaluator): Patient is a 49 year old married female from [place], [place] ([place]).   She presents to the hospital following suicidal ideations and reported overdose on her husband's medications.  She has a primary diagnosis of Posttraumatic Stress Disorder.  Recommendations include: crisis stabilization, therapeutic milieu, encourage group attendance and participation, medication management for detox/mood stabilization and development of comprehensive mental wellness/sobriety plan.  Rozann Lesches. 12/27/2019

## 2019-12-27 NOTE — BHH Suicide Risk Assessment (Signed)
[facility] Admission Suicide Risk Assessment   Nursing information obtained from:   report   Demographic factors:      Many people in house, mom having surgery and perceived as overbearing --no clinic for one year    Current Mental Status:   already written in Iniital eval  Loss Factors:   father five years ago  Historical Factors:   feels bored at home wants new school and job  Risk Reduction Factors:      Encourage compliance, job school and hobbies ---work to separate she and mom   Total Time spent with patient: 45 min- one hour  Principal Problem: bipolar 2 anxiety and  Adjustment issues  Diagnosis:  See above  Subjective Data:  Post accidental xanax overdose in wanting to sleep Not suicidal but used poor judgement she says   Continued Clinical Symptoms:  Alcohol Use Disorder Identification Test Final Score (AUDIT): 22 The "Alcohol Use Disorders Identification Test", Guidelines for Use in Primary Care, Second Edition.  World Pharmacologist [facility]). Score between 0-7:  no or low risk or alcohol related problems. Score between 8-15:  moderate risk of alcohol related problems. Score between 16-19:  high risk of alcohol related problems. Score 20 or above:  warrants further diagnostic evaluation for alcohol dependence and treatment.   CLINICAL FACTORS:   already discussed   Psychiatric Specialty Exam: Physical Exam  Review of Systems  Blood pressure (!) 129/61, pulse 86, temperature 98.6 F (37 C), temperature source Oral, resp. rate 18, SpO2 98 %.There is no height or weight on file to calculate BMI.    Mental Status --already written into the Psych eval today                                                      COGNITIVE FEATURES THAT CONTRIBUTE TO RISK:  Need for regular visits to clinic for meds    SUICIDE RISK:  Low to low medium   PLAN OF CARE:   Med mgt and rounds, CW rounds, groups, milieu psychoeducation discharge planning ----safety  tips  Treatment teams Rec therapy   I certify that inpatient services furnished can reasonably be expected to improve the patient's condition.   Eulas Post, MD 12/27/2019, 2:22 PM

## 2019-12-27 NOTE — BHH Group Notes (Signed)
[facility] Group Notes:  (Nursing/MHT/Case Management/Adjunct)  Date:  12/27/2019  Time:  10:29 PM  Type of Therapy:  Group Therapy  Participation Level:  Active  Participation Quality:  Appropriate and Attentive  Affect:  Appropriate  Cognitive:  Alert and Appropriate  Insight:  Appropriate and Good  Engagement in Group:  Engaged  Modes of Intervention:  Goals/Wrap up group  Summary of Progress/Problems: MHT conducted goals and wrap up group encouraging patient to participate in setting goals daily in order increase self-fulfillment. Patient stated that hrt goal for the day is to get up and move around more than she has recently. Patient agrees that she met this goal for the day and is going to continue working towards this goal even outside of the facility. Patient also expresses at this point and time she is doing okay mentally but is experiencing some pain physically.  Maylene Roes 12/27/2019, 10:29 PM

## 2019-12-27 NOTE — Progress Notes (Signed)
Patient admitted to unit from [facility] after reported overdose on a half of bottle of Xanax and 5 beers. Patient reports that her stressors are marital issues and being afraid to come out the house. She is Ox4, she reports being depressed. Searched by two RN's on admission, no contraband found. No issues to report on shift at this time.

## 2019-12-27 NOTE — Progress Notes (Signed)
Recreation Therapy Notes   Date: 12/27/2019  Time: 9:30 am   Location: Craft room     Behavioral response: N/A   Intervention Topic: Happiness    Discussion/Intervention: Patient did not attend group.   Clinical Observations/Feedback:  Patient did not attend group.   Lucas Winograd LRT/CTRS        Kaleya Douse 12/27/2019 12:19 PM

## 2019-12-27 NOTE — Tx Team (Signed)
Initial Treatment Plan 12/27/2019 4:54 AM Adriana Hall [id number]:[id number]    PATIENT STRESSORS: Financial difficulties Marital or family conflict   PATIENT STRENGTHS: Communication skills Supportive family/friends   PATIENT IDENTIFIED PROBLEMS: Marital issues  Major Depression                   DISCHARGE CRITERIA:  Improved stabilization in mood, thinking, and/or behavior Verbal commitment to aftercare and medication compliance  PRELIMINARY DISCHARGE PLAN: Outpatient therapy Return to previous living arrangement  PATIENT/FAMILY INVOLVEMENT: This treatment plan has been presented to and reviewed with the patient, Adriana Hall, and/or family member,.  The patient and family have been given the opportunity to ask questions and make suggestions.  Isabella Bowens, RN 12/27/2019, 4:54 AM

## 2019-12-28 MED ORDER — DIPHENHYDRAMINE HCL 25 MG PO CAPS
50.0000 mg | ORAL_CAPSULE | Freq: Four times a day (QID) | ORAL | Status: DC | PRN
  Administered 2019-12-28 – 2019-12-30 (×2): 50 mg via ORAL
  Filled 2019-12-28 (×2): qty 2

## 2019-12-28 NOTE — Plan of Care (Signed)
D: Pt alert and oriented x 4. Pt rates depression 4/10, hopelessness 4/10, and anxiety 4/10. Pt goal: "my own mindset." Pt reports energy level as low and concentration as being good. Pt reports sleep last night as being poor. Pt did receive medications for sleep and did not find them helpful. Pt reports experiencing 7/10 headache pain, prn meds offered and pt refused stating she just wanted to lay down and rest a while, that may help. Pt denies experiencing any SI/HI, or AVH at this time.   A: Scheduled medications administered to pt, per MD orders. Support and encouragement provided. Frequent verbal contact made. Routine safety checks conducted q15 minutes.   R: No adverse drug reactions noted. Pt verbally contracts for safety at this time. Pt complaint with medications and treatment plan. Pt interacts well with others on the unit. Pt remains safe at this time. Will continue to monitor.   Problem: Education: Goal: Utilization of techniques to improve thought processes will improve Outcome: Not Progressing Goal: Knowledge of the prescribed therapeutic regimen will improve Outcome: Not Progressing   Problem: Activity: Goal: Interest or engagement in leisure activities will improve Outcome: Not Progressing Goal: Imbalance in normal sleep/wake cycle will improve Outcome: Not Progressing   Problem: Coping: Goal: Coping ability will improve Outcome: Not Progressing Goal: Will verbalize feelings Outcome: Not Progressing   Problem: Health Behavior/Discharge Planning: Goal: Ability to make decisions will improve Outcome: Not Progressing Goal: Compliance with therapeutic regimen will improve Outcome: Not Progressing   Problem: Role Relationship: Goal: Will demonstrate positive changes in social behaviors and relationships Outcome: Not Progressing   Problem: Safety: Goal: Ability to disclose and discuss suicidal ideas will improve Outcome: Not Progressing Goal: Ability to identify and  utilize support systems that promote safety will improve Outcome: Not Progressing   Problem: Self-Concept: Goal: Will verbalize positive feelings about self Outcome: Not Progressing Goal: Level of anxiety will decrease Outcome: Not Progressing

## 2019-12-28 NOTE — Progress Notes (Signed)
Patient alert and oriented x 4, affect is blunted thoughts are organized and coherent, she denies SI/HI/AVH interacting appropriately with peers and staff and compliant with medication regimen, 15 minutes safety checks maintained will continue to monitor.

## 2019-12-28 NOTE — Tx Team (Signed)
Interdisciplinary Treatment and Diagnostic Plan Update  12/28/2019 Time of Session: 12:07 PM Adriana Hall MRN: [id number]  Principal Diagnosis: <principal problem not specified>  Secondary Diagnoses: Active Problems:   PTSD (post-traumatic stress disorder)   Current Medications:  Current Facility-Administered Medications  Medication Dose Route Frequency Provider Last Rate Last Admin  . alum & mag hydroxide-simeth (MAALOX/MYLANTA) 200-200-20 MG/5ML suspension 30 mL  30 mL Oral Q4H PRN Patrecia Pour, NP      . atenolol (TENORMIN) tablet 50 mg  50 mg Oral Daily Patrecia Pour, NP   50 mg at 12/28/19 1132  . doxepin (SINEQUAN) capsule 30 mg  30 mg Oral QHS Eulas Post, MD   30 mg at 12/27/19 2148  . DULoxetine (CYMBALTA) DR capsule 30 mg  30 mg Oral Daily Eulas Post, MD   30 mg at 12/28/19 0811  . folic acid (FOLVITE) tablet 1 mg  1 mg Oral Daily Patrecia Pour, NP   1 mg at 12/28/19 2800  . gabapentin (NEURONTIN) capsule 400 mg  400 mg Oral TID Eulas Post, MD   400 mg at 12/28/19 1134  . lamoTRIgine (LAMICTAL) tablet 25 mg  25 mg Oral BID Eulas Post, MD   25 mg at 12/28/19 0811  . LORazepam (ATIVAN) tablet 1-4 mg  1-4 mg Oral Q1H PRN Patrecia Pour, NP       Or  . LORazepam (ATIVAN) injection 1-4 mg  1-4 mg Intravenous Q1H PRN Patrecia Pour, NP      . LORazepam (ATIVAN) injection 2 mg  2 mg Intramuscular Once PRN Patrecia Pour, NP      . LORazepam (ATIVAN) tablet 0-4 mg  0-4 mg Oral Q6H Patrecia Pour, NP   2 mg at 12/28/19 0358   Followed by  . LORazepam (ATIVAN) tablet 0-4 mg  0-4 mg Oral Q12H Lord, Jamison Y, NP      . magnesium hydroxide (MILK OF MAGNESIA) suspension 30 mL  30 mL Oral Daily PRN Patrecia Pour, NP   30 mL at 12/27/19 0850  . nicotine (NICODERM CQ - dosed in mg/24 hours) patch 21 mg  21 mg Transdermal Daily Eulas Post, MD   21 mg at 12/28/19 0815  . OLANZapine (ZYPREXA) tablet 2.5 mg  2.5 mg Oral QHS Eulas Post, MD   2.5  mg at 12/27/19 2149  . pantoprazole (PROTONIX) EC tablet 40 mg  40 mg Oral Daily Patrecia Pour, NP   40 mg at 12/28/19 3491  . thiamine tablet 100 mg  100 mg Oral Daily Patrecia Pour, NP   100 mg at 12/28/19 7915   Or  . thiamine (B-1) injection 100 mg  100 mg Intravenous Daily Patrecia Pour, NP       PTA Medications: Medications Prior to Admission  Medication Sig Dispense Refill Last Dose  . Aspirin-Acetaminophen-Caffeine (GOODY HEADACHE PO) Take 1 packet by mouth every 6 (six) hours.     Marland Kitchen atenolol (TENORMIN) 50 MG tablet TAKE 1 TABLET BY MOUTH ONCE DAILY IN THE EVENING FOR BLOOD PRESSURE (Patient taking differently: Take 50 mg by mouth daily. TAKE 1 TABLET BY MOUTH ONCE DAILY IN THE EVENING FOR BLOOD PRESSURE) 90 tablet 1   . diphenhydrAMINE (BENADRYL) 25 MG tablet Take 50 mg by mouth at bedtime.     . gabapentin (NEURONTIN) 300 MG capsule Take 300 mg by mouth 3 (three) times daily.     Marland Kitchen ibuprofen (ADVIL) 200 MG tablet Take 400  mg by mouth every 6 (six) hours as needed for fever or moderate pain.     Marland Kitchen omeprazole (PRILOSEC OTC) 20 MG tablet Take 2 tablets (40 mg total) by mouth at bedtime. For acid reflux (Patient taking differently: Take 20 mg by mouth daily. For acid reflux)     . PARoxetine (PAXIL) 20 MG tablet Take 1 tablet (20 mg total) by mouth 2 (two) times daily. (Patient not taking: Reported on 12/26/2019) 28 tablet 0   . traZODone (DESYREL) 100 MG tablet Take 2 tablets (200 mg total) by mouth at bedtime. 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   Physician Treatment Plan for Primary Diagnosis: <principal problem not specified> Long Term Goal(s):     Short Term Goals:    Medication Management:  Evaluate patient's response, side effects, and tolerance of medication regimen.  Therapeutic Interventions: 1 to 1 sessions, Unit Group sessions and Medication administration.  Evaluation of Outcomes: Not Met  Physician Treatment Plan for Secondary Diagnosis: Active Problems:   PTSD (post-traumatic stress disorder)  Long Term Goal(s):     Short Term Goals:       Medication Management: Evaluate patient's response, side effects, and tolerance of medication regimen.  Therapeutic Interventions: 1 to 1 sessions, Unit Group sessions and Medication administration.  Evaluation of Outcomes: Not Met   RN Treatment Plan for Primary Diagnosis: <principal problem not specified> Long Term Goal(s): Knowledge of disease and therapeutic regimen to maintain health will improve  Short Term Goals: Ability to remain free from injury will improve, Ability to demonstrate self-control, Ability to participate in decision making will improve, Ability to verbalize feelings will improve, Ability to disclose and discuss suicidal ideas, Ability to identify and develop effective coping behaviors will improve and Compliance with prescribed medications will improve  Medication Management: RN will administer medications as ordered by provider, will assess and evaluate patient's response and provide education to patient for prescribed medication. RN will report any adverse and/or side effects to prescribing provider.  Therapeutic Interventions: 1 on 1 counseling sessions, Psychoeducation, Medication administration, Evaluate responses to treatment, Monitor vital signs and CBGs as ordered, Perform/monitor CIWA, COWS, AIMS and Fall Risk screenings as ordered, Perform wound care treatments as ordered.  Evaluation of Outcomes: Not Met   LCSW Treatment Plan for Primary Diagnosis: <principal problem not specified> Long Term Goal(s): Safe transition to appropriate next level of care at discharge, Engage patient in therapeutic  group addressing interpersonal concerns.  Short Term Goals: Engage patient in aftercare planning with referrals and resources, Increase social support, Increase ability to appropriately verbalize feelings, Increase emotional regulation and Increase skills for wellness and recovery  Therapeutic Interventions: Assess for all discharge needs, 1 to 1 time with Social worker, Explore available resources and support systems, Assess for adequacy in community support network, Educate family and significant other(s) on suicide prevention, Complete Psychosocial Assessment, Interpersonal group therapy.  Evaluation of Outcomes: Not Met   Progress in Treatment: Attending groups: Yes. Participating in groups: Yes. Taking medication as prescribed: Yes. Toleration medication: Yes. Family/Significant other contact made: No, will contact:  Attempt made to obtain husbands number  Patient understands diagnosis: Yes. Discussing patient identified problems/goals with staff: Yes. Medical problems stabilized or resolved: Yes. Denies suicidal/homicidal ideation: Yes. Issues/concerns per patient self-inventory: No. Other:  New problem(s) identified: No, Describe:  None   New Short Term/Long Term Goal(s): medication stabilization, elimination of SI thoughts, development of comprehensive mental wellness plan   Patient Goals: Patient is interested in outpatient therapy and medication management when discharged.    Discharge Plan or Barriers: Patient spoke about new housing and not being able to go back to her mothers home. Patient also needs LE assistance to get her belongings from her mothers home.   Reason for Continuation of Hospitalization: Depression Suicidal ideation  Estimated Length of Stay: 5-7 Days   Attendees: Patient: 12/28/2019 1:44 PM  Physician: Dr. Hedwig Morton, MD 12/28/2019 1:44 PM  Nursing: Carron Brazen, RN 12/28/2019 1:44 PM  RN Care Manager: 12/28/2019 1:44 PM  Social Worker: Raina Mina, [place]  12/28/2019 1:44 PM  Recreational Therapist:  12/28/2019 1:44 PM  Other:  12/28/2019 1:44 PM  Other:  12/28/2019 1:44 PM  Other: 12/28/2019 1:44 PM    Scribe for Treatment Team: Raina Mina, [place] 12/28/2019 1:44 PM

## 2019-12-28 NOTE — [facility] Group Notes (Signed)
LCSW Group Therapy 12/28/19 2:15-2:50 PM    Type of Therapy and Topic:  Group Therapy:  Setting Goals   Participation Level:  Active   Description of Group: In this process group, patients discussed using strengths to work toward goals and address challenges.  Patients identified two positive things about themselves and one goal they were working on.  Patients were given the opportunity to share openly and support each other's plan for self-empowerment.  The group discussed the value of gratitude and were encouraged to have a daily reflection of positive characteristics or circumstances.  Patients were encouraged to identify a plan to utilize their strengths to work on current challenges and goals.   Therapeutic Goals 1. Patient will verbalize personal strengths/positive qualities and relate how these can assist with achieving desired personal goals 2. Patients will verbalize affirmation of peers plans for personal change and goal setting 3. Patients will explore the value of gratitude and positive focus as related to successful achievement of goals 4. Patients will verbalize a plan for regular reinforcement of personal positive qualities and circumstances.   Summary of Patient Progress: Patient checked into group feeling okay. Patients was given the opportunity to share openly and support other group members' plan for self-empowerment. Patient did well working with another patient about challenges with achieving goals. Patient spoke about procrastination is a challenge she struggles with. Patient did not share a personal change or goal she would like to achieve for discharge.    Therapeutic Modalities Cognitive Behavioral Therapy Motivational Interviewing

## 2019-12-28 NOTE — Progress Notes (Signed)
Patient alert and oriented x 4, affect is blunted thoughts are organized and coherent , she appears less anxious interacting appropriately with peers and staff complaint with medication regimen, 15 minutes safety checks maintained will continue to monitor.
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
## 2019-12-29 MED ORDER — MENTHOL 3 MG MT LOZG
1.0000 | LOZENGE | OROMUCOSAL | Status: DC | PRN
  Administered 2019-12-30: 3 mg via ORAL
  Filled 2019-12-29: qty 9

## 2019-12-29 MED ORDER — TRAMADOL-ACETAMINOPHEN 37.5-325 MG PO TABS
1.0000 | ORAL_TABLET | Freq: Four times a day (QID) | ORAL | Status: DC | PRN
  Administered 2019-12-29 – 2020-01-01 (×6): 1 via ORAL
  Filled 2019-12-29 (×6): qty 1

## 2019-12-29 NOTE — Plan of Care (Signed)
D- Patient alert and oriented. Patient presents in a pleasant mood on assessment stating that she couldn't sleep well last night "because of an allergic reaction to medicine". Patient continues to endorse lower back pain, in which the MD will be notified, in order to obtain medication for these purposes. Patient endorsed both depression/anxiety, reporting that not getting enough sleep and the reaction to the night time medications has her feeling this way. Patient denies SI, HI, AVH at this time. Patient's goal for today is "to get in a routine", in which she will "make mini goals and follow through", in order to achieve her goal.  A- Scheduled medications administered to patient, per MD orders. Support and encouragement provided.  Routine safety checks conducted every 15 minutes.  Patient informed to notify staff with problems or concerns.  R- No adverse drug reactions noted. Patient contracts for safety at this time. Patient compliant with medications and treatment plan. Patient receptive, calm, and cooperative. Patient interacts well with others on the unit.  Patient remains safe at this time.  Problem: Education: Goal: Utilization of techniques to improve thought processes will improve Outcome: Progressing Goal: Knowledge of the prescribed therapeutic regimen will improve Outcome: Progressing   Problem: Activity: Goal: Interest or engagement in leisure activities will improve Outcome: Progressing Goal: Imbalance in normal sleep/wake cycle will improve Outcome: Progressing   Problem: Coping: Goal: Coping ability will improve Outcome: Progressing Goal: Will verbalize feelings Outcome: Progressing   Problem: Health Behavior/Discharge Planning: Goal: Ability to make decisions will improve Outcome: Progressing Goal: Compliance with therapeutic regimen will improve Outcome: Progressing   Problem: Role Relationship: Goal: Will demonstrate positive changes in social behaviors and  relationships Outcome: Progressing   Problem: Safety: Goal: Ability to disclose and discuss suicidal ideas will improve Outcome: Progressing Goal: Ability to identify and utilize support systems that promote safety will improve Outcome: Progressing   Problem: Self-Concept: Goal: Will verbalize positive feelings about self Outcome: Progressing Goal: Level of anxiety will decrease Outcome: Progressing

## 2019-12-29 NOTE — Plan of Care (Signed)
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
## 2019-12-29 NOTE — Progress Notes (Signed)
Patient has been calm and cooperative. Denies Si, HI and AVH. Complaining of some itching on her chest and arms. They are red but no hives visible hives. No withdrawal symptoms.Order for Benadryl obtained and given. No further complaints.

## 2019-12-29 NOTE — BHH Group Notes (Signed)
[place] LCSW Group Therapy Note  12/29/19 12:50 PM - 1:28 PM  Type of Therapy and Topic:  Group Therapy:  Adding Supports Including Being Your Own Support  Participation Level:  Minimal  Description of Group:  Patients in this group were introduced to the concept that additional supports including self-support are an essential part of recovery.  A song entitled "I Need Help!" was played and a group discussion was held in reaction to the idea of needing to add supports.  A song entitled "My Own Hero" was played and a group discussion ensued in which patients stated they could relate to the song and it inspired them to realize they have be willing to help themselves in order to succeed, because other people cannot achieve sobriety or stability for them.  We discussed adding a variety of healthy supports to address the various needs in their lives.  A song was played called "I Know Where I've Been" toward the end of group and used to conduct an inspirational wrap-up to group of remembering how far they have already come in their journey.  Therapeutic Goals: 1)  demonstrate the importance of being a part of one's own support system 2)  discuss reasons people in one's life may eventually be unable to be continually supportive  3)  identify the patient's current support system and explore how support has been impacted with [id number]  4)  elicit commitments to add healthy supports and to become more conscious of being self-supportive   Summary of Patient Progress:  The patient expressed her kids are her supports currently. Patient was primarily listening in group but was present. Patient shared she plans to add therapy as a support upon discharge.   Therapeutic Modalities:   Motivational Interviewing Music Therapy   Rise Mu, 

## 2019-12-30 NOTE — Plan of Care (Signed)

## 2019-12-30 NOTE — Plan of Care (Signed)
D- Patient alert and oriented. Patient presents in a pleasant mood on assessment stating that she slept better last night and had no complaints to voice to this Probation officer. Patient denies SI, HI, AVH, and pain at this time. Patient also denies any signs/symptoms of depression/anxiety, reporting that "it's fine". Patient's goal for today is to get "meds correct", in which she will "talk to doctor", in order to achieve her goal.  A- Scheduled medications administered to patient, per MD orders. Support and encouragement provided.  Routine safety checks conducted every 15 minutes.  Patient informed to notify staff with problems or concerns.  R- No adverse drug reactions noted. Patient contracts for safety at this time. Patient compliant with medications and treatment plan. Patient receptive, calm, and cooperative. Patient interacts well with others on the unit.  Patient remains safe at this time.  Problem: Education: Goal: Utilization of techniques to improve thought processes will improve Outcome: Progressing Goal: Knowledge of the prescribed therapeutic regimen will improve Outcome: Progressing   Problem: Activity: Goal: Interest or engagement in leisure activities will improve Outcome: Progressing Goal: Imbalance in normal sleep/wake cycle will improve Outcome: Progressing   Problem: Coping: Goal: Coping ability will improve Outcome: Progressing Goal: Will verbalize feelings Outcome: Progressing   Problem: Health Behavior/Discharge Planning: Goal: Ability to make decisions will improve Outcome: Progressing Goal: Compliance with therapeutic regimen will improve Outcome: Progressing   Problem: Role Relationship: Goal: Will demonstrate positive changes in social behaviors and relationships Outcome: Progressing   Problem: Safety: Goal: Ability to disclose and discuss suicidal ideas will improve Outcome: Progressing Goal: Ability to identify and utilize support systems that promote safety  will improve Outcome: Progressing   Problem: Self-Concept: Goal: Will verbalize positive feelings about self Outcome: Progressing Goal: Level of anxiety will decrease Outcome: Progressing

## 2019-12-30 NOTE — Progress Notes (Signed)
Patient refused her hs dose of zyprexa because she was concerned about having another allergic reaction. She said she remembers not being able to take zyprexa in the past. When told that her cymbalta had been discontinued she says she feels that it was not the problem because she has taken that before without any issues. Denies SI, HI and AVH

## 2019-12-30 NOTE — Plan of Care (Signed)
Patient observed interacting appropriately with peers this evening by this Probation officer.   Problem: Activity: Goal: Interest or engagement in leisure activities will improve Outcome: Progressing

## 2019-12-30 NOTE — Progress Notes (Signed)
Patient ID: Adriana Hall, female   DOB: [birth year], 49 y.o.   MRN: [id number]    Adriana Anne Fu MD Brief Progress Note  Patient remains on unit  We still have to wait at least one day before restarting a new medicine Lamictal low dose, trazodone, Cymbalta and pamelor low dose caused rash  --still not resolved    She is taking benadryl prn she is calming down from general issues and awaits new plan in am   Told we need one to 3 days still   Brief MS  Alert cooperative Strangeness somewhat better Sleep, ADL's cognition okay --no active SI HI or plans now  Mood still up and down, and she feels anxious and nervous along with some elements of depression Contracts for safety at this point  Awaits plan from husband on new apartment away from Mom   Am followup

## 2019-12-30 NOTE — Progress Notes (Signed)
Recreation Therapy Notes  Date: 12/30/2019  Time: 9:30 am   Location: Craft room     Behavioral response: N/A   Intervention Topic: Relaxation   Discussion/Intervention: Patient did not attend group.   Clinical Observations/Feedback:  Patient did not attend group.   Valree Feild LRT/CTRS        Jarome Trull 12/30/2019 11:44 AM

## 2019-12-30 NOTE — Progress Notes (Signed)
Patient pleasant during assessment denying SI/HI/AVH but did endorse anxiety and pain (7/10) See MAR. Patient complaint with medication administration per MD orders. Patient observed by this Probation officer interacting appropriately with staff and peers on the unit. Patient given education, support and encouragement to be active in her treatment plan. Patient being monitored Q 15 minutes for safety per unit protocol. Patient remains safe on the unit.

## 2019-12-31 DIAGNOSIS — F4310 Post-traumatic stress disorder, unspecified: Secondary | ICD-10-CM | POA: Diagnosis not present

## 2019-12-31 MED ORDER — TRAZODONE HCL 100 MG PO TABS
200.0000 mg | ORAL_TABLET | Freq: Every evening | ORAL | Status: DC | PRN
  Administered 2019-12-31: 200 mg via ORAL
  Filled 2019-12-31: qty 2

## 2019-12-31 MED ORDER — DULOXETINE HCL 30 MG PO CPEP
30.0000 mg | ORAL_CAPSULE | Freq: Every day | ORAL | Status: DC
  Administered 2019-12-31 – 2020-01-01 (×2): 30 mg via ORAL
  Filled 2019-12-31 (×2): qty 1

## 2019-12-31 MED ORDER — ALPRAZOLAM 0.5 MG PO TABS
0.2500 mg | ORAL_TABLET | Freq: Two times a day (BID) | ORAL | Status: DC | PRN
  Administered 2019-12-31: 0.25 mg via ORAL
  Filled 2019-12-31: qty 1

## 2019-12-31 NOTE — Plan of Care (Signed)
D- Patient alert and oriented. Patient presented in an anxious, but pleasant mood on assessment stating that she didn't sleep a wink last night because there was "too much drama in here". Patient rated her depression and anxiety a "2/10", because of the drama. Patient denies SI, HI, AVH, and pain at this time stating "my back is feeling pretty good right now". Patient's goal for today is to go home.  A- Scheduled medications administered to patient, per MD orders. Support and encouragement provided.  Routine safety checks conducted every 15 minutes.  Patient informed to notify staff with problems or concerns.  R- No adverse drug reactions noted. Patient contracts for safety at this time. Patient compliant with medications and treatment plan. Patient receptive, calm, and cooperative. Patient interacts well with others on the unit.  Patient remains safe at this time.  Problem: Education: Goal: Utilization of techniques to improve thought processes will improve Outcome: Progressing Goal: Knowledge of the prescribed therapeutic regimen will improve Outcome: Progressing   Problem: Activity: Goal: Interest or engagement in leisure activities will improve Outcome: Progressing Goal: Imbalance in normal sleep/wake cycle will improve Outcome: Progressing   Problem: Coping: Goal: Coping ability will improve Outcome: Progressing Goal: Will verbalize feelings Outcome: Progressing   Problem: Health Behavior/Discharge Planning: Goal: Ability to make decisions will improve Outcome: Progressing Goal: Compliance with therapeutic regimen will improve Outcome: Progressing   Problem: Role Relationship: Goal: Will demonstrate positive changes in social behaviors and relationships Outcome: Progressing   Problem: Safety: Goal: Ability to disclose and discuss suicidal ideas will improve Outcome: Progressing Goal: Ability to identify and utilize support systems that promote safety will improve Outcome:  Progressing   Problem: Self-Concept: Goal: Will verbalize positive feelings about self Outcome: Progressing Goal: Level of anxiety will decrease Outcome: Progressing

## 2019-12-31 NOTE — BHH Suicide Risk Assessment (Signed)
[facility] INPATIENT:  Family/Significant Other Suicide Prevention Education  Suicide Prevention Education:  Contact Attempts: Debria Garret, [phone] has been identified by the patient as the family member/significant other with whom the patient will be residing, and identified as the person(s) who will aid the patient in the event of a mental health crisis.  With written consent from the patient, two attempts were made to provide suicide prevention education, prior to and/or following the patient's discharge.  We were unsuccessful in providing suicide prevention education.  A suicide education pamphlet was given to the patient to share with family/significant other.  Date and time of first attempt:12/31/2019 at 3:32PM Date and time of second attempt: Second attempt is needed.  CSW left HIPAA compliant voicemail.  Rozann Lesches 12/31/2019, 3:32 PM

## 2019-12-31 NOTE — Plan of Care (Signed)
Patient complaint with procedures on the unit  Problem: Education: Goal: Knowledge of the prescribed therapeutic regimen will improve Outcome: Progressing

## 2019-12-31 NOTE — BHH Suicide Risk Assessment (Signed)
[facility] INPATIENT:  Family/Significant Other Suicide Prevention Education  Suicide Prevention Education:  Education Completed; Debria Garret, husband, [phone]; has been identified by the patient as the family member/significant other with whom the patient will be residing, and identified as the person(s) who will aid the patient in the event of a mental health crisis (suicidal ideations/suicide attempt).  With written consent from the patient, the family member/significant other has been provided the following suicide prevention education, prior to the and/or following the discharge of the patient.  The suicide prevention education provided includes the following:  Suicide risk factors  Suicide prevention and interventions  National Suicide Hotline telephone number  [facility] assessment telephone number  [facility] Emergency Assistance [place] and/or Residential Mobile Crisis Unit telephone number  Request made of family/significant other to:  Remove weapons (e.g., guns, rifles, knives), all items previously/currently identified as safety concern.    Remove drugs/medications (over-the-counter, prescriptions, illicit drugs), all items previously/currently identified as a safety concern.  The family member/significant other verbalizes understanding of the suicide prevention education information provided.  The family member/significant other agrees to remove the items of safety concern listed above.  Pt's husband reports "I'll be honest, environment" when asked what brought to the patient to the hospital.  He reports that pt's mother has asked that they not return to her home when pt is discharged, which is where they had been staying.  Husband reports that they can stay with his son if needed.  He reports that patient is not a danger to self or others.  He denies patient has access to weapons.    Rozann Lesches 12/31/2019, 4:02 PM

## 2019-12-31 NOTE — Progress Notes (Signed)
Patient pleasant during assessment denying SI/HI/AVH but did endorse anxiety. Patient complaint with medication administration per MD orders. Patient observed by this Probation officer interacting appropriately with staff and peers on the unit. Patient given education, support and encouragement to be active in her treatment plan. Patient being monitored Q 15 minutes for safety per unit protocol. Patient remains safe on the unit.

## 2019-12-31 NOTE — Progress Notes (Signed)
Recreation Therapy Notes   Date: 12/31/2019  Time: 9:30 am  Location: Craft room   Behavioral response: Appropriate  Intervention Topic: Communication  Discussion/Intervention:  Group content today was focused on communication. The group defined communication and ways to communicate with others. Individuals stated reason why communication is important and some reasons to communicate with others. Patients expressed if they thought they were good at communicating with others and ways they could improve their communication skills. The group identified important parts of communication and some experiences they have had in the past with communication. The group participated in the intervention "What is that?", where they had a chance to test out their communication skills and identify ways to improve their communication techniques.  Clinical Observations/Feedback:  Patient came to group and defined communication as understanding others. She stated that she communicated with others by speaking and eye contact. Individual was social with peers and staff while participating in the intervention.  Nikolus Marczak LRT/CTRS         Tobi Groesbeck 12/31/2019 1:07 PM
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
## 2020-01-01 DIAGNOSIS — F4310 Post-traumatic stress disorder, unspecified: Secondary | ICD-10-CM | POA: Diagnosis not present

## 2020-01-01 MED ORDER — DULOXETINE HCL 30 MG PO CPEP: 30.0000 mg | ORAL_CAPSULE | Freq: Every day | ORAL | 0 refills | Status: DC

## 2020-01-01 MED ORDER — OLANZAPINE 2.5 MG PO TABS: 2.5000 mg | ORAL_TABLET | Freq: Every day | ORAL | 0 refills | Status: DC

## 2020-01-01 MED ORDER — PANTOPRAZOLE SODIUM 40 MG PO TBEC: 40.0000 mg | DELAYED_RELEASE_TABLET | Freq: Every day | ORAL | 0 refills | Status: DC

## 2020-01-01 MED ORDER — GABAPENTIN 400 MG PO CAPS: 400.0000 mg | ORAL_CAPSULE | Freq: Three times a day (TID) | ORAL | 0 refills | Status: DC

## 2020-01-01 MED ORDER — TRAZODONE HCL 100 MG PO TABS: 200.0000 mg | ORAL_TABLET | Freq: Every evening | ORAL | 0 refills | Status: DC | PRN

## 2020-01-01 NOTE — Progress Notes (Signed)
Recreation Therapy Notes   Date: 01/01/2020  Time: 9:30 am  Location: Craft room   Behavioral response: Appropriate  Intervention Topic: Coping skills   Discussion/Intervention:  Group content on today was focused on coping skills. The group defined what coping skills are and when they normally use coping skills. Individuals described how they normally cope with thing and the coping skills they normally use. Patients expressed why it is important to cope with things and how not coping with things can affect you. The group participated in the intervention "My coping box" and made coping boxes while adding coping skills they could use in the future to the box. Clinical Observations/Feedback:  Patient came to group and identified meditation as a coping she uses. Participant expressed that coping skills are important to keep things from being bottled up. Individual was social with peers and staff while participating in the intervention.  Adriana Hall LRT/CTRS         [name] 01/01/2020 12:13 PM

## 2020-01-01 NOTE — Progress Notes (Signed)
  [facility] Adult Case Management Discharge Plan :  Will you be returning to the same living situation after discharge:  Yes,  pt says she will be living with her stepson At discharge, do you have transportation home?: Yes,  pt reports husband will pick up Do you have the ability to pay for your medications: No.  Release of information consent forms completed and in the chart;  Patient's signature needed at discharge.  Patient to Follow up at:  Follow-up Information    [facility] Follow up on 12/31/2019.   Specialty: Professional Counselor Why: Please follow up : Walk in hours are M-F 8:30am - 12:00, 1:00pm - 2:30pm Contact information: [place] [id number] [phone]               Next level of care provider has access to [place] and Suicide Prevention discussed: Yes,  Debria Garret, husband  Have you used any form of tobacco in the last 30 days? (Cigarettes, Smokeless Tobacco, Cigars, and/or Pipes): Yes  Has patient been referred to the Quitline?: Patient refused referral  Patient has been referred for addiction treatment: N/A  Yvette Rack, LCSW 01/01/2020, 11:00 AM

## 2020-01-01 NOTE — [facility] Suicide Risk Assessment (Signed)
[facility] Discharge Suicide Risk Assessment   Principal Problem: <principal problem not specified> Discharge Diagnoses: Active Problems:   PTSD (post-traumatic stress disorder)   Total Time spent with patient: 15 minutes  Musculoskeletal: Strength & Muscle Tone: within normal limits Gait & Station: normal Patient leans: N/A  Psychiatric Specialty Exam: Review of Systems  All other systems reviewed and are negative.   Blood pressure (!) 121/59, pulse 73, temperature 98.6 F (37 C), temperature source Oral, resp. rate 17, height 5' 4.5" (1.638 m), weight 86.2 kg, SpO2 98 %.Body mass index is 32.11 kg/m.  General Appearance: Casual  Eye Contact::  Good  Speech:  Normal Rate409  Volume:  Normal  Mood:  Euthymic  Affect:  Congruent  Thought Process:  Coherent and Descriptions of Associations: Intact  Orientation:  Full (Time, Place, and Person)  Thought Content:  Logical  Suicidal Thoughts:  No  Homicidal Thoughts:  No  Memory:  Immediate;   Fair Recent;   Fair Remote;   Fair  Judgement:  Intact  Insight:  Fair  Psychomotor Activity:  Normal  Concentration:  Good  Recall:  Good  Fund of Knowledge:Good  Language: Good  Akathisia:  Negative  Handed:  Right  AIMS (if indicated):     Assets:  Desire for Improvement Housing Resilience Social Support  Sleep:  Number of Hours: 8.25  Cognition: WNL  ADL's:  Intact   Mental Status Per Nursing Assessment::   On Admission:  NA  Demographic Factors:  Caucasian, Low socioeconomic status and Unemployed  Loss Factors: NA  Historical Factors: Impulsivity  Risk Reduction Factors:   Living with another person, especially a relative and Positive social support  Continued Clinical Symptoms:  Bipolar Disorder:   Mixed State  Cognitive Features That Contribute To Risk:  None    Suicide Risk:  Minimal: No identifiable suicidal ideation.  Patients presenting with no risk factors but with morbid ruminations; may be classified  as minimal risk based on the severity of the depressive symptoms   Follow-up Information    [facility] Follow up on 12/31/2019.   Specialty: Professional Counselor Why: Please follow up : Walk in hours are M-F 8:30am - 12:00, 1:00pm - 2:30pm Contact information: [address] [place] [id number] [phone]               Plan Of Care/Follow-up recommendations:  Activity:  ad lib  Sharma Covert, MD 01/01/2020, 9:40 AM

## 2020-01-01 NOTE — Progress Notes (Signed)
D: Pt alert and oriented x 4. Pt denies experiencing any pain, SI/HI, or AVH at this time. Pt reports she will be able to keep herself safe when she returns home.   A: Pt received discharge and medication education/information. Pt belongings were returned and signed for at this time.   R: Pt verbalized understanding of discharge and medication education/information.  Pt escorted to medical mall front lobby where pt was picked up by husband.
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
## 2020-01-01 NOTE — Progress Notes (Signed)
Recreation Therapy Notes  INPATIENT RECREATION TR PLAN  Patient Details Name: Adriana Hall MRN: [id number] DOB: Oct 21, 1970 Today's Date: 01/01/2020  Rec Therapy Plan Is patient appropriate for Therapeutic Recreation?: Yes Treatment times per week: at least 3 Estimated Length of Stay: 5-7 days TR Treatment/Interventions: Group participation (Comment)  Discharge Criteria Pt will be discharged from therapy if:: Discharged Treatment plan/goals/alternatives discussed and agreed upon by:: Patient/family  Discharge Summary Short term goals set: Patient will engage in groups without prompting or encouragement from LRT x3 group sessions within 5 recreation therapy group sessions Short term goals met: Adequate for discharge Progress toward goals comments: Groups attended Which groups?: Coping skills, Communication Reason goals not met: N/A Therapeutic equipment acquired: N/A Reason patient discharged from therapy: Discharge from hospital Pt/family agrees with progress & goals achieved: Yes Date patient discharged from therapy: 01/01/20   Tommi Crepeau 01/01/2020, 2:11 PM

## 2020-01-01 NOTE — Plan of Care (Signed)
  Problem: Group Participation Goal: STG - Patient will engage in groups without prompting or encouragement from LRT x3 group sessions within 5 recreation therapy group sessions Description: STG - Patient will engage in groups without prompting or encouragement from LRT x3 group sessions within 5 recreation therapy group sessions 01/01/2020 1409 by Ernest Haber, LRT Outcome: Adequate for Discharge 01/01/2020 1409 by Ernest Haber, LRT Outcome: Adequate for Discharge

## 2020-01-01 NOTE — Progress Notes (Signed)
Pt denies experiencing any depression/anxiety at this time. Pt denies experiencing any SI/HI/AVH at this time. Pt is pleasant and compliant with medication/treatment. Pt interacts appropriately with staff and peers on the unit. Pt provided with support and encouragement. Pt is safe at this time.

## 2020-03-04 ENCOUNTER — Ambulatory Visit (INDEPENDENT_AMBULATORY_CARE_PROVIDER_SITE_OTHER): Payer: No Payment, Other | Admitting: Psychiatry

## 2020-03-04 ENCOUNTER — Other Ambulatory Visit (HOSPITAL_COMMUNITY): Payer: Self-pay | Admitting: Psychiatry

## 2020-03-04 ENCOUNTER — Telehealth (HOSPITAL_COMMUNITY): Payer: Self-pay | Admitting: *Deleted

## 2020-03-04 ENCOUNTER — Encounter (HOSPITAL_COMMUNITY): Payer: Self-pay | Admitting: Psychiatry

## 2020-03-04 ENCOUNTER — Other Ambulatory Visit: Payer: Self-pay

## 2020-03-04 DIAGNOSIS — F3181 Bipolar II disorder: Secondary | ICD-10-CM

## 2020-03-04 DIAGNOSIS — F419 Anxiety disorder, unspecified: Secondary | ICD-10-CM | POA: Diagnosis not present

## 2020-03-04 MED ORDER — HYDROXYZINE HCL 25 MG PO TABS: 25.0000 mg | ORAL_TABLET | Freq: Three times a day (TID) | ORAL | 1 refills | Status: DC | PRN

## 2020-03-04 MED ORDER — DULOXETINE HCL 30 MG PO CPEP: 30.0000 mg | ORAL_CAPSULE | Freq: Two times a day (BID) | ORAL | 1 refills | Status: DC

## 2020-03-04 MED ORDER — OLANZAPINE 2.5 MG PO TABS: 2.5000 mg | ORAL_TABLET | Freq: Every day | ORAL | 1 refills | Status: DC

## 2020-03-04 MED ORDER — GABAPENTIN 400 MG PO CAPS: 400.0000 mg | ORAL_CAPSULE | Freq: Three times a day (TID) | ORAL | 1 refills | Status: DC

## 2020-03-04 MED ORDER — TRAZODONE HCL 100 MG PO TABS: 200.0000 mg | ORAL_TABLET | Freq: Every evening | ORAL | 1 refills | Status: DC | PRN

## 2020-03-04 MED FILL — TRAZODONE HCL 100 MG TABS: 100 | 30 days supply | Qty: 60 | Fill #0

## 2020-03-04 MED FILL — GABAPENTIN 400 MG CAPSULE: 400 | 30 days supply | Qty: 90 | Fill #0

## 2020-03-04 MED FILL — DULoxetine HCL 30 MG CPEP: 30 | 30 days supply | Qty: 60 | Fill #0

## 2020-03-04 MED FILL — OLANZapine 2.5 MG TABS: 2.5 | 30 days supply | Qty: 30 | Fill #0

## 2020-03-04 MED FILL — hydrOXYzine HCL 25 MG TABS: 25 | 30 days supply | Qty: 90 | Fill #0

## 2020-03-04 NOTE — Telephone Encounter (Signed)
Vitals 5 feet 4.5 inches, 190 lbs, pulse =84, O2 =99 %, 146/69

## 2020-03-04 NOTE — Telephone Encounter (Signed)
Walk in seeking medication today. She was at [facility] over one month ago, got exposed to [place] and had to quarantine and now has been out of meds for several days. She is currently supposed to be taking  Cymbalta 30 mg QD, Trazodone 100 mg HS, Gabapentin 400 mg TID, and Zyprexa 2.5 mg HS. States she is feeling the best she has in a long time. Her husband is with her and they live with her two children and her mom who has dementia and is difficult to live with. She is seeking meds and counseling. She was at [facility] for [place] and has been involved with mental health for many years. She has dx of Bipolar Depression and Anxiety.
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
## 2020-04-01 MED FILL — DULoxetine HCL 30 MG CPEP: 30 | 30 days supply | Qty: 60 | Fill #1

## 2020-04-01 MED FILL — TRAZODONE HCL 100 MG TABS: 100 | 30 days supply | Qty: 60 | Fill #1

## 2020-04-01 MED FILL — OLANZapine 2.5 MG TABS: 2.5 | 30 days supply | Qty: 30 | Fill #1

## 2020-04-01 MED FILL — GABAPENTIN 400 MG CAPSULE: 400 | 30 days supply | Qty: 90 | Fill #1

## 2020-04-01 MED FILL — hydrOXYzine HCL 25 MG TABS: 25 | 30 days supply | Qty: 90 | Fill #1

## 2020-04-07 ENCOUNTER — Ambulatory Visit (HOSPITAL_COMMUNITY): Payer: No Payment, Other | Admitting: Licensed Clinical Social Worker

## 2020-04-07 ENCOUNTER — Other Ambulatory Visit: Payer: Self-pay

## 2020-05-05 ENCOUNTER — Encounter (HOSPITAL_COMMUNITY): Payer: Self-pay | Admitting: Psychiatry

## 2020-05-05 ENCOUNTER — Telehealth (INDEPENDENT_AMBULATORY_CARE_PROVIDER_SITE_OTHER): Payer: No Payment, Other | Admitting: Psychiatry

## 2020-05-05 ENCOUNTER — Other Ambulatory Visit (HOSPITAL_COMMUNITY): Payer: Self-pay | Admitting: Psychiatry

## 2020-05-05 ENCOUNTER — Other Ambulatory Visit: Payer: Self-pay

## 2020-05-05 DIAGNOSIS — F419 Anxiety disorder, unspecified: Secondary | ICD-10-CM

## 2020-05-05 DIAGNOSIS — F3181 Bipolar II disorder: Secondary | ICD-10-CM | POA: Diagnosis not present

## 2020-05-05 MED ORDER — DULOXETINE HCL 30 MG PO CPEP: 30.0000 mg | ORAL_CAPSULE | Freq: Two times a day (BID) | ORAL | 1 refills | Status: DC

## 2020-05-05 MED ORDER — OLANZAPINE 2.5 MG PO TABS: 2.5000 mg | ORAL_TABLET | Freq: Every day | ORAL | 1 refills | Status: DC

## 2020-05-05 MED ORDER — HYDROXYZINE HCL 25 MG PO TABS: 25.0000 mg | ORAL_TABLET | Freq: Three times a day (TID) | ORAL | 1 refills | Status: DC | PRN

## 2020-05-05 MED ORDER — TRAZODONE HCL 100 MG PO TABS: 200.0000 mg | ORAL_TABLET | Freq: Every evening | ORAL | 1 refills | Status: DC | PRN

## 2020-05-05 MED ORDER — GABAPENTIN 400 MG PO CAPS: 400.0000 mg | ORAL_CAPSULE | Freq: Three times a day (TID) | ORAL | 1 refills | Status: DC

## 2020-05-05 MED FILL — GABAPENTIN 400 MG CAPSULE: 400 | 30 days supply | Qty: 90 | Fill #0

## 2020-05-05 MED FILL — DULoxetine HCL 30 MG CPEP: 30 | 30 days supply | Qty: 60 | Fill #0

## 2020-05-05 MED FILL — TRAZODONE HCL 100 MG TABS: 100 | 30 days supply | Qty: 60 | Fill #0

## 2020-05-05 MED FILL — OLANZapine 2.5 MG TABS: 2.5 | 30 days supply | Qty: 30 | Fill #0

## 2020-05-05 MED FILL — hydrOXYzine HCL 25 MG TABS: 25 | 30 days supply | Qty: 90 | Fill #0
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
## 2020-05-21 ENCOUNTER — Ambulatory Visit (HOSPITAL_COMMUNITY): Payer: No Payment, Other | Admitting: Licensed Clinical Social Worker

## 2020-06-08 MED FILL — DULoxetine HCL 30 MG CPEP: 30 | 30 days supply | Qty: 60 | Fill #1

## 2020-06-08 MED FILL — hydrOXYzine HCL 25 MG TABS: 25 | 30 days supply | Qty: 90 | Fill #1

## 2020-06-08 MED FILL — OLANZapine 2.5 MG TABS: 2.5 | 30 days supply | Qty: 30 | Fill #1

## 2020-06-08 MED FILL — GABAPENTIN 400 MG CAPSULE: 400 | 30 days supply | Qty: 90 | Fill #1

## 2020-06-08 MED FILL — TRAZODONE HCL 100 MG TABS: 100 | 30 days supply | Qty: 60 | Fill #1

## 2020-07-02 ENCOUNTER — Telehealth (INDEPENDENT_AMBULATORY_CARE_PROVIDER_SITE_OTHER): Payer: No Payment, Other | Admitting: Psychiatry

## 2020-07-02 ENCOUNTER — Other Ambulatory Visit (HOSPITAL_COMMUNITY): Payer: Self-pay | Admitting: Psychiatry

## 2020-07-02 ENCOUNTER — Other Ambulatory Visit: Payer: Self-pay

## 2020-07-02 ENCOUNTER — Encounter (HOSPITAL_COMMUNITY): Payer: Self-pay | Admitting: Psychiatry

## 2020-07-02 DIAGNOSIS — F3181 Bipolar II disorder: Secondary | ICD-10-CM | POA: Diagnosis not present

## 2020-07-02 DIAGNOSIS — F419 Anxiety disorder, unspecified: Secondary | ICD-10-CM | POA: Diagnosis not present

## 2020-07-02 MED ORDER — OLANZAPINE 2.5 MG PO TABS: 2.5000 mg | ORAL_TABLET | Freq: Every day | ORAL | 1 refills | Status: DC

## 2020-07-02 MED ORDER — GABAPENTIN 400 MG PO CAPS: 400.0000 mg | ORAL_CAPSULE | Freq: Three times a day (TID) | ORAL | 1 refills | Status: DC

## 2020-07-02 MED ORDER — DULOXETINE HCL 60 MG PO CPEP: 60.0000 mg | ORAL_CAPSULE | Freq: Every day | ORAL | 1 refills | Status: DC

## 2020-07-02 MED ORDER — TRAZODONE HCL 100 MG PO TABS: 200.0000 mg | ORAL_TABLET | Freq: Every evening | ORAL | 1 refills | Status: DC | PRN

## 2020-07-02 MED ORDER — HYDROXYZINE HCL 25 MG PO TABS: 25.0000 mg | ORAL_TABLET | Freq: Three times a day (TID) | ORAL | 1 refills | Status: DC | PRN
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
## 2020-07-06 MED FILL — OLANZapine 2.5 MG TABS: 2.5 | 30 days supply | Qty: 30 | Fill #0

## 2020-07-06 MED FILL — hydrOXYzine HCL 25 MG TABS: 25 | 30 days supply | Qty: 90 | Fill #0

## 2020-07-06 MED FILL — TRAZODONE HCL 100 MG TABS: 100 | 30 days supply | Qty: 60 | Fill #0

## 2020-07-06 MED FILL — DULoxetine HCL 60 MG CPEP: 60 | 30 days supply | Qty: 30 | Fill #0

## 2020-07-06 MED FILL — GABAPENTIN 400 MG CAPSULE: 400 | 30 days supply | Qty: 90 | Fill #0

## 2020-08-25 ENCOUNTER — Telehealth (HOSPITAL_COMMUNITY): Payer: Federal, State, Local not specified - Other | Admitting: Psychiatry

## 2020-08-27 ENCOUNTER — Telehealth (INDEPENDENT_AMBULATORY_CARE_PROVIDER_SITE_OTHER): Payer: No Payment, Other | Admitting: Psychiatry

## 2020-08-27 ENCOUNTER — Encounter (HOSPITAL_COMMUNITY): Payer: Self-pay | Admitting: Psychiatry

## 2020-08-27 ENCOUNTER — Other Ambulatory Visit: Payer: Self-pay

## 2020-08-27 ENCOUNTER — Other Ambulatory Visit (HOSPITAL_COMMUNITY): Payer: Self-pay | Admitting: Psychiatry

## 2020-08-27 DIAGNOSIS — F3181 Bipolar II disorder: Secondary | ICD-10-CM | POA: Diagnosis not present

## 2020-08-27 DIAGNOSIS — F419 Anxiety disorder, unspecified: Secondary | ICD-10-CM

## 2020-08-27 MED ORDER — OLANZAPINE 2.5 MG PO TABS: 2.5000 mg | ORAL_TABLET | Freq: Every day | ORAL | 1 refills | Status: DC

## 2020-08-27 MED ORDER — HYDROXYZINE HCL 25 MG PO TABS: 25.0000 mg | ORAL_TABLET | Freq: Three times a day (TID) | ORAL | 1 refills | Status: DC | PRN

## 2020-08-27 MED ORDER — DULOXETINE HCL 60 MG PO CPEP: 60.0000 mg | ORAL_CAPSULE | Freq: Every day | ORAL | 1 refills | Status: DC

## 2020-08-27 MED ORDER — TRAZODONE HCL 100 MG PO TABS: 200.0000 mg | ORAL_TABLET | Freq: Every evening | ORAL | 1 refills | Status: DC | PRN

## 2020-08-27 MED ORDER — GABAPENTIN 400 MG PO CAPS: 400.0000 mg | ORAL_CAPSULE | Freq: Three times a day (TID) | ORAL | 1 refills | Status: DC
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
## 2020-09-21 ENCOUNTER — Other Ambulatory Visit: Payer: Self-pay

## 2020-09-21 MED FILL — Olanzapine Tab 2.5 MG: ORAL | 30 days supply | Qty: 30 | Fill #0 | Status: AC

## 2020-09-21 MED FILL — Gabapentin Cap 400 MG: ORAL | 30 days supply | Qty: 90 | Fill #0 | Status: AC

## 2020-09-21 MED FILL — Trazodone HCl Tab 100 MG: ORAL | 30 days supply | Qty: 60 | Fill #0 | Status: AC

## 2020-09-21 MED FILL — Duloxetine HCl Enteric Coated Pellets Cap 60 MG (Base Eq): ORAL | 30 days supply | Qty: 30 | Fill #0 | Status: AC

## 2020-09-21 MED FILL — Hydroxyzine HCl Tab 25 MG: ORAL | 30 days supply | Qty: 90 | Fill #0 | Status: AC

## 2020-10-23 ENCOUNTER — Other Ambulatory Visit: Payer: Self-pay

## 2020-10-23 MED FILL — Olanzapine Tab 2.5 MG: ORAL | 30 days supply | Qty: 30 | Fill #1 | Status: CN

## 2020-10-23 MED FILL — Trazodone HCl Tab 100 MG: ORAL | 30 days supply | Qty: 60 | Fill #1 | Status: AC

## 2020-10-23 MED FILL — Hydroxyzine HCl Tab 25 MG: ORAL | 30 days supply | Qty: 90 | Fill #1 | Status: AC

## 2020-10-23 MED FILL — Gabapentin Cap 400 MG: ORAL | 30 days supply | Qty: 90 | Fill #1 | Status: AC

## 2020-10-23 MED FILL — Duloxetine HCl Enteric Coated Pellets Cap 60 MG (Base Eq): ORAL | 30 days supply | Qty: 30 | Fill #1 | Status: AC

## 2020-10-26 ENCOUNTER — Other Ambulatory Visit: Payer: Self-pay

## 2020-10-27 ENCOUNTER — Telehealth (INDEPENDENT_AMBULATORY_CARE_PROVIDER_SITE_OTHER): Payer: No Payment, Other | Admitting: Psychiatry

## 2020-10-27 ENCOUNTER — Other Ambulatory Visit: Payer: Self-pay

## 2020-10-27 ENCOUNTER — Encounter (HOSPITAL_COMMUNITY): Payer: Self-pay | Admitting: Psychiatry

## 2020-10-27 DIAGNOSIS — F3181 Bipolar II disorder: Secondary | ICD-10-CM

## 2020-10-27 DIAGNOSIS — F419 Anxiety disorder, unspecified: Secondary | ICD-10-CM | POA: Diagnosis not present

## 2020-10-27 MED ORDER — DULOXETINE HCL 30 MG PO CPEP
ORAL_CAPSULE | ORAL | 1 refills | Status: DC
  Filled 2020-10-27 – 2020-11-05 (×2): qty 90, 30d supply, fill #0
  Filled 2020-12-08: qty 90, 30d supply, fill #1

## 2020-10-27 MED ORDER — TRAZODONE HCL 100 MG PO TABS
ORAL_TABLET | ORAL | 1 refills | Status: DC
  Filled 2020-10-27: qty 60, fill #0
  Filled 2020-12-08: qty 60, 30d supply, fill #0
  Filled 2021-02-02: qty 60, 30d supply, fill #1

## 2020-10-27 MED ORDER — HYDROXYZINE HCL 25 MG PO TABS
ORAL_TABLET | Freq: Three times a day (TID) | ORAL | 1 refills | Status: DC | PRN
  Filled 2020-10-27: qty 90, fill #0

## 2020-10-27 MED ORDER — GABAPENTIN 400 MG PO CAPS
ORAL_CAPSULE | ORAL | 1 refills | Status: DC
  Filled 2020-10-27: qty 90, fill #0
  Filled 2020-12-08: qty 90, 30d supply, fill #0
  Filled 2021-02-02: qty 90, 30d supply, fill #1

## 2020-10-27 MED ORDER — OLANZAPINE 2.5 MG PO TABS
ORAL_TABLET | ORAL | 1 refills | Status: DC
  Filled 2020-11-05: qty 30, 30d supply, fill #0
  Filled 2020-12-08: qty 30, 30d supply, fill #1
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
## 2020-11-03 ENCOUNTER — Other Ambulatory Visit: Payer: Self-pay

## 2020-11-05 ENCOUNTER — Other Ambulatory Visit: Payer: Self-pay

## 2020-11-06 ENCOUNTER — Other Ambulatory Visit: Payer: Self-pay

## 2020-11-10 ENCOUNTER — Ambulatory Visit (HOSPITAL_COMMUNITY): Payer: Self-pay

## 2020-11-11 ENCOUNTER — Ambulatory Visit (HOSPITAL_COMMUNITY): Payer: Self-pay

## 2020-11-12 ENCOUNTER — Emergency Department (HOSPITAL_COMMUNITY): Payer: Medicaid Other

## 2020-11-12 ENCOUNTER — Other Ambulatory Visit: Payer: Self-pay

## 2020-11-12 ENCOUNTER — Inpatient Hospital Stay (HOSPITAL_COMMUNITY): Payer: Medicaid Other

## 2020-11-12 ENCOUNTER — Inpatient Hospital Stay (HOSPITAL_COMMUNITY)
Admission: EM | Admit: 2020-11-12 | Discharge: 2020-11-21 | DRG: 871 | Disposition: A | Discharge status: Home / Self Care | Payer: Medicaid Other | Attending: Internal Medicine | Admitting: Internal Medicine

## 2020-11-12 ENCOUNTER — Encounter (HOSPITAL_COMMUNITY): Payer: Self-pay | Admitting: Internal Medicine

## 2020-11-12 DIAGNOSIS — F411 Generalized anxiety disorder: Secondary | ICD-10-CM | POA: Diagnosis present

## 2020-11-12 DIAGNOSIS — E669 Obesity, unspecified: Secondary | ICD-10-CM | POA: Diagnosis present

## 2020-11-12 DIAGNOSIS — Z20822 Contact with and (suspected) exposure to covid-19: Secondary | ICD-10-CM | POA: Diagnosis present

## 2020-11-12 DIAGNOSIS — I471 Supraventricular tachycardia, unspecified: Secondary | ICD-10-CM | POA: Diagnosis present

## 2020-11-12 DIAGNOSIS — Z9889 Other specified postprocedural states: Secondary | ICD-10-CM

## 2020-11-12 DIAGNOSIS — D638 Anemia in other chronic diseases classified elsewhere: Secondary | ICD-10-CM | POA: Diagnosis present

## 2020-11-12 DIAGNOSIS — I10 Essential (primary) hypertension: Secondary | ICD-10-CM | POA: Diagnosis present

## 2020-11-12 DIAGNOSIS — R918 Other nonspecific abnormal finding of lung field: Secondary | ICD-10-CM | POA: Diagnosis present

## 2020-11-12 DIAGNOSIS — R042 Hemoptysis: Secondary | ICD-10-CM | POA: Diagnosis present

## 2020-11-12 DIAGNOSIS — F419 Anxiety disorder, unspecified: Secondary | ICD-10-CM | POA: Diagnosis present

## 2020-11-12 DIAGNOSIS — Z8249 Family history of ischemic heart disease and other diseases of the circulatory system: Secondary | ICD-10-CM

## 2020-11-12 DIAGNOSIS — E1165 Type 2 diabetes mellitus with hyperglycemia: Secondary | ICD-10-CM | POA: Diagnosis present

## 2020-11-12 DIAGNOSIS — Z800 Family history of malignant neoplasm of digestive organs: Secondary | ICD-10-CM

## 2020-11-12 DIAGNOSIS — R627 Adult failure to thrive: Secondary | ICD-10-CM | POA: Diagnosis present

## 2020-11-12 DIAGNOSIS — Z597 Insufficient social insurance and welfare support: Secondary | ICD-10-CM

## 2020-11-12 DIAGNOSIS — F1020 Alcohol dependence, uncomplicated: Secondary | ICD-10-CM | POA: Diagnosis present

## 2020-11-12 DIAGNOSIS — F3181 Bipolar II disorder: Secondary | ICD-10-CM | POA: Diagnosis present

## 2020-11-12 DIAGNOSIS — R590 Localized enlarged lymph nodes: Secondary | ICD-10-CM | POA: Diagnosis present

## 2020-11-12 DIAGNOSIS — I252 Old myocardial infarction: Secondary | ICD-10-CM

## 2020-11-12 DIAGNOSIS — D509 Iron deficiency anemia, unspecified: Secondary | ICD-10-CM | POA: Diagnosis present

## 2020-11-12 DIAGNOSIS — R1310 Dysphagia, unspecified: Secondary | ICD-10-CM | POA: Diagnosis present

## 2020-11-12 DIAGNOSIS — F17210 Nicotine dependence, cigarettes, uncomplicated: Secondary | ICD-10-CM | POA: Diagnosis present

## 2020-11-12 DIAGNOSIS — J9621 Acute and chronic respiratory failure with hypoxia: Secondary | ICD-10-CM | POA: Diagnosis present

## 2020-11-12 DIAGNOSIS — Z833 Family history of diabetes mellitus: Secondary | ICD-10-CM

## 2020-11-12 DIAGNOSIS — I5032 Chronic diastolic (congestive) heart failure: Secondary | ICD-10-CM | POA: Diagnosis present

## 2020-11-12 DIAGNOSIS — E1142 Type 2 diabetes mellitus with diabetic polyneuropathy: Secondary | ICD-10-CM | POA: Diagnosis present

## 2020-11-12 DIAGNOSIS — F1010 Alcohol abuse, uncomplicated: Secondary | ICD-10-CM | POA: Diagnosis present

## 2020-11-12 DIAGNOSIS — J9611 Chronic respiratory failure with hypoxia: Secondary | ICD-10-CM

## 2020-11-12 DIAGNOSIS — Z79899 Other long term (current) drug therapy: Secondary | ICD-10-CM

## 2020-11-12 DIAGNOSIS — F4310 Post-traumatic stress disorder, unspecified: Secondary | ICD-10-CM | POA: Diagnosis present

## 2020-11-12 DIAGNOSIS — E876 Hypokalemia: Secondary | ICD-10-CM | POA: Diagnosis present

## 2020-11-12 DIAGNOSIS — J189 Pneumonia, unspecified organism: Secondary | ICD-10-CM | POA: Diagnosis present

## 2020-11-12 DIAGNOSIS — Z6831 Body mass index (BMI) 31.0-31.9, adult: Secondary | ICD-10-CM

## 2020-11-12 DIAGNOSIS — R0902 Hypoxemia: Secondary | ICD-10-CM

## 2020-11-12 DIAGNOSIS — I351 Nonrheumatic aortic (valve) insufficiency: Secondary | ICD-10-CM | POA: Diagnosis present

## 2020-11-12 DIAGNOSIS — K5903 Drug induced constipation: Secondary | ICD-10-CM | POA: Diagnosis present

## 2020-11-12 DIAGNOSIS — Z720 Tobacco use: Secondary | ICD-10-CM | POA: Diagnosis present

## 2020-11-12 DIAGNOSIS — I472 Ventricular tachycardia: Secondary | ICD-10-CM | POA: Diagnosis not present

## 2020-11-12 DIAGNOSIS — I110 Hypertensive heart disease with heart failure: Secondary | ICD-10-CM | POA: Diagnosis present

## 2020-11-12 DIAGNOSIS — J9601 Acute respiratory failure with hypoxia: Secondary | ICD-10-CM | POA: Diagnosis present

## 2020-11-12 DIAGNOSIS — J90 Pleural effusion, not elsewhere classified: Secondary | ICD-10-CM | POA: Diagnosis present

## 2020-11-12 DIAGNOSIS — Z832 Family history of diseases of the blood and blood-forming organs and certain disorders involving the immune mechanism: Secondary | ICD-10-CM

## 2020-11-12 DIAGNOSIS — A419 Sepsis, unspecified organism: Secondary | ICD-10-CM | POA: Diagnosis present

## 2020-11-12 DIAGNOSIS — I2510 Atherosclerotic heart disease of native coronary artery without angina pectoris: Secondary | ICD-10-CM | POA: Diagnosis present

## 2020-11-12 DIAGNOSIS — T40605A Adverse effect of unspecified narcotics, initial encounter: Secondary | ICD-10-CM | POA: Diagnosis present

## 2020-11-12 DIAGNOSIS — E781 Pure hyperglyceridemia: Secondary | ICD-10-CM | POA: Diagnosis present

## 2020-11-12 DIAGNOSIS — J9600 Acute respiratory failure, unspecified whether with hypoxia or hypercapnia: Secondary | ICD-10-CM

## 2020-11-12 DIAGNOSIS — Z808 Family history of malignant neoplasm of other organs or systems: Secondary | ICD-10-CM

## 2020-11-12 DIAGNOSIS — K219 Gastro-esophageal reflux disease without esophagitis: Secondary | ICD-10-CM | POA: Diagnosis present

## 2020-11-12 DIAGNOSIS — R040 Epistaxis: Secondary | ICD-10-CM | POA: Diagnosis not present

## 2020-11-12 LAB — CBC WITH DIFFERENTIAL/PLATELET
Abs Immature Granulocytes: 0 10*3/uL (ref 0.00–0.07)
Basophils Absolute: 0 10*3/uL (ref 0.0–0.1)
Basophils Relative: 0 %
Eosinophils Absolute: 0.7 10*3/uL — ABNORMAL HIGH (ref 0.0–0.5)
Eosinophils Relative: 3 %
HCT: 36.8 % (ref 36.0–46.0)
Hemoglobin: 10.7 g/dL — ABNORMAL LOW (ref 12.0–15.0)
Lymphocytes Relative: 6 %
Lymphs Abs: 1.3 10*3/uL (ref 0.7–4.0)
MCH: 20.6 pg — ABNORMAL LOW (ref 26.0–34.0)
MCHC: 29.1 g/dL — ABNORMAL LOW (ref 30.0–36.0)
MCV: 70.9 fL — ABNORMAL LOW (ref 80.0–100.0)
Monocytes Absolute: 0.2 10*3/uL (ref 0.1–1.0)
Monocytes Relative: 1 %
Neutro Abs: 19.7 10*3/uL — ABNORMAL HIGH (ref 1.7–7.7)
Neutrophils Relative %: 90 %
Platelets: 505 10*3/uL — ABNORMAL HIGH (ref 150–400)
RBC: 5.19 MIL/uL — ABNORMAL HIGH (ref 3.87–5.11)
RDW: 22.8 % — ABNORMAL HIGH (ref 11.5–15.5)
WBC: 21.9 10*3/uL — ABNORMAL HIGH (ref 4.0–10.5)
nRBC: 0.2 % (ref 0.0–0.2)
nRBC: 2 /100 WBC — ABNORMAL HIGH

## 2020-11-12 LAB — I-STAT VENOUS BLOOD GAS, ED
Acid-Base Excess: 3 mmol/L — ABNORMAL HIGH (ref 0.0–2.0)
Bicarbonate: 27.1 mmol/L (ref 20.0–28.0)
Calcium, Ion: 1.09 mmol/L — ABNORMAL LOW (ref 1.15–1.40)
HCT: 37 % (ref 36.0–46.0)
Hemoglobin: 12.6 g/dL (ref 12.0–15.0)
O2 Saturation: 86 %
Potassium: 3.4 mmol/L — ABNORMAL LOW (ref 3.5–5.1)
Sodium: 138 mmol/L (ref 135–145)
TCO2: 28 mmol/L (ref 22–32)
pCO2, Ven: 40.8 mmHg — ABNORMAL LOW (ref 44.0–60.0)
pH, Ven: 7.43 (ref 7.250–7.430)
pO2, Ven: 51 mmHg — ABNORMAL HIGH (ref 32.0–45.0)

## 2020-11-12 LAB — COMPREHENSIVE METABOLIC PANEL
ALT: 22 U/L (ref 0–44)
AST: 42 U/L — ABNORMAL HIGH (ref 15–41)
Albumin: 2.7 g/dL — ABNORMAL LOW (ref 3.5–5.0)
Alkaline Phosphatase: 180 U/L — ABNORMAL HIGH (ref 38–126)
Anion gap: 15 (ref 5–15)
BUN: 8 mg/dL (ref 6–20)
CO2: 22 mmol/L (ref 22–32)
Calcium: 8.8 mg/dL — ABNORMAL LOW (ref 8.9–10.3)
Chloride: 98 mmol/L (ref 98–111)
Creatinine, Ser: 0.75 mg/dL (ref 0.44–1.00)
GFR, Estimated: >60 mL/min (ref >60)
Glucose, Bld: 129 mg/dL — ABNORMAL HIGH (ref 70–99)
Potassium: 3.4 mmol/L — ABNORMAL LOW (ref 3.5–5.1)
Sodium: 135 mmol/L (ref 135–145)
Total Bilirubin: 1.3 mg/dL — ABNORMAL HIGH (ref 0.3–1.2)
Total Protein: 7.4 g/dL (ref 6.5–8.1)

## 2020-11-12 LAB — RESP PANEL BY RT-PCR (FLU A&B, COVID) ARPGX2
Influenza A by PCR: NEGATIVE
Influenza B by PCR: NEGATIVE
SARS Coronavirus 2 by RT PCR: NEGATIVE

## 2020-11-12 LAB — D-DIMER, QUANTITATIVE: D-Dimer, Quant: 1.67 ug/mL-FEU — ABNORMAL HIGH (ref 0.00–0.50)

## 2020-11-12 LAB — I-STAT ARTERIAL BLOOD GAS, ED
Acid-Base Excess: 0 mmol/L (ref 0.0–2.0)
Bicarbonate: 24.7 mmol/L (ref 20.0–28.0)
Calcium, Ion: 1.16 mmol/L (ref 1.15–1.40)
HCT: 34 % — ABNORMAL LOW (ref 36.0–46.0)
Hemoglobin: 11.6 g/dL — ABNORMAL LOW (ref 12.0–15.0)
O2 Saturation: 92 %
Potassium: 3.2 mmol/L — ABNORMAL LOW (ref 3.5–5.1)
Sodium: 137 mmol/L (ref 135–145)
TCO2: 26 mmol/L (ref 22–32)
pCO2 arterial: 38.2 mmHg (ref 32.0–48.0)
pH, Arterial: 7.419 (ref 7.350–7.450)
pO2, Arterial: 64 mmHg — ABNORMAL LOW (ref 83.0–108.0)

## 2020-11-12 LAB — TROPONIN I (HIGH SENSITIVITY)
Troponin I (High Sensitivity): 11 ng/L (ref <18)
Troponin I (High Sensitivity): 10 ng/L (ref <18)

## 2020-11-12 LAB — GLUCOSE, CAPILLARY: Glucose-Capillary: 156 mg/dL — ABNORMAL HIGH (ref 70–99)

## 2020-11-12 LAB — MRSA PCR SCREENING: MRSA by PCR: NEGATIVE

## 2020-11-12 LAB — HIV ANTIBODY (ROUTINE TESTING W REFLEX): HIV Screen 4th Generation wRfx: NONREACTIVE

## 2020-11-12 LAB — LACTIC ACID, PLASMA
Lactic Acid, Venous: 2.3 mmol/L (ref 0.5–1.9)
Lactic Acid, Venous: 1.6 mmol/L (ref 0.5–1.9)

## 2020-11-12 LAB — PROCALCITONIN: Procalcitonin: 0.91 ng/mL

## 2020-11-12 LAB — BRAIN NATRIURETIC PEPTIDE: B Natriuretic Peptide: 85.6 pg/mL (ref 0.0–100.0)

## 2020-11-12 MED ORDER — DULOXETINE HCL 30 MG PO CPEP
30.0000 mg | ORAL_CAPSULE | Freq: Every day | ORAL | Status: DC
  Filled 2020-11-12 (×2): qty 1

## 2020-11-12 MED ORDER — ARFORMOTEROL TARTRATE 15 MCG/2ML IN NEBU
15.0000 ug | INHALATION_SOLUTION | Freq: Two times a day (BID) | RESPIRATORY_TRACT | Status: DC
  Administered 2020-11-12 – 2020-11-21 (×18): 15 ug via RESPIRATORY_TRACT
  Filled 2020-11-12 (×19): qty 2

## 2020-11-12 MED ORDER — DULOXETINE HCL 60 MG PO CPEP
60.0000 mg | ORAL_CAPSULE | Freq: Every day | ORAL | Status: DC
  Administered 2020-11-13 – 2020-11-21 (×9): 60 mg via ORAL
  Filled 2020-11-12 (×3): qty 2
  Filled 2020-11-12: qty 1
  Filled 2020-11-12 (×3): qty 2
  Filled 2020-11-12 (×3): qty 1

## 2020-11-12 MED ORDER — HYDROXYZINE HCL 25 MG PO TABS
25.0000 mg | ORAL_TABLET | Freq: Three times a day (TID) | ORAL | Status: DC | PRN
  Administered 2020-11-12 – 2020-11-18 (×6): 25 mg via ORAL
  Filled 2020-11-12 (×7): qty 1

## 2020-11-12 MED ORDER — SODIUM CHLORIDE 0.9 % IV SOLN
500.0000 mg | INTRAVENOUS | Status: AC
  Administered 2020-11-12 – 2020-11-16 (×5): 500 mg via INTRAVENOUS
  Filled 2020-11-12 (×5): qty 500

## 2020-11-12 MED ORDER — ACETAMINOPHEN 325 MG PO TABS
650.0000 mg | ORAL_TABLET | Freq: Four times a day (QID) | ORAL | Status: DC | PRN
  Administered 2020-11-12 – 2020-11-18 (×7): 650 mg via ORAL
  Filled 2020-11-12 (×7): qty 2

## 2020-11-12 MED ORDER — SODIUM CHLORIDE 0.9 % IV SOLN
2.0000 g | INTRAVENOUS | Status: DC
  Administered 2020-11-12: 2 g via INTRAVENOUS
  Filled 2020-11-12: qty 20

## 2020-11-12 MED ORDER — IPRATROPIUM BROMIDE 0.02 % IN SOLN
0.5000 mg | Freq: Once | RESPIRATORY_TRACT | Status: AC
  Administered 2020-11-12: 0.5 mg via RESPIRATORY_TRACT

## 2020-11-12 MED ORDER — OLANZAPINE 2.5 MG PO TABS
2.5000 mg | ORAL_TABLET | Freq: Every day | ORAL | Status: DC
  Administered 2020-11-12 – 2020-11-20 (×9): 2.5 mg via ORAL
  Filled 2020-11-12 (×12): qty 1

## 2020-11-12 MED ORDER — GABAPENTIN 400 MG PO CAPS
400.0000 mg | ORAL_CAPSULE | Freq: Three times a day (TID) | ORAL | Status: DC
  Administered 2020-11-12 – 2020-11-21 (×27): 400 mg via ORAL
  Filled 2020-11-12 (×27): qty 1

## 2020-11-12 MED ORDER — SODIUM CHLORIDE 0.9 % IV BOLUS
1000.0000 mL | Freq: Once | INTRAVENOUS | Status: AC
  Administered 2020-11-12: 1000 mL via INTRAVENOUS

## 2020-11-12 MED ORDER — SODIUM CHLORIDE 0.9 % IV SOLN
2.0000 g | Freq: Three times a day (TID) | INTRAVENOUS | Status: DC
  Administered 2020-11-12 – 2020-11-13 (×4): 2 g via INTRAVENOUS
  Filled 2020-11-12 (×4): qty 2

## 2020-11-12 MED ORDER — NICOTINE 14 MG/24HR TD PT24
14.0000 mg | MEDICATED_PATCH | Freq: Every day | TRANSDERMAL | Status: DC
  Administered 2020-11-13 – 2020-11-15 (×3): 14 mg via TRANSDERMAL
  Filled 2020-11-12 (×4): qty 1

## 2020-11-12 MED ORDER — SIMETHICONE 80 MG PO CHEW
160.0000 mg | CHEWABLE_TABLET | Freq: Four times a day (QID) | ORAL | Status: DC | PRN
  Administered 2020-11-13: 160 mg via ORAL
  Filled 2020-11-12 (×3): qty 2

## 2020-11-12 MED ORDER — CHLORHEXIDINE GLUCONATE CLOTH 2 % EX PADS
6.0000 | MEDICATED_PAD | Freq: Every day | CUTANEOUS | Status: DC
  Administered 2020-11-12 – 2020-11-21 (×6): 6 via TOPICAL

## 2020-11-12 MED ORDER — GUAIFENESIN-DM 100-10 MG/5ML PO SYRP
5.0000 mL | ORAL_SOLUTION | ORAL | Status: DC | PRN
  Administered 2020-11-12: 5 mL via ORAL
  Filled 2020-11-12 (×3): qty 5

## 2020-11-12 MED ORDER — IOHEXOL 350 MG/ML SOLN
75.0000 mL | Freq: Once | INTRAVENOUS | Status: AC | PRN
  Administered 2020-11-12: 75 mL via INTRAVENOUS

## 2020-11-12 MED ORDER — REVEFENACIN 175 MCG/3ML IN SOLN
175.0000 ug | Freq: Every day | RESPIRATORY_TRACT | Status: DC
  Administered 2020-11-13 – 2020-11-20 (×8): 175 ug via RESPIRATORY_TRACT
  Filled 2020-11-12 (×9): qty 3

## 2020-11-12 MED ORDER — ACETAMINOPHEN 500 MG PO TABS
1000.0000 mg | ORAL_TABLET | Freq: Once | ORAL | Status: AC
  Administered 2020-11-12: 1000 mg via ORAL

## 2020-11-12 MED ORDER — ONDANSETRON HCL 4 MG/2ML IJ SOLN
INTRAMUSCULAR | Status: AC
  Filled 2020-11-12: qty 2

## 2020-11-12 MED ORDER — DIPHENHYDRAMINE HCL 25 MG PO CAPS
50.0000 mg | ORAL_CAPSULE | Freq: Every day | ORAL | Status: DC
  Administered 2020-11-12 – 2020-11-20 (×9): 50 mg via ORAL
  Filled 2020-11-12 (×9): qty 2

## 2020-11-12 MED ORDER — ACETAMINOPHEN 650 MG RE SUPP: 650.0000 mg | Freq: Four times a day (QID) | RECTAL | Status: DC | PRN

## 2020-11-12 MED ORDER — TRAZODONE HCL 50 MG PO TABS
200.0000 mg | ORAL_TABLET | Freq: Every evening | ORAL | Status: DC | PRN
  Administered 2020-11-12 – 2020-11-20 (×5): 200 mg via ORAL
  Filled 2020-11-12 (×6): qty 4

## 2020-11-12 MED ORDER — METRONIDAZOLE 500 MG/100ML IV SOLN
500.0000 mg | Freq: Three times a day (TID) | INTRAVENOUS | Status: DC
  Administered 2020-11-12 – 2020-11-13 (×4): 500 mg via INTRAVENOUS
  Filled 2020-11-12 (×4): qty 100

## 2020-11-12 MED ORDER — ORAL CARE MOUTH RINSE
15.0000 mL | Freq: Two times a day (BID) | OROMUCOSAL | Status: DC
  Administered 2020-11-13 – 2020-11-21 (×12): 15 mL via OROMUCOSAL

## 2020-11-12 MED ORDER — OXYCODONE HCL 5 MG PO TABS
5.0000 mg | ORAL_TABLET | Freq: Four times a day (QID) | ORAL | Status: DC | PRN
  Administered 2020-11-12 – 2020-11-14 (×7): 5 mg via ORAL
  Filled 2020-11-12 (×7): qty 1

## 2020-11-12 MED ORDER — ALBUTEROL SULFATE HFA 108 (90 BASE) MCG/ACT IN AERS
8.0000 | INHALATION_SPRAY | Freq: Once | RESPIRATORY_TRACT | Status: AC
  Administered 2020-11-12: 8 via RESPIRATORY_TRACT

## 2020-11-12 MED ORDER — BUPRENORPHINE HCL-NALOXONE HCL 8-2 MG SL SUBL
1.0000 | SUBLINGUAL_TABLET | Freq: Two times a day (BID) | SUBLINGUAL | Status: DC
  Administered 2020-11-12: 1 via SUBLINGUAL
  Filled 2020-11-12 (×3): qty 1

## 2020-11-12 MED ORDER — ALBUTEROL SULFATE (2.5 MG/3ML) 0.083% IN NEBU
2.5000 mg | INHALATION_SOLUTION | RESPIRATORY_TRACT | Status: DC | PRN
  Administered 2020-11-12 – 2020-11-14 (×4): 2.5 mg via RESPIRATORY_TRACT
  Filled 2020-11-12 (×4): qty 3

## 2020-11-12 MED ORDER — VANCOMYCIN HCL 1750 MG/350ML IV SOLN
1750.0000 mg | Freq: Every day | INTRAVENOUS | Status: DC
  Administered 2020-11-12 – 2020-11-13 (×2): 1750 mg via INTRAVENOUS
  Filled 2020-11-12 (×2): qty 350

## 2020-11-12 MED ORDER — IOHEXOL 300 MG/ML  SOLN
75.0000 mL | Freq: Once | INTRAMUSCULAR | Status: AC | PRN
  Administered 2020-11-12: 75 mL via INTRAVENOUS

## 2020-11-12 MED ORDER — SODIUM CHLORIDE 0.9 % IV SOLN
INTRAVENOUS | Status: DC | PRN
  Administered 2020-11-12: 500 mL via INTRAVENOUS

## 2020-11-12 MED ORDER — PANTOPRAZOLE SODIUM 40 MG PO TBEC
40.0000 mg | DELAYED_RELEASE_TABLET | Freq: Every day | ORAL | Status: DC
  Administered 2020-11-13 – 2020-11-15 (×3): 40 mg via ORAL
  Filled 2020-11-12 (×3): qty 1

## 2020-11-12 MED ORDER — POTASSIUM CHLORIDE CRYS ER 20 MEQ PO TBCR
20.0000 meq | EXTENDED_RELEASE_TABLET | Freq: Once | ORAL | Status: AC
  Administered 2020-11-12: 20 meq via ORAL
  Filled 2020-11-12: qty 1
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
## 2020-11-12 NOTE — Progress Notes (Signed)
[place] Progress Note Patient Name: Adriana Hall DOB: 1970/11/12 MRN: [id number]   Date of Service  11/12/2020  HPI/Events of Note  Patient requesting a Nicoderm patch and a cough suppressant.  eICU Interventions  Nicoderm CQ 14 ordered, Robitussin DM ordered.        Kerry Kass [name] 11/12/2020, 10:59 PM
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
## 2020-11-12 NOTE — ED Notes (Signed)
Patient transported to CT 

## 2020-11-12 NOTE — ED Notes (Signed)
Admitting MD paged regarding patient status.

## 2020-11-12 NOTE — Progress Notes (Signed)
  Request seen for evaluation for lung biopsy.  Per  Dr. Erin Fulling = Patient with respiratory failure and new right middle lobe lung mass. Evaluation for perc lung mass biopsy.... She has significant respiratory failure and are trying to avoid intubation for bronchoscopy as she will likely not extubate easily.  Images reviewed by Dr. Kathlene Cote.   He feel findings on CT are likely infection. Presumed COVID however respiratory panel was negative for COVID.  Recommend antibiotic therapy for now and if malignancy is suspected, recommend outpatient workup with PET.  Dr. Erin Fulling made aware via secure chat.   Micaiah Remillard S Kataya Guimont PA-C 11/12/2020 1:46 PM

## 2020-11-12 NOTE — ED Notes (Signed)
Pt unable to provide urine specimen. Water given. External catheter applied.

## 2020-11-12 NOTE — Progress Notes (Signed)
RT assisted with transportation of this pt from ED room 33 to CT scanner while on NRB 15L plus 10L [place]. Pt tolerated well. Pt was then transferred from CT scanner to 2M10. Pt tolerated well with SVS.

## 2020-11-12 NOTE — ED Notes (Signed)
Pt reports shob increasing for several weeks worsening today. Pt sts she became scared that she would not survive the night. Arrives on 10L NBR via EMS from home. Room air sats dropped to 68%. Increased to 96% on 15L NRB. Left sided rales.

## 2020-11-12 NOTE — Progress Notes (Signed)
Pharmacy Antibiotic Note  Adriana Hall is a 50 y.o. female admitted on 11/12/2020 with pneumonia.  Pharmacy has been consulted for Vancomycin and Cefepime dosing.  Plan: Cefepime 2gm IV q8h Vancomycin 1750mg  IV Q 24 hrs. Goal AUC 400-550. Expected AUC: 514 SCr used: 0.9 Will f/u renal function, micro data, and pt's clinical condition Vanc levels prn   Height: 5' 4.5" (163.8 cm) Weight: 81.6 kg (180 lb) IBW/kg (Calculated) : 55.85  Temp (24hrs), Avg:100.9 F (38.3 C), Min:99.6 F (37.6 C), Max:102.2 F (39 C)  Recent Labs  Lab 11/12/20 0211 11/12/20 0232 11/12/20 0355  WBC 21.9*  --   --   CREATININE 0.75  --   --   LATICACIDVEN  --  2.3* 1.6    Estimated Creatinine Clearance: 86 mL/min (by C-G formula based on SCr of 0.75 mg/dL).    Allergies  Allergen Reactions   Biotin     'Makes me extremely sick'   Doxycycline Nausea And Vomiting    Antimicrobials this admission: 6/9 Rocephin x 1 6/9 Azithromycin >> 6/9 Flagyl >> 6/9 Cefepime>>  6/9 Vanc >>   Microbiology results: 6/9 BCx:  6/9 MRSA PCR:   UCx:    Thank you for allowing pharmacy to be a part of this patient's care.  Sherlon Handing, PharmD, BCPS Please see amion for complete clinical pharmacist phone list 11/12/2020 6:48 AM

## 2020-11-12 NOTE — ED Notes (Signed)
Pt oxygen increased to 15L and pt placed on bedpan per pt request. RN back into room to check on patient, pt HR now 140 with RR in the 50's. Pt oxygen saturations are in the mid 80's. Pt now diaphoretic. Placed pt on 6L [place] as well as the 15L NRB with slow improvement in oxygen saturations to 94%. Pt remains tachycardic and tachypneic. RT called to bedside to reassess pt.

## 2020-11-12 NOTE — Progress Notes (Signed)
50 year old lady with longstanding history of heavy smoking and bipolar disorder presented with shortness of breath and was diagnosed with bilateral aspiration/community-acquired pneumonia and new lung mass.  Patient seen and examined this morning in the ED.  She was still in the ED.  She was still on nonrebreather and was complaining of shortness of breath and chest pain with deep inspiration.  Had diminished breath sounds with rhonchi at the left base on lung examination.  Her husband was at the bedside.  I saw her around 8:30 AM.  I was called back to see her at the bedside at 10:30 AM that patient was having worsening respiratory distress.  Respiratory therapist were at the bedside.  She was transitioned to high flow oxygen.  ABGs were ordered.  PCCM was called at bedside.  Patient's bed status was changed from progressive to ICU.  PCCM assumed her care as primary.  Hospitalist service will sign off.
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
## 2020-11-12 NOTE — ED Notes (Signed)
See downtime forms for Triage

## 2020-11-13 ENCOUNTER — Inpatient Hospital Stay (HOSPITAL_COMMUNITY): Payer: Medicaid Other

## 2020-11-13 DIAGNOSIS — F3181 Bipolar II disorder: Secondary | ICD-10-CM

## 2020-11-13 DIAGNOSIS — J189 Pneumonia, unspecified organism: Secondary | ICD-10-CM

## 2020-11-13 LAB — CBC
HCT: 32.2 % — ABNORMAL LOW (ref 36.0–46.0)
Hemoglobin: 9.6 g/dL — ABNORMAL LOW (ref 12.0–15.0)
MCH: 20.8 pg — ABNORMAL LOW (ref 26.0–34.0)
MCHC: 29.8 g/dL — ABNORMAL LOW (ref 30.0–36.0)
MCV: 69.8 fL — ABNORMAL LOW (ref 80.0–100.0)
Platelets: 421 10*3/uL — ABNORMAL HIGH (ref 150–400)
RBC: 4.61 MIL/uL (ref 3.87–5.11)
RDW: 22 % — ABNORMAL HIGH (ref 11.5–15.5)
WBC: 25.8 10*3/uL — ABNORMAL HIGH (ref 4.0–10.5)
nRBC: 0.1 % (ref 0.0–0.2)

## 2020-11-13 LAB — BASIC METABOLIC PANEL
Anion gap: 9 (ref 5–15)
BUN: 8 mg/dL (ref 6–20)
CO2: 28 mmol/L (ref 22–32)
Calcium: 8.9 mg/dL (ref 8.9–10.3)
Chloride: 98 mmol/L (ref 98–111)
Creatinine, Ser: 0.54 mg/dL (ref 0.44–1.00)
GFR, Estimated: >60 mL/min (ref >60)
Glucose, Bld: 129 mg/dL — ABNORMAL HIGH (ref 70–99)
Potassium: 3.4 mmol/L — ABNORMAL LOW (ref 3.5–5.1)
Sodium: 135 mmol/L (ref 135–145)

## 2020-11-13 LAB — URINALYSIS, ROUTINE W REFLEX MICROSCOPIC
Bilirubin Urine: NEGATIVE
Glucose, UA: NEGATIVE mg/dL
Hgb urine dipstick: NEGATIVE
Ketones, ur: 20 mg/dL — AB
Leukocytes,Ua: NEGATIVE
Nitrite: NEGATIVE
Protein, ur: NEGATIVE mg/dL
Specific Gravity, Urine: 1.019 (ref 1.005–1.030)
pH: 6 (ref 5.0–8.0)

## 2020-11-13 LAB — GLUCOSE, CAPILLARY: Glucose-Capillary: 150 mg/dL — ABNORMAL HIGH (ref 70–99)

## 2020-11-13 LAB — PROCALCITONIN: Procalcitonin: 1.44 ng/mL

## 2020-11-13 MED ORDER — BUPRENORPHINE HCL-NALOXONE HCL 8-2 MG SL SUBL
1.0000 | SUBLINGUAL_TABLET | Freq: Once | SUBLINGUAL | Status: AC
  Administered 2020-11-13: 1 via SUBLINGUAL
  Filled 2020-11-13: qty 1

## 2020-11-13 MED ORDER — ONDANSETRON HCL 4 MG/2ML IJ SOLN
4.0000 mg | Freq: Three times a day (TID) | INTRAMUSCULAR | Status: DC | PRN
  Administered 2020-11-15 – 2020-11-20 (×5): 4 mg via INTRAVENOUS
  Filled 2020-11-13 (×5): qty 2

## 2020-11-13 MED ORDER — ENOXAPARIN SODIUM 40 MG/0.4ML IJ SOSY
40.0000 mg | PREFILLED_SYRINGE | INTRAMUSCULAR | Status: DC
  Administered 2020-11-13 – 2020-11-21 (×9): 40 mg via SUBCUTANEOUS
  Filled 2020-11-13 (×9): qty 0.4

## 2020-11-13 MED ORDER — DULOXETINE HCL 30 MG PO CPEP
30.0000 mg | ORAL_CAPSULE | Freq: Every day | ORAL | Status: DC
  Administered 2020-11-13 – 2020-11-20 (×8): 30 mg via ORAL
  Filled 2020-11-13 (×8): qty 1

## 2020-11-13 MED ORDER — ONDANSETRON HCL 4 MG/2ML IJ SOLN
INTRAMUSCULAR | Status: AC
  Administered 2020-11-13: 4 mg via INTRAVENOUS
  Filled 2020-11-13: qty 2

## 2020-11-13 MED ORDER — POTASSIUM CHLORIDE CRYS ER 20 MEQ PO TBCR
40.0000 meq | EXTENDED_RELEASE_TABLET | Freq: Once | ORAL | Status: AC
  Administered 2020-11-13: 40 meq via ORAL
  Filled 2020-11-13: qty 2

## 2020-11-13 MED ORDER — PIPERACILLIN-TAZOBACTAM 3.375 G IVPB
3.3750 g | Freq: Three times a day (TID) | INTRAVENOUS | Status: AC
  Administered 2020-11-13 – 2020-11-20 (×21): 3.375 g via INTRAVENOUS
  Filled 2020-11-13 (×22): qty 50

## 2020-11-13 MED ORDER — WHITE PETROLATUM EX OINT
TOPICAL_OINTMENT | CUTANEOUS | Status: AC
  Filled 2020-11-13: qty 28.35

## 2020-11-13 MED ORDER — BUPRENORPHINE HCL-NALOXONE HCL 2-0.5 MG SL SUBL
1.0000 | SUBLINGUAL_TABLET | Freq: Once | SUBLINGUAL | Status: AC
  Administered 2020-11-13: 1 via SUBLINGUAL
  Filled 2020-11-13: qty 1

## 2020-11-13 MED ORDER — BENZONATATE 100 MG PO CAPS
100.0000 mg | ORAL_CAPSULE | Freq: Three times a day (TID) | ORAL | Status: DC | PRN
  Administered 2020-11-13 (×2): 100 mg via ORAL
  Filled 2020-11-13 (×3): qty 1

## 2020-11-13 MED ORDER — BUPRENORPHINE HCL-NALOXONE HCL 8-2 MG SL SUBL
1.0000 | SUBLINGUAL_TABLET | Freq: Two times a day (BID) | SUBLINGUAL | Status: DC
  Administered 2020-11-14 – 2020-11-21 (×15): 1 via SUBLINGUAL
  Filled 2020-11-13 (×17): qty 1

## 2020-11-13 MED ORDER — POTASSIUM CHLORIDE CRYS ER 20 MEQ PO TBCR
40.0000 meq | EXTENDED_RELEASE_TABLET | Freq: Once | ORAL | Status: AC
Start: 2020-11-13 — End: 2020-11-13
  Administered 2020-11-13: 40 meq via ORAL
  Filled 2020-11-13: qty 2

## 2020-11-13 MED ORDER — OXYMETAZOLINE HCL 0.05 % NA SOLN
1.0000 | Freq: Two times a day (BID) | NASAL | Status: AC
  Administered 2020-11-13 – 2020-11-14 (×3): 1 via NASAL
  Filled 2020-11-13: qty 30

## 2020-11-13 NOTE — Progress Notes (Signed)
Patient was given sublingual suboxone. Patient had it under tongue for aprroximately 2 minutes, states the tablet will not dessolve due to dry mouth and then spits tablet out due to nausea. Order recieved for nausea medication. Patient given nausea medication and states she will try again to take once nausea goes away. Per pharmacy waste the remainder of the pill and give another once nausea subsides since most of pill is still remaining.

## 2020-11-13 NOTE — Progress Notes (Signed)
  Speech Language Pathology Treatment: Dysphagia  Patient Details Name: Adriana Hall MRN: [id number] DOB: [birth year] Today's Date: 11/13/2020 Time: 6734-1937 SLP Time Calculation (min) (ACUTE ONLY): 14 min  Assessment / Plan / Recommendation Clinical Impression  Pt was seen for dysphagia treatment with her husband present. Pt and her nurse reported that the pt has been tolerating the current diet without overt s/sx of aspiration. Pt's nurse denied signs of aspiration with meals or with pills given whole with thin liquids. Pt's dyspnea was improved compared to yesterday's evaluation and O2 needs improved from 30L to 25L. Pt's RR was mostly below 30 during this session with intermittent elevation to the low 30s. Pt tolerated puree solids and thin liquids via straw without overt s/sx of aspiration and oral clearance was adequate. Pt refused more advanced solids for fear of nausea, but she stated that she was amenable to a full liquid diet instead of the clear liquid diet which was ordered on 6/9. SLP will continue to follow pt.    HPI HPI: Pt is a 50 y.o. female  who presented to the ED secondary to shortness of breath and right-sided chest pain ongoing for 4 days prior to admission and reported difficulty swallowing. CTA chest 6/9: Diffuse patchy ground-glass airspace opacities with loculated small right pleural effusion. Question a pulmonary/pleural mass along the right middle lobe. PMH: tobacco abuse, bipolar disorder, WPW syndrome status post radiofrequency ablation in 2008, GERD      SLP Plan  Continue with current plan of care       Recommendations  Diet recommendations: Thin liquid (full liquids) Liquids provided via: Cup;Straw Medication Administration: Whole meds with liquid Supervision: Intermittent supervision to cue for compensatory strategies Compensations: Slow rate;Small sips/bites (rest breaks if dyspneic) Postural Changes and/or Swallow Maneuvers: Seated upright 90 degrees                 Oral Care Recommendations: Oral care BID Follow up Recommendations:  (TBD) SLP Visit Diagnosis: Dysphagia, unspecified (R13.10) Plan: Continue with current plan of care       Adriana Hall, [place], [place] Office number [phone] Pager [place] 11/13/2020, 4:10 PM

## 2020-11-13 NOTE — Progress Notes (Signed)
[facility] Progress Note Patient Name: Adriana Hall Kines DOB: 1971/04/27 MRN: [id number]   Date of Service  11/13/2020  HPI/Events of Note  K+ 3.4  eICU Interventions  E-Link adult electrolyte replacement protocol ordered.        Kerry Kass Cherese Lozano 11/13/2020, 4:24 AM

## 2020-11-13 NOTE — Progress Notes (Signed)
[facility] Progress Note Patient Name: [name] DOB: Feb 21, 1971 MRN: [id number]   Date of Service  11/13/2020  HPI/Events of Note  Patient with dry nasal mucosa and a nose bleed.  eICU Interventions  Afrin nasal mist ordered.        Kerry Kass Griffen Frayne 11/13/2020, 4:36 AM

## 2020-11-13 NOTE — Progress Notes (Signed)
SLP Cancellation Note  Patient Details Name: Adriana Hall MRN: [id number] DOB: 05-May-1971   Cancelled treatment:       Reason Eval/Treat Not Completed: Patient declined, no reason specified (Pt requested that the session be deferred since she tired and finally resting. SLP will follow up later as schedule allows.)  Rosemarie Galvis I. Hardin Negus, [place], [place] Office number [phone] Pager [place] 11/13/2020, 2:46 PM

## 2020-11-13 NOTE — Plan of Care (Signed)

## 2020-11-13 NOTE — Progress Notes (Signed)
NAME:  Adriana Hall, MRN:  [id number], DOB:  1971/04/01, LOS: 1 ADMISSION DATE:  11/12/2020, CONSULTATION DATE:  11/12/20 REFERRING MD:  Doristine Bosworth, CHIEF COMPLAINT:  SOB   History of Present Illness:  Adriana Hall is a 50 year old woman, daily smoker with history of opioid abuse and several weeks of cough with more recent onset of fevers and hemoptysis. She denies weight loss but has loss of appetite over the last month. She reports difficulty swallowing and has poor dentition.   PCCM has been consulted for respiratory failure and abnormal CT chest scan.    She was initially admitted under the hospitalist service to a step down unit but she had an episode of desaturations and diaphoresis with using the bed pan and PCCM was called down to the ER. She was transitioned to [facility] [place] at 100%   Brother with history of pancreatic cancer. Father with brain cancer.   Significant Hospital Events: Including procedures, antibiotic start and stop dates in addition to other pertinent events   6/9 admitted   Interim History / Subjective:  Patient remains on high flow nasal cannula oxygen, complaining of right-sided lower chest pain with deep breathing Continued to have dry cough  Objective   Blood pressure 133/63, pulse 100, temperature 98.7 F (37.1 C), temperature source Oral, resp. rate (!) 23, height 5' 4.5" (1.638 m), weight 81.6 kg, SpO2 97 %.    FiO2 (%):  [100 %] 100 %   Intake/Output Summary (Last 24 hours) at 11/13/2020 1140 Last data filed at 11/13/2020 1000 Gross per 24 hour  Intake 2417.27 ml  Output 1500 ml  Net 917.27 ml   Filed Weights   11/12/20 0357  Weight: 81.6 kg    Examination: General: Middle-age Caucasian female, lying on the bed currently on HHFNC  HENT: moist mucous membranes, poor dentition with multiple caries, no cervical or supraclavicular LAD palpated Lungs: course breath sounds bilaterally, no crackles Cardiovascular: Regular rate and rhythm, no murmur  appreciated Abdomen: soft, non-tender, non-distended, +BS Extremities: no edema, warm Neuro: alert and oriented x 3, moving all extremities Skin: no rashes  Labs/imaging that I have personally reviewed  (right click and "Reselect all SmartList Selections" daily)   CT Chest: mediastinal lymphadenopathy on right, enlarge subcarinal lymph node. Diffuse patchy airspace disease. Mass in right middle lobe. Appearance of pleural studding.  6/10 CMP and CBC reviewed  Resolved Hospital Problem list     Assessment & Plan:  Acute hypoxemic respiratory failure Bilateral multifocal pneumonia Probable lung Mass Mediastinal Adenopathy Continue HHFNC to maintain goal O2 of 92-95% MRSA PCR is negative Respiratory culture is negative so far We will stop vancomycin and Flagyl, change cefepime to Zosyn to cover for anaerobes Continue azithromycin until Legionella antigen resulted CT abdomen and pelvis was negative for acute findings Once the pneumonia is treated patient will need reimaging to rule out lung mass  Sepsis due to Pneumonia Procalcitonin is elevated White count is elevated to 25.8 Continue IV antibiotics Lactate <2 Procalcitonin is elevated  Hypokalemia Continue supplement electrolytes   Bipolar Disorder continue zyprexa continue duloxetine  Anemia Iron deficiency vs chronic disease Anemia work-up is pending  Tobacco Use Continue nicotine patch  Best practice (right click and "Reselect all SmartList Selections" daily)  Diet: Clear liquid diet, advance as tolerated Pain/Anxiety/Delirium protocol (if indicated): n/a VAP protocol (if indicated): n/a DVT prophylaxis: Subcutaneous Heparin GI prophylaxis: N/A Glucose control:  SSI No Central venous access:  N/A Arterial line:  N/A Foley:  N/A Mobility:  bed rest  PT consulted: N/A Last date of multidisciplinary goals of care discussion [Discussed with patient and husband at bedside, wish to be full code] Code Status:   full code Disposition: ICU  Labs   CBC: Recent Labs  Lab 11/12/20 0211 11/12/20 0251 11/12/20 1112 11/13/20 0144  WBC 21.9*  --   --  25.8*  NEUTROABS 19.7*  --   --   --   HGB 10.7* 12.6 11.6* 9.6*  HCT 36.8 37.0 34.0* 32.2*  MCV 70.9*  --   --  69.8*  PLT 505*  --   --  421*    Basic Metabolic Panel: Recent Labs  Lab 11/12/20 0211 11/12/20 0251 11/12/20 1112 11/13/20 0144  NA 135 138 137 135  K 3.4* 3.4* 3.2* 3.4*  CL 98  --   --  98  CO2 22  --   --  28  GLUCOSE 129*  --   --  129*  BUN 8  --   --  8  CREATININE 0.75  --   --  0.54  CALCIUM 8.8*  --   --  8.9   GFR: Estimated Creatinine Clearance: 86 mL/min (by C-G formula based on SCr of 0.54 mg/dL). Recent Labs  Lab 11/12/20 0211 11/12/20 0232 11/12/20 0355 11/12/20 0623 11/13/20 0144  PROCALCITON  --   --   --  0.91 1.44  WBC 21.9*  --   --   --  25.8*  LATICACIDVEN  --  2.3* 1.6  --   --     Liver Function Tests: Recent Labs  Lab 11/12/20 0211  AST 42*  ALT 22  ALKPHOS 180*  BILITOT 1.3*  PROT 7.4  ALBUMIN 2.7*   No results for input(s): LIPASE, AMYLASE in the last 168 hours. No results for input(s): AMMONIA in the last 168 hours.  ABG    Component Value Date/Time   PHART 7.419 11/12/2020 1112   PCO2ART 38.2 11/12/2020 1112   PO2ART 64 (L) 11/12/2020 1112   HCO3 24.7 11/12/2020 1112   TCO2 26 11/12/2020 1112   ACIDBASEDEF 1.0 12/30/2006 1604   O2SAT 92.0 11/12/2020 1112     Coagulation Profile: No results for input(s): INR, PROTIME in the last 168 hours.  Cardiac Enzymes: No results for input(s): CKTOTAL, CKMB, CKMBINDEX, TROPONINI in the last 168 hours.  HbA1C: No results found for: HGBA1C  CBG: Recent Labs  Lab 11/12/20 1256  GLUCAP 156*    Total critical care time: 43 minutes  Performed by: [place] care time was exclusive of separately billable procedures and treating other patients.   Critical care was necessary to treat or prevent imminent or  life-threatening deterioration.   Critical care was time spent personally by me on the following activities: development of treatment plan with patient and/or surrogate as well as nursing, discussions with consultants, evaluation of patient's response to treatment, examination of patient, obtaining history from patient or surrogate, ordering and performing treatments and interventions, ordering and review of laboratory studies, ordering and review of radiographic studies, pulse oximetry and re-evaluation of patient's condition.   Jacky Kindle MD [facility] See Amion for pager If no response to pager, please call [phone] until 7pm After 7pm, Please call E-link [phone]

## 2020-11-14 ENCOUNTER — Inpatient Hospital Stay (HOSPITAL_COMMUNITY): Payer: Medicaid Other

## 2020-11-14 DIAGNOSIS — E876 Hypokalemia: Secondary | ICD-10-CM | POA: Diagnosis present

## 2020-11-14 DIAGNOSIS — A419 Sepsis, unspecified organism: Secondary | ICD-10-CM | POA: Diagnosis present

## 2020-11-14 LAB — BASIC METABOLIC PANEL
Anion gap: 14 (ref 5–15)
BUN: 7 mg/dL (ref 6–20)
CO2: 27 mmol/L (ref 22–32)
Calcium: 8.7 mg/dL — ABNORMAL LOW (ref 8.9–10.3)
Chloride: 94 mmol/L — ABNORMAL LOW (ref 98–111)
Creatinine, Ser: 0.56 mg/dL (ref 0.44–1.00)
GFR, Estimated: >60 mL/min (ref >60)
Glucose, Bld: 177 mg/dL — ABNORMAL HIGH (ref 70–99)
Potassium: 3.7 mmol/L (ref 3.5–5.1)
Sodium: 135 mmol/L (ref 135–145)

## 2020-11-14 LAB — CBC
HCT: 32.3 % — ABNORMAL LOW (ref 36.0–46.0)
Hemoglobin: 9.4 g/dL — ABNORMAL LOW (ref 12.0–15.0)
MCH: 20.1 pg — ABNORMAL LOW (ref 26.0–34.0)
MCHC: 29.1 g/dL — ABNORMAL LOW (ref 30.0–36.0)
MCV: 69.2 fL — ABNORMAL LOW (ref 80.0–100.0)
Platelets: 467 10*3/uL — ABNORMAL HIGH (ref 150–400)
RBC: 4.67 MIL/uL (ref 3.87–5.11)
RDW: 21.9 % — ABNORMAL HIGH (ref 11.5–15.5)
WBC: 23.1 10*3/uL — ABNORMAL HIGH (ref 4.0–10.5)
nRBC: 0 % (ref 0.0–0.2)

## 2020-11-14 LAB — RAPID URINE DRUG SCREEN, HOSP PERFORMED
Amphetamines: NOT DETECTED
Barbiturates: NOT DETECTED
Benzodiazepines: POSITIVE — AB
Cocaine: NOT DETECTED
Opiates: POSITIVE — AB
Tetrahydrocannabinol: POSITIVE — AB

## 2020-11-14 LAB — IRON AND TIBC
Iron: 13 ug/dL — ABNORMAL LOW (ref 28–170)
Saturation Ratios: 4 % — ABNORMAL LOW (ref 10.4–31.8)
TIBC: 290 ug/dL (ref 250–450)
UIBC: 277 ug/dL

## 2020-11-14 LAB — MAGNESIUM: Magnesium: 2 mg/dL (ref 1.7–2.4)

## 2020-11-14 LAB — GLUCOSE, CAPILLARY
Glucose-Capillary: 160 mg/dL — ABNORMAL HIGH (ref 70–99)
Glucose-Capillary: 144 mg/dL — ABNORMAL HIGH (ref 70–99)

## 2020-11-14 LAB — FERRITIN: Ferritin: 93 ng/mL (ref 11–307)

## 2020-11-14 LAB — STREP PNEUMONIAE URINARY ANTIGEN: Strep Pneumo Urinary Antigen: NEGATIVE

## 2020-11-14 MED ORDER — INSULIN ASPART 100 UNIT/ML IJ SOLN
0.0000 [IU] | Freq: Three times a day (TID) | INTRAMUSCULAR | Status: DC
  Administered 2020-11-14: 3 [IU] via SUBCUTANEOUS
  Administered 2020-11-15: 2 [IU] via SUBCUTANEOUS
  Administered 2020-11-15 (×2): 3 [IU] via SUBCUTANEOUS
  Administered 2020-11-16: 2 [IU] via SUBCUTANEOUS
  Administered 2020-11-16 – 2020-11-19 (×9): 3 [IU] via SUBCUTANEOUS
  Administered 2020-11-20: 5 [IU] via SUBCUTANEOUS
  Administered 2020-11-20 – 2020-11-21 (×2): 3 [IU] via SUBCUTANEOUS

## 2020-11-14 MED ORDER — VITAMIN B-12 100 MCG PO TABS
100.0000 ug | ORAL_TABLET | Freq: Every day | ORAL | Status: DC
  Administered 2020-11-14 – 2020-11-21 (×8): 100 ug via ORAL
  Filled 2020-11-14 (×8): qty 1

## 2020-11-14 MED ORDER — MORPHINE SULFATE (PF) 2 MG/ML IV SOLN
2.0000 mg | Freq: Once | INTRAVENOUS | Status: DC
  Filled 2020-11-14: qty 1

## 2020-11-14 MED ORDER — POTASSIUM CHLORIDE CRYS ER 20 MEQ PO TBCR
40.0000 meq | EXTENDED_RELEASE_TABLET | Freq: Once | ORAL | Status: AC
Start: 2020-11-14 — End: 2020-11-14
  Administered 2020-11-14: 40 meq via ORAL
  Filled 2020-11-14: qty 2

## 2020-11-14 MED ORDER — IPRATROPIUM-ALBUTEROL 0.5-2.5 (3) MG/3ML IN SOLN
3.0000 mL | Freq: Four times a day (QID) | RESPIRATORY_TRACT | Status: DC
  Administered 2020-11-14: 3 mL via RESPIRATORY_TRACT
  Filled 2020-11-14: qty 3

## 2020-11-14 MED ORDER — METHYLPREDNISOLONE SODIUM SUCC 125 MG IJ SOLR
60.0000 mg | Freq: Two times a day (BID) | INTRAMUSCULAR | Status: DC
  Administered 2020-11-14 – 2020-11-17 (×6): 60 mg via INTRAVENOUS
  Filled 2020-11-14 (×6): qty 2

## 2020-11-14 MED ORDER — TAB-A-VITE/IRON PO TABS
1.0000 | ORAL_TABLET | Freq: Every day | ORAL | Status: DC
  Administered 2020-11-14 – 2020-11-21 (×8): 1 via ORAL
  Filled 2020-11-14 (×10): qty 1

## 2020-11-14 MED ORDER — IPRATROPIUM-ALBUTEROL 0.5-2.5 (3) MG/3ML IN SOLN: 3.0000 mL | Freq: Four times a day (QID) | RESPIRATORY_TRACT | Status: DC | PRN

## 2020-11-14 MED ORDER — METHYLPREDNISOLONE SODIUM SUCC 125 MG IJ SOLR
125.0000 mg | Freq: Once | INTRAMUSCULAR | Status: AC
  Administered 2020-11-14: 125 mg via INTRAVENOUS
  Filled 2020-11-14: qty 2

## 2020-11-14 MED ORDER — BUDESONIDE 0.25 MG/2ML IN SUSP
0.2500 mg | Freq: Two times a day (BID) | RESPIRATORY_TRACT | Status: DC
  Administered 2020-11-14 – 2020-11-20 (×12): 0.25 mg via RESPIRATORY_TRACT
  Filled 2020-11-14 (×17): qty 2

## 2020-11-14 MED ORDER — KETOROLAC TROMETHAMINE 15 MG/ML IJ SOLN
15.0000 mg | Freq: Four times a day (QID) | INTRAMUSCULAR | Status: AC
  Administered 2020-11-14 – 2020-11-15 (×3): 15 mg via INTRAVENOUS
  Filled 2020-11-14 (×3): qty 1

## 2020-11-14 MED ORDER — FOLIC ACID 1 MG PO TABS
1.0000 mg | ORAL_TABLET | Freq: Every day | ORAL | Status: DC
  Administered 2020-11-14 – 2020-11-21 (×8): 1 mg via ORAL
  Filled 2020-11-14 (×8): qty 1

## 2020-11-14 NOTE — Progress Notes (Signed)
[facility] Progress Note Patient Name: Adriana Hall DOB: 1970-11-06 MRN: [id number]   Date of Service  11/14/2020  HPI/Events of Note  Patient with acute onset of respiratory difficulty with coughing and increased work of breathing, bedside RN reports widespread wheezing.  eICU Interventions  Solumedrol 125 mg iv x 1, Albuterol nebs, stat portable CXR.        Kerry Kass Nadine Ryle 11/14/2020, 2:40 AM

## 2020-11-14 NOTE — Progress Notes (Signed)
K 3.7 Electrolytes replaced per [facility] electrolyte replacement protocol

## 2020-11-14 NOTE — Progress Notes (Addendum)
NAME:  Adriana Hall, MRN:  [id number], DOB:  1970-10-31, LOS: 2 ADMISSION DATE:  11/12/2020, CONSULTATION DATE:  11/12/20 REFERRING MD:  Doristine Bosworth, CHIEF COMPLAINT:  SOB   History of Present Illness:  Adriana Hall is a 50 year old woman, daily smoker with history of opioid abuse and several weeks of cough with more recent onset of fevers and hemoptysis.   She was initially admitted under the hospitalist service to a step down unit but she had an episode of desaturations and diaphoresis with using the bed pan and. She was transitioned to [facility] [place] at 100% PCCM has been consulted for respiratory failure and abnormal CT chest scan.    Significant Hospital Events:  6/9 admitted 6/11 brief run of non-sustained VT overnight. she remains on high dose supplemental oxygen but states she feels improved   Interim History / Subjective:  Seen sitting up in bed in no acute distress  Family at bedside and updated   Objective   Blood pressure (!) 145/75, pulse (!) 116, temperature 97.8 F (36.6 C), temperature source Oral, resp. rate (!) 31, height 5' 4.5" (1.638 m), weight 81.6 kg, SpO2 93 %.    FiO2 (%):  [100 %] 100 %   Intake/Output Summary (Last 24 hours) at 11/14/2020 9562 Last data filed at 11/14/2020 0800 Gross per 24 hour  Intake 724.77 ml  Output 1425 ml  Net -700.23 ml    Filed Weights   11/12/20 0357  Weight: 81.6 kg    Examination: General: Acute on chronically ill appearing deconditioned elderly female lying in bed, in NAD HEENT: [place]/AT, HHFNC in place MM pink/moist, PERRL,  Neuro: Alert and oriented x3, non-focal CV: s1s2 regular rate and rhythm, no murmur, rubs, or gallops,  PULM:  Diminished air entry bilaterally, sentences are slightly fragmented, on 25L HHFNC GI: soft, bowel sounds active in all 4 quadrants, non-tender, non-distended Extremities: warm/dry, no edema  Skin: no rashes or lesions  Labs/imaging that I have personally reviewed   6/9 CT Chest: mediastinal  lymphadenopathy on right, enlarge subcarinal lymph node. Diffuse patchy airspace disease. Mass in right middle lobe. Appearance of pleural studding. 6/11 CXR Mixed interstitial airspace opacities throughout both lungs, right greater than left with more masslike opacity in the right mid lung. Right pleural effusion.  Resolved Hospital Problem list     Assessment & Plan:  Acute hypoxemic respiratory failure -Secondary to PNA Bilateral multifocal pneumonia Probable lung Mass Mediastinal Adenopathy -MRSA PCR is negative, CT abdomen and pelvis was negative for acute findings P: Continue Azithromycin and Zosyn  Continue HHFNC for SPO2 goal 92-95% Will need repeat imagine once PNA fully treated  Encourage pulmonary hygiene  Mobilize as able  Will discuss with attending benefit of adding steroids   Sepsis due to Pneumonia -Procalcitonin is elevated at 1.44 -WBC remains elevated with peak of 2538 P: Remains in ICU Supplemental oxygen as above  Follow Pan cultures  Continue Aithro and Zosyn Encourage oral hydration  Monitor urine output  Hypokalemia - improved P: Trend Bmet  Supplement as needed   Bipolar Disorder P: Continue Zyprexa and Duloxetine   Anemia Iron deficiency P: Trend CBC  Transfuse per protocol  Hgb goal greater then 7 Supplement iron   Tobacco Use Alcohol use -Reports she drinks 1-2 beers per week P: Continue nicotine patch  Cessation education when appropriate  No hx of alcohol withdrawal symptoms   Best practice   Diet: Clear full diet, advance as tolerated Pain/Anxiety/Delirium protocol (if indicated):  n/a VAP protocol (if indicated): n/a DVT prophylaxis: Subcutaneous Heparin GI prophylaxis: N/A Glucose control:  SSI No Central venous access:  N/A Arterial line:  N/A Foley:  N/A Mobility:  bed rest  PT consulted: N/A Last date of multidisciplinary goals of care discussion Update family daily  Code Status:  full code Disposition:  ICU  CRITICAL CARE Performed by: Johnsie Cancel  Total critical care time: 37 minutes  Critical care time was exclusive of separately billable procedures and treating other patients.  Critical care was necessary to treat or prevent imminent or life-threatening deterioration.  Critical care was time spent personally by me on the following activities: development of treatment plan with patient and/or surrogate as well as nursing, discussions with consultants, evaluation of patient's response to treatment, examination of patient, obtaining history from patient or surrogate, ordering and performing treatments and interventions, ordering and review of laboratory studies, ordering and review of radiographic studies, pulse oximetry and re-evaluation of patient's condition.  Johnsie Cancel, NP-C [facility] Personal contact information can be found on Amion  11/14/2020, 9:50 AM    Pulmonary critical care attending:  50 year old female, daily smoker history of prior opiate abuse, on Suboxone.  Presents with respiratory failure hypoxemia requiring heated high flow nasal cannula.  CT scan of the chest reveals diffuse bilateral groundglass opacities, interlobular septal thickening and masslike density and consolidation within the right middle lobe.  Talking to the patient this morning in detail about her drug use history she does not crush tablets or smoke illicit drugs except for marijuana daily.  BP (!) 145/75   Pulse (!) 116   Temp 97.8 F (36.6 C) (Oral)   Resp (!) 31   Ht 5' 4.5" (1.638 m)   Wt 81.6 kg   SpO2 93%   BMI 30.42 kg/m   General: Middle-aged female sitting up in chair on heated high flow nasal cannula HEENT: Tracking appropriately Heart: Regular rate rhythm S1-S2  Lungs: Inspiratory crackles, midlung zone squeaks,, inspiratory squak  Labs: Reviewed CT chest: Reviewed, right middle lobe density unsure if this is a mass or dense consolidation.  There  is lateral portion of that with an air bronchogram.  She is a longstanding smoker we could be dealing with a primary lung cancer however the rest of her lungs revealed diffuse GGO and interlobular septal thickening. The patient's images have been independently reviewed by me.    Assessment: Acute hypoxemic respiratory failure requiring heated high flow nasal cannula Bilateral multifocal diffuse parenchymal lung disease Suspect a underlying interstitial process that now has exacerbated plus a right middle lobe consolidation/mass with associated adenopathy.  The adenopathy very well may be reactive Multifocal pneumonia Current smoker Marijuana use History of opiate abuse IBS/opiate induced constipation  Plan: Complete 5 days of azithromycin Complete 7 days of Zosyn Start Solu-Medrol 60 mg every 12 Be more aggressive with titrating down her oxygen This was discussed with nursing staff She will have 0 reserve so any movement or strenuous exercise she will drop her sats.  Just give her time to recover before immediately increasing FiO2.  Remains in the ICU for close respiratory observation and risk of decline.  At this point on 25 L and 100%.  Next step would be intubation mechanical ventilation.  This patient is critically ill with multiple organ system failure; which, requires frequent high complexity decision making, assessment, support, evaluation, and titration of therapies. This was completed through the application of advanced monitoring technologies and extensive  interpretation of multiple databases. During this encounter critical care time was devoted to patient care services described in this note for 32 minutes.  Gar[place] Nash, DO [facility] 11/14/2020 12:51 PM

## 2020-11-15 LAB — GLUCOSE, CAPILLARY
Glucose-Capillary: 162 mg/dL — ABNORMAL HIGH (ref 70–99)
Glucose-Capillary: 128 mg/dL — ABNORMAL HIGH (ref 70–99)
Glucose-Capillary: 163 mg/dL — ABNORMAL HIGH (ref 70–99)
Glucose-Capillary: 161 mg/dL — ABNORMAL HIGH (ref 70–99)

## 2020-11-15 LAB — URINE CULTURE: Culture: NO GROWTH

## 2020-11-15 MED ORDER — NITROGLYCERIN 0.4 MG/SPRAY TL SOLN: 1.0000 | Freq: Once | Status: DC | PRN

## 2020-11-15 MED ORDER — ASPIRIN 81 MG PO CHEW
324.0000 mg | CHEWABLE_TABLET | Freq: Once | ORAL | Status: AC
  Administered 2020-11-15: 324 mg via ORAL
  Filled 2020-11-15: qty 4

## 2020-11-15 MED ORDER — ENSURE ENLIVE PO LIQD
237.0000 mL | Freq: Two times a day (BID) | ORAL | Status: DC
  Administered 2020-11-18 – 2020-11-21 (×6): 237 mL via ORAL

## 2020-11-15 NOTE — Progress Notes (Addendum)
NAME:  Adriana Hall, MRN:  [id number], DOB:  15-May-1971, LOS: 3 ADMISSION DATE:  11/12/2020, CONSULTATION DATE:  11/12/20 REFERRING MD:  Doristine Bosworth, CHIEF COMPLAINT:  SOB   History of Present Illness:  Adriana Hall is a 50 year old woman, daily smoker with history of opioid abuse and several weeks of cough with more recent onset of fevers and hemoptysis.   She was initially admitted under the hospitalist service to a step down unit but she had an episode of desaturations and diaphoresis with using the bed pan and. She was transitioned to [facility] [place] at 100% PCCM has been consulted for respiratory failure and abnormal CT chest scan.    Significant Hospital Events:  6/9 admitted 6/11 brief run of non-sustained VT overnight. she remains on high dose supplemental oxygen but states she feels improved  6/12 Oxygen requirement and subjective dyspnea improved with addition of steroids   Interim History / Subjective:  States she feels bette than yesterday and was able to rest well overnight   Objective   Blood pressure (!) 146/81, pulse 95, temperature 98.2 F (36.8 C), temperature source Oral, resp. rate (!) 25, height 5' 4.5" (1.638 m), weight 81.6 kg, SpO2 94 %.    FiO2 (%):  [60 %-100 %] 60 %   Intake/Output Summary (Last 24 hours) at 11/15/2020 0934 Last data filed at 11/15/2020 0900 Gross per 24 hour  Intake 1065.31 ml  Output 915 ml  Net 150.31 ml    Filed Weights   11/12/20 0357  Weight: 81.6 kg    Examination: General: Acute on chronically ill appearing middle aged female sitting up in bed, in NAD HEENT: [place]/AT, MM pink/moist, PERRL,  Neuro: Alert and oriented x3, non-focal  CV: s1s2 regular rate and rhythm, no murmur, rubs, or gallops,  PULM:  Pulmonary squeak left greater than right, remains on HHFNC at 20L, no increased work of breathing  GI: soft, bowel sounds active in all 4 quadrants, non-tender, non-distended Extremities: warm/dry, no edema  Skin: no rashes or  lesions  Labs/imaging that I have personally reviewed   6/9 CT Chest: mediastinal lymphadenopathy on right, enlarge subcarinal lymph node. Diffuse patchy airspace disease. Mass in right middle lobe. Appearance of pleural studding. 6/11 CXR Mixed interstitial airspace opacities throughout both lungs, right greater than left with more masslike opacity in the right mid lung. Right pleural effusion.  Resolved Hospital Problem list   Sepsis   Assessment & Plan:  Acute hypoxemic respiratory failure -Secondary to PNA Bilateral multifocal pneumonia -Procalcitonin is elevated at 1.44 -WBC remains elevated with peak of 25.8 Probable lung Mass Mediastinal Adenopathy -MRSA PCR is negative, CT abdomen and pelvis was negative for acute findings P: Remains on Azith and Zosyn Continue HHFNC, wean as able  Continue high dose steroids Mobilize as pulmonary ability allows Will need repeat imagining once PNA fully treated   Hypokalemia - improved P: Trend Bmet Supplement as needed  Bipolar Disorder Chronic opoid use P: Continue Zyprexa and Duloxetine  Continue home Suboxone  PRN Oxycodone stopped  Anemia Iron deficiency P: Trend CBC Transfuse per protocol  Supplement iron  HGB goal greater then 7  Tobacco Use Alcohol use -Reports she drinks 1-2 beers per week P: Continue nicotine patch  Cessation education when appropriate   Best practice   Diet: Clear full diet, advance as tolerated Pain/Anxiety/Delirium protocol (if indicated): n/a VAP protocol (if indicated): n/a DVT prophylaxis: Subcutaneous Heparin GI prophylaxis: N/A Glucose control:  SSI No Central venous access:  N/A Arterial line:  N/A Foley:  N/A Mobility:  bed rest  PT consulted: N/A Last date of multidisciplinary goals of care discussion Update family daily  Code Status:  full code Disposition: ICU - once oxygen requirement decreases can transfer out of ICU    CRITICAL CARE Performed by: Johnsie Cancel  Total critical care time: 35 minutes  Critical care time was exclusive of separately billable procedures and treating other patients.  Critical care was necessary to treat or prevent imminent or life-threatening deterioration.  Critical care was time spent personally by me on the following activities: development of treatment plan with patient and/or surrogate as well as nursing, discussions with consultants, evaluation of patient's response to treatment, examination of patient, obtaining history from patient or surrogate, ordering and performing treatments and interventions, ordering and review of laboratory studies, ordering and review of radiographic studies, pulse oximetry and re-evaluation of patient's condition.  Johnsie Cancel, NP-C [facility] Personal contact information can be found on Amion  11/15/2020, 9:34 AM   PCCM Attending:  This is a 50 year old female, daily smoker history of prior opiate abuse, Suboxone use.  Presents with respiratory failure severe hypoxemia requiring heated high flow nasal cannula at 1 point on 100% 25 L.  CT scan reveals diffuse bilateral groundglass opacities, interlobular septal thickening and I doubt masslike density when the right middle lobe.  Denied drug abuse.  However does use daily marijuana.  BP (!) 148/76   Pulse 96   Temp 98.2 F (36.8 C) (Oral)   Resp (!) 23   Ht 5' 4.5" (1.638 m)   Wt 81.6 kg   SpO2 91%   BMI 30.42 kg/m   General: Middle-aged female sitting up in chair on heated high flow nasal cannula HEENT: Tracking appropriately Heart: Regular rhythm, S1-S2 lungs: Crackles mid lung zone inspiratory [place].  Labs: Reviewed CT scan: Reviewed  Assessment: Acute hypoxemic respiratory failure requiring heated high flow nasal cannula Bilateral multifocal diffuse parenchymal lung disease Suspect underlying interstitial process at baseline that is now exacerbated.  She also has a right middle lobe  consolidation/mass.  I think the adenopathy appears reactive. She could very well have a lung cancer due to her longstanding history of smoking.  But this will need to follow-up with CT imaging at some point. Multifocal pneumonia from above Current smoker. Daily marijuana use History of IV abuse  Plan: Complete 5 days azithromycin Complete 7 days of Zosyn Continue Solu-Medrol as she is improving with this started yesterday, 60 mg every 12 Continue to titrate FiO2 to maintain sats above 88%.  Remains in the ICU for close observation.  Due to risk of need for mechanical support.  This patient is critically ill with multiple organ system failure; which, requires frequent high complexity decision making, assessment, support, evaluation, and titration of therapies. This was completed through the application of advanced monitoring technologies and extensive interpretation of multiple databases. During this encounter critical care time was devoted to patient care services described in this note for 36 minutes.  [place] Nash, DO [place] 11/15/2020 11:52 AM

## 2020-11-16 ENCOUNTER — Inpatient Hospital Stay (HOSPITAL_COMMUNITY): Payer: Medicaid Other

## 2020-11-16 ENCOUNTER — Encounter (HOSPITAL_COMMUNITY): Payer: Self-pay | Admitting: Pulmonary Disease

## 2020-11-16 LAB — BASIC METABOLIC PANEL
Anion gap: 8 (ref 5–15)
BUN: 17 mg/dL (ref 6–20)
CO2: 34 mmol/L — ABNORMAL HIGH (ref 22–32)
Calcium: 8.7 mg/dL — ABNORMAL LOW (ref 8.9–10.3)
Chloride: 93 mmol/L — ABNORMAL LOW (ref 98–111)
Creatinine, Ser: 0.54 mg/dL (ref 0.44–1.00)
GFR, Estimated: >60 mL/min (ref >60)
Glucose, Bld: 159 mg/dL — ABNORMAL HIGH (ref 70–99)
Potassium: 4.1 mmol/L (ref 3.5–5.1)
Sodium: 135 mmol/L (ref 135–145)

## 2020-11-16 LAB — GLUCOSE, CAPILLARY
Glucose-Capillary: 184 mg/dL — ABNORMAL HIGH (ref 70–99)
Glucose-Capillary: 145 mg/dL — ABNORMAL HIGH (ref 70–99)
Glucose-Capillary: 142 mg/dL — ABNORMAL HIGH (ref 70–99)
Glucose-Capillary: 172 mg/dL — ABNORMAL HIGH (ref 70–99)

## 2020-11-16 LAB — CBC
HCT: 29.5 % — ABNORMAL LOW (ref 36.0–46.0)
Hemoglobin: 8.8 g/dL — ABNORMAL LOW (ref 12.0–15.0)
MCH: 20.9 pg — ABNORMAL LOW (ref 26.0–34.0)
MCHC: 29.8 g/dL — ABNORMAL LOW (ref 30.0–36.0)
MCV: 70.1 fL — ABNORMAL LOW (ref 80.0–100.0)
Platelets: 499 10*3/uL — ABNORMAL HIGH (ref 150–400)
RBC: 4.21 MIL/uL (ref 3.87–5.11)
RDW: 21.9 % — ABNORMAL HIGH (ref 11.5–15.5)
WBC: 16.9 10*3/uL — ABNORMAL HIGH (ref 4.0–10.5)
nRBC: 0 % (ref 0.0–0.2)

## 2020-11-16 LAB — HEMOGLOBIN A1C
Hgb A1c MFr Bld: 6.5 % — ABNORMAL HIGH (ref 4.8–5.6)
Mean Plasma Glucose: 140 mg/dL

## 2020-11-16 LAB — TROPONIN I (HIGH SENSITIVITY)
Troponin I (High Sensitivity): 13 ng/L (ref <18)
Troponin I (High Sensitivity): 15 ng/L (ref <18)

## 2020-11-16 LAB — LEGIONELLA PNEUMOPHILA SEROGP 1 UR AG: L. pneumophila Serogp 1 Ur Ag: NEGATIVE

## 2020-11-16 MED ORDER — SODIUM CHLORIDE 0.9 % IV SOLN
510.0000 mg | INTRAVENOUS | Status: DC
  Administered 2020-11-16: 510 mg via INTRAVENOUS
  Filled 2020-11-16: qty 17

## 2020-11-16 MED ORDER — MIDAZOLAM HCL 2 MG/2ML IJ SOLN
1.0000 mg | Freq: Once | INTRAMUSCULAR | Status: AC
  Administered 2020-11-16: 1 mg via INTRAVENOUS
  Filled 2020-11-16: qty 2

## 2020-11-16 MED ORDER — NICOTINE 21 MG/24HR TD PT24
21.0000 mg | MEDICATED_PATCH | Freq: Every day | TRANSDERMAL | Status: DC
  Administered 2020-11-16 – 2020-11-21 (×6): 21 mg via TRANSDERMAL
  Filled 2020-11-16 (×6): qty 1

## 2020-11-16 MED ORDER — OMEPRAZOLE 20 MG PO CPDR
40.0000 mg | DELAYED_RELEASE_CAPSULE | Freq: Every day | ORAL | Status: DC
  Administered 2020-11-16 – 2020-11-21 (×6): 40 mg via ORAL
  Filled 2020-11-16 (×6): qty 2

## 2020-11-16 NOTE — Progress Notes (Signed)
  Speech Language Pathology Treatment: Dysphagia  Patient Details Name: Tarren Sabree Killman MRN: [id number] DOB: [birth year] Today's Date: 11/16/2020 Time: 5396-7289 SLP Time Calculation (min) (ACUTE ONLY): 10 min  Assessment / Plan / Recommendation Clinical Impression  Pt demonstrates tolerance of upgraded diet. She is ready for salads and more fruits and veggies. Does complain of GERD and wanting to resume her home med regimen. Will upgrade to regular texture solids and sign off.   HPI        SLP Plan  All goals met       Recommendations  Diet recommendations: Regular;Thin liquid Liquids provided via: Cup;Straw Medication Administration: Whole meds with liquid Compensations: Slow rate;Small sips/bites Postural Changes and/or Swallow Maneuvers: Seated upright 90 degrees;Upright 30-60 min after meal                Plan: All goals met       GO               Herbie Baltimore, MA CCC-SLP  Acute Rehabilitation Services Pager [phone] Office [phone]  Lynann Beaver 11/16/2020, 1:31 PM

## 2020-11-16 NOTE — Procedures (Signed)
Thoracentesis  Procedure Note  Adriana Hall  [id number]  [birth year]  Date:11/16/20  Time:1:09 PM   Provider Performing:Everley Evora Cipriano Mile supervising Student Dr. Bernarda Caffey  Procedure: Thoracentesis with imaging guidance [phone])  Indication(s) Pleural Effusion  Consent Risks of the procedure as well as the alternatives and risks of each were explained to the patient and/or caregiver.  Consent for the procedure was obtained and is signed in the bedside chart  Anesthesia Topical only with 1% lidocaine   Time Out Verified patient identification, verified procedure, site/side was marked, verified correct patient position, special equipment/implants available, medications/allergies/relevant history reviewed, required imaging and test results available.  Sterile Technique Maximal sterile technique including full sterile barrier drape, hand hygiene, sterile gown, sterile gloves, mask, hair covering, sterile ultrasound probe cover (if used).  Procedure Description Ultrasound was used to identify appropriate pleural anatomy for placement and overlying skin marked.  Area of drainage cleaned and draped in sterile fashion. Lidocaine was used to anesthetize the skin and subcutaneous tissue.  10 cc's of cloudy debris-filled fluid was drained from the right pleural space. Catheter then removed and bandaid applied to site.  Complications/Tolerance None; patient tolerated the procedure well. Chest X-ray is ordered to confirm no post-procedural complication.  EBL Minimal  Specimen(s) Pleural fluid for cyto, not enough for micro

## 2020-11-16 NOTE — Progress Notes (Addendum)
Pt reports sudden onset of left sided chest discomfort and nausea to night shift RN . Discomfort described as heaviness and tightness. Isabella notified. Repeat EKG obtained and Asprin given per Physician's order. Will continue to monitor.

## 2020-11-16 NOTE — Progress Notes (Signed)
NAME:  Adriana Hall, MRN:  [id number], DOB:  17-Feb-1971, LOS: 4 ADMISSION DATE:  11/12/2020, CONSULTATION DATE:  11/12/20 REFERRING MD:  Doristine Bosworth, CHIEF COMPLAINT:  SOB   History of Present Illness:  Adriana Hall is a 50 year old woman, daily smoker with history of opioid abuse and several weeks of cough with more recent onset of fevers and hemoptysis.   She was initially admitted under the hospitalist service to a step down unit but she had an episode of desaturations and diaphoresis with using the bed pan and. She was transitioned to [facility] at 100% PCCM has been consulted for respiratory failure and abnormal CT chest scan.    Significant Hospital Events:  6/9 admitted 6/11 brief run of non-sustained VT overnight. she remains on high dose supplemental oxygen but states she feels improved  6/12 Oxygen requirement and subjective dyspnea improved with addition of steroids. 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 ASA, nitroglycerin, and Zofran given,  troponin T HS wnl, EKG unchanged 20 minutes later, and pt reported improvement in discomfort.  This AM pt reports headache since Nitro overnight. Feels breathing is subjectively better. Had not seen primary care doctor for years before this hospitalization 2/2 to lack of insurance. Reports about 5 years of intermittent cough and temperature instability (reports night sweats and is perimenopausal w/ LMP about 4 months ago). Reports brown sputum for last 4 months.  No hx TB exposure, incarceration, or sleeping in shelters. Reports no hx IVDU (per chart review IVDU is reported); thinks she had hepatitis testing which was negative in the past.   Reports 60+ pack year smoking hx; using NRT this hospitalization and desires smoking cessation  moving fwd. Also reports right-sided lung mass about 5 years ago which was never followed up on.  Nervous about intubation, thoracentesis.   Objective   Blood pressure (!) 146/78, pulse (!) 103, temperature 97.9 F (36.6 C), temperature source Axillary, resp. rate (!) 21, height 5' 4.5" (1.638 m), weight 81.6 kg, SpO2 96 %.    FiO2 (%):  [50 %-60 %] 50 %   Intake/Output Summary (Last 24 hours) at 11/16/2020 0738 Last data filed at 11/16/2020 0600 Gross per 24 hour  Intake 1098.62 ml  Output 810 ml  Net 288.62 ml   Filed Weights   11/12/20 0357  Weight: 81.6 kg    Examination: General: Acute on chronically ill appearing middle aged female lying in bed, diaphoretic, tearful HEENT: [place]/AT, MM, making tears Neuro: non-focal, moves all 4 extremities spontaneously  CV: s1s2 regular rate and rhythm, no murmur, rubs, or gallops,  PULM:  Remains on HHFNC at 20L, normal WOB, bilateral expiratory wheeze  GI: soft, slightly TTP w/ no rebound or guarding Extremities: warm/dry, no edema  Skin: no rashes or lesions  Labs/imaging that I have personally reviewed   6/9 CT Chest: mediastinal lymphadenopathy on right, enlarge subcarinal lymph node. Diffuse patchy airspace disease. Mass in right middle lobe. Appearance of pleural studding. 6/11 CXR Mixed interstitial airspace opacities throughout both lungs, right greater than left with more masslike opacity in the right mid lung. Right pleural effusion. 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 QT/QTcB 406/515 ms  Labs 6/13:   Cl 93, Ca  8.7,  Co2 34  WBC 16.9   Hgb 8.8, MCV 70.1, plts 499  Trop 13, 15   Micro: BC 6/9 - ngx4  Resolved Hospital Problem list   Sepsis  Hypokalemia  Assessment & Plan:   Acute hypoxemic respiratory failure c/f Bilateral multifocal pneumonia vs malignancy - Procalcitonin is elevated at 1.44 - WBC remains elevated with peak of 25.8 but now downtrending - S/p azithromycin (6/8-6/12)   P: - Follow cultures - Continue Zosyn (anticipate 7 day course) - Continue HHFNC, wean as able  - Continue high dose steroids  - Mobilize as pulmonary ability allows - Work-up for lung mass as below  - Sign out to [facility] for 6/14    Probable lung Mass, Mediastinal Adenopathy c/f Malignancy - MRSA PCR is negative, CT abdomen and pelvis was negative for acute findings - 60+ pack-year smoking hx iso of 5 years of chronic cough, 4 months hemoptysis, and night sweats, leukocytosis, and minimal clinical improvement w/ antibiotics c/f for malignancy P: - Thoracentesis w/ cytology 6/13 - Will need repeat imagining (CT) once PNA fully treated   Bipolar Disorder Chronic opoid use P: Continue Zyprexa and Duloxetine  Continue home Suboxone  PRN Oxycodone stopped  Anemia Iron deficiency P: Trend CBC Transfuse per protocol  Supplement iron  HGB goal greater then 7  Tobacco Use Alcohol use -Reports she drinks 1-2 beers per week, denied hx IVDU - 60= pack year smoking hx, on NRT this admission and desires continued smoking cessation P: Continue nicotine patch  Cessation education when appropriate    Best practice   Diet: Clear full diet, advance as tolerated Pain/Anxiety/Delirium protocol (if indicated): n/a VAP protocol (if indicated): n/a DVT prophylaxis: Subcutaneous Heparin GI prophylaxis: N/A Glucose control:  SSI No Central venous access:  N/A Arterial line:  N/A Foley:  N/A Mobility:  bed rest  PT consulted: N/A Last date of multidisciplinary goals of care discussion Update family daily  Code Status:  full code Disposition: Floor    CRITICAL CARE Performed by: Raliegh Ip, medical student  Total critical care time: 35 minutes

## 2020-11-16 NOTE — Progress Notes (Signed)
eLink Physician-Brief Progress Note Patient Name: Adriana Hall DOB: Mar 01, 1971 MRN: [id number]   Date of Service  11/16/2020  HPI/Events of Note  Patient complained of nausea and sense of chest heaviness which she apparently experiences from time to time outside the hospital.  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 Troponin T HS sent and was 13 (normal). ASA + Zofran was ordered by me and administered to patient while awaiting before-mentioned lab result. Repeat EKG 20 minutes later which remained unchanged.  Patient then reported some spontaneous improvement in her discomfort and then actually fell asleep.   eICU Interventions  Normal troponin and now-resolved symptoms in setting of non-specific EKG findings. Continue to monitor patient and notify MD if symptoms recurs.     Intervention Category Intermediate Interventions: Pain - evaluation and management;Other:  Charlott Rakes 11/16/2020, 12:44 AM

## 2020-11-17 ENCOUNTER — Encounter (HOSPITAL_COMMUNITY): Payer: Self-pay | Admitting: Pulmonary Disease

## 2020-11-17 DIAGNOSIS — I2510 Atherosclerotic heart disease of native coronary artery without angina pectoris: Secondary | ICD-10-CM

## 2020-11-17 DIAGNOSIS — F419 Anxiety disorder, unspecified: Secondary | ICD-10-CM

## 2020-11-17 LAB — RESPIRATORY PANEL BY PCR

## 2020-11-17 LAB — GLUCOSE, CAPILLARY
Glucose-Capillary: 178 mg/dL — ABNORMAL HIGH (ref 70–99)
Glucose-Capillary: 168 mg/dL — ABNORMAL HIGH (ref 70–99)
Glucose-Capillary: 180 mg/dL — ABNORMAL HIGH (ref 70–99)
Glucose-Capillary: 133 mg/dL — ABNORMAL HIGH (ref 70–99)

## 2020-11-17 LAB — CYTOLOGY - NON PAP

## 2020-11-17 LAB — TSH: TSH: 0.506 u[IU]/mL (ref 0.350–4.500)

## 2020-11-17 LAB — CULTURE, BLOOD (ROUTINE X 2)
Culture: NO GROWTH
Culture: NO GROWTH
Special Requests: ADEQUATE

## 2020-11-17 LAB — SEDIMENTATION RATE: Sed Rate: 50 mm/hr — ABNORMAL HIGH (ref 0–22)

## 2020-11-17 LAB — C-REACTIVE PROTEIN: CRP: 8 mg/dL — ABNORMAL HIGH (ref <1.0)

## 2020-11-17 MED ORDER — METHYLPREDNISOLONE SODIUM SUCC 125 MG IJ SOLR
40.0000 mg | Freq: Every day | INTRAMUSCULAR | Status: DC
  Administered 2020-11-17: 40 mg via INTRAVENOUS
  Filled 2020-11-17: qty 2

## 2020-11-17 MED ORDER — SENNOSIDES-DOCUSATE SODIUM 8.6-50 MG PO TABS: 1.0000 | ORAL_TABLET | Freq: Every evening | ORAL | Status: DC | PRN

## 2020-11-17 MED ORDER — FUROSEMIDE 10 MG/ML IJ SOLN
40.0000 mg | Freq: Four times a day (QID) | INTRAMUSCULAR | Status: AC
  Administered 2020-11-17 (×2): 40 mg via INTRAVENOUS
  Filled 2020-11-17 (×2): qty 4

## 2020-11-17 MED ORDER — PREDNISONE 20 MG PO TABS
40.0000 mg | ORAL_TABLET | Freq: Every day | ORAL | Status: DC
  Administered 2020-11-18 – 2020-11-21 (×4): 40 mg via ORAL
  Filled 2020-11-17 (×4): qty 2

## 2020-11-17 MED ORDER — FERROUS SULFATE 325 (65 FE) MG PO TABS
325.0000 mg | ORAL_TABLET | Freq: Every day | ORAL | Status: DC
  Administered 2020-11-17 – 2020-11-21 (×5): 325 mg via ORAL
  Filled 2020-11-17 (×5): qty 1

## 2020-11-17 MED ORDER — PREDNISONE 20 MG PO TABS: 20.0000 mg | ORAL_TABLET | Freq: Every day | ORAL | Status: DC

## 2020-11-17 NOTE — Progress Notes (Signed)
[place] Progress Note Patient Name: Adriana Hall DOB: 1971/02/27 MRN: [id number]   Date of Service  11/17/2020  HPI/Events of Note  RN asks whether AM labs needed.   eICU Interventions  Reviewed chart. No strict need for AM labs today.     Intervention Category Minor Interventions: Other:  Charlott Rakes 11/17/2020, 4:52 AM
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
## 2020-11-17 NOTE — Progress Notes (Signed)
PROGRESS NOTE    Adriana Hall DOB: 03-30-1971 DOA: 11/12/2020 PCP: Patient, No Pcp Per (Inactive)   Brief Narrative:  50 year old with history of tobacco use, history of opioid use admitted for fever and hemoptysis initially on hospital service but due to worsening hypoxia she was admitted to the ICU.  While in the ICU reported of chest pain requiring nitroglycerin and aspirin and improvement in discomfort.  CT chest showed mediastinal lymphadenopathy, subcarinal lymph node and mass in right middle lobe.  Infiltrates were suspicion for multifocal pneumonia versus malignancy.  Pulmonary is following.   Assessment & Plan:   Active Problems:   HYPERTRIGLYCERIDEMIA   HYPERTENSION, BENIGN   Coronary atherosclerosis   SVT/ PSVT/ PAT   GERD (gastroesophageal reflux disease)   GAD (generalized anxiety disorder)   Bipolar 2 disorder (HCC)   Anxiety   Acute respiratory failure with hypoxia (HCC)   CAP (community acquired pneumonia)   Chronic respiratory failure with hypoxia (HCC)   Sepsis ([place])   Hypokalemia   Acute hypoxic respiratory failure secondary to bilateral multifocal pneumonia versus malignancy - Completed course of Z-Pak.  WBC improving - Currently on IV Zosyn, complete 7-day course.  IV steroids-will transition to daily, wean this off - Status post thoracentesis-10 cc cloudy debris drained.  Send for cytology but not enough for micro. 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  Continue home Suboxone - Gabapentin  Anemia of chronic disease, microcytic - Iron deficiency.  Iron supplements and bowel regimen  Tobacco use and alcohol use - Nicotine patch as needed.  Advised to quit drinking alcohol.  No further signs of alcohol withdrawal.  Diabetes mellitus type  2 Peripheral neuropathy - Hemoglobin A1c 6.5.  Insulin sliding scale and Accu-Chek. -Gabapentin   DVT prophylaxis: Lovenox Code Status: Full code Family Communication:    Status is: Inpatient  Remains inpatient appropriate because:Inpatient level of care appropriate due to severity of illness.  Still on oxygen with dyspnea on exertion  Dispo: The patient is from: Home              Anticipated d/c is to: To be determined              Patient currently is not medically stable to d/c.   Difficult to place patient No        Subjective: Dyspnea on exertion and coughing but feels slightly better compared to yesterday  Review of Systems Otherwise negative except as per HPI, including: General: Denies fever, chills, night sweats or unintended weight loss. Resp: Denies hemoptysis Cardiac: Denies chest pain, palpitations, orthopnea, paroxysmal nocturnal dyspnea. GI: Denies abdominal pain, nausea, vomiting, diarrhea or constipation GU: Denies dysuria, frequency, hesitancy or incontinence MS: Denies muscle aches, joint pain or swelling Neuro: Denies headache, neurologic deficits (focal weakness, numbness, tingling), abnormal gait Psych: Denies anxiety, depression, SI/HI/AVH Skin: Denies new rashes or lesions ID: Denies sick contacts, exotic exposures, travel  Examination:  General exam: Appears calm and comfortable,HFNC Respiratory system: Some bilateral rhonchi Cardiovascular system: S1 & S2 heard, RRR. No JVD, murmurs, rubs, gallops or clicks. No pedal edema. Gastrointestinal system: Abdomen is nondistended, soft and nontender. No organomegaly or masses felt. Normal bowel sounds heard. Central nervous system: Alert and oriented. No focal neurological deficits. Extremities: Symmetric 5 x 5 power. Skin: No rashes, lesions  or ulcers Psychiatry: Judgement and insight appear normal. Mood & affect appropriate.     Objective: Vitals:   11/17/20 0400 11/17/20 0439 11/17/20 0500  11/17/20 0600  BP: 136/77  (!) 142/76 (!) 154/86  Pulse: 85  87 91  Resp: 19  (!) 21 19  Temp:  98.3 F (36.8 C)    TempSrc:  Oral    SpO2: 94%  97% 98%  Weight:  84.1 kg    Height:        Intake/Output Summary (Last 24 hours) at 11/17/2020 0648 Last data filed at 11/17/2020 0601 Gross per 24 hour  Intake 98.13 ml  Output 900 ml  Net -801.87 ml   Filed Weights   11/12/20 0357 11/17/20 0439  Weight: 81.6 kg 84.1 kg     Data Reviewed:   CBC: Recent Labs  Lab 11/12/20 0211 11/12/20 0251 11/12/20 1112 11/13/20 0144 11/14/20 0354 11/16/20 0023  WBC 21.9*  --   --  25.8* 23.1* 16.9*  NEUTROABS 19.7*  --   --   --   --   --   HGB 10.7* 12.6 11.6* 9.6* 9.4* 8.8*  HCT 36.8 37.0 34.0* 32.2* 32.3* 29.5*  MCV 70.9*  --   --  69.8* 69.2* 70.1*  PLT 505*  --   --  421* 467* 497*   Basic Metabolic Panel: Recent Labs  Lab 11/12/20 0211 11/12/20 0251 11/12/20 1112 11/13/20 0144 11/14/20 0354 11/16/20 0023  NA 135 138 137 135 135 135  K 3.4* 3.4* 3.2* 3.4* 3.7 4.1  CL 98  --   --  98 94* 93*  CO2 22  --   --  28 27 34*  GLUCOSE 129*  --   --  129* 177* 159*  BUN 8  --   --  8 7 17   CREATININE 0.75  --   --  0.54 0.56 0.54  CALCIUM 8.8*  --   --  8.9 8.7* 8.7*  MG  --   --   --   --  2.0  --    GFR: Estimated Creatinine Clearance: 87.3 mL/min (by C-G formula based on SCr of 0.54 mg/dL). Liver Function Tests: Recent Labs  Lab 11/12/20 0211  AST 42*  ALT 22  ALKPHOS 180*  BILITOT 1.3*  PROT 7.4  ALBUMIN 2.7*   No results for input(s): LIPASE, AMYLASE in the last 168 hours. No results for input(s): AMMONIA in the last 168 hours. Coagulation Profile: No results for input(s): INR, PROTIME in the last 168 hours. Cardiac Enzymes: No results for input(s): CKTOTAL, CKMB, CKMBINDEX, TROPONINI in the last 168 hours. BNP (last 3 results) No results for input(s): PROBNP in the last 8760 hours. HbA1C: Recent Labs    11/14/20 1652  HGBA1C 6.5*   CBG: Recent Labs   Lab 11/15/20 2212 11/16/20 0748 11/16/20 1109 11/16/20 1537 11/16/20 2139  GLUCAP 161* 184* 145* 142* 172*   Lipid Profile: No results for input(s): CHOL, HDL, LDLCALC, TRIG, CHOLHDL, LDLDIRECT in the last 72 hours. Thyroid Function Tests: No results for input(s): TSH, T4TOTAL, FREET4, T3FREE, THYROIDAB in the last 72 hours. Anemia Panel: No results for input(s): VITAMINB12, FOLATE, FERRITIN, TIBC, IRON, RETICCTPCT in the last 72 hours. Sepsis Labs: Recent Labs  Lab 11/12/20 0232 11/12/20 0355 11/12/20 0623 11/13/20 0144  PROCALCITON  --   --  0.91 1.44  LATICACIDVEN 2.3* 1.6  --   --     Recent Results (from the past 240 hour(s))  Culture, blood (  routine x 2)     Status: None (Preliminary result)   Collection Time: 11/12/20  2:11 AM   Specimen: BLOOD  Result Value Ref Range Status   Specimen Description BLOOD RIGHT ANTECUBITAL  Final   Special Requests   Final    BOTTLES DRAWN AEROBIC AND ANAEROBIC Blood Culture adequate volume   Culture   Final    NO GROWTH 4 DAYS Performed at [facility], [address], [place], [place] [address]    Report Status PENDING  Incomplete  Culture, blood (routine x 2)     Status: None (Preliminary result)   Collection Time: 11/12/20  2:15 AM   Specimen: BLOOD LEFT HAND  Result Value Ref Range Status   Specimen Description BLOOD LEFT HAND  Final   Special Requests   Final    BOTTLES DRAWN AEROBIC AND ANAEROBIC Blood Culture results may not be optimal due to an inadequate volume of blood received in culture bottles   Culture   Final    NO GROWTH 4 DAYS Performed at [facility], [place] [address] [place], [place] [address]    Report Status PENDING  Incomplete  Resp Panel by RT-PCR (Flu A&B, Covid) Nasopharyngeal Swab     Status: None   Collection Time: 11/12/20  2:34 AM   Specimen: Nasopharyngeal Swab; Nasopharyngeal(NP) swabs in vial transport medium  Result Value Ref Range Status   SARS Coronavirus 2 by RT PCR NEGATIVE  NEGATIVE Final    Comment: (NOTE) SARS-CoV-2 target nucleic acids are NOT DETECTED.  The SARS-CoV-2 RNA is generally detectable in upper respiratory specimens during the acute phase of infection. The lowest concentration of SARS-CoV-2 viral copies this assay can detect is 138 copies/mL. A negative result does not preclude SARS-Cov-2 infection and should not be used as the sole basis for treatment or other patient management decisions. A negative result may occur with  improper specimen collection/handling, submission of specimen other than nasopharyngeal swab, presence of viral mutation(s) within the areas targeted by this assay, and inadequate number of viral copies(<138 copies/mL). A negative result must be combined with clinical observations, patient history, and epidemiological information. The expected result is Negative.  Fact Sheet for Patients:  EntrepreneurPulse.com.au  Fact Sheet for Healthcare Providers:  IncredibleEmployment.be  This test is no t yet approved or cleared by the [place] FDA and  has been authorized for detection and/or diagnosis of SARS-CoV-2 by FDA under an Emergency Use Authorization (EUA). This EUA will remain  in effect (meaning this test can be used) for the duration of the COVID-19 declaration under Section 564(b)(1) of the Act, 21 U.S.C.section 360bbb-3(b)(1), unless the authorization is terminated  or revoked sooner.       Influenza A by PCR NEGATIVE NEGATIVE Final   Influenza B by PCR NEGATIVE NEGATIVE Final    Comment: (NOTE) The Xpert Xpress SARS-CoV-2/FLU/RSV plus assay is intended as an aid in the diagnosis of influenza from Nasopharyngeal swab specimens and should not be used as a sole basis for treatment. Nasal washings and aspirates are unacceptable for Xpert Xpress SARS-CoV-2/FLU/RSV testing.  Fact Sheet for Patients: EntrepreneurPulse.com.au  Fact Sheet for Healthcare  Providers: IncredibleEmployment.be  This test is not yet approved or cleared by the [place] FDA and has been authorized for detection and/or diagnosis of SARS-CoV-2 by FDA under an Emergency Use Authorization (EUA). This EUA will remain in effect (meaning this test can be used) for the duration of the COVID-19 declaration under Section 564(b)(1) of  the Act, 21 U.S.C. section 360bbb-3(b)(1), unless the authorization is terminated or revoked.  Performed at [facility], [place] [address], [place], [place] [address]   MRSA PCR Screening     Status: None   Collection Time: 11/12/20  6:36 AM   Specimen: Nasopharyngeal  Result Value Ref Range Status   MRSA by PCR NEGATIVE NEGATIVE Final    Comment:        The GeneXpert MRSA Assay (FDA approved for NASAL specimens only), is one component of a comprehensive MRSA colonization surveillance program. It is not intended to diagnose MRSA infection nor to guide or monitor treatment for MRSA infections. Performed at [facility], [place] [address], [place], [place] [address]   Urine culture     Status: None   Collection Time: 11/13/20  4:10 PM   Specimen: Urine, Random  Result Value Ref Range Status   Specimen Description URINE, RANDOM  Final   Special Requests NONE  Final   Culture   Final    NO GROWTH Performed at [facility], [place] [address] [place], [place] [address]    Report Status 11/15/2020 FINAL  Final         Radiology Studies: DG CHEST PORT 1 VIEW  Result Date: 11/16/2020 CLINICAL DATA:  Status post right thoracentesis EXAM: PORTABLE CHEST 1 VIEW COMPARISON:  11/14/2020 FINDINGS: Cardiac shadow is enlarged but stable. Diffuse airspace opacities are identified throughout both lungs but mildly improved when compared with the prior exam. Interval decrease in right-sided pleural effusion is noted following thoracentesis. No pneumothorax is seen. No acute bony abnormality is noted.  IMPRESSION: Slight improved aeration bilaterally when compared with the prior study. Reduction in right effusion following thoracentesis without evidence of pneumothorax. Electronically Signed   By: Inez Catalina M.D.   On: 11/16/2020 11:12        Scheduled Meds:  arformoterol  15 mcg Nebulization BID   budesonide (PULMICORT) nebulizer solution  0.25 mg Nebulization BID   buprenorphine-naloxone  1 tablet Sublingual BID   Chlorhexidine Gluconate Cloth  6 each Topical Daily   diphenhydrAMINE  50 mg Oral QHS   DULoxetine  30 mg Oral Daily   DULoxetine  60 mg Oral Daily   enoxaparin (LOVENOX) injection  40 mg Subcutaneous Q24H   feeding supplement  237 mL Oral BID BM   folic acid  1 mg Oral Daily   gabapentin  400 mg Oral TID   insulin aspart  0-15 Units Subcutaneous TID WC   mouth rinse  15 mL Mouth Rinse BID   methylPREDNISolone (SOLU-MEDROL) injection  60 mg Intravenous Q12H   multivitamins with iron  1 tablet Oral Daily   nicotine  21 mg Transdermal Daily   OLANZapine  2.5 mg Oral QHS   omeprazole  40 mg Oral Daily   revefenacin  175 mcg Nebulization Daily   vitamin B-12  100 mcg Oral Daily   Continuous Infusions:  sodium chloride Stopped (11/15/20 0508)   piperacillin-tazobactam (ZOSYN)  IV 3.375 g (11/17/20 0601)     LOS: 5 days   Time spent= 35 mins    Martavis Gurney Arsenio Loader, MD [facility]  If 7PM-7AM, please contact night-coverage  11/17/2020, 6:48 AM

## 2020-11-17 NOTE — Progress Notes (Addendum)
   NAME:  Adriana Hall, MRN:  [id number], DOB:  [birth year], LOS: 5 ADMISSION DATE:  11/12/2020, CONSULTATION DATE:  11/12/20 REFERRING MD:  Doristine Bosworth, CHIEF COMPLAINT:  SOB   History of Present Illness:  Adriana Hall is a 50 year old woman, daily smoker with history of opioid abuse and several weeks of cough with more recent onset of fevers and hemoptysis.   She was initially admitted under the hospitalist service to a step down unit but she had an episode of desaturations and diaphoresis with using the bed pan and. She was transitioned to [facility] [place] at 100% PCCM has been consulted for respiratory failure and abnormal CT chest scan.    Significant Hospital Events:  6/9 admitted 6/11 brief run of non-sustained VT overnight. she remains on high dose supplemental oxygen but states she feels improved  6/12 Oxygen requirement and subjective dyspnea improved with addition of steroids. ON had episode of chest tightness and nausea tx with nitroglycerin, ASA, and zofran w/ stable EKG 6/13 thora, only enough fluid for cyto  Interim History / Subjective:  No events. States breathing improved. Remains on HFNC 25L 60%.  Objective   Blood pressure (!) 151/84, pulse 91, temperature 98.3 F (36.8 C), temperature source Oral, resp. rate 15, height 5' 4.5" (1.638 m), weight 84.1 kg, SpO2 95 %.    FiO2 (%):  [50 %-60 %] 50 %   Intake/Output Summary (Last 24 hours) at 11/17/2020 0856 Last data filed at 11/17/2020 0800 Gross per 24 hour  Intake 172.77 ml  Output 900 ml  Net -727.23 ml    Filed Weights   11/12/20 0357 11/17/20 0439  Weight: 81.6 kg 84.1 kg    Examination: Constitutional: no acute distress  Eyes: EOMI, pupils equal Ears, nose, mouth, and throat: MMM, trachea midline Cardiovascular: RRR, ext warm Respiratory: scattered wheezing, no accessory muscle use Gastrointestinal: soft, +BS Skin: No rashes, normal turgor Neurologic: moves all 4 ext Psychiatric: RASS 0, less anxious  today  No new labs   Labs/imaging that I have personally reviewed   6/9 CT Chest: mediastinal lymphadenopathy on right, enlarge subcarinal lymph node. Diffuse patchy airspace disease. Mass in right middle lobe. Appearance of pleural studding. 6/11 CXR Mixed interstitial airspace opacities throughout both lungs, right greater than left with more masslike opacity in the right mid lung. Right pleural effusion. 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 QT/QTcB 406/515 ms  CXR 6/13 continued airspace disease  Resolved Hospital Problem list   Sepsis  Hypokalemia  Assessment & Plan:   Acute hypoxemic respiratory failure with abnormal CT chest In context of several months worsening SOB, DOE, hemoptysis, subjective fevers, night sweats (attributed to menopause) No response to steroids or abx although subjectively feels better CT is strange with asymmetric right pleural thickening and RML mass like infiltrate along with scattered GGO bilaterally  None of her meds stand out to cause this Discussed case with Dr. Carlis Abbott - Check ESR/CRP/ANCA/RF/CCP - Continue zosyn, switch IV steroids to slow PO taper - Encourage IS - Sputum culture if able - Check nasal viral panel - f/u cytology R pleural space - Lasix challenge - Will need close OP imaging - Medical student will get more detailed exposure history - Stable for PCU transfer, continue trying to wean FiO2, patient does not want intubation unless as a last resort - Will follow  Tobacco, alcohol abuse Chronic suboxone use  Erskine Emery MD PCCM

## 2020-11-18 ENCOUNTER — Inpatient Hospital Stay (HOSPITAL_COMMUNITY): Payer: Medicaid Other

## 2020-11-18 DIAGNOSIS — J849 Interstitial pulmonary disease, unspecified: Secondary | ICD-10-CM

## 2020-11-18 DIAGNOSIS — Z720 Tobacco use: Secondary | ICD-10-CM

## 2020-11-18 DIAGNOSIS — I10 Essential (primary) hypertension: Secondary | ICD-10-CM

## 2020-11-18 DIAGNOSIS — R7989 Other specified abnormal findings of blood chemistry: Secondary | ICD-10-CM

## 2020-11-18 DIAGNOSIS — E876 Hypokalemia: Secondary | ICD-10-CM

## 2020-11-18 DIAGNOSIS — F1210 Cannabis abuse, uncomplicated: Secondary | ICD-10-CM

## 2020-11-18 LAB — CBC
HCT: 34.4 % — ABNORMAL LOW (ref 36.0–46.0)
Hemoglobin: 9.8 g/dL — ABNORMAL LOW (ref 12.0–15.0)
MCH: 20.3 pg — ABNORMAL LOW (ref 26.0–34.0)
MCHC: 28.5 g/dL — ABNORMAL LOW (ref 30.0–36.0)
MCV: 71.2 fL — ABNORMAL LOW (ref 80.0–100.0)
Platelets: 551 10*3/uL — ABNORMAL HIGH (ref 150–400)
RBC: 4.83 MIL/uL (ref 3.87–5.11)
RDW: 22.4 % — ABNORMAL HIGH (ref 11.5–15.5)
WBC: 14.8 10*3/uL — ABNORMAL HIGH (ref 4.0–10.5)
nRBC: 0.5 % — ABNORMAL HIGH (ref 0.0–0.2)

## 2020-11-18 LAB — BRAIN NATRIURETIC PEPTIDE: B Natriuretic Peptide: 462.1 pg/mL — ABNORMAL HIGH (ref 0.0–100.0)

## 2020-11-18 LAB — BASIC METABOLIC PANEL
Anion gap: 13 (ref 5–15)
BUN: 25 mg/dL — ABNORMAL HIGH (ref 6–20)
CO2: 32 mmol/L (ref 22–32)
Calcium: 9.4 mg/dL (ref 8.9–10.3)
Chloride: 90 mmol/L — ABNORMAL LOW (ref 98–111)
Creatinine, Ser: 0.65 mg/dL (ref 0.44–1.00)
GFR, Estimated: >60 mL/min (ref >60)
Glucose, Bld: 106 mg/dL — ABNORMAL HIGH (ref 70–99)
Potassium: 3.3 mmol/L — ABNORMAL LOW (ref 3.5–5.1)
Sodium: 135 mmol/L (ref 135–145)

## 2020-11-18 LAB — ECHOCARDIOGRAM COMPLETE
Area-P 1/2: 3.27 cm2
Height: 64.5 in
P 1/2 time: 444 msec
S' Lateral: 2.9 cm
Weight: 2966.51 oz

## 2020-11-18 LAB — EXPECTORATED SPUTUM ASSESSMENT W GRAM STAIN, RFLX TO RESP C

## 2020-11-18 LAB — GLUCOSE, CAPILLARY
Glucose-Capillary: 169 mg/dL — ABNORMAL HIGH (ref 70–99)
Glucose-Capillary: 153 mg/dL — ABNORMAL HIGH (ref 70–99)
Glucose-Capillary: 178 mg/dL — ABNORMAL HIGH (ref 70–99)
Glucose-Capillary: 167 mg/dL — ABNORMAL HIGH (ref 70–99)

## 2020-11-18 LAB — RHEUMATOID FACTOR: Rheumatoid fact SerPl-aCnc: 11.5 IU/mL (ref <14.0)

## 2020-11-18 LAB — MAGNESIUM: Magnesium: 2.3 mg/dL (ref 1.7–2.4)

## 2020-11-18 LAB — ANA W/REFLEX IF POSITIVE: Anti Nuclear Antibody (ANA): NEGATIVE

## 2020-11-18 LAB — ANGIOTENSIN CONVERTING ENZYME: Angiotensin-Converting Enzyme: 41 U/L (ref 14–82)

## 2020-11-18 MED ORDER — NYSTATIN 100000 UNIT/ML MT SUSP
5.0000 mL | Freq: Four times a day (QID) | OROMUCOSAL | Status: DC
  Administered 2020-11-18 – 2020-11-21 (×10): 500000 [IU] via OROMUCOSAL
  Filled 2020-11-18 (×10): qty 5

## 2020-11-18 MED ORDER — POTASSIUM CHLORIDE CRYS ER 20 MEQ PO TBCR
40.0000 meq | EXTENDED_RELEASE_TABLET | ORAL | Status: AC
  Administered 2020-11-18 (×2): 40 meq via ORAL
  Filled 2020-11-18 (×2): qty 2

## 2020-11-18 NOTE — Progress Notes (Signed)
  Echocardiogram 2D Echocardiogram has been performed.  Tayler Lassen G Mandeep Ferch 11/18/2020, 1:49 PM

## 2020-11-18 NOTE — Progress Notes (Signed)
PROGRESS NOTE  Adriana Hall January YBO:[id number] DOB: Sep 22, 1970   PCP: Patient, No Pcp Per (Inactive)  Patient is from: Home  DOA: 11/12/2020 LOS: 6  Chief complaints: Fever and hemoptysis  Brief Narrative / Interim history: 50 year old F with history of EtOH use, tobacco use, opioid use, HTN, anxiety, bipolar disorder and DM-2 presenting with fever and hemoptysis, and admitted due to hypoxic respiratory failure and respiratory distress in the setting of bilateral multifocal pneumonia versus malignancy.  CTA chest with diffuse groundglass opacity with loculated right-sided pleural effusion and possibility of mass concerning for malignancy.  She had leukocytosis to 20K and elevated D-dimer but PE negative.  She was started on broad-spectrum antibiotics.  Patient was transferred to ICU due to progressive hypoxia and respiratory distress.  She required H HFNC up to 40 L at 100% but no intubation.  Eventually improved, and transferred back to [facility] hospitalist service.  Still on 20 L / 50% FiO2 to maintain appropriate saturation.  She is on IV Zosyn and systemic steroid.  PCCM following.  Subjective: Seen and examined earlier this morning.  No major events overnight of this morning.  No complaints.  She says she feels fine other than some cough.  She is saturating at 91% on 20 L HFNC/50% FiO2 at rest.  She denies chest pain or shortness of breath except when she moves.  Denies GI or UTI symptoms.  Husband at bedside.  Objective: Vitals:   11/18/20 0740 11/18/20 0811 11/18/20 0820 11/18/20 1219  BP: 112/60   (!) 108/59  Pulse: 97 94  92  Resp: 19 14  14   Temp: 98.7 F (37.1 C)   99.1 F (37.3 C)  TempSrc: Oral   Oral  SpO2: 92% 93% 93% 94%  Weight:      Height:        Intake/Output Summary (Last 24 hours) at 11/18/2020 1452 Last data filed at 11/18/2020 0541 Gross per 24 hour  Intake 530 ml  Output 2000 ml  Net -1470 ml   Filed Weights   11/12/20 0357 11/17/20 0439  Weight: 81.6 kg  84.1 kg    Examination:  GENERAL: No apparent distress.  Nontoxic. HEENT: MMM.  Vision and hearing grossly intact.  NECK: Supple.  No apparent JVD.  RESP: 91% on 20 L / 50% FiO2 no IWOB.  Rhonchi bilaterally. CVS:  RRR. Heart sounds normal.  ABD/GI/GU: BS+. Abd soft, NTND.  MSK/EXT:  Moves extremities. No apparent deformity. No edema.  SKIN: no apparent skin lesion or wound NEURO: Awake, alert and oriented appropriately.  No apparent focal neuro deficit. PSYCH: Calm. Normal affect.   Procedures:  6/13-thoracocentesis  Microbiology summarized: IDPOE-42 and influenza PCR nonreactive. MRSA PCR screen negative. Blood cultures negative. Urine culture negative. Full RVP panel negative.   Assessment & Plan: Acute hypoxic respiratory failure secondary to bilateral multifocal pneumonia/airspace disease: Also concern for possible ILD, inflammatory autoimmune process.  Currently on 20 L HFNC/50% FiO2 to maintain saturation in low 90s.  CXR with improved right-sided opacity and persistent left lateral opacity. -Completed course of Z-Pak.  WBC improving -Currently on IV Zosyn, complete 7-day course.  -Transitioned from Solu-Medrol to p.o. prednisone taper -S/p thoracentesis-10 cc cloudy debris drained.  Cytology negative.  No suction samples for micro. -Bronchodilators, I-S, flutter valve -Pulmonary following -Follow-up autoimmune labs and limited TTE  Right middle lobe lung mass with adenopathy -Needs repeat CT chest once pneumonia has cleared  Bipolar disorder with chronic opioid use -Continue Zyprexa and duloxetine.  Continue  home Suboxone -Gabapentin  Iron deficiency anemia: H&H relatively stable. Recent Labs    12/25/19 1725 12/26/19 1645 11/12/20 0211 11/12/20 0251 11/12/20 1112 11/13/20 0144 11/14/20 0354 11/16/20 0023 11/18/20 0146  HGB 11.1* 10.8* 10.7* 12.6 11.6* 9.6* 9.4* 8.8* 9.8*  -Continue iron supplements and bowel regimen -Monitor H&H  intermittently.  NSVT: The night of 6/11.  Likely due to #1.   Tobacco use and alcohol use: No withdrawal symptoms. -Counseled on alcohol and tobacco cessation's. -Nicotine patch as needed.     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   Elevated BNP: Likely from fluid resuscitation.  Appears euvolemic. -Check limited TTE  Hypokalemia: -Replenish and recheck.  Class I obesity Body mass index is 31.33 kg/m.         DVT prophylaxis:  enoxaparin (LOVENOX) injection 40 mg Start: 11/13/20 1100 SCDs Start: 11/12/20 4709  Code Status: Full code Family Communication: Patient and/or RN.  Updated patient's husband at bedside. Level of care: Progressive Status is: Inpatient  Remains inpatient appropriate because:Ongoing diagnostic testing needed not appropriate for outpatient work up, IV treatments appropriate due to intensity of illness or inability to take PO, and Inpatient level of care appropriate due to severity of illness  Dispo: The patient is from: Home              Anticipated d/c is to: Home              Patient currently is not medically stable to d/c.   Difficult to place patient No       Consultants:  Pulmonology   Sch Meds:  Scheduled Meds:  arformoterol  15 mcg Nebulization BID   budesonide (PULMICORT) nebulizer solution  0.25 mg Nebulization BID   buprenorphine-naloxone  1 tablet Sublingual BID   Chlorhexidine Gluconate Cloth  6 each Topical Daily   diphenhydrAMINE  50 mg Oral QHS   DULoxetine  30 mg Oral Daily   DULoxetine  60 mg Oral Daily   enoxaparin (LOVENOX) injection  40 mg Subcutaneous Q24H   feeding supplement  237 mL Oral BID BM   ferrous sulfate  325 mg Oral Q breakfast   folic acid  1 mg Oral Daily   gabapentin  400 mg Oral TID   insulin  aspart  0-15 Units Subcutaneous TID WC   mouth rinse  15 mL Mouth Rinse BID   multivitamins with iron  1 tablet Oral Daily   nicotine  21 mg Transdermal Daily   OLANZapine  2.5 mg Oral QHS   omeprazole  40 mg Oral Daily   predniSONE  40 mg Oral Q breakfast   Followed by   Derrill Memo ON 11/25/2020] predniSONE  20 mg Oral Q breakfast   revefenacin  175 mcg Nebulization Daily   vitamin B-12  100 mcg Oral Daily   Continuous Infusions:  sodium chloride Stopped (11/15/20 0508)   piperacillin-tazobactam (ZOSYN)  IV 3.375 g (11/18/20 1348)   PRN Meds:.sodium chloride, acetaminophen **OR** acetaminophen, benzonatate, guaiFENesin-dextromethorphan, hydrOXYzine, ipratropium-albuterol, nitroGLYCERIN, ondansetron (ZOFRAN) IV, senna-docusate, simethicone, traZODone  Antimicrobials: Anti-infectives (From admission, onward)    Start     Dose/Rate Route Frequency Ordered Stop   11/13/20 1400  piperacillin-tazobactam (ZOSYN) IVPB 3.375 g        3.375 g 12.5 mL/hr over 240 Minutes Intravenous Every 8 hours 11/13/20 0925 11/20/20  1359   11/12/20 0700  ceFEPIme (MAXIPIME) 2 g in sodium chloride 0.9 % 100 mL IVPB  Status:  Discontinued        2 g 200 mL/hr over 30 Minutes Intravenous Every 8 hours 11/12/20 0655 11/13/20 0925   11/12/20 0700  vancomycin (VANCOREADY) IVPB 1750 mg/350 mL  Status:  Discontinued        1,750 mg 175 mL/hr over 120 Minutes Intravenous Daily 11/12/20 0655 11/13/20 0925   11/12/20 0645  metroNIDAZOLE (FLAGYL) IVPB 500 mg  Status:  Discontinued        500 mg 100 mL/hr over 60 Minutes Intravenous Every 8 hours 11/12/20 0634 11/13/20 0925   11/12/20 0315  cefTRIAXone (ROCEPHIN) 2 g in sodium chloride 0.9 % 100 mL IVPB  Status:  Discontinued        2 g 200 mL/hr over 30 Minutes Intravenous Every 24 hours 11/12/20 0306 11/12/20 0625   11/12/20 0315  azithromycin (ZITHROMAX) 500 mg in sodium chloride 0.9 % 250 mL IVPB        500 mg 250 mL/hr over 60 Minutes Intravenous Every 24 hours  11/12/20 0306 11/16/20 0444        I have personally reviewed the following labs and images: CBC: Recent Labs  Lab 11/12/20 0211 11/12/20 0251 11/12/20 1112 11/13/20 0144 11/14/20 0354 11/16/20 0023 11/18/20 0146  WBC 21.9*  --   --  25.8* 23.1* 16.9* 14.8*  NEUTROABS 19.7*  --   --   --   --   --   --   HGB 10.7*   < > 11.6* 9.6* 9.4* 8.8* 9.8*  HCT 36.8   < > 34.0* 32.2* 32.3* 29.5* 34.4*  MCV 70.9*  --   --  69.8* 69.2* 70.1* 71.2*  PLT 505*  --   --  421* 467* 499* 551*   < > = values in this interval not displayed.   BMP &GFR Recent Labs  Lab 11/12/20 0211 11/12/20 0251 11/12/20 1112 11/13/20 0144 11/14/20 0354 11/16/20 0023 11/18/20 0146  NA 135   < > 137 135 135 135 135  K 3.4*   < > 3.2* 3.4* 3.7 4.1 3.3*  CL 98  --   --  98 94* 93* 90*  CO2 22  --   --  28 27 34* 32  GLUCOSE 129*  --   --  129* 177* 159* 106*  BUN 8  --   --  8 7 17  25*  CREATININE 0.75  --   --  0.54 0.56 0.54 0.65  CALCIUM 8.8*  --   --  8.9 8.7* 8.7* 9.4  MG  --   --   --   --  2.0  --  2.3   < > = values in this interval not displayed.   Estimated Creatinine Clearance: 87.3 mL/min (by C-G formula based on SCr of 0.65 mg/dL). Liver & Pancreas: Recent Labs  Lab 11/12/20 0211  AST 42*  ALT 22  ALKPHOS 180*  BILITOT 1.3*  PROT 7.4  ALBUMIN 2.7*   No results for input(s): LIPASE, AMYLASE in the last 168 hours. No results for input(s): AMMONIA in the last 168 hours. Diabetic: No results for input(s): HGBA1C in the last 72 hours. Recent Labs  Lab 11/17/20 1123 11/17/20 1523 11/17/20 2235 11/18/20 0742 11/18/20 1221  GLUCAP 168* 180* 133* 169* 153*   Cardiac Enzymes: No results for input(s): CKTOTAL, CKMB, CKMBINDEX, TROPONINI in the last 168 hours. No results for  input(s): PROBNP in the last 8760 hours. Coagulation Profile: No results for input(s): INR, PROTIME in the last 168 hours. Thyroid Function Tests: Recent Labs    11/17/20 0757  TSH 0.506   Lipid  Profile: No results for input(s): CHOL, HDL, LDLCALC, TRIG, CHOLHDL, LDLDIRECT in the last 72 hours. Anemia Panel: No results for input(s): VITAMINB12, FOLATE, FERRITIN, TIBC, IRON, RETICCTPCT in the last 72 hours. Urine analysis:    Component Value Date/Time   COLORURINE AMBER (A) 11/13/2020 1610   APPEARANCEUR CLEAR 11/13/2020 1610   LABSPEC 1.019 11/13/2020 1610   PHURINE 6.0 11/13/2020 1610   GLUCOSEU NEGATIVE 11/13/2020 1610   HGBUR NEGATIVE 11/13/2020 1610   BILIRUBINUR NEGATIVE 11/13/2020 1610   KETONESUR 20 (A) 11/13/2020 1610   PROTEINUR NEGATIVE 11/13/2020 1610   UROBILINOGEN 0.2 06/20/2013 1626   NITRITE NEGATIVE 11/13/2020 1610   LEUKOCYTESUR NEGATIVE 11/13/2020 1610   Sepsis Labs: Invalid input(s): PROCALCITONIN, [place]  Microbiology: Recent Results (from the past 240 hour(s))  Culture, blood (routine x 2)     Status: None   Collection Time: 11/12/20  2:11 AM   Specimen: BLOOD  Result Value Ref Range Status   Specimen Description BLOOD RIGHT ANTECUBITAL  Final   Special Requests   Final    BOTTLES DRAWN AEROBIC AND ANAEROBIC Blood Culture adequate volume   Culture   Final    NO GROWTH 5 DAYS Performed at [facility], [address], [place], [place] [address]    Report Status 11/17/2020 FINAL  Final  Culture, blood (routine x 2)     Status: None   Collection Time: 11/12/20  2:15 AM   Specimen: BLOOD LEFT HAND  Result Value Ref Range Status   Specimen Description BLOOD LEFT HAND  Final   Special Requests   Final    BOTTLES DRAWN AEROBIC AND ANAEROBIC Blood Culture results may not be optimal due to an inadequate volume of blood received in culture bottles   Culture   Final    NO GROWTH 5 DAYS Performed at [facility], [place] [address], [place], [place] [address]    Report Status 11/17/2020 FINAL  Final  Resp Panel by RT-PCR (Flu A&B, Covid) Nasopharyngeal Swab     Status: None   Collection Time: 11/12/20  2:34 AM   Specimen:  Nasopharyngeal Swab; Nasopharyngeal(NP) swabs in vial transport medium  Result Value Ref Range Status   SARS Coronavirus 2 by RT PCR NEGATIVE NEGATIVE Final    Comment: (NOTE) SARS-CoV-2 target nucleic acids are NOT DETECTED.  The SARS-CoV-2 RNA is generally detectable in upper respiratory specimens during the acute phase of infection. The lowest concentration of SARS-CoV-2 viral copies this assay can detect is 138 copies/mL. A negative result does not preclude SARS-Cov-2 infection and should not be used as the sole basis for treatment or other patient management decisions. A negative result may occur with  improper specimen collection/handling, submission of specimen other than nasopharyngeal swab, presence of viral mutation(s) within the areas targeted by this assay, and inadequate number of viral copies(<138 copies/mL). A negative result must be combined with clinical observations, patient history, and epidemiological information. The expected result is Negative.  Fact Sheet for Patients:  EntrepreneurPulse.com.au  Fact Sheet for Healthcare Providers:  IncredibleEmployment.be  This test is no t yet approved or cleared by the [place] FDA and  has been authorized for detection and/or diagnosis of SARS-CoV-2 by FDA under an Emergency Use Authorization (EUA). This EUA will remain  in  effect (meaning this test can be used) for the duration of the COVID-19 declaration under Section 564(b)(1) of the Act, 21 U.S.C.section 360bbb-3(b)(1), unless the authorization is terminated  or revoked sooner.       Influenza A by PCR NEGATIVE NEGATIVE Final   Influenza B by PCR NEGATIVE NEGATIVE Final    Comment: (NOTE) The Xpert Xpress SARS-CoV-2/FLU/RSV plus assay is intended as an aid in the diagnosis of influenza from Nasopharyngeal swab specimens and should not be used as a sole basis for treatment. Nasal washings and aspirates are unacceptable for  Xpert Xpress SARS-CoV-2/FLU/RSV testing.  Fact Sheet for Patients: EntrepreneurPulse.com.au  Fact Sheet for Healthcare Providers: IncredibleEmployment.be  This test is not yet approved or cleared by the [place] FDA and has been authorized for detection and/or diagnosis of SARS-CoV-2 by FDA under an Emergency Use Authorization (EUA). This EUA will remain in effect (meaning this test can be used) for the duration of the COVID-19 declaration under Section 564(b)(1) of the Act, 21 U.S.C. section 360bbb-3(b)(1), unless the authorization is terminated or revoked.  Performed at [facility], [place] [address] [place], [place] [address]   MRSA PCR Screening     Status: None   Collection Time: 11/12/20  6:36 AM   Specimen: Nasopharyngeal  Result Value Ref Range Status   MRSA by PCR NEGATIVE NEGATIVE Final    Comment:        The GeneXpert MRSA Assay (FDA approved for NASAL specimens only), is one component of a comprehensive MRSA colonization surveillance program. It is not intended to diagnose MRSA infection nor to guide or monitor treatment for MRSA infections. Performed at [facility], [place] [address], [place], [place] [address]   Urine culture     Status: None   Collection Time: 11/13/20  4:10 PM   Specimen: Urine, Random  Result Value Ref Range Status   Specimen Description URINE, RANDOM  Final   Special Requests NONE  Final   Culture   Final    NO GROWTH Performed at [facility], [place] [address] [place], [place] [address]    Report Status 11/15/2020 FINAL  Final  Expectorated Sputum Assessment w Gram Stain, Rflx to Resp Cult     Status: None   Collection Time: 11/16/20  6:24 AM   Specimen: Expectorated Sputum  Result Value Ref Range Status   Specimen Description EXPECTORATED SPUTUM  Final   Special Requests NONE  Final   Sputum evaluation   Final    Sputum specimen not acceptable for testing.  Please  recollect.   RESULT CALLED TO, READ BACK BY AND VERIFIED WITH: RN A.[email or web address] AT 0916 ON 11/18/2020 BY T.SAAD. Performed at [facility], [place] [address] [place], [place] [address]    Report Status 11/18/2020 FINAL  Final  Respiratory (~20 pathogens) panel by PCR     Status: None   Collection Time: 11/17/20  3:44 PM   Specimen: Nasopharyngeal Swab; Respiratory  Result Value Ref Range Status   Adenovirus NOT DETECTED NOT DETECTED Final   Coronavirus 229E NOT DETECTED NOT DETECTED Final    Comment: (NOTE) The Coronavirus on the Respiratory Panel, DOES NOT test for the novel  Coronavirus (2019 nCoV)    Coronavirus HKU1 NOT DETECTED NOT DETECTED Final   Coronavirus NL63 NOT DETECTED NOT DETECTED Final   Coronavirus OC43 NOT DETECTED NOT DETECTED Final   Metapneumovirus NOT DETECTED NOT DETECTED Final   Rhinovirus / Enterovirus NOT DETECTED NOT DETECTED  Final   Influenza A NOT DETECTED NOT DETECTED Final   Influenza B NOT DETECTED NOT DETECTED Final   Parainfluenza Virus 1 NOT DETECTED NOT DETECTED Final   Parainfluenza Virus 2 NOT DETECTED NOT DETECTED Final   Parainfluenza Virus 3 NOT DETECTED NOT DETECTED Final   Parainfluenza Virus 4 NOT DETECTED NOT DETECTED Final   Respiratory Syncytial Virus NOT DETECTED NOT DETECTED Final   Bordetella pertussis NOT DETECTED NOT DETECTED Final   Bordetella Parapertussis NOT DETECTED NOT DETECTED Final   Chlamydophila pneumoniae NOT DETECTED NOT DETECTED Final   Mycoplasma pneumoniae NOT DETECTED NOT DETECTED Final    Comment: Performed at [facility], [place] [address] [place], [place] [address]    Radiology Studies: DG Chest Port 1 View  Result Date: 11/18/2020 CLINICAL DATA:  Respiratory failure EXAM: PORTABLE CHEST 1 VIEW COMPARISON:  November 16, 2020 FINDINGS: There is airspace opacity in the left mid lung and both lower lung regions. No consolidation. Heart is mildly enlarged, stable. The pulmonary vascularity is normal. No adenopathy  no bone lesions. IMPRESSION: Airspace opacity in the left mid lung and both lower lung regions. Question atypical organism pneumonia. Check of COVID-19 status advised in this regard. Stable cardiac prominence. Electronically Signed   By: Lowella Grip III M.D.   On: 11/18/2020 07:58       Corgan Mormile T. [place]  If 7PM-7AM, please contact night-coverage [email or web address] 11/18/2020, 2:52 PM

## 2020-11-18 NOTE — Plan of Care (Signed)
  Problem: Education: Goal: Knowledge of General Education information will improve Description Including pain rating scale, medication(s)/side effects and non-pharmacologic comfort measures Outcome: Progressing   Problem: Health Behavior/Discharge Planning: Goal: Ability to manage health-related needs will improve Outcome: Progressing   

## 2020-11-18 NOTE — Progress Notes (Signed)
NAME:  Adriana Hall, MRN:  [id number], DOB:  1971-02-01, LOS: 22 ADMISSION DATE:  11/12/2020, CONSULTATION DATE:  11/12/20 REFERRING MD:  Doristine Bosworth, CHIEF COMPLAINT:  SOB   History of Present Illness:  Adriana Hall is a 50 year old woman, daily smoker with history of opioid abuse and several weeks of cough with more recent onset of fevers and hemoptysis.   She was initially admitted under the hospitalist service to a step down unit but she had an episode of desaturations and diaphoresis with using the bed pan and. She was transitioned to [facility] at [place] at 100% PCCM has been consulted for respiratory failure and abnormal CT chest scan.    Significant Hospital Events:  6/9 admitted 6/11 brief run of non-sustained VT overnight. she remains on high dose supplemental oxygen but states she feels improved  6/12 Oxygen requirement and subjective dyspnea improved with addition of steroids. ON had episode of chest tightness and nausea tx with nitroglycerin, ASA, and zofran w/ stable EKG 6/13 thora, only enough fluid for cyto.  6/14 Transferred out of ICU 6/15 Still on 20L, 50% FiO2. BNP elevated  Interim History / Subjective:  Adriana Hall feels that her breathing is stable. She had a cough this morning with some sputum production, but otherwise denies complaints.   Objective   Blood pressure (!) 108/59, pulse 92, temperature 99.1 F (37.3 C), temperature source Oral, resp. rate 14, height 5' 4.5" (1.638 m), weight 84.1 kg, SpO2 94 %.    FiO2 (%):  [50 %] 50 %   Intake/Output Summary (Last 24 hours) at 11/18/2020 1303 Last data filed at 11/18/2020 0541 Gross per 24 hour  Intake 530 ml  Output 2000 ml  Net -1470 ml    Filed Weights   11/12/20 0357 11/17/20 0439  Weight: 81.6 kg 84.1 kg    Examination: Constitutional:  chronically ill appearing woman sitting up in bed in NAD HEENT: [place]/AT, eyes anicteric Cardiovascular: S1S2, RRR Respiratory: Breathing comfortably on HFNC, no accessory muscle  use or conversational dyspnea. CTAB anteriorly. Gastrointestinal: soft, NT, ND Skin: no rashes, warm, dry. No ecchymoses or petechiae. Neurologic: awake, alert, answering questions appropriately, moving extremities MSK: no edema or cyanosis Psychiatric: cooperative with exam, appropriate affect   Labs/imaging that I have personally reviewed   Previously reviewed CT scan and imaging with Dr. Tamala Julian. Asymmetric pleural thickening involving only the R hemothorax. GGO in both lungs, somewhat sparing the bases, but extending to the pleura. Some areas of traction bronchiectasis in upper lobes, but no obvious fibrosis.  6/13 pleural fluid> no malignant cells RVP negative (nasal swab) BNP 462.1 CRP 8.0 ESR 50 ACE pending ANCA titers pending ANA w/ reflex pending RF 11.5 (negative) CCP pending Sputum culture 6/13- inadequate sample CXR 6/15> improving R opacities, L lateral opacities persistent  Resolved Hospital Problem list   Sepsis  Hypokalemia  Assessment & Plan:   Acute hypoxemic respiratory failure with abnormal CT chest- multilobar bilateral airspace disease, somewhat peripherally predominant. Has had several months worsening SOB, DOE, hemoptysis, subjective fevers, night sweats (attributed to menopause) Minimal clinical response to steroids or abx, although subjectively feels better. At risk for smoking-related ILDs- RB-ILD, DIP more than LCH-- no nodules to support [facility]. BOOP> would favor as a post-covid reaction. Sarcoid, HP also possible. Not classic for UIP-- too much GG. HIV negative, no PTA immunosuppression to suggest atypical infection. RVP negative. CT without a classic pattern for ILD-  asymmetric right pleural thickening and RML mass like infiltrate along  with scattered GGO bilaterally  None of her meds stand out as potentially causative. ESR and CRP elevation raises concern for inflammatory etiology. - Autoimmune labs pending ANCA, ANA w/ reflex, CCP.  - Continue  zosyn to complete 7 days - taper oral steroids; if she worsens may need to re-escalate steroids - Encourage IS, OOB mobility - Sputum culture if able to produce one - con't Lasix with elevated BNP - Will need close OP imaging. If this fails to resolve an open lung biopsy may be her only option. - Per previous discussion, she would not want intubation unless it was her last resort. - We will continue to follow  Tobacco, alcohol abuse, inhalational MJ use Chronic suboxone use -strongly recommend tobacco cessation and avoiding inhaling any sort of drug  >50 minutes spent on this encounter, reviewing history, imaging, and labs.  Julian Hy, DO 11/18/20 2:12 PM [facility]

## 2020-11-19 DIAGNOSIS — Z9889 Other specified postprocedural states: Secondary | ICD-10-CM

## 2020-11-19 DIAGNOSIS — F411 Generalized anxiety disorder: Secondary | ICD-10-CM

## 2020-11-19 DIAGNOSIS — I471 Supraventricular tachycardia: Secondary | ICD-10-CM

## 2020-11-19 DIAGNOSIS — I5032 Chronic diastolic (congestive) heart failure: Secondary | ICD-10-CM

## 2020-11-19 LAB — GLUCOSE, CAPILLARY
Glucose-Capillary: 118 mg/dL — ABNORMAL HIGH (ref 70–99)
Glucose-Capillary: 109 mg/dL — ABNORMAL HIGH (ref 70–99)
Glucose-Capillary: 184 mg/dL — ABNORMAL HIGH (ref 70–99)
Glucose-Capillary: 164 mg/dL — ABNORMAL HIGH (ref 70–99)
Glucose-Capillary: 185 mg/dL — ABNORMAL HIGH (ref 70–99)

## 2020-11-19 LAB — BASIC METABOLIC PANEL
Anion gap: 7 (ref 5–15)
BUN: 21 mg/dL — ABNORMAL HIGH (ref 6–20)
CO2: 35 mmol/L — ABNORMAL HIGH (ref 22–32)
Calcium: 9.1 mg/dL (ref 8.9–10.3)
Chloride: 94 mmol/L — ABNORMAL LOW (ref 98–111)
Creatinine, Ser: 0.7 mg/dL (ref 0.44–1.00)
GFR, Estimated: >60 mL/min (ref >60)
Glucose, Bld: 116 mg/dL — ABNORMAL HIGH (ref 70–99)
Potassium: 4.1 mmol/L (ref 3.5–5.1)
Sodium: 136 mmol/L (ref 135–145)

## 2020-11-19 LAB — CBC
HCT: 35.9 % — ABNORMAL LOW (ref 36.0–46.0)
Hemoglobin: 10.2 g/dL — ABNORMAL LOW (ref 12.0–15.0)
MCH: 20.6 pg — ABNORMAL LOW (ref 26.0–34.0)
MCHC: 28.4 g/dL — ABNORMAL LOW (ref 30.0–36.0)
MCV: 72.7 fL — ABNORMAL LOW (ref 80.0–100.0)
Platelets: 548 10*3/uL — ABNORMAL HIGH (ref 150–400)
RBC: 4.94 MIL/uL (ref 3.87–5.11)
RDW: 23 % — ABNORMAL HIGH (ref 11.5–15.5)
WBC: 15 10*3/uL — ABNORMAL HIGH (ref 4.0–10.5)
nRBC: 0.3 % — ABNORMAL HIGH (ref 0.0–0.2)

## 2020-11-19 LAB — ANCA TITERS
Atypical P-ANCA titer: <1:20 {titer}
C-ANCA: <1:20 {titer}
P-ANCA: <1:20 {titer}

## 2020-11-19 LAB — MAGNESIUM: Magnesium: 2.3 mg/dL (ref 1.7–2.4)

## 2020-11-19 LAB — CYCLIC CITRUL PEPTIDE ANTIBODY, IGG/IGA: CCP Antibodies IgG/IgA: 5 units (ref 0–19)

## 2020-11-19 NOTE — TOC Initial Note (Signed)
Transition of Care [facility]) - Initial/Assessment Note    Patient Details  Name: Adriana Hall MRN: [id number] Date of Birth: [birth year]  Transition of [facility]) CM/SW Contact:    Benard Halsted, LCSW Phone Number: 11/19/2020, 11:54 AM  Clinical Narrative:                 CSW received request from RN to see patient. CSW spoke with patient. She requested assistance obtaining her medications and oxygen once she discharges home. CSW made her aware that hospital will be able to assist with those things at discharge. She has completed a disability application with [employer].  Expected Discharge Plan: Home/Self Care Barriers to Discharge: Continued Medical Work up   Patient Goals and CMS Choice Patient states their goals for this hospitalization and ongoing recovery are:: Return home CMS Medicare.gov Compare Post Acute Care list provided to:: Patient Choice offered to / list presented to : Patient  Expected Discharge Plan and Services Expected Discharge Plan: Home/Self Care In-house Referral: PCP / [facility] Discharge Planning Services: CM Consult Post Acute Care Choice: Durable Medical Equipment Living arrangements for the past 2 months: Single Family Home                                      Prior Living Arrangements/Services Living arrangements for the past 2 months: Single Family Home Lives with:: Spouse Patient language and need for interpreter reviewed:: Yes Do you feel safe going back to the place where you live?: Yes      Need for Family Participation in Patient Care: No (Comment) Care giver support system in place?: Yes (comment)   Criminal Activity/Legal Involvement Pertinent to Current Situation/Hospitalization: No - Comment as needed  Activities of Daily Living      Permission Sought/Granted Permission sought to share information with : Facility Art therapist granted to share information with : Yes, Verbal Permission Granted      Permission granted to share info w AGENCY: DME        Emotional Assessment Appearance:: Appears older than stated age Attitude/Demeanor/Rapport: Gracious Affect (typically observed): Accepting, Appropriate, Pleasant Orientation: : Oriented to Self, Oriented to Place, Oriented to  Time, Oriented to Situation Alcohol / Substance Use: Not Applicable Psych Involvement: No (comment)  Admission diagnosis:  Pneumonia [J18.9] Respiratory failure with hypoxia (HCC) [J96.91] Acute respiratory failure with hypoxia (HCC) [J96.01] Acute respiratory failure with hypoxemia (HCC) [J96.01] Patient Active Problem List   Diagnosis Date Noted   Sepsis ([place]) 11/14/2020   Hypokalemia 11/14/2020   Acute respiratory failure with hypoxia ([place]) 11/12/2020   CAP (community acquired pneumonia) 11/12/2020   Chronic respiratory failure with hypoxia ([place]) 11/12/2020   Bipolar 2 disorder ([place]) 03/04/2020   Anxiety 03/04/2020   Alcohol use disorder, severe, dependence ([place]) 12/26/2019   PTSD (post-traumatic stress disorder) 12/26/2019   GAD (generalized anxiety disorder) 05/21/2014   Opioid dependence ([place]) 10/22/2013   IBS (irritable bowel syndrome) - followed by GI 09/10/2013   GERD (gastroesophageal reflux disease) 02/25/2011   Migraines 09/07/2010   Post traumatic stress disorder (PTSD) 09/07/2010   DDD (degenerative disc disease), cervical 09/07/2010   Abnormal Pap smear and cervical HPV (human papillomavirus) 09/07/2010   HYPERTRIGLYCERIDEMIA 07/30/2009   HYPERTENSION, BENIGN 07/30/2009   Coronary atherosclerosis 07/30/2009   WPW 07/30/2009   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   PCP:  Patient, No Pcp Per (Inactive) Pharmacy:   [facility] [address] [place] [address] Phone: [phone] Fax: [phone]     Social Determinants of Health (SDOH) Interventions    Readmission Risk Interventions No flowsheet data found.

## 2020-11-19 NOTE — Plan of Care (Signed)
  Problem: Education: Goal: Knowledge of General Education information will improve Description: Including pain rating scale, medication(s)/side effects and non-pharmacologic comfort measures Outcome: Progressing   Problem: Health Behavior/Discharge Planning: Goal: Ability to manage health-related needs will improve Outcome: Progressing   Problem: Clinical Measurements: Goal: Will remain free from infection Outcome: Progressing   

## 2020-11-19 NOTE — Progress Notes (Signed)
   11/19/20 1000 11/19/20 1100 11/19/20 1206  Oxygen Therapy  SpO2 98 % 95 % 96 %  O2 Device HFNC Nasal Cannula  --   O2 Flow Rate (L/min) 15 L/min 4 L/min  --   FiO2 (%) 40 %  --   --   Patient Activity (if Appropriate) In chair In chair  --     11/19/20 1319  Oxygen Therapy  SpO2 95 %  O2 Device Nasal Cannula  O2 Flow Rate (L/min) 3 L/min  FiO2 (%)  --   Patient Activity (if Appropriate) In chair

## 2020-11-19 NOTE — Progress Notes (Addendum)
NAME:  Adriana Hall, MRN:  [id number], DOB:  04-23-1971, LOS: 7 ADMISSION DATE:  11/12/2020, CONSULTATION DATE:  11/12/20 REFERRING MD:  Adriana Hall, CHIEF COMPLAINT:  SOB   History of Present Illness:  Adriana Hall is a 50 year old woman, daily smoker with history of opioid abuse and several weeks of cough with more recent onset of fevers and hemoptysis.   She was initially admitted under the hospitalist service to a step down unit but she had an episode of desaturations and diaphoresis with using the bed pan and. She was transitioned to [facility] at [place] at 100% PCCM has been consulted for respiratory failure and abnormal CT chest scan.    Significant Hospital Events:  6/9 admitted 6/11 brief run of non-sustained VT overnight. she remains on high dose supplemental oxygen but states she feels improved  6/12 Oxygen requirement and subjective dyspnea improved with addition of steroids. ON had episode of chest tightness and nausea tx with nitroglycerin, ASA, and zofran w/ stable EKG 6/13 thora, only enough fluid for cyto.  6/14 Transferred out of ICU 6/15 Still on 20L, 50% FiO2. BNP elevated. 6/17 Weaned to 2 L [place], BNP 60  Interim History / Subjective:  Afebrile. Remains 2 L [place] with saturation of 96% She is sitting in the chair. She is awake and alert, She is conversant States her breathing is much improved.   Objective   Blood pressure 131/71, pulse 92, temperature 98.1 F (36.7 C), temperature source Oral, resp. rate 13, height 5' 4.5" (1.638 m), weight 84.1 kg, SpO2 97 %.    FiO2 (%):  [50 %] 50 %   Intake/Output Summary (Last 24 hours) at 11/19/2020 0935 Last data filed at 11/19/2020 0530 Gross per 24 hour  Intake 680.08 ml  Output 1500 ml  Net -819.92 ml    Filed Weights   11/12/20 0357 11/17/20 0439  Weight: 81.6 kg 84.1 kg    Examination: Constitutional: chronically ill appearing woman sitting up in the chair, in NAD HEENT: [place]/AT, eyes anicteric Cardiovascular: S1S2, RRR,  No RMG Respiratory: Breathing comfortably on 2 L [place] Left> right rales, left squeaks anteriorly. No R-sided rub. Diminished per bases Gastrointestinal: soft, NT, ND, BS +, Body mass index is 29.99 kg/m. Skin: warm, dry, no rashes, intact Neurologic: Awake, alert, moving all extremities spontaneously. Answering questions appropriately. MSK: no peripheral edema, no c/c Psychiatric: calm, cooperative with exam, pleasant   Labs/imaging that I have personally reviewed   Asymmetric pleural thickening involving only the R hemothorax. GGO in both lungs, somewhat sparing the bases, but extending to the pleura. Some areas of traction bronchiectasis in upper lobes, but no obvious fibrosis.  6/13 pleural fluid> no malignant cells RVP negative (nasal swab) BNP 462.1 CRP 8.0 ESR 50 ACE 41 (WNL) ANCA titers >> C ANCA  < 1:20,  P ANCA  < 1:20,  Atypical P ANCA Titer< 1:20 ANA w/ reflex negative RF 11.5 (negative) CCP 5 (negative) Sputum culture 6/13- inadequate sample CXR 6/17> Patchy airspace disease bilaterally, similar to 2 days prior. No infiltrates , normal pulm vascularity    Echo 6/15: LVEF 60-65%, G1DD, normal RV size and function. Normal RA & LA. IVC normal size and variability. Trivial MR, otherwise normal valves.  WBC 15.5 H/H 10.9/38.1> stable Platelets 527 BUN/ Cr 13/ 0.64 BNP 60.3 Low grade temp with T Max 98.9 Sats are 96% on 2 L  Net - 1400 cc's, but not accurate >> voided x 6, but no volume assessment  Resolved Hospital Problem list   Sepsis  Hypokalemia  Assessment & Plan:   Acute hypoxemic respiratory failure with abnormal CT chest- multilobar bilateral airspace disease, somewhat peripherally predominant. Has had several months worsening SOB, DOE, hemoptysis, subjective fevers, night sweats (attributed to menopause) Starting to have a clinical response to steroids or abx-- feeling better and improving oxygen requirements. Now suspecting more that this is a  steroid-responsive process. At risk for smoking-related ILDs- RB-ILD, DIP more than LCH-- no nodules to support [facility]. BOOP> would favor as a post-covid reaction. Sarcoid, HP also possible. Not classic for UIP-- too much GG. HIV negative, no PTA immunosuppression to suggest atypical infection. RVP negative. CT without a classic pattern for ILD-  asymmetric right pleural thickening and RML mass like infiltrate along with scattered GGO bilaterally  None of her meds stand out as potentially causative. ESR and CRP elevation raises concern for inflammatory etiology. - Autoimmune labs as noted above - Continue zosyn x  7 days.>> completed - Con't steroids-- will need a slow taper.  - Would not decrease below 91m daily.  - Would increase again if she has worsening hypoxia.  -Will need close outpatient follow up and reimaging / CT Chest to determine if she has residual disease and what diagnostics are needed to re-evaluate pulmonary mass once pneumonia has cleared . - Encourage mobility and pulmonary hygiene. - Per previous discussion, she would not want intubation unless it was her last resort. - We will continue to follow - CXR 6/16 is noted above>> no significant change - recheck and trend BNP continues to down trend>>  60.3 on 6/17  Tobacco, alcohol abuse, inhalational MJ use Chronic suboxone use Current every day smoker with a 60 pack year smoking history -Strongly recommend tobacco cessation and avoiding inhaling any sort of drug.   Follow up is  scheduled for   11/30/2020 at 2 pm with Dr. BLamonte Sakaiin one of his Nodule slots at the office.    As she is weaning off her oxygen current  demand is 2 L [place]>> Sats are 96% PCCM will sign off. Please do not hesitate to call if we can be of further assistance.  She is scheduled for follow up in the office with Dr.Byrum  as noted above   SEric Form Hall - [facility] 11/19/20 9:35 AM [facility]

## 2020-11-19 NOTE — Progress Notes (Signed)
PROGRESS NOTE    Adriana Hall  IPJ:[id number] DOB: Apr 24, 1971 DOA: 11/12/2020 PCP: Patient, No Pcp Per (Inactive)     Brief Narrative:  50 year old WF PMHx EtOH abuse, tobacco abuse , opioid abuse, Anxiety, Bipolar disorder and DM-2   Presenting essential HTN, with fever and hemoptysis, and admitted due to hypoxic respiratory failure and respiratory distress in the setting of bilateral multifocal pneumonia versus malignancy.  CTA chest with diffuse groundglass opacity with loculated right-sided pleural effusion and possibility of mass concerning for malignancy.  She had leukocytosis to 20K and elevated D-dimer but PE negative.  She was started on broad-spectrum antibiotics.  Patient was transferred to ICU due to progressive hypoxia and respiratory distress.  She required H HFNC up to 40 L at 100% but no intubation.  Eventually improved, and transferred [facility].  Still on 20 L / 50% FiO2 to maintain appropriate saturation.  She is on IV Zosyn and systemic steroid.  PCCM following.     Subjective: Afebrile overnight sitting in chair, A/O x4, understands that she has a mass in her chest possible malignant.  Understood that she would follow-up with PCCM   Assessment & Plan: Covid vaccination; not vaccinated but would like to be vaccinated   Active Problems:   HYPERTRIGLYCERIDEMIA   HYPERTENSION, BENIGN   Coronary atherosclerosis   SVT/ PSVT/ PAT   GERD (gastroesophageal reflux disease)   GAD (generalized anxiety disorder)   Bipolar 2 disorder (HCC)   Anxiety   Acute respiratory failure with hypoxia ([place])   CAP (community acquired pneumonia)   Chronic respiratory failure with hypoxia (HCC)   Sepsis ([place])   Hypokalemia  Acute hypoxic respiratory failure secondary to bilateral multifocal pneumonia/airspace disease:  -Also concern for possible ILD, inflammatory autoimmune process.  Currently on 20 L HFNC/50% FiO2 to maintain saturation in low 90s.  CXR with  improved right-sided opacity and persistent left lateral opacity. -Complete 7-day course of Zosyn -Transitioned from Solu-Medrol to p.o. prednisone taper -6/13 s/p RIGHT thoracentesis-10 cc cloudy debris drained.  Cytology negative.  No suction samples for micro. 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  Chronic diastolic CHF/moderate AV Regurgitation - Strict in and out - Daily weight  Elevated BNP:  -Likely from fluid resuscitation.  Appears euvolemic. -Echocardiogram consistent with chronic diastolic CHF see results below  NSVT:  -The night of 6/11.  Likely due to #1.   Bipolar disorder with chronic opioid use -Continue Zyprexa and duloxetine.  Continue home Suboxone -Gabapentin  Iron deficiency anemia: H&H relatively stable. Lab Results  Component Value Date   HGB 10.2 (L) 11/19/2020   HGB 9.8 (L) 11/18/2020   HGB 8.8 (L) 11/16/2020   HGB 9.4 (L) 11/14/2020   HGB 9.6 (L) 11/13/2020   -Continue iron supplements and bowel regimen -Monitor H&H intermittently.    Tobacco abuse and alcohol abuse: No withdrawal symptoms. -Counseled on alcohol and tobacco cessation's. -Nicotine patch as needed.     Controlled NIDDM-2 with hyperglycemia and peripheral neuropathy -6/11 hemoglobin A1c= 6.5 -Moderate SSI    Hypokalemia: -Potassium goal> 4   Class I obesity Body mass index is 31.33 kg/m.        DVT prophylaxis: Lovenox Code Status: Full Family Communication:  Status is: Inpatient    Dispo: The patient is  from: Home              Anticipated d/c is to: Home              Anticipated d/c date is: 48 to 72 hours              Patient currently unstable      Consultants:  PCCM  Procedures/Significant Events:   6/9 admitted 6/11 brief run of non-sustained VT overnight. she remains on high dose supplemental oxygen but states she feels improved 6/12 Oxygen requirement and subjective dyspnea improved with addition of steroids. ON had episode of chest tightness and nausea tx with nitroglycerin, ASA, and zofran w/ stable EKG 6/13 s/p RIGHT thoracentesis-10 cc cloudy debris drained.  Only enough fluid for cytology. 6/14 Transferred out of ICU 6/15 Still on 20L, 50% FiO2. BNP elevated. 6/15 Echocardiogram: Left Ventricle: Left ventricular ejection fraction, by estimation, is 60  to 65%. The left ventricle has normal function. The left ventricle has no  regional wall motion abnormalities. The left ventricular internal cavity  size was normal in size.  Grade I diastolic dysfunction (impaired relaxation).  Aortic Valve: Aortic valve regurgitation is moderate.   I have personally reviewed and interpreted all radiology studies and my findings are as above.  VENTILATOR SETTINGS: Nasal cannula 6/16 Flow 2 L/min SPO2 95%    Cultures 6/9 SARS coronavirus negative 6/9 influenza A/B negative 6/14 respiratory virus panel negative   Antimicrobials: Anti-infectives (From admission, onward)    Start     Ordered Stop   11/13/20 1400  piperacillin-tazobactam (ZOSYN) IVPB 3.375 g        11/13/20 0925 11/20/20 1359   11/12/20 0700  ceFEPIme (MAXIPIME) 2 g in sodium chloride 0.9 % 100 mL IVPB  Status:  Discontinued        11/12/20 0655 11/13/20 0925   11/12/20 0700  vancomycin (VANCOREADY) IVPB 1750 mg/350 mL  Status:  Discontinued        11/12/20 0655 11/13/20 0925   11/12/20 0645  metroNIDAZOLE (FLAGYL) IVPB 500 mg  Status:  Discontinued        11/12/20 0634 11/13/20 0925   11/12/20 0315  cefTRIAXone (ROCEPHIN) 2 g in sodium chloride 0.9 % 100 mL IVPB  Status:  Discontinued        11/12/20 0306 11/12/20 0625   11/12/20 0315  azithromycin (ZITHROMAX) 500 mg in sodium chloride 0.9 % 250 mL IVPB         11/12/20 0306 11/16/20 0444         Devices    LINES / TUBES:      Continuous Infusions:  sodium chloride Stopped (11/15/20 0508)   piperacillin-tazobactam (ZOSYN)  IV 3.375 g (11/19/20 1314)     Objective: Vitals:   11/19/20 1319 11/19/20 1646 11/19/20 1717 11/19/20 1743  BP:  (!) 108/57    Pulse: 85 83 78 75  Resp: 20 16 18 20   Temp:  98 F (36.7 C)    TempSrc:  Oral    SpO2: 95% 96% 97% 95%  Weight:      Height:        Intake/Output Summary (Last 24 hours) at 11/19/2020 1803 Last data filed at 11/19/2020 1314 Gross per 24 hour  Intake 1040.08 ml  Output 300 ml  Net 740.08 ml   Filed Weights   11/12/20 0357 11/17/20 0439  Weight: 81.6 kg 84.1 kg    Examination:  General: A/O x4, positive acute respiratory distress Eyes: negative scleral  hemorrhage, negative anisocoria, negative icterus ENT: Negative Runny nose, negative gingival bleeding, Neck:  Negative scars, masses, torticollis, lymphadenopathy, JVD Lungs: decreased breath sounds left lung field, little to absent breath sounds right lung field.  Cardiovascular: Regular rate and rhythm without murmur gallop or rub normal S1 and S2 Abdomen: negative abdominal pain, nondistended, positive soft, bowel sounds, no rebound, no ascites, no appreciable mass Extremities: No significant cyanosis, clubbing, or edema bilateral lower extremities Skin: Negative rashes, lesions, ulcers Psychiatric:  Negative depression, negative anxiety, negative fatigue, negative mania  Central nervous system:  Cranial nerves II through XII intact, tongue/uvula midline, all extremities muscle strength 5/5, sensation intact throughout,  negative dysarthria, negative expressive aphasia, negative receptive aphasia.  .     Data Reviewed: Care during the described time interval was provided by me .  I have reviewed this patient's available data, including medical history, events of note, physical examination, and all test results as  part of my evaluation.  CBC: Recent Labs  Lab 11/13/20 0144 11/14/20 0354 11/16/20 0023 11/18/20 0146 11/19/20 0049  WBC 25.8* 23.1* 16.9* 14.8* 15.0*  HGB 9.6* 9.4* 8.8* 9.8* 10.2*  HCT 32.2* 32.3* 29.5* 34.4* 35.9*  MCV 69.8* 69.2* 70.1* 71.2* 72.7*  PLT 421* 467* 499* 551* 119*   Basic Metabolic Panel: Recent Labs  Lab 11/13/20 0144 11/14/20 0354 11/16/20 0023 11/18/20 0146 11/19/20 0049  NA 135 135 135 135 136  K 3.4* 3.7 4.1 3.3* 4.1  CL 98 94* 93* 90* 94*  CO2 28 27 34* 32 35*  GLUCOSE 129* 177* 159* 106* 116*  BUN 8 7 17  25* 21*  CREATININE 0.54 0.56 0.54 0.65 0.70  CALCIUM 8.9 8.7* 8.7* 9.4 9.1  MG  --  2.0  --  2.3 2.3   GFR: Estimated Creatinine Clearance: 87.3 mL/min (by C-G formula based on SCr of 0.7 mg/dL). Liver Function Tests: No results for input(s): AST, ALT, ALKPHOS, BILITOT, PROT, ALBUMIN in the last 168 hours. No results for input(s): LIPASE, AMYLASE in the last 168 hours. No results for input(s): AMMONIA in the last 168 hours. Coagulation Profile: No results for input(s): INR, PROTIME in the last 168 hours. Cardiac Enzymes: No results for input(s): CKTOTAL, CKMB, CKMBINDEX, TROPONINI in the last 168 hours. BNP (last 3 results) No results for input(s): PROBNP in the last 8760 hours. HbA1C: No results for input(s): HGBA1C in the last 72 hours. CBG: Recent Labs  Lab 11/18/20 2058 11/19/20 0414 11/19/20 0821 11/19/20 1205 11/19/20 1650  GLUCAP 167* 118* 109* 184* 164*   Lipid Profile: No results for input(s): CHOL, HDL, LDLCALC, TRIG, CHOLHDL, LDLDIRECT in the last 72 hours. Thyroid Function Tests: Recent Labs    11/17/20 0757  TSH 0.506   Anemia Panel: No results for input(s): VITAMINB12, FOLATE, FERRITIN, TIBC, IRON, RETICCTPCT in the last 72 hours. Sepsis Labs: Recent Labs  Lab 11/13/20 0144  PROCALCITON 1.44    Recent Results (from the past 240 hour(s))  Culture, blood (routine x 2)     Status: None   Collection Time:  11/12/20  2:11 AM   Specimen: BLOOD  Result Value Ref Range Status   Specimen Description BLOOD RIGHT ANTECUBITAL  Final   Special Requests   Final    BOTTLES DRAWN AEROBIC AND ANAEROBIC Blood Culture adequate volume   Culture   Final    NO GROWTH 5 DAYS Performed at [facility], [address] [place], [place] [address]    Report Status 11/17/2020 FINAL  Final  Culture, blood (routine x 2)     Status: None   Collection Time: 11/12/20  2:15 AM   Specimen: BLOOD LEFT HAND  Result Value Ref Range Status   Specimen Description BLOOD LEFT HAND  Final   Special Requests   Final    BOTTLES DRAWN AEROBIC AND ANAEROBIC Blood Culture results may not be optimal due to an inadequate volume of blood received in culture bottles   Culture   Final    NO GROWTH 5 DAYS Performed at [facility], [place] [address] [place], [place] [address]    Report Status 11/17/2020 FINAL  Final  Resp Panel by RT-PCR (Flu A&B, Covid) Nasopharyngeal Swab     Status: None   Collection Time: 11/12/20  2:34 AM   Specimen: Nasopharyngeal Swab; Nasopharyngeal(NP) swabs in vial transport medium  Result Value Ref Range Status   SARS Coronavirus 2 by RT PCR NEGATIVE NEGATIVE Final    Comment: (NOTE) SARS-CoV-2 target nucleic acids are NOT DETECTED.  The SARS-CoV-2 RNA is generally detectable in upper respiratory specimens during the acute phase of infection. The lowest concentration of SARS-CoV-2 viral copies this assay can detect is 138 copies/mL. A negative result does not preclude SARS-Cov-2 infection and should not be used as the sole basis for treatment or other patient management decisions. A negative result may occur with  improper specimen collection/handling, submission of specimen other than nasopharyngeal swab, presence of viral mutation(s) within the areas targeted by this assay, and inadequate number of viral copies(<138 copies/mL). A negative result must be combined with clinical  observations, patient history, and epidemiological information. The expected result is Negative.  Fact Sheet for Patients:  EntrepreneurPulse.com.au  Fact Sheet for Healthcare Providers:  IncredibleEmployment.be  This test is no t yet approved or cleared by the [place] FDA and  has been authorized for detection and/or diagnosis of SARS-CoV-2 by FDA under an Emergency Use Authorization (EUA). This EUA will remain  in effect (meaning this test can be used) for the duration of the COVID-19 declaration under Section 564(b)(1) of the Act, 21 U.S.C.section 360bbb-3(b)(1), unless the authorization is terminated  or revoked sooner.       Influenza A by PCR NEGATIVE NEGATIVE Final   Influenza B by PCR NEGATIVE NEGATIVE Final    Comment: (NOTE) The Xpert Xpress SARS-CoV-2/FLU/RSV plus assay is intended as an aid in the diagnosis of influenza from Nasopharyngeal swab specimens and should not be used as a sole basis for treatment. Nasal washings and aspirates are unacceptable for Xpert Xpress SARS-CoV-2/FLU/RSV testing.  Fact Sheet for Patients: EntrepreneurPulse.com.au  Fact Sheet for Healthcare Providers: IncredibleEmployment.be  This test is not yet approved or cleared by the [place] FDA and has been authorized for detection and/or diagnosis of SARS-CoV-2 by FDA under an Emergency Use Authorization (EUA). This EUA will remain in effect (meaning this test can be used) for the duration of the COVID-19 declaration under Section 564(b)(1) of the Act, 21 U.S.C. section 360bbb-3(b)(1), unless the authorization is terminated or revoked.  Performed at [facility], [place] [address], [place],  [address]   MRSA PCR Screening     Status: None   Collection Time: 11/12/20  6:36 AM   Specimen: Nasopharyngeal  Result Value Ref Range Status   MRSA by PCR NEGATIVE NEGATIVE Final    Comment:         The GeneXpert MRSA Assay (FDA approved for NASAL specimens only), is one component of a comprehensive MRSA colonization  surveillance program. It is not intended to diagnose MRSA infection nor to guide or monitor treatment for MRSA infections. Performed at [facility], [place] [address] [place], [place] [address]   Urine culture     Status: None   Collection Time: 11/13/20  4:10 PM   Specimen: Urine, Random  Result Value Ref Range Status   Specimen Description URINE, RANDOM  Final   Special Requests NONE  Final   Culture   Final    NO GROWTH Performed at [facility], [place] [address] [place], [place] [address]    Report Status 11/15/2020 FINAL  Final  Expectorated Sputum Assessment w Gram Stain, Rflx to Resp Cult     Status: None   Collection Time: 11/16/20  6:24 AM   Specimen: Expectorated Sputum  Result Value Ref Range Status   Specimen Description EXPECTORATED SPUTUM  Final   Special Requests NONE  Final   Sputum evaluation   Final    Sputum specimen not acceptable for testing.  Please recollect.   RESULT CALLED TO, READ BACK BY AND VERIFIED WITH: RN A.CRUSE AT 0916 ON 11/18/2020 BY T.SAAD. Performed at [facility], [place] [address], [place], [place] [address]    Report Status 11/18/2020 FINAL  Final  Respiratory (~20 pathogens) panel by PCR     Status: None   Collection Time: 11/17/20  3:44 PM   Specimen: Nasopharyngeal Swab; Respiratory  Result Value Ref Range Status   Adenovirus NOT DETECTED NOT DETECTED Final   Coronavirus 229E NOT DETECTED NOT DETECTED Final    Comment: (NOTE) The Coronavirus on the Respiratory Panel, DOES NOT test for the novel  Coronavirus (2019 nCoV)    Coronavirus HKU1 NOT DETECTED NOT DETECTED Final   Coronavirus NL63 NOT DETECTED NOT DETECTED Final   Coronavirus OC43 NOT DETECTED NOT DETECTED Final   Metapneumovirus NOT DETECTED NOT DETECTED Final   Rhinovirus / Enterovirus NOT DETECTED NOT DETECTED Final   Influenza A  NOT DETECTED NOT DETECTED Final   Influenza B NOT DETECTED NOT DETECTED Final   Parainfluenza Virus 1 NOT DETECTED NOT DETECTED Final   Parainfluenza Virus 2 NOT DETECTED NOT DETECTED Final   Parainfluenza Virus 3 NOT DETECTED NOT DETECTED Final   Parainfluenza Virus 4 NOT DETECTED NOT DETECTED Final   Respiratory Syncytial Virus NOT DETECTED NOT DETECTED Final   Bordetella pertussis NOT DETECTED NOT DETECTED Final   Bordetella Parapertussis NOT DETECTED NOT DETECTED Final   Chlamydophila pneumoniae NOT DETECTED NOT DETECTED Final   Mycoplasma pneumoniae NOT DETECTED NOT DETECTED Final    Comment: Performed at [facility], [place]. [address] [place], [place] [address]         Radiology Studies: DG Chest Port 1 View  Result Date: 11/18/2020 CLINICAL DATA:  Respiratory failure EXAM: PORTABLE CHEST 1 VIEW COMPARISON:  November 16, 2020 FINDINGS: There is airspace opacity in the left mid lung and both lower lung regions. No consolidation. Heart is mildly enlarged, stable. The pulmonary vascularity is normal. No adenopathy no bone lesions. IMPRESSION: Airspace opacity in the left mid lung and both lower lung regions. Question atypical organism pneumonia. Check of COVID-19 status advised in this regard. Stable cardiac prominence. Electronically Signed   By: Lowella Grip III M.D.   On: 11/18/2020 07:58   ECHOCARDIOGRAM COMPLETE  Result Date: 11/18/2020    ECHOCARDIOGRAM REPORT   Patient Name:   Adriana Hall Date of Exam: 11/18/2020 Medical Rec #:  [id number]  Height:       64.5 in Accession #:    [id number]       Weight:       185.4 lb Date of Birth:  1970-07-10         BSA:          1.905 m Patient Age:    76 years         BP:           112/60 mmHg Patient Gender: F                HR:           78 bpm. Exam Location:  Inpatient Procedure: 2D Echo, Cardiac Doppler and Color Doppler Indications:    Elevated Brain Natriuretic Peptide BNP level [id number]  History:        Patient has no prior  history of Echocardiogram examinations.                 Risk Factors:Hypertension. Arrythmia.  Sonographer:    Tiffany Dance Referring Phys: [id number] Charlesetta Ivory GONFA IMPRESSIONS  1. The aortic valve is tricuspid. Aortic valve regurgitation is moderate. No aortic stenosis is present. Aortic regurgitation PHT measures 444 msec.  2. Left ventricular ejection fraction, by estimation, is 60 to 65%. The left ventricle has normal function. The left ventricle has no regional wall motion abnormalities. Left ventricular diastolic parameters are consistent with Grade I diastolic dysfunction (impaired relaxation).  3. Right ventricular systolic function is normal. The right ventricular size is normal. There is normal pulmonary artery systolic pressure. The estimated right ventricular systolic pressure is 62.9 mmHg.  4. The mitral valve is grossly normal. Trivial mitral valve regurgitation. No evidence of mitral stenosis.  5. The inferior vena cava is normal in size with greater than 50% respiratory variability, suggesting right atrial pressure of 3 mmHg. Comparison(s): No prior Echocardiogram. FINDINGS  Left Ventricle: Left ventricular ejection fraction, by estimation, is 60 to 65%. The left ventricle has normal function. The left ventricle has no regional wall motion abnormalities. The left ventricular internal cavity size was normal in size. There is  no left ventricular hypertrophy. Left ventricular diastolic parameters are consistent with Grade I diastolic dysfunction (impaired relaxation). Right Ventricle: The right ventricular size is normal. No increase in right ventricular wall thickness. Right ventricular systolic function is normal. There is normal pulmonary artery systolic pressure. The tricuspid regurgitant velocity is 2.44 m/s, and  with an assumed right atrial pressure of 3 mmHg, the estimated right ventricular systolic pressure is 47.6 mmHg. Left Atrium: Left atrial size was normal in size. Right Atrium: Right atrial  size was normal in size. Pericardium: There is no evidence of pericardial effusion. Mitral Valve: The mitral valve is grossly normal. Trivial mitral valve regurgitation. No evidence of mitral valve stenosis. Tricuspid Valve: The tricuspid valve is grossly normal. Tricuspid valve regurgitation is trivial. No evidence of tricuspid stenosis. Aortic Valve: The aortic valve is tricuspid. Aortic valve regurgitation is moderate. Aortic regurgitation PHT measures 444 msec. No aortic stenosis is present. Pulmonic Valve: The pulmonic valve was not well visualized. Pulmonic valve regurgitation is not visualized. No evidence of pulmonic stenosis. Aorta: The aortic root and ascending aorta are structurally normal, with no evidence of dilitation. Venous: The inferior vena cava is normal in size with greater than 50% respiratory variability, suggesting right atrial pressure of 3 mmHg. IAS/Shunts: The atrial septum is grossly normal.  LEFT VENTRICLE PLAX 2D LVIDd:  5.10 cm Diastology LVIDs:         2.90 cm LV e' medial:    4.46 cm/s LV PW:         1.10 cm LV E/e' medial:  13.0 LV IVS:        0.90 cm LV e' lateral:   8.16 cm/s                        LV E/e' lateral: 7.1  RIGHT VENTRICLE             IVC RV Basal diam:  3.90 cm     IVC diam: 1.80 cm RV Mid diam:    2.80 cm RV S prime:     16.10 cm/s TAPSE (M-mode): 2.4 cm LEFT ATRIUM             Index       RIGHT ATRIUM           Index LA diam:        4.90 cm 2.57 cm/m  RA Area:     17.10 cm LA Vol (A2C):   54.8 ml 28.77 ml/m RA Volume:   48.50 ml  25.47 ml/m LA Vol (A4C):   63.6 ml 33.39 ml/m LA Biplane Vol: 61.2 ml 32.13 ml/m  AORTIC VALVE LVOT Vmax:   133.50 cm/s LVOT Vmean:  86.600 cm/s LVOT VTI:    0.249 m AI PHT:      444 msec  AORTA Ao Asc diam: 3.70 cm MITRAL VALVE               TRICUSPID VALVE MV Area (PHT): 3.27 cm    TR Peak grad:   23.8 mmHg MV Decel Time: 232 msec    TR Vmax:        244.00 cm/s MV E velocity: 58.20 cm/s MV A velocity: 85.90 cm/s  SHUNTS MV  E/A ratio:  0.68        Systemic VTI: 0.25 m Rudean Haskell MD Electronically signed by Rudean Haskell MD Signature Date/Time: 11/18/2020/4:06:16 PM    Final         Scheduled Meds:  arformoterol  15 mcg Nebulization BID   budesonide (PULMICORT) nebulizer solution  0.25 mg Nebulization BID   buprenorphine-naloxone  1 tablet Sublingual BID   Chlorhexidine Gluconate Cloth  6 each Topical Daily   diphenhydrAMINE  50 mg Oral QHS   DULoxetine  30 mg Oral Daily   DULoxetine  60 mg Oral Daily   enoxaparin (LOVENOX) injection  40 mg Subcutaneous Q24H   feeding supplement  237 mL Oral BID BM   ferrous sulfate  325 mg Oral Q breakfast   folic acid  1 mg Oral Daily   gabapentin  400 mg Oral TID   insulin aspart  0-15 Units Subcutaneous TID WC   mouth rinse  15 mL Mouth Rinse BID   multivitamins with iron  1 tablet Oral Daily   nicotine  21 mg Transdermal Daily   nystatin  5 mL Mouth/Throat QID   OLANZapine  2.5 mg Oral QHS   omeprazole  40 mg Oral Daily   predniSONE  40 mg Oral Q breakfast   Followed by   Derrill Memo ON 11/25/2020] predniSONE  20 mg Oral Q breakfast   revefenacin  175 mcg Nebulization Daily   vitamin B-12  100 mcg Oral Daily   Continuous Infusions:  sodium chloride Stopped (11/15/20 0508)   piperacillin-tazobactam (ZOSYN)  IV 3.375 g (11/19/20  1314)     LOS: 7 days    Time spent:40 min    Charie Pinkus, Geraldo Docker, MD [facility]   If 7PM-7AM, please contact night-coverage 11/19/2020, 6:03 PM

## 2020-11-19 NOTE — Plan of Care (Signed)
  Problem: Education: Goal: Knowledge of General Education information will improve Description Including pain rating scale, medication(s)/side effects and non-pharmacologic comfort measures Outcome: Progressing   Problem: Health Behavior/Discharge Planning: Goal: Ability to manage health-related needs will improve Outcome: Progressing   

## 2020-11-20 ENCOUNTER — Inpatient Hospital Stay (HOSPITAL_COMMUNITY): Payer: Medicaid Other

## 2020-11-20 DIAGNOSIS — E781 Pure hyperglyceridemia: Secondary | ICD-10-CM

## 2020-11-20 DIAGNOSIS — I351 Nonrheumatic aortic (valve) insufficiency: Secondary | ICD-10-CM

## 2020-11-20 DIAGNOSIS — R918 Other nonspecific abnormal finding of lung field: Secondary | ICD-10-CM

## 2020-11-20 DIAGNOSIS — F1010 Alcohol abuse, uncomplicated: Secondary | ICD-10-CM

## 2020-11-20 LAB — CBC WITH DIFFERENTIAL/PLATELET
Abs Immature Granulocytes: 0 10*3/uL (ref 0.00–0.07)
Basophils Absolute: 0 10*3/uL (ref 0.0–0.1)
Basophils Relative: 0 %
Eosinophils Absolute: 0.3 10*3/uL (ref 0.0–0.5)
Eosinophils Relative: 2 %
HCT: 38.1 % (ref 36.0–46.0)
Hemoglobin: 10.9 g/dL — ABNORMAL LOW (ref 12.0–15.0)
Lymphocytes Relative: 15 %
Lymphs Abs: 2.3 10*3/uL (ref 0.7–4.0)
MCH: 21 pg — ABNORMAL LOW (ref 26.0–34.0)
MCHC: 28.6 g/dL — ABNORMAL LOW (ref 30.0–36.0)
MCV: 73.6 fL — ABNORMAL LOW (ref 80.0–100.0)
Monocytes Absolute: 0.3 10*3/uL (ref 0.1–1.0)
Monocytes Relative: 2 %
Neutro Abs: 12.6 10*3/uL — ABNORMAL HIGH (ref 1.7–7.7)
Neutrophils Relative %: 81 %
Platelets: 527 10*3/uL — ABNORMAL HIGH (ref 150–400)
RBC: 5.18 MIL/uL — ABNORMAL HIGH (ref 3.87–5.11)
RDW: 23.9 % — ABNORMAL HIGH (ref 11.5–15.5)
WBC: 15.5 10*3/uL — ABNORMAL HIGH (ref 4.0–10.5)
nRBC: 0 /100 WBC
nRBC: 0.1 % (ref 0.0–0.2)

## 2020-11-20 LAB — COMPREHENSIVE METABOLIC PANEL
ALT: 51 U/L — ABNORMAL HIGH (ref 0–44)
AST: 20 U/L (ref 15–41)
Albumin: 2.7 g/dL — ABNORMAL LOW (ref 3.5–5.0)
Alkaline Phosphatase: 119 U/L (ref 38–126)
Anion gap: 9 (ref 5–15)
BUN: 13 mg/dL (ref 6–20)
CO2: 35 mmol/L — ABNORMAL HIGH (ref 22–32)
Calcium: 9.5 mg/dL (ref 8.9–10.3)
Chloride: 94 mmol/L — ABNORMAL LOW (ref 98–111)
Creatinine, Ser: 0.64 mg/dL (ref 0.44–1.00)
GFR, Estimated: >60 mL/min (ref >60)
Glucose, Bld: 112 mg/dL — ABNORMAL HIGH (ref 70–99)
Potassium: 3.9 mmol/L (ref 3.5–5.1)
Sodium: 138 mmol/L (ref 135–145)
Total Bilirubin: 0.5 mg/dL (ref 0.3–1.2)
Total Protein: 7 g/dL (ref 6.5–8.1)

## 2020-11-20 LAB — MAGNESIUM: Magnesium: 2.5 mg/dL — ABNORMAL HIGH (ref 1.7–2.4)

## 2020-11-20 LAB — GLUCOSE, CAPILLARY
Glucose-Capillary: 114 mg/dL — ABNORMAL HIGH (ref 70–99)
Glucose-Capillary: 171 mg/dL — ABNORMAL HIGH (ref 70–99)
Glucose-Capillary: 219 mg/dL — ABNORMAL HIGH (ref 70–99)
Glucose-Capillary: 227 mg/dL — ABNORMAL HIGH (ref 70–99)

## 2020-11-20 LAB — BRAIN NATRIURETIC PEPTIDE: B Natriuretic Peptide: 60.3 pg/mL (ref 0.0–100.0)

## 2020-11-20 LAB — PHOSPHORUS: Phosphorus: 3.9 mg/dL (ref 2.5–4.6)

## 2020-11-20 NOTE — Progress Notes (Signed)
PROGRESS NOTE    Adriana Hall  HAL:[id number] DOB: 1971/01/09 DOA: 11/12/2020 PCP: Patient, No Pcp Per (Inactive)     Brief Narrative:  50 year old WF PMHx EtOH abuse, tobacco abuse , opioid abuse, Anxiety, Bipolar disorder and DM-2   Presenting essential HTN, with fever and hemoptysis, and admitted due to hypoxic respiratory failure and respiratory distress in the setting of bilateral multifocal pneumonia versus malignancy.  CTA chest with diffuse groundglass opacity with loculated right-sided pleural effusion and possibility of mass concerning for malignancy.  She had leukocytosis to 20K and elevated D-dimer but PE negative.  She was started on broad-spectrum antibiotics.  Patient was transferred to ICU due to progressive hypoxia and respiratory distress.  She required H HFNC up to 40 L at 100% but no intubation.  Eventually improved, and transferred back to [facility].  Still on 20 L / 50% FiO2 to maintain appropriate saturation.  She is on IV Zosyn and systemic steroid.  PCCM following.     Subjective: 6/17 afebrile overnight, A/O x4, sitting comfortably in chair.  Understands will have to go home on O2.  Has been provided contact information from Dr. Baltazar Apo, PCCM for follow-up to determine exactly when she will require biopsy of lung mass.   Assessment & Plan: Covid vaccination; not vaccinated but would like to be vaccinated,   Active Problems:   HYPERTRIGLYCERIDEMIA   HYPERTENSION, BENIGN   Coronary atherosclerosis   SVT/ PSVT/ PAT   GERD (gastroesophageal reflux disease)   GAD (generalized anxiety disorder)   Alcohol use disorder, severe, dependence ([place])   Bipolar 2 disorder (HCC)   Anxiety   Acute respiratory failure with hypoxia (HCC)   CAP (community acquired pneumonia)   Chronic respiratory failure with hypoxia ([place])   Sepsis ([place])   Hypokalemia   Mass of right lung   Chronic diastolic CHF (congestive heart failure) (HCC)   Aortic valve  regurgitation   Tobacco abuse   Alcohol abuse  Acute hypoxic respiratory failure secondary to bilateral multifocal pneumonia/airspace disease:  -Also concern for possible ILD, inflammatory autoimmune process.  Currently on 20 L HFNC/50% FiO2 to maintain saturation in low 90s.  CXR with improved right-sided opacity and persistent left lateral opacity. -Complete 7-day course of Zosyn -Transitioned from Solu-Medrol to p.o. prednisone taper -6/13 s/p RIGHT thoracentesis-10 cc cloudy debris drained.  Cytology negative.  No suction samples for micro. -Bronchodilators, I-S, flutter valve -Pulmonary following -Follow-up autoimmune labs and limited TTE -6/17 PCXR: Stable to slightly improved pulmonary infiltrates see results below -6/17SATURATION QUALIFICATIONS: (This note is used to comply with regulatory documentation for home oxygen) Patient Saturations on Room Air at Rest = 94% Patient Saturations on Room Air while Ambulating = 86% Patient Saturations on 2 Liters of oxygen while Ambulating = 92% Please briefly explain why patient needs home oxygen: Needs O2 for walking/doing activities -Patient meets criteria for home O2 - 2 L O2 titrate to maintain SPO2> 92% -Provide [employer]ogen double home O2 concentrator -Follow-up at [facility].  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  Chronic diastolic CHF/moderate AV Regurgitation - Strict in and out - Daily weight  Elevated BNP:  -Likely from fluid resuscitation.  Appears euvolemic. -Echocardiogram consistent with chronic diastolic CHF see results below  NSVT:  -The night of 6/11.  Likely due to #1.   Bipolar disorder with chronic opioid use -Continue  Zyprexa and duloxetine.  Continue home Suboxone -Gabapentin  Iron deficiency anemia: H&H relatively stable. Lab Results  Component Value Date   HGB 11.0 (L) 11/21/2020   HGB 10.9 (L) 11/20/2020   HGB 10.2 (L) 11/19/2020   HGB 9.8 (L) 11/18/2020   HGB 8.8 (L) 11/16/2020  -Continue iron supplements and bowel regimen -Monitor H&H intermittently.    Tobacco abuse and alcohol abuse: No withdrawal symptoms. -Counseled on alcohol and tobacco cessation's. -Nicotine patch as needed.     Controlled NIDDM-2 with hyperglycemia and peripheral neuropathy -6/11 hemoglobin A1c= 6.5 -Moderate SSI    Hypokalemia: -Potassium goal> 4   Class I obesity Body mass index is 31.33 kg/m.        DVT prophylaxis: Lovenox Code Status: Full 6/17 husband at bedside for discussion of plan of care all questions answered Family Communication:  Status is: Inpatient    Dispo: The patient is from: Home              Anticipated d/c is to: Home              Anticipated d/c date is: 6/18              Patient currently stable      Consultants:  PCCM  Procedures/Significant Events:  6/9 admitted 6/11 brief run of non-sustained VT overnight. she remains on high dose supplemental oxygen but states she feels improved 6/12 Oxygen requirement and subjective dyspnea improved with addition of steroids. ON had episode of chest tightness and nausea tx with nitroglycerin, ASA, and zofran w/ stable EKG 6/13 s/p RIGHT thoracentesis-10 cc cloudy debris drained.  Only enough fluid for cytology. 6/14 Transferred out of ICU 6/15 Still on 20L, 50% FiO2. BNP elevated. 6/15 Echocardiogram: Left Ventricle: Left ventricular ejection fraction, by estimation, is 60  to 65%. The left ventricle has normal function. The left ventricle has no  regional wall motion abnormalities. The left ventricular internal cavity  size was normal in size.  Grade I diastolic dysfunction (impaired relaxation).  Aortic Valve: Aortic  valve regurgitation is moderate.  6/17 PCXR;Patchy airspace disease bilaterally, similar to 2 days prior. No evident new opacity. Stable cardiac prominence.  I have personally reviewed and interpreted all radiology studies and my findings are as above.  VENTILATOR SETTINGS: Nasal cannula 6/17 Flow 2 L/min SPO2 95%    Cultures 6/9 SARS coronavirus negative 6/9 influenza A/B negative 6/14 respiratory virus panel negative   Antimicrobials: Anti-infectives (From admission, onward)    Start     Ordered Stop   11/13/20 1400  piperacillin-tazobactam (ZOSYN) IVPB 3.375 g        11/13/20 0925 11/20/20 1359   11/12/20 0700  ceFEPIme (MAXIPIME) 2 g in sodium chloride 0.9 % 100 mL IVPB  Status:  Discontinued        11/12/20 0655 11/13/20 0925   11/12/20 0700  vancomycin (VANCOREADY) IVPB 1750 mg/350 mL  Status:  Discontinued        11/12/20 0655 11/13/20 0925   11/12/20 0645  metroNIDAZOLE (FLAGYL) IVPB 500 mg  Status:  Discontinued        11/12/20 0634 11/13/20 0925   11/12/20 0315  cefTRIAXone (ROCEPHIN) 2 g in sodium chloride 0.9 % 100 mL IVPB  Status:  Discontinued        11/12/20 0306 11/12/20 0625   11/12/20 0315  azithromycin (ZITHROMAX) 500  mg in sodium chloride 0.9 % 250 mL IVPB        11/12/20 0306 11/16/20 0444         Devices    LINES / TUBES:      Continuous Infusions:  sodium chloride Stopped (11/15/20 0508)     Objective: Vitals:   11/20/20 2350 11/21/20 0343 11/21/20 0734 11/21/20 0908  BP: 131/64 116/64 122/71   Pulse: 74 78 97   Resp: 15 16 12    Temp: 98.7 F (37.1 C) 98.9 F (37.2 C) 98.8 F (37.1 C)   TempSrc: Axillary Axillary Axillary   SpO2: 100% 93% 100% 97%  Weight:      Height:        Intake/Output Summary (Last 24 hours) at 11/21/2020 1014 Last data filed at 11/21/2020 6237 Gross per 24 hour  Intake 240 ml  Output 1 ml  Net 239 ml   Filed Weights   11/17/20 0439 11/19/20 2045 11/20/20 0500  Weight: 84.1 kg 84.9 kg 80.5 kg     Examination:  General: A/O x4, positive acute respiratory distress Eyes: negative scleral hemorrhage, negative anisocoria, negative icterus ENT: Negative Runny nose, negative gingival bleeding, Neck:  Negative scars, masses, torticollis, lymphadenopathy, JVD Lungs: decreased breath sounds left lung field but air movement evident, decreased breath sounds right lung field, however beginning to hear breath sounds..  Cardiovascular: Regular rate and rhythm without murmur gallop or rub normal S1 and S2 Abdomen: negative abdominal pain, nondistended, positive soft, bowel sounds, no rebound, no ascites, no appreciable mass Extremities: No significant cyanosis, clubbing, or edema bilateral lower extremities Skin: Negative rashes, lesions, ulcers Psychiatric:  Negative depression, negative anxiety, negative fatigue, negative mania  Central nervous system:  Cranial nerves II through XII intact, tongue/uvula midline, all extremities muscle strength 5/5, sensation intact throughout,  negative dysarthria, negative expressive aphasia, negative receptive aphasia.  .     Data Reviewed: Care during the described time interval was provided by me .  I have reviewed this patient's available data, including medical history, events of note, physical examination, and all test results as part of my evaluation.  CBC: Recent Labs  Lab 11/16/20 0023 11/18/20 0146 11/19/20 0049 11/20/20 0120 11/21/20 0111  WBC 16.9* 14.8* 15.0* 15.5* 13.8*  NEUTROABS  --   --   --  12.6* 9.1*  HGB 8.8* 9.8* 10.2* 10.9* 11.0*  HCT 29.5* 34.4* 35.9* 38.1 38.3  MCV 70.1* 71.2* 72.7* 73.6* 74.2*  PLT 499* 551* 548* 527* 628*   Basic Metabolic Panel: Recent Labs  Lab 11/16/20 0023 11/18/20 0146 11/19/20 0049 11/20/20 0120 11/21/20 0111  NA 135 135 136 138 136  K 4.1 3.3* 4.1 3.9 3.9  CL 93* 90* 94* 94* 96*  CO2 34* 32 35* 35* 32  GLUCOSE 159* 106* 116* 112* 99  BUN 17 25* 21* 13 9  CREATININE 0.54 0.65 0.70  0.64 0.59  CALCIUM 8.7* 9.4 9.1 9.5 9.3  MG  --  2.3 2.3 2.5* 2.2  PHOS  --   --   --  3.9 4.0   GFR: Estimated Creatinine Clearance: 85.3 mL/min (by C-G formula based on SCr of 0.59 mg/dL). Liver Function Tests: Recent Labs  Lab 11/20/20 0120 11/21/20 0111  AST 20 23  ALT 51* 43  ALKPHOS 119 103  BILITOT 0.5 0.4  PROT 7.0 6.5  ALBUMIN 2.7* 2.7*   No results for input(s): LIPASE, AMYLASE in the last 168 hours. No results for input(s): AMMONIA in the last 168 hours.  Coagulation Profile: No results for input(s): INR, PROTIME in the last 168 hours. Cardiac Enzymes: No results for input(s): CKTOTAL, CKMB, CKMBINDEX, TROPONINI in the last 168 hours. BNP (last 3 results) No results for input(s): PROBNP in the last 8760 hours. HbA1C: No results for input(s): HGBA1C in the last 72 hours. CBG: Recent Labs  Lab 11/20/20 0745 11/20/20 1154 11/20/20 1645 11/20/20 1956 11/21/20 0742  GLUCAP 114* 171* 219* 227* 114*   Lipid Profile: No results for input(s): CHOL, HDL, LDLCALC, TRIG, CHOLHDL, LDLDIRECT in the last 72 hours. Thyroid Function Tests: No results for input(s): TSH, T4TOTAL, FREET4, T3FREE, THYROIDAB in the last 72 hours.  Anemia Panel: No results for input(s): VITAMINB12, FOLATE, FERRITIN, TIBC, IRON, RETICCTPCT in the last 72 hours. Sepsis Labs: No results for input(s): PROCALCITON, LATICACIDVEN in the last 168 hours.   Recent Results (from the past 240 hour(s))  Culture, blood (routine x 2)     Status: None   Collection Time: 11/12/20  2:11 AM   Specimen: BLOOD  Result Value Ref Range Status   Specimen Description BLOOD RIGHT ANTECUBITAL  Final   Special Requests   Final    BOTTLES DRAWN AEROBIC AND ANAEROBIC Blood Culture adequate volume   Culture   Final    NO GROWTH 5 DAYS Performed at [facility], [address], [place], [place] [address]    Report Status 11/17/2020 FINAL  Final  Culture, blood (routine x 2)     Status: None   Collection  Time: 11/12/20  2:15 AM   Specimen: BLOOD LEFT HAND  Result Value Ref Range Status   Specimen Description BLOOD LEFT HAND  Final   Special Requests   Final    BOTTLES DRAWN AEROBIC AND ANAEROBIC Blood Culture results may not be optimal due to an inadequate volume of blood received in culture bottles   Culture   Final    NO GROWTH 5 DAYS Performed at [facility], [place] [address] [place], [place] [address]    Report Status 11/17/2020 FINAL  Final  Resp Panel by RT-PCR (Flu A&B, Covid) Nasopharyngeal Swab     Status: None   Collection Time: 11/12/20  2:34 AM   Specimen: Nasopharyngeal Swab; Nasopharyngeal(NP) swabs in vial transport medium  Result Value Ref Range Status   SARS Coronavirus 2 by RT PCR NEGATIVE NEGATIVE Final    Comment: (NOTE) SARS-CoV-2 target nucleic acids are NOT DETECTED.  The SARS-CoV-2 RNA is generally detectable in upper respiratory specimens during the acute phase of infection. The lowest concentration of SARS-CoV-2 viral copies this assay can detect is 138 copies/mL. A negative result does not preclude SARS-Cov-2 infection and should not be used as the sole basis for treatment or other patient management decisions. A negative result may occur with  improper specimen collection/handling, submission of specimen other than nasopharyngeal swab, presence of viral mutation(s) within the areas targeted by this assay, and inadequate number of viral copies(<138 copies/mL). A negative result must be combined with clinical observations, patient history, and epidemiological information. The expected result is Negative.  Fact Sheet for Patients:  EntrepreneurPulse.com.au  Fact Sheet for Healthcare Providers:  IncredibleEmployment.be  This test is no t yet approved or cleared by the [place] FDA and  has been authorized for detection and/or diagnosis of SARS-CoV-2 by FDA under an Emergency Use Authorization (EUA). This EUA  will remain  in effect (meaning this test can be used) for the duration of the COVID-19 declaration under Section 564(b)(1)  of the Act, 21 U.S.C.section 360bbb-3(b)(1), unless the authorization is terminated  or revoked sooner.       Influenza A by PCR NEGATIVE NEGATIVE Final   Influenza B by PCR NEGATIVE NEGATIVE Final    Comment: (NOTE) The Xpert Xpress SARS-CoV-2/FLU/RSV plus assay is intended as an aid in the diagnosis of influenza from Nasopharyngeal swab specimens and should not be used as a sole basis for treatment. Nasal washings and aspirates are unacceptable for Xpert Xpress SARS-CoV-2/FLU/RSV testing.  Fact Sheet for Patients: EntrepreneurPulse.com.au  Fact Sheet for Healthcare Providers: IncredibleEmployment.be  This test is not yet approved or cleared by the [place] FDA and has been authorized for detection and/or diagnosis of SARS-CoV-2 by FDA under an Emergency Use Authorization (EUA). This EUA will remain in effect (meaning this test can be used) for the duration of the COVID-19 declaration under Section 564(b)(1) of the Act, 21 U.S.C. section 360bbb-3(b)(1), unless the authorization is terminated or revoked.  Performed at [facility], [place] [address], [place], [place] [address]   MRSA PCR Screening     Status: None   Collection Time: 11/12/20  6:36 AM   Specimen: Nasopharyngeal  Result Value Ref Range Status   MRSA by PCR NEGATIVE NEGATIVE Final    Comment:        The GeneXpert MRSA Assay (FDA approved for NASAL specimens only), is one component of a comprehensive MRSA colonization surveillance program. It is not intended to diagnose MRSA infection nor to guide or monitor treatment for MRSA infections. Performed at [facility], [place] [address], [place], [place] [address]   Urine culture     Status: None   Collection Time: 11/13/20  4:10 PM   Specimen: Urine, Random  Result Value Ref Range  Status   Specimen Description URINE, RANDOM  Final   Special Requests NONE  Final   Culture   Final    NO GROWTH Performed at [facility], [place] [address], [place], [place] [address]    Report Status 11/15/2020 FINAL  Final  Expectorated Sputum Assessment w Gram Stain, Rflx to Resp Cult     Status: None   Collection Time: 11/16/20  6:24 AM   Specimen: Expectorated Sputum  Result Value Ref Range Status   Specimen Description EXPECTORATED SPUTUM  Final   Special Requests NONE  Final   Sputum evaluation   Final    Sputum specimen not acceptable for testing.  Please recollect.   RESULT CALLED TO, READ BACK BY AND VERIFIED WITH: RN A.CRUSE AT 0916 ON 11/18/2020 BY T.SAAD. Performed at [facility], [place] [address] [place], [place] [address]    Report Status 11/18/2020 FINAL  Final  Respiratory (~20 pathogens) panel by PCR     Status: None   Collection Time: 11/17/20  3:44 PM   Specimen: Nasopharyngeal Swab; Respiratory  Result Value Ref Range Status   Adenovirus NOT DETECTED NOT DETECTED Final   Coronavirus 229E NOT DETECTED NOT DETECTED Final    Comment: (NOTE) The Coronavirus on the Respiratory Panel, DOES NOT test for the novel  Coronavirus (2019 nCoV)    Coronavirus HKU1 NOT DETECTED NOT DETECTED Final   Coronavirus NL63 NOT DETECTED NOT DETECTED Final   Coronavirus OC43 NOT DETECTED NOT DETECTED Final   Metapneumovirus NOT DETECTED NOT DETECTED Final   Rhinovirus / Enterovirus NOT DETECTED NOT DETECTED Final   Influenza A NOT DETECTED NOT DETECTED Final   Influenza B NOT DETECTED NOT DETECTED  Final   Parainfluenza Virus 1 NOT DETECTED NOT DETECTED Final   Parainfluenza Virus 2 NOT DETECTED NOT DETECTED Final   Parainfluenza Virus 3 NOT DETECTED NOT DETECTED Final   Parainfluenza Virus 4 NOT DETECTED NOT DETECTED Final   Respiratory Syncytial Virus NOT DETECTED NOT DETECTED Final   Bordetella pertussis NOT DETECTED NOT DETECTED Final   Bordetella Parapertussis  NOT DETECTED NOT DETECTED Final   Chlamydophila pneumoniae NOT DETECTED NOT DETECTED Final   Mycoplasma pneumoniae NOT DETECTED NOT DETECTED Final    Comment: Performed at [facility], [place] [address], [place], [place] [address]         Radiology Studies: DG CHEST PORT 1 VIEW  Result Date: 11/20/2020 CLINICAL DATA:  Pneumonia EXAM: PORTABLE CHEST 1 VIEW COMPARISON:  November 18, 2020 FINDINGS: Airspace opacity is again noted in portions of the left mid lung and left base as well as in the right lower lung region. Appearance similar to 1 day prior. No new opacity evident. Heart is mildly enlarged, stable. Pulmonary vascularity normal. No evident adenopathy. Postoperative change noted in lower cervical spine. IMPRESSION: Patchy airspace disease bilaterally, similar to 2 days prior. No evident new opacity. Stable cardiac prominence. Electronically Signed   By: Lowella Grip III M.D.   On: 11/20/2020 07:57        Scheduled Meds:  arformoterol  15 mcg Nebulization BID   budesonide (PULMICORT) nebulizer solution  0.25 mg Nebulization BID   buprenorphine-naloxone  1 tablet Sublingual BID   Chlorhexidine Gluconate Cloth  6 each Topical Daily   diphenhydrAMINE  50 mg Oral QHS   DULoxetine  30 mg Oral Daily   DULoxetine  60 mg Oral Daily   enoxaparin (LOVENOX) injection  40 mg Subcutaneous Q24H   feeding supplement  237 mL Oral BID BM   ferrous sulfate  325 mg Oral Q breakfast   folic acid  1 mg Oral Daily   gabapentin  400 mg Oral TID   insulin aspart  0-15 Units Subcutaneous TID WC   mouth rinse  15 mL Mouth Rinse BID   multivitamins with iron  1 tablet Oral Daily   nicotine  21 mg Transdermal Daily   nystatin  5 mL Mouth/Throat QID   OLANZapine  2.5 mg Oral QHS   omeprazole  40 mg Oral Daily   predniSONE  40 mg Oral Q breakfast   Followed by   Derrill Memo ON 11/25/2020] predniSONE  20 mg Oral Q breakfast   revefenacin  175 mcg Nebulization Daily   vitamin B-12  100 mcg Oral Daily    Continuous Infusions:  sodium chloride Stopped (11/15/20 0508)     LOS: 9 days    Time spent:40 min    Emmily Pellegrin, Geraldo Docker, MD [facility]   If 7PM-7AM, please contact night-coverage 11/21/2020, 10:14 AM

## 2020-11-20 NOTE — Progress Notes (Signed)
SATURATION QUALIFICATIONS: (This note is used to comply with regulatory documentation for home oxygen)  Patient Saturations on Room Air at Rest = 94%  Patient Saturations on Room Air while Ambulating = 86%  Patient Saturations on 2 Liters of oxygen while Ambulating = 92%  Please briefly explain why patient needs home oxygen: Needs O2 for walking/doing activities

## 2020-11-21 DIAGNOSIS — F1010 Alcohol abuse, uncomplicated: Secondary | ICD-10-CM | POA: Diagnosis present

## 2020-11-21 DIAGNOSIS — K219 Gastro-esophageal reflux disease without esophagitis: Secondary | ICD-10-CM

## 2020-11-21 DIAGNOSIS — R918 Other nonspecific abnormal finding of lung field: Secondary | ICD-10-CM | POA: Diagnosis present

## 2020-11-21 DIAGNOSIS — Z720 Tobacco use: Secondary | ICD-10-CM | POA: Diagnosis present

## 2020-11-21 DIAGNOSIS — I351 Nonrheumatic aortic (valve) insufficiency: Secondary | ICD-10-CM | POA: Diagnosis present

## 2020-11-21 DIAGNOSIS — R0902 Hypoxemia: Secondary | ICD-10-CM

## 2020-11-21 DIAGNOSIS — J9611 Chronic respiratory failure with hypoxia: Secondary | ICD-10-CM

## 2020-11-21 DIAGNOSIS — I5032 Chronic diastolic (congestive) heart failure: Secondary | ICD-10-CM | POA: Diagnosis present

## 2020-11-21 DIAGNOSIS — F1020 Alcohol dependence, uncomplicated: Secondary | ICD-10-CM

## 2020-11-21 HISTORY — DX: Other nonspecific abnormal finding of lung field: R91.8

## 2020-11-21 LAB — CBC WITH DIFFERENTIAL/PLATELET
Abs Immature Granulocytes: 0.61 10*3/uL — ABNORMAL HIGH (ref 0.00–0.07)
Basophils Absolute: 0.1 10*3/uL (ref 0.0–0.1)
Basophils Relative: 1 %
Eosinophils Absolute: 0.3 10*3/uL (ref 0.0–0.5)
Eosinophils Relative: 2 %
HCT: 38.3 % (ref 36.0–46.0)
Hemoglobin: 11 g/dL — ABNORMAL LOW (ref 12.0–15.0)
Immature Granulocytes: 4 %
Lymphocytes Relative: 20 %
Lymphs Abs: 2.7 10*3/uL (ref 0.7–4.0)
MCH: 21.3 pg — ABNORMAL LOW (ref 26.0–34.0)
MCHC: 28.7 g/dL — ABNORMAL LOW (ref 30.0–36.0)
MCV: 74.2 fL — ABNORMAL LOW (ref 80.0–100.0)
Monocytes Absolute: 1 10*3/uL (ref 0.1–1.0)
Monocytes Relative: 7 %
Neutro Abs: 9.1 10*3/uL — ABNORMAL HIGH (ref 1.7–7.7)
Neutrophils Relative %: 66 %
Platelets: 490 10*3/uL — ABNORMAL HIGH (ref 150–400)
RBC: 5.16 MIL/uL — ABNORMAL HIGH (ref 3.87–5.11)
RDW: 24.9 % — ABNORMAL HIGH (ref 11.5–15.5)
WBC: 13.8 10*3/uL — ABNORMAL HIGH (ref 4.0–10.5)
nRBC: 0 % (ref 0.0–0.2)

## 2020-11-21 LAB — GLUCOSE, CAPILLARY
Glucose-Capillary: 114 mg/dL — ABNORMAL HIGH (ref 70–99)
Glucose-Capillary: 170 mg/dL — ABNORMAL HIGH (ref 70–99)

## 2020-11-21 LAB — COMPREHENSIVE METABOLIC PANEL
ALT: 43 U/L (ref 0–44)
AST: 23 U/L (ref 15–41)
Albumin: 2.7 g/dL — ABNORMAL LOW (ref 3.5–5.0)
Alkaline Phosphatase: 103 U/L (ref 38–126)
Anion gap: 8 (ref 5–15)
BUN: 9 mg/dL (ref 6–20)
CO2: 32 mmol/L (ref 22–32)
Calcium: 9.3 mg/dL (ref 8.9–10.3)
Chloride: 96 mmol/L — ABNORMAL LOW (ref 98–111)
Creatinine, Ser: 0.59 mg/dL (ref 0.44–1.00)
GFR, Estimated: >60 mL/min (ref >60)
Glucose, Bld: 99 mg/dL (ref 70–99)
Potassium: 3.9 mmol/L (ref 3.5–5.1)
Sodium: 136 mmol/L (ref 135–145)
Total Bilirubin: 0.4 mg/dL (ref 0.3–1.2)
Total Protein: 6.5 g/dL (ref 6.5–8.1)

## 2020-11-21 LAB — MAGNESIUM: Magnesium: 2.2 mg/dL (ref 1.7–2.4)

## 2020-11-21 LAB — PHOSPHORUS: Phosphorus: 4 mg/dL (ref 2.5–4.6)

## 2020-11-21 MED ORDER — NYSTATIN 100000 UNIT/ML MT SUSP: 5.0000 mL | Freq: Four times a day (QID) | OROMUCOSAL | 0 refills | Status: DC

## 2020-11-21 MED ORDER — IPRATROPIUM-ALBUTEROL 20-100 MCG/ACT IN AERS
1.0000 | INHALATION_SPRAY | Freq: Four times a day (QID) | RESPIRATORY_TRACT | 0 refills | Status: DC
Start: 2020-11-21 — End: 2021-01-04

## 2020-11-21 MED ORDER — GUAIFENESIN-DM 100-10 MG/5ML PO SYRP: 5.0000 mL | ORAL_SOLUTION | ORAL | 0 refills | Status: DC | PRN

## 2020-11-21 MED ORDER — NITROGLYCERIN 0.4 MG/SPRAY TL SOLN: 1.0000 | Freq: Once | 0 refills | Status: AC | PRN

## 2020-11-21 MED ORDER — FERROUS SULFATE 325 (65 FE) MG PO TABS: 325.0000 mg | ORAL_TABLET | Freq: Every day | ORAL | 0 refills | Status: DC

## 2020-11-21 MED ORDER — BENZONATATE 100 MG PO CAPS: 100.0000 mg | ORAL_CAPSULE | Freq: Three times a day (TID) | ORAL | 0 refills | Status: DC | PRN

## 2020-11-21 MED ORDER — PREDNISONE 20 MG PO TABS: 40.0000 mg | ORAL_TABLET | Freq: Every day | ORAL | 0 refills | Status: DC

## 2020-11-21 MED ORDER — NICOTINE 21 MG/24HR TD PT24: 21.0000 mg | MEDICATED_PATCH | Freq: Every day | TRANSDERMAL | 0 refills | Status: DC

## 2020-11-21 MED ORDER — PREDNISONE 20 MG PO TABS: 20.0000 mg | ORAL_TABLET | Freq: Every day | ORAL | 0 refills | Status: DC

## 2020-11-21 MED ORDER — CYANOCOBALAMIN 100 MCG PO TABS: 100.0000 ug | ORAL_TABLET | Freq: Every day | ORAL | 0 refills | Status: DC

## 2020-11-21 MED ORDER — ACETAMINOPHEN 325 MG PO TABS: 650.0000 mg | ORAL_TABLET | Freq: Four times a day (QID) | ORAL | 0 refills | Status: AC | PRN

## 2020-11-21 MED ORDER — FOLIC ACID 1 MG PO TABS: 1.0000 mg | ORAL_TABLET | Freq: Every day | ORAL | 0 refills | Status: DC

## 2020-11-21 NOTE — Plan of Care (Signed)

## 2020-11-21 NOTE — TOC Transition Note (Addendum)
Transition of Care [facility]) - CM/SW Discharge Note   Patient Details  Name: Adriana Hall Severe MRN: [id number] Date of Birth: [birth year]  Transition of Care [facility]) CM/SW Contact:  Carles Collet, RN Phone Number: 11/21/2020, 10:52 AM   Clinical Narrative:     Patient for Dc today per MD, will Dc with home oxygen. Referral made to [employer] for charity. Patient asks that they call her spouse Fritz Pickerel for assessment, Jasmine w [employer] provided w Larry's number.  Fritz Pickerel will provide transportation home after oxygen tanks delivered to the room. [employer] will arrange for concentrator to be set up at the house later today with Fritz Pickerel. No HH needs. AVS not completed at this time, will follow for medication needs.  [place] added to AVS for patient to call and make appointment.  [place] letter provided to patient for prescriptions     Final next level of care: Home/Self Care Barriers to Discharge: No Barriers Identified   Patient Goals and CMS Choice Patient states their goals for this hospitalization and ongoing recovery are:: Return home CMS Medicare.gov Compare Post Acute Care list provided to:: Patient Choice offered to / list presented to : Patient  Discharge Placement                       Discharge Plan and Services In-house Referral: PCP / [facility] Discharge Planning Services: CM Consult Post Acute Care Choice: Durable Medical Equipment          DME Arranged: Oxygen DME Agency: [employer] Date DME Agency Contacted: 11/21/20 Time DME Agency Contacted: [phone] Representative spoke with at DME Agency: [place]: NA          Social Determinants of Health ([place]) Interventions     Readmission Risk Interventions No flowsheet data found.

## 2020-11-21 NOTE — Discharge Summary (Signed)
Physician Discharge Summary  Krystyn Picking [place] DTO:[id number] DOB: 29-Sep-1970 DOA: 11/12/2020  PCP: Patient, No Pcp Per (Inactive)  Admit date: 11/12/2020 Discharge date: 11/21/2020  Time spent: 35 minutes  Recommendations for Outpatient Follow-up:   Acute hypoxic respiratory failure secondary to bilateral multifocal pneumonia/airspace disease:  -Also concern for possible ILD, inflammatory autoimmune process.  Currently on 20 L HFNC/50% FiO2 to maintain saturation in low 90s.  CXR with improved right-sided opacity and persistent left lateral opacity. -Complete 7-day course of Zosyn -Transitioned from Solu-Medrol to p.o. prednisone taper -6/13 s/p RIGHT thoracentesis-10 cc cloudy debris drained.  Cytology negative.  No suction samples for micro. -Bronchodilators, I-S, flutter valve -Pulmonary following -Follow-up autoimmune labs and limited TTE -6/17 PCXR: Stable to slightly improved pulmonary infiltrates see results below -6/17SATURATION QUALIFICATIONS: (This note is used to comply with regulatory documentation for home oxygen) Patient Saturations on Room Air at Rest = 94% Patient Saturations on Room Air while Ambulating = 86% Patient Saturations on 2 Liters of oxygen while Ambulating = 92% Please briefly explain why patient needs home oxygen: Needs O2 for walking/doing activities -Patient meets criteria for home O2 - 2 L O2 titrate to maintain SPO2> 92% -Provide Inogen double home O2 concentrator -Follow-up at [facility].  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  Chronic diastolic CHF/moderate AV Regurgitation - Strict in and out - Daily weight   Elevated BNP:  -Likely from fluid resuscitation.   Appears euvolemic. -Echocardiogram consistent with chronic diastolic CHF see results below   NSVT: -The night of 6/11.  Likely due to #1.  Bipolar disorder with chronic opioid use -Continue Zyprexa and duloxetine.  Continue home Suboxone -Gabapentin   Iron deficiency anemia: H&H relatively stable. Lab Results  Component Value Date   HGB 11.0 (L) 11/21/2020   HGB 10.9 (L) 11/20/2020   HGB 10.2 (L) 11/19/2020   HGB 9.8 (L) 11/18/2020   HGB 8.8 (L) 11/16/2020  -Continue iron supplements and bowel regimen -Monitor H&H intermittently.     Tobacco abuse and alcohol abuse: No withdrawal symptoms. -Counseled on alcohol and tobacco cessation's. -Nicotine patch as needed.     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 -Discussed with PCP nutrition/weight loss program   Discharge Diagnoses:  Active Problems:   HYPERTRIGLYCERIDEMIA   HYPERTENSION, BENIGN   Coronary atherosclerosis   SVT/ PSVT/ PAT   GERD (gastroesophageal reflux disease)   GAD (generalized anxiety disorder)   Alcohol use disorder, severe, dependence (HCC)   Bipolar 2 disorder (HCC)   Anxiety   Acute respiratory failure with hypoxia (HCC)   CAP (community acquired pneumonia)   Chronic respiratory failure with hypoxia (HCC)   Sepsis ([place])   Hypokalemia   Mass of right lung   Chronic diastolic CHF (congestive heart failure) (HCC)   Aortic valve regurgitation   Tobacco abuse   Alcohol abuse   Discharge Condition: Stable  Diet recommendation: American diabetic Association/heart healthy  Filed Weights   11/17/20 0439 11/19/20 2045 11/20/20 0500  Weight: 84.1 kg 84.9 kg 80.5 kg    History of present illness:  50 year old WF PMHx EtOH abuse, tobacco abuse ,  opioid abuse, Anxiety, Bipolar disorder and DM-2   Presenting essential HTN, with fever and  hemoptysis, and admitted due to hypoxic respiratory failure and respiratory distress in the setting of bilateral multifocal pneumonia versus malignancy.  CTA chest with diffuse groundglass opacity with loculated right-sided pleural effusion and possibility of mass concerning for malignancy.  She had leukocytosis to 20K and elevated D-dimer but PE negative.  She was started on broad-spectrum antibiotics.  Patient was transferred to ICU due to progressive hypoxia and respiratory distress.  She required H HFNC up to 40 L at 100% but no intubation.  Eventually improved, and transferred back to [facility].  Still on 20 L / 50% FiO2 to maintain appropriate saturation.  She is on IV Zosyn and systemic steroid.  PCCM following.   Hospital Course:  See above  Procedures: 6/9 admitted 6/11 brief run of non-sustained VT overnight. she remains on high dose supplemental oxygen but states she feels improved 6/12 Oxygen requirement and subjective dyspnea improved with addition of steroids. ON had episode of chest tightness and nausea tx with nitroglycerin, ASA, and zofran w/ stable EKG 6/13 s/p RIGHT thoracentesis-10 cc cloudy debris drained.  Only enough fluid for cytology. 6/14 Transferred out of ICU 6/15 Still on 20L, 50% FiO2. BNP elevated. 6/15 Echocardiogram: Left Ventricle: Left ventricular ejection fraction, by estimation, is 60  to 65%. The left ventricle has normal function. The left ventricle has no  regional wall motion abnormalities. The left ventricular internal cavity  size was normal in size. Grade I diastolic dysfunction (impaired relaxation).  Aortic Valve: Aortic valve regurgitation is moderate. 6/17 PCXR;Patchy airspace disease bilaterally, similar to 2 days prior. No evident new opacity. Stable cardiac prominence.  Ventilator settings Nasal cannula 6/18 Flow 2 L/min SPO2 94%  Consultations: PCCM  Cultures  6/9 SARS coronavirus negative 6/9 influenza A/B  negative 6/14 respiratory virus panel negative  Antibiotics Anti-infectives (From admission, onward)    Start     Ordered Stop   11/13/20 1400  piperacillin-tazobactam (ZOSYN) IVPB 3.375 g        11/13/20 0925 11/20/20 2131   11/12/20 0700  ceFEPIme (MAXIPIME) 2 g in sodium chloride 0.9 % 100 mL IVPB  Status:  Discontinued        11/12/20 0655 11/13/20 0925   11/12/20 0700  vancomycin (VANCOREADY) IVPB 1750 mg/350 mL  Status:  Discontinued        11/12/20 0655 11/13/20 0925   11/12/20 0645  metroNIDAZOLE (FLAGYL) IVPB 500 mg  Status:  Discontinued        11/12/20 0634 11/13/20 0925   11/12/20 0315  cefTRIAXone (ROCEPHIN) 2 g in sodium chloride 0.9 % 100 mL IVPB  Status:  Discontinued        11/12/20 0306 11/12/20 0625   11/12/20 0315  azithromycin (ZITHROMAX) 500 mg in sodium chloride 0.9 % 250 mL IVPB        11/12/20 0306 11/16/20 0444         Discharge Exam: Vitals:   11/21/20 0343 11/21/20 0734 11/21/20 0908 11/21/20 1206  BP: 116/64 122/71  (!) 102/45  Pulse: 78 97  87  Resp: 16 12  16   Temp: 98.9 F (37.2 C) 98.8 F (37.1 C)  98.4 F (36.9 C)  TempSrc: Axillary Axillary  Oral  SpO2: 93% 100% 97% 94%  Weight:      Height:        General: A/O x4, positive acute respiratory distress Eyes: negative scleral hemorrhage, negative anisocoria, negative  icterus ENT: Negative Runny nose, negative gingival bleeding, Neck:  Negative scars, masses, torticollis, lymphadenopathy, JVD Lungs: decreased breath sounds left lung field but air movement evident, decreased breath sounds right lung field, however beginning to hear breath sounds.. Cardiovascular: Regular rate and rhythm without murmur gallop or rub normal S1 and S2  Discharge Instructions   Allergies as of 11/21/2020       Reactions   Doxycycline Nausea And Vomiting   Biotin    'Makes me extremely sick'        Medication List     STOP taking these medications    atenolol 50 MG tablet Commonly known as:  TENORMIN   pantoprazole 40 MG tablet Commonly known as: PROTONIX       TAKE these medications    acetaminophen 325 MG tablet Commonly known as: TYLENOL Take 2 tablets (650 mg total) by mouth every 6 (six) hours as needed for mild pain (or Fever >/= 101).   benzonatate 100 MG capsule Commonly known as: TESSALON Take 1 capsule (100 mg total) by mouth 3 (three) times daily as needed for cough.   buprenorphine-naloxone 8-2 mg Subl SL tablet Commonly known as: SUBOXONE Place 1 tablet under the tongue in the morning and at bedtime.   cyanocobalamin 100 MCG tablet Take 1 tablet (100 mcg total) by mouth daily. Start taking on: November 22, 2020   diphenhydrAMINE 25 MG tablet Commonly known as: BENADRYL Take 50 mg by mouth at bedtime.   ESTROVEN PO Take 1 tablet by mouth daily.   ferrous sulfate 325 (65 FE) MG tablet Take 1 tablet (325 mg total) by mouth daily with breakfast. Start taking on: November 22, 8525   folic acid 1 MG tablet Commonly known as: FOLVITE Take 1 tablet (1 mg total) by mouth daily. Start taking on: November 22, 2020   guaiFENesin-dextromethorphan 100-10 MG/5ML syrup Commonly known as: ROBITUSSIN DM Take 5 mLs by mouth every 4 (four) hours as needed for cough.   ibuprofen 200 MG tablet Commonly known as: ADVIL Take 400 mg by mouth every 6 (six) hours as needed for fever or moderate pain.   Ipratropium-Albuterol 20-100 MCG/ACT Aers respimat Commonly known as: COMBIVENT Inhale 1 puff into the lungs every 6 (six) hours.   nicotine 21 mg/24hr patch Commonly known as: NICODERM CQ - dosed in mg/24 hours Place 1 patch (21 mg total) onto the skin daily. Start taking on: November 22, 2020   nitroGLYCERIN 0.4 MG/SPRAY spray Commonly known as: NITROLINGUAL Place 1 spray under the tongue once as needed for chest pain.   nystatin 100000 UNIT/ML suspension Commonly known as: MYCOSTATIN Use as directed 5 mLs (500,000 Units total) in the mouth or throat 4 (four) times  daily.   omeprazole 40 MG capsule Commonly known as: PRILOSEC Take 40 mg by mouth daily.   predniSONE 20 MG tablet Commonly known as: DELTASONE Take 2 tablets (40 mg total) by mouth daily with breakfast. Start taking on: November 22, 2020   predniSONE 20 MG tablet Commonly known as: DELTASONE Take 1 tablet (20 mg total) by mouth daily with breakfast. Start taking on: November 25, 2020       ASK your doctor about these medications    DULoxetine 30 MG capsule Commonly known as: Cymbalta Take 2 capsules (60 mg) in the morning and one capsule (30 mg) in the evening   gabapentin 400 MG capsule Commonly known as: NEURONTIN TAKE 1 CAPSULE (400 MG TOTAL) BY MOUTH 3 (THREE) TIMES DAILY.  hydrOXYzine 25 MG tablet Commonly known as: ATARAX/VISTARIL TAKE 1 TABLET (25 MG TOTAL) BY MOUTH 3 (THREE) TIMES DAILY AS NEEDED FOR ANXIETY.   OLANZapine 2.5 MG tablet Commonly known as: ZYPREXA TAKE 1 TABLET (2.5 MG TOTAL) BY MOUTH AT BEDTIME.   traZODone 100 MG tablet Commonly known as: DESYREL TAKE 2 TABLETS (200 MG TOTAL) BY MOUTH AT BEDTIME AS NEEDED FOR SLEEP.               Durable Medical Equipment  (From admission, onward)           Start     Ordered   11/21/20 1006  For home use only DME oxygen  Once       Comments: SATURATION QUALIFICATIONS: (This note is used to comply with regulatory documentation for home oxygen) Patient Saturations on Room Air at Rest = 94% Patient Saturations on Room Air while Ambulating = 86% Patient Saturations on 2 Liters of oxygen while Ambulating = 92% Please briefly explain why patient needs home oxygen: Needs O2 for walking/doing activities -Patient meets criteria for home O2 - 2 L O2 titrate to maintain SPO2> 92% -Provide Inogen double home O2 concentrator  Question Answer Comment  Length of Need Lifetime   Mode or (Route) Nasal cannula   Liters per Minute 2   Frequency Continuous (stationary and portable oxygen unit needed)   Oxygen  conserving device Yes   Oxygen delivery system Gas      11/21/20 1005           Allergies  Allergen Reactions   Doxycycline Nausea And Vomiting   Biotin     'Makes me extremely sick'    Follow-up [facility]. Schedule an appointment as soon as possible for a visit.   Contact information: [address] [place] [address] [phone]                 The results of significant diagnostics from this hospitalization (including imaging, microbiology, ancillary and laboratory) are listed below for reference.    Significant Diagnostic Studies: CT Angio Chest PE W and/or Wo Contrast  Result Date: 11/12/2020 CLINICAL DATA:  Shortness of breath. Chest pain, hemoptysis. Pulmonary embolus suspected. EXAM: CT ANGIOGRAPHY CHEST WITH CONTRAST TECHNIQUE: Multidetector CT imaging of the chest was performed using the standard protocol during bolus administration of intravenous contrast. Multiplanar CT image reconstructions and MIPs were obtained to evaluate the vascular anatomy. CONTRAST:  76mL OMNIPAQUE IOHEXOL 350 MG/ML SOLN COMPARISON:  Chest x-ray 11/12/2020, chest x-ray 07/17/2019. FINDINGS: Cardiovascular: Satisfactory opacification of the pulmonary arteries to the segmental level. No evidence of pulmonary embolism. The main pulmonary artery is enlarged in caliber measuring up to 3.6 cm. Normal heart size. No significant pericardial effusion. The thoracic aorta is normal in caliber. Mild atherosclerotic plaque of the thoracic aorta. No coronary artery calcifications. Mediastinum/Nodes: Enlarged right lymphadenopathy measuring up to 2.1 cm. Enlarged subcarinal lymph node measuring up to 2.5 cm (5:63). Difficult to measure right paratracheal lymph node. No left hilar or axillary lymph nodes. Thyroid gland, trachea, and esophagus demonstrate no significant findings. Lungs/Pleura: Diffuse patchy ground-glass airspace  opacities. Loculated small volume right pleural effusion. Pulmonary/pleural mass along the right middle lobe not excluded. No pneumothorax. Upper Abdomen: No acute abnormality. Musculoskeletal: No chest wall abnormality. No suspicious lytic or blastic osseous lesions. No acute displaced fracture. Review of the MIP images confirms the above findings. IMPRESSION: 1. No pulmonary embolus. 2. Diffuse  patchy ground-glass airspace opacities with loculated small right pleural effusion. Question a pulmonary/pleural mass along the right middle lobe. Associated right hilar and mediastinal lymphadenopathy. Limited evaluation due to timing of contrast. Findings concerning for malignancy with superimposed infection. 3. Pulmonary hypertension. Electronically Signed   By: Iven Finn M.D.   On: 11/12/2020 05:01   CT ABDOMEN PELVIS W CONTRAST  Result Date: 11/12/2020 CLINICAL DATA:  Thoracic malignancy. Evaluate for metastatic disease. EXAM: CT ABDOMEN AND PELVIS WITH CONTRAST TECHNIQUE: Multidetector CT imaging of the abdomen and pelvis was performed using the standard protocol following bolus administration of intravenous contrast. CONTRAST:  66mL OMNIPAQUE IOHEXOL 300 MG/ML  SOLN COMPARISON:  03/18/2011 FINDINGS: Lower chest: Please see CTA Chest performed earlier today. Hepatobiliary: Liver measures 25.5 cm craniocaudal length, enlarged. No suspicious focal abnormality within the liver parenchyma. There is no evidence for gallstones, gallbladder wall thickening, or pericholecystic fluid. No intrahepatic or extrahepatic biliary dilation. Pancreas: No focal mass lesion. No dilatation of the main duct. No intraparenchymal cyst. No peripancreatic edema. Spleen: No splenomegaly. No focal mass lesion. Adrenals/Urinary Tract: No adrenal nodule or mass. Kidneys unremarkable. No evidence for hydroureter. The urinary bladder appears normal for the degree of distention. Stomach/Bowel: Stomach is unremarkable. No gastric wall  thickening. No evidence of outlet obstruction. Duodenum is normally positioned as is the ligament of Treitz. No small bowel wall thickening. No small bowel dilatation. The terminal ileum is normal. The appendix is normal. No gross colonic mass. No colonic wall thickening. Vascular/Lymphatic: There is abdominal aortic atherosclerosis without aneurysm. There is no gastrohepatic or hepatoduodenal ligament lymphadenopathy. No retroperitoneal or mesenteric lymphadenopathy. No pelvic sidewall lymphadenopathy. Reproductive: The uterus is unremarkable.  There is no adnexal mass. Other: No intraperitoneal free fluid. Musculoskeletal: No worrisome lytic or sclerotic osseous abnormality. Advanced degenerative changes are noted at the L1-2 interspace. IMPRESSION: 1. No evidence for metastatic disease in the abdomen or pelvis. 2. Hepatomegaly. 3. Aortic Atherosclerosis (ICD10-I70.0). Electronically Signed   By: Misty Stanley M.D.   On: 11/12/2020 13:24   DG CHEST PORT 1 VIEW  Result Date: 11/20/2020 CLINICAL DATA:  Pneumonia EXAM: PORTABLE CHEST 1 VIEW COMPARISON:  November 18, 2020 FINDINGS: Airspace opacity is again noted in portions of the left mid lung and left base as well as in the right lower lung region. Appearance similar to 1 day prior. No new opacity evident. Heart is mildly enlarged, stable. Pulmonary vascularity normal. No evident adenopathy. Postoperative change noted in lower cervical spine. IMPRESSION: Patchy airspace disease bilaterally, similar to 2 days prior. No evident new opacity. Stable cardiac prominence. Electronically Signed   By: Lowella Grip III M.D.   On: 11/20/2020 07:57   DG Chest Port 1 View  Result Date: 11/18/2020 CLINICAL DATA:  Respiratory failure EXAM: PORTABLE CHEST 1 VIEW COMPARISON:  November 16, 2020 FINDINGS: There is airspace opacity in the left mid lung and both lower lung regions. No consolidation. Heart is mildly enlarged, stable. The pulmonary vascularity is normal. No  adenopathy no bone lesions. IMPRESSION: Airspace opacity in the left mid lung and both lower lung regions. Question atypical organism pneumonia. Check of COVID-19 status advised in this regard. Stable cardiac prominence. Electronically Signed   By: Lowella Grip III M.D.   On: 11/18/2020 07:58   DG CHEST PORT 1 VIEW  Result Date: 11/16/2020 CLINICAL DATA:  Status post right thoracentesis EXAM: PORTABLE CHEST 1 VIEW COMPARISON:  11/14/2020 FINDINGS: Cardiac shadow is enlarged but stable. Diffuse airspace opacities are identified throughout both lungs  but mildly improved when compared with the prior exam. Interval decrease in right-sided pleural effusion is noted following thoracentesis. No pneumothorax is seen. No acute bony abnormality is noted. IMPRESSION: Slight improved aeration bilaterally when compared with the prior study. Reduction in right effusion following thoracentesis without evidence of pneumothorax. Electronically Signed   By: Inez Catalina M.D.   On: 11/16/2020 11:12   DG Chest Port 1 View  Result Date: 11/14/2020 CLINICAL DATA:  Acute respiratory failure EXAM: PORTABLE CHEST 1 VIEW COMPARISON:  Radiograph 11/13/2020, chest CT 11/12/2020 FINDINGS: Low lung volumes. Diffuse patchy opacities throughout both lungs are similar in extent and distribution to the comparison exam accounting for differences in technique. Opacities including more masslike region in the right mid lung. Additional veiling opacity in the right lung base compatible with no right effusion. Partial obscuration of the cardiomediastinal contours. Visible portions are stable. No acute osseous or soft tissue abnormality. Telemetry leads overlie the chest. IMPRESSION: Mixed interstitial airspace opacities throughout both lungs, right greater than left with more masslike opacity in the right mid lung. Right pleural effusion. Overall appearance is not significantly changed from most recent comparison. Electronically Signed   By:  Lovena Le M.D.   On: 11/14/2020 02:56   DG Chest Port 1 View  Result Date: 11/13/2020 CLINICAL DATA:  Pneumonia. EXAM: PORTABLE CHEST 1 VIEW COMPARISON:  CT of the chest from November 12, 2020. FINDINGS: Lung volumes remain low. Trachea midline. Cardiomediastinal contours and hilar structures grossly stable though largely obscured by diffuse interstitial and airspace disease, worse in the RIGHT mid chest. Suggestion of worsening in general of interstitial and alveolar opacities. Signs of RIGHT effusion. On limited assessment no acute skeletal process. IMPRESSION: 1. Worsening of interstitial and airspace opacities with more profound density corresponding to masslike area on recent CT chest in the RIGHT mid chest as on the previous study. 2. Findings again raise the question of RIGHT middle lobe neoplasm with superimposed infection. 3. Signs of RIGHT effusion. Electronically Signed   By: Zetta Bills M.D.   On: 11/13/2020 07:36   DG Chest Portable 1 View  Result Date: 11/12/2020 CLINICAL DATA:  Shortness of breath.  Chest pain.  Hemoptysis EXAM: PORTABLE CHEST 1 VIEW COMPARISON:  Chest x-ray 07/17/2019 FINDINGS: The heart size and mediastinal contours are unchanged. Interval development of diffuse interstitial and airspace opacity. No pulmonary edema. Trace right pleural effusion. No definite left pleural effusion. No pneumothorax. No acute osseous abnormality. IMPRESSION: 1. Interval development of diffuse interstitial and airspace opacity. Finding could represent infection or inflammation with alveolar hemorrhage not excluded. 2. Trace right pleural effusion. Electronically Signed   By: Iven Finn M.D.   On: 11/12/2020 03:30   ECHOCARDIOGRAM COMPLETE  Result Date: 11/18/2020    ECHOCARDIOGRAM REPORT   Patient Name:   SHAHIRA FISKE Date of Exam: 11/18/2020 Medical Rec #:  [id number]        Height:       64.5 in Accession #:    [id number]       Weight:       185.4 lb Date of Birth:  01-30-1971          BSA:          1.905 m Patient Age:    50 years         BP:           112/60 mmHg Patient Gender: F  HR:           78 bpm. Exam Location:  Inpatient Procedure: 2D Echo, Cardiac Doppler and Color Doppler Indications:    Elevated Brain Natriuretic Peptide BNP level [id number]  History:        Patient has no prior history of Echocardiogram examinations.                 Risk Factors:Hypertension. Arrythmia.  Sonographer:    Tiffany Dance Referring Phys: [id number] Charlesetta Ivory GONFA IMPRESSIONS  1. The aortic valve is tricuspid. Aortic valve regurgitation is moderate. No aortic stenosis is present. Aortic regurgitation PHT measures 444 msec.  2. Left ventricular ejection fraction, by estimation, is 60 to 65%. The left ventricle has normal function. The left ventricle has no regional wall motion abnormalities. Left ventricular diastolic parameters are consistent with Grade I diastolic dysfunction (impaired relaxation).  3. Right ventricular systolic function is normal. The right ventricular size is normal. There is normal pulmonary artery systolic pressure. The estimated right ventricular systolic pressure is 16.3 mmHg.  4. The mitral valve is grossly normal. Trivial mitral valve regurgitation. No evidence of mitral stenosis.  5. The inferior vena cava is normal in size with greater than 50% respiratory variability, suggesting right atrial pressure of 3 mmHg. Comparison(s): No prior Echocardiogram. FINDINGS  Left Ventricle: Left ventricular ejection fraction, by estimation, is 60 to 65%. The left ventricle has normal function. The left ventricle has no regional wall motion abnormalities. The left ventricular internal cavity size was normal in size. There is  no left ventricular hypertrophy. Left ventricular diastolic parameters are consistent with Grade I diastolic dysfunction (impaired relaxation). Right Ventricle: The right ventricular size is normal. No increase in right ventricular wall thickness. Right ventricular  systolic function is normal. There is normal pulmonary artery systolic pressure. The tricuspid regurgitant velocity is 2.44 m/s, and  with an assumed right atrial pressure of 3 mmHg, the estimated right ventricular systolic pressure is 84.6 mmHg. Left Atrium: Left atrial size was normal in size. Right Atrium: Right atrial size was normal in size. Pericardium: There is no evidence of pericardial effusion. Mitral Valve: The mitral valve is grossly normal. Trivial mitral valve regurgitation. No evidence of mitral valve stenosis. Tricuspid Valve: The tricuspid valve is grossly normal. Tricuspid valve regurgitation is trivial. No evidence of tricuspid stenosis. Aortic Valve: The aortic valve is tricuspid. Aortic valve regurgitation is moderate. Aortic regurgitation PHT measures 444 msec. No aortic stenosis is present. Pulmonic Valve: The pulmonic valve was not well visualized. Pulmonic valve regurgitation is not visualized. No evidence of pulmonic stenosis. Aorta: The aortic root and ascending aorta are structurally normal, with no evidence of dilitation. Venous: The inferior vena cava is normal in size with greater than 50% respiratory variability, suggesting right atrial pressure of 3 mmHg. IAS/Shunts: The atrial septum is grossly normal.  LEFT VENTRICLE PLAX 2D LVIDd:         5.10 cm Diastology LVIDs:         2.90 cm LV e' medial:    4.46 cm/s LV PW:         1.10 cm LV E/e' medial:  13.0 LV IVS:        0.90 cm LV e' lateral:   8.16 cm/s                        LV E/e' lateral: 7.1  RIGHT VENTRICLE  IVC RV Basal diam:  3.90 cm     IVC diam: 1.80 cm RV Mid diam:    2.80 cm RV S prime:     16.10 cm/s TAPSE (M-mode): 2.4 cm LEFT ATRIUM             Index       RIGHT ATRIUM           Index LA diam:        4.90 cm 2.57 cm/m  RA Area:     17.10 cm LA Vol (A2C):   54.8 ml 28.77 ml/m RA Volume:   48.50 ml  25.47 ml/m LA Vol (A4C):   63.6 ml 33.39 ml/m LA Biplane Vol: 61.2 ml 32.13 ml/m  AORTIC VALVE LVOT Vmax:    133.50 cm/s LVOT Vmean:  86.600 cm/s LVOT VTI:    0.249 m AI PHT:      444 msec  AORTA Ao Asc diam: 3.70 cm MITRAL VALVE               TRICUSPID VALVE MV Area (PHT): 3.27 cm    TR Peak grad:   23.8 mmHg MV Decel Time: 232 msec    TR Vmax:        244.00 cm/s MV E velocity: 58.20 cm/s MV A velocity: 85.90 cm/s  SHUNTS MV E/A ratio:  0.68        Systemic VTI: 0.25 m Rudean Haskell MD Electronically signed by Rudean Haskell MD Signature Date/Time: 11/18/2020/4:06:16 PM    Final     Microbiology: Recent Results (from the past 240 hour(s))  Culture, blood (routine x 2)     Status: None   Collection Time: 11/12/20  2:11 AM   Specimen: BLOOD  Result Value Ref Range Status   Specimen Description BLOOD RIGHT ANTECUBITAL  Final   Special Requests   Final    BOTTLES DRAWN AEROBIC AND ANAEROBIC Blood Culture adequate volume   Culture   Final    NO GROWTH 5 DAYS Performed at [facility], [address], [place], [place] [address]    Report Status 11/17/2020 FINAL  Final  Culture, blood (routine x 2)     Status: None   Collection Time: 11/12/20  2:15 AM   Specimen: BLOOD LEFT HAND  Result Value Ref Range Status   Specimen Description BLOOD LEFT HAND  Final   Special Requests   Final    BOTTLES DRAWN AEROBIC AND ANAEROBIC Blood Culture results may not be optimal due to an inadequate volume of blood received in culture bottles   Culture   Final    NO GROWTH 5 DAYS Performed at [facility], [place] [address], [place], [place] [address]    Report Status 11/17/2020 FINAL  Final  Resp Panel by RT-PCR (Flu A&B, Covid) Nasopharyngeal Swab     Status: None   Collection Time: 11/12/20  2:34 AM   Specimen: Nasopharyngeal Swab; Nasopharyngeal(NP) swabs in vial transport medium  Result Value Ref Range Status   SARS Coronavirus 2 by RT PCR NEGATIVE NEGATIVE Final    Comment: (NOTE) SARS-CoV-2 target nucleic acids are NOT DETECTED.  The SARS-CoV-2 RNA is generally detectable in upper  respiratory specimens during the acute phase of infection. The lowest concentration of SARS-CoV-2 viral copies this assay can detect is 138 copies/mL. A negative result does not preclude SARS-Cov-2 infection and should not be used as the sole basis for treatment or other patient management decisions. A negative result may occur  with  improper specimen collection/handling, submission of specimen other than nasopharyngeal swab, presence of viral mutation(s) within the areas targeted by this assay, and inadequate number of viral copies(<138 copies/mL). A negative result must be combined with clinical observations, patient history, and epidemiological information. The expected result is Negative.  Fact Sheet for Patients:  EntrepreneurPulse.com.au  Fact Sheet for Healthcare Providers:  IncredibleEmployment.be  This test is no t yet approved or cleared by the [place] FDA and  has been authorized for detection and/or diagnosis of SARS-CoV-2 by FDA under an Emergency Use Authorization (EUA). This EUA will remain  in effect (meaning this test can be used) for the duration of the COVID-19 declaration under Section 564(b)(1) of the Act, 21 U.S.C.section 360bbb-3(b)(1), unless the authorization is terminated  or revoked sooner.       Influenza A by PCR NEGATIVE NEGATIVE Final   Influenza B by PCR NEGATIVE NEGATIVE Final    Comment: (NOTE) The Xpert Xpress SARS-CoV-2/FLU/RSV plus assay is intended as an aid in the diagnosis of influenza from Nasopharyngeal swab specimens and should not be used as a sole basis for treatment. Nasal washings and aspirates are unacceptable for Xpert Xpress SARS-CoV-2/FLU/RSV testing.  Fact Sheet for Patients: EntrepreneurPulse.com.au  Fact Sheet for Healthcare Providers: IncredibleEmployment.be  This test is not yet approved or cleared by the [place] FDA and has been  authorized for detection and/or diagnosis of SARS-CoV-2 by FDA under an Emergency Use Authorization (EUA). This EUA will remain in effect (meaning this test can be used) for the duration of the COVID-19 declaration under Section 564(b)(1) of the Act, 21 U.S.C. section 360bbb-3(b)(1), unless the authorization is terminated or revoked.  Performed at [facility], [place] [address], [place], [place] [address]   MRSA PCR Screening     Status: None   Collection Time: 11/12/20  6:36 AM   Specimen: Nasopharyngeal  Result Value Ref Range Status   MRSA by PCR NEGATIVE NEGATIVE Final    Comment:        The GeneXpert MRSA Assay (FDA approved for NASAL specimens only), is one component of a comprehensive MRSA colonization surveillance program. It is not intended to diagnose MRSA infection nor to guide or monitor treatment for MRSA infections. Performed at [facility], [place] [address], [place], [place] [address]   Urine culture     Status: None   Collection Time: 11/13/20  4:10 PM   Specimen: Urine, Random  Result Value Ref Range Status   Specimen Description URINE, RANDOM  Final   Special Requests NONE  Final   Culture   Final    NO GROWTH Performed at [facility], [place] [address], [place], [place] [address]    Report Status 11/15/2020 FINAL  Final  Expectorated Sputum Assessment w Gram Stain, Rflx to Resp Cult     Status: None   Collection Time: 11/16/20  6:24 AM   Specimen: Expectorated Sputum  Result Value Ref Range Status   Specimen Description EXPECTORATED SPUTUM  Final   Special Requests NONE  Final   Sputum evaluation   Final    Sputum specimen not acceptable for testing.  Please recollect.   RESULT CALLED TO, READ BACK BY AND VERIFIED WITH: RN A.CRUSE AT 0916 ON 11/18/2020 BY T.SAAD. Performed at [facility], [place] [address], [place], [place] [address]    Report Status 11/18/2020 FINAL  Final  Respiratory (~20 pathogens) panel by PCR     Status: None  Collection Time: 11/17/20  3:44 PM   Specimen: Nasopharyngeal Swab; Respiratory  Result Value Ref Range Status   Adenovirus NOT DETECTED NOT DETECTED Final   Coronavirus 229E NOT DETECTED NOT DETECTED Final    Comment: (NOTE) The Coronavirus on the Respiratory Panel, DOES NOT test for the novel  Coronavirus (2019 nCoV)    Coronavirus HKU1 NOT DETECTED NOT DETECTED Final   Coronavirus NL63 NOT DETECTED NOT DETECTED Final   Coronavirus OC43 NOT DETECTED NOT DETECTED Final   Metapneumovirus NOT DETECTED NOT DETECTED Final   Rhinovirus / Enterovirus NOT DETECTED NOT DETECTED Final   Influenza A NOT DETECTED NOT DETECTED Final   Influenza B NOT DETECTED NOT DETECTED Final   Parainfluenza Virus 1 NOT DETECTED NOT DETECTED Final   Parainfluenza Virus 2 NOT DETECTED NOT DETECTED Final   Parainfluenza Virus 3 NOT DETECTED NOT DETECTED Final   Parainfluenza Virus 4 NOT DETECTED NOT DETECTED Final   Respiratory Syncytial Virus NOT DETECTED NOT DETECTED Final   Bordetella pertussis NOT DETECTED NOT DETECTED Final   Bordetella Parapertussis NOT DETECTED NOT DETECTED Final   Chlamydophila pneumoniae NOT DETECTED NOT DETECTED Final   Mycoplasma pneumoniae NOT DETECTED NOT DETECTED Final    Comment: Performed at [facility], [place]. [address] [place], [place] [address]     Labs: Basic Metabolic Panel: Recent Labs  Lab 11/16/20 0023 11/18/20 0146 11/19/20 0049 11/20/20 0120 11/21/20 0111  NA 135 135 136 138 136  K 4.1 3.3* 4.1 3.9 3.9  CL 93* 90* 94* 94* 96*  CO2 34* 32 35* 35* 32  GLUCOSE 159* 106* 116* 112* 99  BUN 17 25* 21* 13 9  CREATININE 0.54 0.65 0.70 0.64 0.59  CALCIUM 8.7* 9.4 9.1 9.5 9.3  MG  --  2.3 2.3 2.5* 2.2  PHOS  --   --   --  3.9 4.0   Liver Function Tests: Recent Labs  Lab 11/20/20 0120 11/21/20 0111  AST 20 23  ALT 51* 43  ALKPHOS 119 103  BILITOT 0.5 0.4  PROT 7.0 6.5  ALBUMIN 2.7* 2.7*   No results for input(s): LIPASE, AMYLASE in the last 168  hours. No results for input(s): AMMONIA in the last 168 hours. CBC: Recent Labs  Lab 11/16/20 0023 11/18/20 0146 11/19/20 0049 11/20/20 0120 11/21/20 0111  WBC 16.9* 14.8* 15.0* 15.5* 13.8*  NEUTROABS  --   --   --  12.6* 9.1*  HGB 8.8* 9.8* 10.2* 10.9* 11.0*  HCT 29.5* 34.4* 35.9* 38.1 38.3  MCV 70.1* 71.2* 72.7* 73.6* 74.2*  PLT 499* 551* 548* 527* 490*   Cardiac Enzymes: No results for input(s): CKTOTAL, CKMB, CKMBINDEX, TROPONINI in the last 168 hours. BNP: BNP (last 3 results) Recent Labs    11/12/20 0232 11/18/20 0146 11/20/20 0120  BNP 85.6 462.1* 60.3    ProBNP (last 3 results) No results for input(s): PROBNP in the last 8760 hours.  CBG: Recent Labs  Lab 11/20/20 1154 11/20/20 1645 11/20/20 1956 11/21/20 0742 11/21/20 1206  GLUCAP 171* 219* 227* 114* 170*       Signed:  Dia Crawford, MD [facility]

## 2020-11-21 NOTE — Plan of Care (Signed)
  Problem: Education: Goal: Knowledge of General Education information will improve Description: Including pain rating scale, medication(s)/side effects and non-pharmacologic comfort measures 11/21/2020 1303 by Richarda Overlie, RN Outcome: Adequate for Discharge 11/21/2020 1302 by Richarda Overlie, RN Outcome: Adequate for Discharge   Problem: Health Behavior/Discharge Planning: Goal: Ability to manage health-related needs will improve 11/21/2020 1303 by Richarda Overlie, RN Outcome: Adequate for Discharge 11/21/2020 1302 by Richarda Overlie, RN Outcome: Adequate for Discharge   Problem: Clinical Measurements: Goal: Ability to maintain clinical measurements within normal limits will improve 11/21/2020 1303 by Richarda Overlie, RN Outcome: Adequate for Discharge 11/21/2020 1302 by Richarda Overlie, RN Outcome: Adequate for Discharge Goal: Will remain free from infection 11/21/2020 1303 by Richarda Overlie, RN Outcome: Adequate for Discharge 11/21/2020 1302 by Richarda Overlie, RN Outcome: Adequate for Discharge Goal: Diagnostic test results will improve 11/21/2020 1303 by Richarda Overlie, RN Outcome: Adequate for Discharge 11/21/2020 1302 by Richarda Overlie, RN Outcome: Adequate for Discharge Goal: Respiratory complications will improve 11/21/2020 1303 by Richarda Overlie, RN Outcome: Adequate for Discharge 11/21/2020 1302 by Richarda Overlie, RN Outcome: Adequate for Discharge Goal: Cardiovascular complication will be avoided 11/21/2020 1303 by Richarda Overlie, RN Outcome: Adequate for Discharge 11/21/2020 1302 by Richarda Overlie, RN Outcome: Adequate for Discharge   Problem: Activity: Goal: Risk for activity intolerance will decrease 11/21/2020 1303 by Richarda Overlie, RN Outcome: Adequate for Discharge 11/21/2020 1302 by Richarda Overlie, RN Outcome: Adequate for Discharge   Problem: Nutrition: Goal: Adequate nutrition will be maintained 11/21/2020 1303 by Richarda Overlie,  RN Outcome: Adequate for Discharge 11/21/2020 1302 by Richarda Overlie, RN Outcome: Adequate for Discharge   Problem: Coping: Goal: Level of anxiety will decrease 11/21/2020 1303 by Richarda Overlie, RN Outcome: Adequate for Discharge 11/21/2020 1302 by Richarda Overlie, RN Outcome: Adequate for Discharge   Problem: Elimination: Goal: Will not experience complications related to bowel motility 11/21/2020 1303 by Richarda Overlie, RN Outcome: Adequate for Discharge 11/21/2020 1302 by Richarda Overlie, RN Outcome: Adequate for Discharge Goal: Will not experience complications related to urinary retention 11/21/2020 1303 by Richarda Overlie, RN Outcome: Adequate for Discharge 11/21/2020 1302 by Richarda Overlie, RN Outcome: Adequate for Discharge   Problem: Pain Managment: Goal: General experience of comfort will improve 11/21/2020 1303 by Richarda Overlie, RN Outcome: Adequate for Discharge 11/21/2020 1302 by Richarda Overlie, RN Outcome: Adequate for Discharge   Problem: Safety: Goal: Ability to remain free from injury will improve 11/21/2020 1303 by Richarda Overlie, RN Outcome: Adequate for Discharge 11/21/2020 1302 by Richarda Overlie, RN Outcome: Adequate for Discharge   Problem: Skin Integrity: Goal: Risk for impaired skin integrity will decrease 11/21/2020 1303 by Richarda Overlie, RN Outcome: Adequate for Discharge 11/21/2020 1302 by Richarda Overlie, RN Outcome: Adequate for Discharge

## 2020-11-25 ENCOUNTER — Other Ambulatory Visit: Payer: Self-pay

## 2020-11-25 ENCOUNTER — Ambulatory Visit: Payer: Self-pay | Attending: Physician Assistant | Admitting: Physician Assistant

## 2020-11-25 ENCOUNTER — Encounter: Payer: Self-pay | Admitting: Physician Assistant

## 2020-11-25 DIAGNOSIS — R918 Other nonspecific abnormal finding of lung field: Secondary | ICD-10-CM

## 2020-11-25 DIAGNOSIS — Z09 Encounter for follow-up examination after completed treatment for conditions other than malignant neoplasm: Secondary | ICD-10-CM

## 2020-11-25 DIAGNOSIS — F419 Anxiety disorder, unspecified: Secondary | ICD-10-CM

## 2020-11-25 DIAGNOSIS — F3181 Bipolar II disorder: Secondary | ICD-10-CM

## 2020-11-25 DIAGNOSIS — J189 Pneumonia, unspecified organism: Secondary | ICD-10-CM

## 2020-11-25 DIAGNOSIS — J9611 Chronic respiratory failure with hypoxia: Secondary | ICD-10-CM

## 2020-11-25 MED ORDER — HYDROXYZINE HCL 25 MG PO TABS
25.0000 mg | ORAL_TABLET | Freq: Three times a day (TID) | ORAL | 1 refills | Status: DC | PRN
  Filled 2020-11-25: qty 90, 30d supply, fill #0
  Filled 2020-12-08: qty 90, 30d supply, fill #1

## 2020-11-25 NOTE — Progress Notes (Signed)
Patient ID: Adriana Hall, female   DOB: Mar 08, 1971, 50 y.o.   MRN: [id number]    Virtual Visit via Telephone Note  I connected with Adriana Hall on 11/25/20 at  3:50 PM EDT by telephone and verified that I am speaking with the correct person using two identifiers.  Location: Patient: home Provider: [facility] office   I discussed the limitations, risks, security and privacy concerns of performing an evaluation and management service by telephone and the availability of in person appointments. I also discussed with the patient that there may be a patient responsible charge related to this service. The patient expressed understanding and agreed to proceed.   History of Present Illness:  after hospitalization 6/9-6/18/2022. patient just discharged about 4 days ago.  She was found to have lung mass and CAP.  She has follow up with pulmonology scheduled.  She has her home O2 and someone is coming out to check it.  She needs a RF of hydroxyzine.  She has her other meds.  Weights stable.  Appetite improving.     from discharge summary:  Discharge Diagnoses:  Active Problems:   HYPERTRIGLYCERIDEMIA   HYPERTENSION, BENIGN   Coronary atherosclerosis   SVT/ PSVT/ PAT   GERD (gastroesophageal reflux disease)   GAD (generalized anxiety disorder)   Alcohol use disorder, severe, dependence (HCC)   Bipolar 2 disorder (HCC)   Anxiety   Acute respiratory failure with hypoxia (HCC)   CAP (community acquired pneumonia)   Chronic respiratory failure with hypoxia (HCC)   Sepsis ([place])   Hypokalemia   Mass of right lung   Chronic diastolic CHF (congestive heart failure) (HCC)   Aortic valve regurgitation   Tobacco abuse   Alcohol abuse   After hospitalization 6/9-6/18/2022  From discharge summary: Admit date: 11/12/2020 Discharge date: 11/21/2020   Time spent: 35 minutes   Recommendations for Outpatient Follow-up:    Acute hypoxic respiratory failure secondary to bilateral multifocal  pneumonia/airspace disease:  -Also concern for possible ILD, inflammatory autoimmune process.  Currently on 20 L HFNC/50% FiO2 to maintain saturation in low 90s.  CXR with improved right-sided opacity and persistent left lateral opacity. -Complete 7-day course of Zosyn -Transitioned from Solu-Medrol to p.o. prednisone taper -6/13 s/p RIGHT thoracentesis-10 cc cloudy debris drained.  Cytology negative.  No suction samples for micro. -Bronchodilators, I-S, flutter valve -Pulmonary following -Follow-up autoimmune labs and limited TTE -6/17 PCXR: Stable to slightly improved pulmonary infiltrates see results below -6/17SATURATION QUALIFICATIONS: (This note is used to comply with regulatory documentation for home oxygen) Patient Saturations on Room Air at Rest = 94% Patient Saturations on Room Air while Ambulating = 86% Patient Saturations on 2 Liters of oxygen while Ambulating = 92% Please briefly explain why patient needs home oxygen: Needs O2 for walking/doing activities -Patient meets criteria for home O2 - 2 L O2 titrate to maintain SPO2> 92% -Provide Inogen double home O2 concentrator -Follow-up at [facility].  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  Chronic diastolic CHF/moderate AV Regurgitation - Strict in and out - Daily weight   Elevated BNP:  -Likely from fluid resuscitation.  Appears euvolemic. -Echocardiogram consistent with chronic diastolic CHF see results below   NSVT: -The night of 6/11.  Likely due to #1.  Bipolar disorder with chronic opioid use -Continue Zyprexa and duloxetine.  Continue home Suboxone -Gabapentin    Iron deficiency anemia: H&H relatively stable. Recent  Labs       Lab Results  Component Value Date    HGB 11.0 (L) 11/21/2020    HGB 10.9 (L) 11/20/2020    HGB 10.2 (L) 11/19/2020    HGB 9.8 (L) 11/18/2020    HGB 8.8 (L) 11/16/2020    -Continue iron supplements and bowel regimen -Monitor H&H intermittently.     Tobacco abuse and alcohol abuse: No withdrawal symptoms. -Counseled on alcohol and tobacco cessation's. -Nicotine patch as needed.     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 -Discussed with PCP nutrition/weight loss program   Observations/Objective: NAD.  A&Ox3  Assessment and Plan: 1. Anxiety continue - hydrOXYzine (ATARAX/VISTARIL) 25 MG tablet; Take 1 tablet (25 mg total) by mouth 3 (three) times daily as needed for anxiety.  Dispense: 90 tablet; Refill: 1  2. Bipolar 2 disorder ([place]) Continue zyprexa  3. Mass of right lung See pulm in f/up 12/24/2020 - Comprehensive metabolic panel; Future  4. Chronic respiratory failure with hypoxia (HCC) Continue home O2 - Comprehensive metabolic panel; Future  5. Community acquired pneumonia, unspecified laterality Resolved.  No fevers.  Much improved - Comprehensive metabolic panel; Future - CBC with Differential/Platelet; Future   Follow Up Instructions: Assign PCP in 6 weeks    I discussed the assessment and treatment plan with the patient. The patient was provided an opportunity to ask questions and all were answered. The patient agreed with the plan and demonstrated an understanding of the instructions.   The patient was advised to call back or seek an in-person evaluation if the symptoms worsen or if the condition fails to improve as anticipated.  I provided 17 minutes of non-face-to-face time during this encounter.   Freeman Caldron, PA-C

## 2020-11-26 ENCOUNTER — Telehealth: Payer: Self-pay

## 2020-11-26 NOTE — Telephone Encounter (Signed)
Pt only saw angela nad has not est care with a PCP yet, Has upcoming appointment with Zelda next month.

## 2020-11-26 NOTE — Telephone Encounter (Signed)
Copied from [place] [id number]. Topic: General - Other >> Nov 26, 2020 11:49 AM Yvette Rack wrote: Reason for CRM: Pt stated she has a yeast infection from the antibiotics that she was given while in the hospital. Pt stated she was given a medication for the infection in her mouth but now it has developed in her private area as well and she would like a Rx to be sent to [facility] [place] Phone: [phone]   Fax: [phone]   Patient had appt with [facility] on 6/22

## 2020-11-27 ENCOUNTER — Other Ambulatory Visit: Payer: Self-pay

## 2020-11-27 MED ORDER — FLUCONAZOLE 150 MG PO TABS
150.0000 mg | ORAL_TABLET | Freq: Once | ORAL | 0 refills | Status: AC
  Filled 2020-11-27: qty 1, 1d supply, fill #0

## 2020-11-27 NOTE — Telephone Encounter (Signed)
Done

## 2020-11-30 ENCOUNTER — Other Ambulatory Visit: Payer: Self-pay

## 2020-11-30 ENCOUNTER — Inpatient Hospital Stay: Payer: Self-pay | Admitting: Emergency Medicine

## 2020-11-30 NOTE — Telephone Encounter (Signed)
Pt was called and informed of medication being refilled.

## 2020-12-08 ENCOUNTER — Other Ambulatory Visit: Payer: Self-pay

## 2020-12-11 ENCOUNTER — Other Ambulatory Visit: Payer: Self-pay

## 2020-12-11 ENCOUNTER — Telehealth (INDEPENDENT_AMBULATORY_CARE_PROVIDER_SITE_OTHER): Payer: No Payment, Other | Admitting: Physician Assistant

## 2020-12-11 DIAGNOSIS — F419 Anxiety disorder, unspecified: Secondary | ICD-10-CM | POA: Diagnosis not present

## 2020-12-11 DIAGNOSIS — F3181 Bipolar II disorder: Secondary | ICD-10-CM | POA: Diagnosis not present

## 2020-12-11 MED ORDER — OLANZAPINE 5 MG PO TABS
5.0000 mg | ORAL_TABLET | Freq: Every day | ORAL | 1 refills | Status: DC
  Filled 2020-12-11: qty 30, 30d supply, fill #0
  Filled 2021-02-02: qty 30, 30d supply, fill #1

## 2020-12-11 MED ORDER — DULOXETINE HCL 60 MG PO CPEP
60.0000 mg | ORAL_CAPSULE | Freq: Two times a day (BID) | ORAL | 1 refills | Status: DC
  Filled 2020-12-11: qty 60, 30d supply, fill #0
  Filled 2021-02-02: qty 60, 30d supply, fill #1
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
## 2020-12-14 ENCOUNTER — Other Ambulatory Visit: Payer: Self-pay

## 2020-12-24 ENCOUNTER — Inpatient Hospital Stay: Payer: Self-pay | Admitting: Physician Assistant

## 2020-12-24 ENCOUNTER — Inpatient Hospital Stay: Payer: Self-pay | Admitting: Emergency Medicine

## 2020-12-24 ENCOUNTER — Telehealth: Payer: Self-pay | Admitting: Emergency Medicine

## 2020-12-24 NOTE — Telephone Encounter (Signed)
ATC left voicemail. Pt needs to be seen by Dr. Lamonte Sakai or  with Judson Roch. Will attempt to contact later.

## 2021-01-04 ENCOUNTER — Telehealth: Payer: Self-pay | Admitting: Emergency Medicine

## 2021-01-04 ENCOUNTER — Other Ambulatory Visit: Payer: Self-pay | Admitting: Acute Care

## 2021-01-04 MED ORDER — IPRATROPIUM-ALBUTEROL 20-100 MCG/ACT IN AERS: 1.0000 | INHALATION_SPRAY | Freq: Four times a day (QID) | RESPIRATORY_TRACT | 0 refills | Status: DC

## 2021-01-04 NOTE — Telephone Encounter (Signed)
I called and spoke with patient spouse who is on DPR and requesting refill on Combivent. Patient has an appt to see SG on 01/13/21 so I sent in one inhaler with no refills and said once patient is seen in office, we can send more. Spouse asked if we knew how much it would cost and I informed him that it does not tell us that but can ask the pharmacy and can also ask if something like combivent is cheaper and can ask the provider to send it in. Spouse verbalized understanding., nothing further needed.

## 2021-01-05 ENCOUNTER — Telehealth: Payer: Self-pay | Admitting: Acute Care

## 2021-01-05 NOTE — Telephone Encounter (Signed)
Called and spoke with patient's husband. He stated that the Combivent inhaler cost almost $600 to fill at [facility] on [address]. They attempted to use the Goodrx card and it only brought the price down to $400. Patient in the process of applying for Medicaid but they will know anything for another 2 months. Patient has also applied for an orange card with the [facility] and [facility] but has not heard anything back in regards to the application.   I advised him that I would call the [facility] to see if we could work together to find a solution. Will leave this encounter in triage for follow up tomorrow morning.

## 2021-01-06 NOTE — Telephone Encounter (Signed)
[facility] is now [facility].  Will need to be called in the morning.

## 2021-01-07 ENCOUNTER — Other Ambulatory Visit: Payer: Self-pay

## 2021-01-07 NOTE — Telephone Encounter (Signed)
[place] and spoke with a rep in the pharmacy and was advised I need to speak with the front desk. I was transferred and then disconnected. Called back and was on long hold. WCB.

## 2021-01-12 ENCOUNTER — Encounter (HOSPITAL_COMMUNITY): Payer: Self-pay | Admitting: Physician Assistant

## 2021-01-12 NOTE — Telephone Encounter (Signed)
[place] [facility] and spoke with [facility] rep and was advised Clifton James will call back with more information about pt's status.

## 2021-01-13 ENCOUNTER — Ambulatory Visit (INDEPENDENT_AMBULATORY_CARE_PROVIDER_SITE_OTHER): Payer: Self-pay

## 2021-01-13 ENCOUNTER — Other Ambulatory Visit: Payer: Self-pay

## 2021-01-13 ENCOUNTER — Ambulatory Visit (INDEPENDENT_AMBULATORY_CARE_PROVIDER_SITE_OTHER): Payer: Self-pay | Admitting: Acute Care

## 2021-01-13 ENCOUNTER — Encounter: Payer: Self-pay | Admitting: Acute Care

## 2021-01-13 VITALS — BP 126/78 | HR 88 | Temp 97.6°F | Ht 65.0 in | Wt 174.8 lb

## 2021-01-13 DIAGNOSIS — Z720 Tobacco use: Secondary | ICD-10-CM

## 2021-01-13 DIAGNOSIS — J181 Lobar pneumonia, unspecified organism: Secondary | ICD-10-CM

## 2021-01-13 DIAGNOSIS — I5032 Chronic diastolic (congestive) heart failure: Secondary | ICD-10-CM

## 2021-01-13 DIAGNOSIS — J441 Chronic obstructive pulmonary disease with (acute) exacerbation: Secondary | ICD-10-CM

## 2021-01-13 DIAGNOSIS — J9601 Acute respiratory failure with hypoxia: Secondary | ICD-10-CM

## 2021-01-13 MED ORDER — AZITHROMYCIN 250 MG PO TABS
250.0000 mg | ORAL_TABLET | Freq: Every day | ORAL | 0 refills | Status: DC
  Filled 2021-01-13: qty 6, 6d supply, fill #0

## 2021-01-13 MED ORDER — PREDNISONE 10 MG PO TABS
ORAL_TABLET | ORAL | 0 refills | Status: DC
  Filled 2021-01-13: qty 20, 8d supply, fill #0

## 2021-01-13 NOTE — Progress Notes (Signed)
History of Present Illness Adriana Hall is a 50 y.o. female current every day smoker with a 60 pack year smoking history, seen fas an inpatient for  admission for acute respiratory failure with hypoxia, multifocal pneumonia, chronic diastolic CHF. She was seen by Dr. Erin Fulling and Dr. Lamonte Sakai as an inpatient.   Admit date: 11/12/2020 Discharge date: 11/21/2020   01/14/2021 Pt. Presents for follow up after admission for acute respiratory failure with hypoxia, multifocal pneumonia, chronic diastolic CHF.She was treated with 7 days of Zosyn, Solumedrol with transition to prednisone taper, BD, Flutter valve, and discharged home with home oxygen as she had continued desaturations. She needs a follow up CT Chest to better evaluate the ? RML Mass with adenopathy noted on her in patient CT imaging to see if the area has cleared with resolution of pneumonia. Cytology from her thoracentesis 11/16/2020  was negative. Pt. States she had initially been doing well. Especially when she was on the Combivent and prednisone . She has run out of her Combivent , and she has finished her prednisone, and she states she has shortness of breath again. Although it is not as bad. She is wearing her oxygen at 2 L [place]. She states she is coughing up light beige secretions that are very thick. She does endorse wheezing.  Cost of medications is an issue. She cannot afford the Combivent prescribed. She currently does not have an inhaler ( maintenance or rescue) She states she is working with [facility] on getting medications.   Test Results:  CXR 01/13/2021>> Pending  11/2020: CT Chest >> Diffuse patchy ground-glass airspace opacities with loculated small right pleural effusion. Question a pulmonary/pleural mass along the right middle lobe. Associated right hilar and mediastinal lymphadenopathy.  Findings concerning for malignancy with superimposed infection. Pulmonary hypertension.Asymmetric pleural thickening  involving only the R hemothorax. GGO in both lungs, somewhat sparing the bases, but extending to the pleura. Some areas of traction bronchiectasis in upper lobes, but no obvious fibrosis.   6/13 pleural fluid> no malignant cells RVP negative (nasal swab) BNP 462.1 CRP 8.0 ESR 50 ACE 41 (WNL) ANCA titers >> C ANCA  < 1:20,  P ANCA  < 1:20,  Atypical P ANCA Titer< 1:20 ANA w/ reflex negative RF 11.5 (negative) CCP 5 (negative) Sputum culture 6/13- inadequate sample CXR 6/17> Patchy airspace disease bilaterally, similar to 2 days prior. No infiltrates , normal pulm vascularity      Echo 6/15: LVEF 60-65%, G1DD, normal RV size and function. Normal RA & LA. IVC normal size and variability. Trivial MR, otherwise normal valves.    Cultures  6/9 SARS coronavirus negative 6/9 influenza A/B negative 6/14 respiratory virus panel negative  CBC Latest Ref Rng & Units 11/21/2020 11/20/2020 11/19/2020  WBC 4.0 - 10.5 K/uL 13.8(H) 15.5(H) 15.0(H)  Hemoglobin 12.0 - 15.0 g/dL 11.0(L) 10.9(L) 10.2(L)  Hematocrit 36.0 - 46.0 % 38.3 38.1 35.9(L)  Platelets 150 - 400 K/uL 490(H) 527(H) 548(H)    BMP Latest Ref Rng & Units 11/21/2020 11/20/2020 11/19/2020  Glucose 70 - 99 mg/dL 99 112(H) 116(H)  BUN 6 - 20 mg/dL 9 13 21(H)  Creatinine 0.44 - 1.00 mg/dL 0.59 0.64 0.70  Sodium 135 - 145 mmol/L 136 138 136  Potassium 3.5 - 5.1 mmol/L 3.9 3.9 4.1  Chloride 98 - 111 mmol/L 96(L) 94(L) 94(L)  CO2 22 - 32 mmol/L 32 35(H) 35(H)  Calcium 8.9 - 10.3 mg/dL 9.3 9.5 9.1    BNP  Component Value Date/Time   BNP 60.3 11/20/2020 0120    ProBNP    Component Value Date/Time   PROBNP 59.9 07/30/2012 2207    PFT No results found for: FEV1PRE, FEV1POST, FVCPRE, FVCPOST, TLC, DLCOUNC, PREFEV1FVCRT, PSTFEV1FVCRT  No results found.   Past medical hx Past Medical History:  Diagnosis Date   Anxiety    10 years   Arrhythmia    5 years   Bipolar 1 disorder, depressed ([place])    Bronchitis    Colitis  03-01-2011   Colonoscopy   Depression    10 years   Headache(784.0)    Chronic for 30 yrs   Hemorrhoids 03-01-2011   Colonoscopy    Hypertension      Social History   Tobacco Use   Smoking status: Every Day    Packs/day: 2.00    Years: 30.00    Pack years: 60.00    Types: Cigarettes   Smokeless tobacco: Never   Tobacco comments:    As of 11/16/20: reports she is using patches this hospitalization  and does not want to resume cigarette use - report 2 ppd for 30+ years    information given on 03-01-11 and 09/10/13, has smoked 1.5 ppd for 20 years      Vaping Use   Vaping Use: Former   Devices: tried vaping once  Substance Use Topics   Alcohol use: Yes    Alcohol/week: 4.0 standard drinks    Types: 4 Cans of beer per week   Drug use: Yes    Types: Benzodiazepines, Other-see comments, Marijuana    Comment: oxycontin - no longer using - went to rehab    [name] reports that she has been smoking cigarettes. She has a 60.00 pack-year smoking history. She has never used smokeless tobacco. She reports current alcohol use of about 4.0 standard drinks per week. She reports current drug use. Drugs: Benzodiazepines, Other-see comments, and Marijuana.  Tobacco Cessation: Current Every Day Smoker I have spent 4 minutes counseling patient on smoking cessation this visit. We have reviewed the risks of continued smoking on his current health situation. Patient verbalizes understanding of their  choice to continue smoking and the negative health consequences including worsening of COPD, risk of lung cancer , stroke and heart disease. I have provided her with the Be Stronger than your excuses care with the number to call for free nicotine replacement therapy. She states she already has patches and is planning of starting to use them this weekend. We discussed that she cannot smoke , or even light a cigarette while she is wearing oxygen. She verbalized understanding.      Past surgical hx, Family  hx, Social hx all reviewed.  Current Outpatient Medications on File Prior to Visit  Medication Sig   acetaminophen (TYLENOL) 325 MG tablet Take 2 tablets (650 mg total) by mouth every 6 (six) hours as needed for mild pain (or Fever >/= 101).   benzonatate (TESSALON) 100 MG capsule Take 1 capsule (100 mg total) by mouth 3 (three) times daily as needed for cough.   buprenorphine-naloxone (SUBOXONE) 8-2 mg SUBL SL tablet Place 1 tablet under the tongue in the morning and at bedtime.   diphenhydrAMINE (BENADRYL) 25 MG tablet Take 50 mg by mouth at bedtime.   DULoxetine (CYMBALTA) 60 MG capsule Take 1 capsule (60 mg total) by mouth 2 (two) times daily.   ferrous sulfate 325 (65 FE) MG tablet Take 1 tablet (325 mg total) by mouth daily with breakfast.  folic acid (FOLVITE) 1 MG tablet Take 1 tablet (1 mg total) by mouth daily.   gabapentin (NEURONTIN) 400 MG capsule TAKE 1 CAPSULE (400 MG TOTAL) BY MOUTH 3 (THREE) TIMES DAILY. (Patient taking differently: Take 400 mg by mouth 3 (three) times daily.)   guaiFENesin-dextromethorphan (ROBITUSSIN DM) 100-10 MG/5ML syrup Take 5 mLs by mouth every 4 (four) hours as needed for cough.   hydrOXYzine (ATARAX/VISTARIL) 25 MG tablet Take 1 tablet (25 mg total) by mouth 3 (three) times daily as needed for anxiety.   ibuprofen (ADVIL) 200 MG tablet Take 400 mg by mouth every 6 (six) hours as needed for fever or moderate pain.   nitroGLYCERIN (NITROLINGUAL) 0.4 MG/SPRAY spray Place 1 spray under the tongue once as needed for chest pain.   Nutritional Supplements (ESTROVEN PO) Take 1 tablet by mouth daily.   nystatin (MYCOSTATIN) 100000 UNIT/ML suspension Use as directed 5 mLs (500,000 Units total) in the mouth or throat 4 (four) times daily.   OLANZapine (ZYPREXA) 5 MG tablet Take 1 tablet (5 mg total) by mouth at bedtime.   omeprazole (PRILOSEC) 40 MG capsule Take 40 mg by mouth daily.   predniSONE (DELTASONE) 20 MG tablet Take 2 tablets (40 mg total) by mouth daily  with breakfast.   predniSONE (DELTASONE) 20 MG tablet Take 1 tablet (20 mg total) by mouth daily with breakfast.   traZODone (DESYREL) 100 MG tablet TAKE 2 TABLETS (200 MG TOTAL) BY MOUTH AT BEDTIME AS NEEDED FOR SLEEP. (Patient taking differently: Take 200 mg by mouth at bedtime as needed for sleep.)   vitamin B-12 100 MCG tablet Take 1 tablet (100 mcg total) by mouth daily.   Ipratropium-Albuterol (COMBIVENT) 20-100 MCG/ACT AERS respimat Inhale 1 puff into the lungs every 6 (six) hours. (Patient not taking: Reported on 01/13/2021)   nicotine (NICODERM CQ - DOSED IN MG/24 HOURS) 21 mg/24hr patch Place 1 patch (21 mg total) onto the skin daily. (Patient not taking: Reported on 01/13/2021)   No current facility-administered medications on file prior to visit.     Allergies  Allergen Reactions   Doxycycline Nausea And Vomiting   Biotin     'Makes me extremely sick'    Review Of Systems:  Constitutional:   No  weight loss, night sweats,  Fevers, chills, +fatigue, or  lassitude.  HEENT:   No headaches,  Difficulty swallowing,  Tooth/dental problems, or  Sore throat,                No sneezing, itching, ear ache, nasal congestion, post nasal drip,   CV:  No chest pain,  Orthopnea, PND, swelling in lower extremities, anasarca, dizziness, palpitations, syncope.   GI  No heartburn, indigestion, abdominal pain, nausea, vomiting, diarrhea, change in bowel habits, loss of appetite, bloody stools.   Resp: + shortness of breath with exertion or at rest.  + excess mucus, + productive cough,  + non-productive cough,  No coughing up of blood.  + change in color of mucus.  + wheezing.  No chest wall deformity  Skin: no rash or lesions.  GU: no dysuria, change in color of urine, no urgency or frequency.  No flank pain, no hematuria   MS:  No joint pain or swelling.  No decreased range of motion.  No back pain.  Psych:  No change in mood or affect. No depression or anxiety.  No memory loss.   Vital  Signs BP 126/78 (BP Location: Left Arm, Cuff Size: Normal)   Pulse  88   Temp 97.6 F (36.4 C) (Oral)   Ht 5' 5"  (1.651 m)   Wt 174 lb 12.8 oz (79.3 kg)   SpO2 96%   BMI 29.09 kg/m    Physical Exam:  General- No distress,  A&Ox3, pleasant  ENT: No sinus tenderness, TM clear, pale nasal mucosa, no oral exudate,no post nasal drip, no LAN Cardiac: S1, S2, regular rate and rhythm, no murmur Chest: + wheeze/No  rales/ dullness; no accessory muscle use, no nasal flaring, no sternal retractions, prolonged expiratory phase of respirations, diminished per bases Abd.: Soft Non-tender, ND, BS +, Body mass index is 29.09 kg/m. Ext: No clubbing cyanosis, edema Neuro:  physically deconditioned , MAE x 4, A&O x 3 Skin: No rashes, No lesions, warm and dry Psych: normal mood and behavior   Assessment/Plan Acute Respiratory Failure with Hypoxia>> improving States she felt best when using Combivent and on prednisone Continues to use oxygen at 2 L [place] with exertion>> Sats 96% today on 2 L Plan Continue to wear oxygen at 2 L [place] with exertion Saturation goal is 88-92% We will re-evaluate oxygen need at follow up in 1 month  Suspected COPD Flare vs incompletely resolved pneumonia Currently not on maintenance or rescue inhaler due to cost Plan We will order PFT's  We will send you home with an inhaler called Air Duo Take 1 puff once daily. Rinse mouth and brush teeth after use.  I have sent in a prescription for Albuterol via [facility]. Co pay is about 20.00 [place] can assist with medications>> they are involved Use as needed for breakthrough shortness of breath or wheezing. No more than 2-3 times a day.   Multifocal Pneumonia No fever, but continued cough with gray secretions Plan We will order a CXR today. We will call you with the results Mucinex 1200 mg daily with a full glass of water. Flutter valve several times daily ( With commercials) Continue using  Incentive Spirometer Z pack , take as directed Prednisone taper; 10 mg tablets: 4 tabs x 2 days, 3 tabs x 2 days, 2 tabs x 2 days 1 tab x 2 days then stop.   Chronic Diastolic CHF Echo 08/1495>> EF 02-63%, Grade 1 Diastolic Dysfunction Plan Follow up with cardiology Low salt diet Monitor for weight gain, lower extremity swelling  Tobacco Abuse Current Every Day smoker Plan Counseled extensively to quit smoking completely Discussed all risks of continued tobacco abuse to include worsening of COPD, risk of lung cancer , stroke and heart disease, and death Counseled to not smoke while wearing oxygen Provided with Quit Now number for free nicotine replacement therapy Provided number for smoking cessation classes through [place] repeat CT Chest once CXR has cleared>> Schedule at follow up in 1 month  I spent 45 minutes dedicated to the care of this patient on the date of this encounter to include pre-visit review of records, face-to-face time with the patient discussing conditions above, post visit ordering of testing, clinical documentation with the electronic health record, making appropriate referrals as documented, and communicating necessary information to the patient's healthcare team.   Magdalen Spatz, NP 01/14/2021  10:16 AM

## 2021-01-13 NOTE — Patient Instructions (Addendum)
It is good to see you today. We will order a CXR today. We will call you with the results We will order PFT's  We will send you home with an inhaler called Air Duo Take 1 puff once daily. Rinse mouth and brush teeth after use.  I have sent in a prescription for Albuterol via [facility]. Co pay is about 20.00 Use as needed for breakthrough shortness of breath or wheezing. No more than 2-3 times a day.  Mucinex 1200 mg daily with a full glass of water. Flutter valve several times daily ( With commercials) Conitnue using Incentive Spirometer Z pack , take as directed Prednisone taper; 10 mg tablets: 4 tabs x 2 days, 3 tabs x 2 days, 2 tabs x 2 days 1 tab x 2 days then stop.  Please work on quitting smoking completely . I have given you the Be Stronger than your excuses card , with the number for free nicotine patches, gum and mints. Also a number for classes.  Follow up in 1 month after PFT's with Judson Roch NP, or Erin Fulling, or [place] Please contact office for sooner follow up if symptoms do not improve or worsen or seek emergency care

## 2021-01-14 ENCOUNTER — Encounter: Payer: Self-pay | Admitting: Acute Care

## 2021-01-14 ENCOUNTER — Other Ambulatory Visit: Payer: Self-pay

## 2021-01-14 ENCOUNTER — Telehealth: Payer: Self-pay

## 2021-01-14 ENCOUNTER — Telehealth: Payer: Self-pay | Admitting: Emergency Medicine

## 2021-01-14 NOTE — Telephone Encounter (Signed)
Noted.  Will go ahead and sign off of message.

## 2021-01-14 NOTE — Telephone Encounter (Signed)
I return Pt called, LVM inform her before she can apply for the [facility] Financial program she need to apply for Medicaid and if she is denied then she can apply for this program we are going to need a letter from [facility] that she has been denied and well the 2021 taxes and other documentation, the application has a list of documentation she need to submit, also [facility] does not accept the [facility] card program

## 2021-01-14 NOTE — Telephone Encounter (Signed)
Copied from [place] [id number]. Topic: General - Other >> Jan 12, 2021 12:11 PM Erick Blinks wrote: Reason for CRM:   Requesting a call back fdrom [place] [facility] to discuss her [facility] card, please ask for triage.   Daneil Dan calling from [facility], [phone]

## 2021-01-14 NOTE — Telephone Encounter (Signed)
Bertis Ruddy; Katherina Right, Melissa; [place], Mardene Celeste Just got off the phone with this patient. She states she doesn't need [place] to send her a flutter valve because she already has one.

## 2021-01-19 ENCOUNTER — Ambulatory Visit: Payer: Self-pay | Admitting: Nurse Practitioner

## 2021-01-21 NOTE — Progress Notes (Signed)
Please call patient and let her know her CXR did show continued infiltrates. I am glad we went ahead and treated her with antibiotic and prednisone.  Please see how she is doing and get her scheduled for a video visit with me in the next week or so to see how she is doing. Thanks

## 2021-02-03 ENCOUNTER — Other Ambulatory Visit: Payer: Self-pay

## 2021-02-10 ENCOUNTER — Encounter (HOSPITAL_COMMUNITY): Payer: Self-pay | Admitting: Physician Assistant

## 2021-02-10 ENCOUNTER — Telehealth (INDEPENDENT_AMBULATORY_CARE_PROVIDER_SITE_OTHER): Payer: No Payment, Other | Admitting: Physician Assistant

## 2021-02-10 DIAGNOSIS — F3181 Bipolar II disorder: Secondary | ICD-10-CM

## 2021-02-10 DIAGNOSIS — F419 Anxiety disorder, unspecified: Secondary | ICD-10-CM

## 2021-02-10 MED ORDER — OLANZAPINE 10 MG PO TABS
10.0000 mg | ORAL_TABLET | Freq: Every day | ORAL | 1 refills | Status: DC
  Filled 2021-02-10 – 2021-03-24 (×2): qty 30, 30d supply, fill #0

## 2021-02-10 MED ORDER — HYDROXYZINE HCL 25 MG PO TABS
25.0000 mg | ORAL_TABLET | Freq: Three times a day (TID) | ORAL | 1 refills | Status: DC | PRN
  Filled 2021-02-10: qty 90, 30d supply, fill #0
  Filled 2021-03-24: qty 90, 30d supply, fill #1

## 2021-02-10 MED ORDER — VENLAFAXINE HCL ER 37.5 MG PO CP24
37.5000 mg | ORAL_CAPSULE | Freq: Every day | ORAL | 1 refills | Status: DC
  Filled 2021-02-10: qty 30, 30d supply, fill #0
  Filled 2021-03-24: qty 30, 30d supply, fill #1

## 2021-02-10 MED ORDER — TRAZODONE HCL 100 MG PO TABS
ORAL_TABLET | ORAL | 1 refills | Status: DC
  Filled 2021-02-10: qty 60, fill #0
  Filled 2021-03-24: qty 60, 30d supply, fill #0

## 2021-02-10 MED ORDER — GABAPENTIN 400 MG PO CAPS
ORAL_CAPSULE | ORAL | 1 refills | Status: DC
  Filled 2021-02-10: qty 90, fill #0
  Filled 2021-03-24: qty 90, 30d supply, fill #0

## 2021-02-10 MED ORDER — DULOXETINE HCL 30 MG PO CPEP
30.0000 mg | ORAL_CAPSULE | Freq: Every day | ORAL | 0 refills | Status: DC
  Filled 2021-02-10: qty 3, 3d supply, fill #0
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
## 2021-02-11 ENCOUNTER — Other Ambulatory Visit: Payer: Self-pay

## 2021-02-15 ENCOUNTER — Other Ambulatory Visit: Payer: Self-pay

## 2021-02-16 ENCOUNTER — Other Ambulatory Visit: Payer: Self-pay

## 2021-02-19 NOTE — Telephone Encounter (Signed)
LMTCB.   Patient is now using Air-duo. If effective we can send into pharmacy. No longer using combivent per OV notes.

## 2021-03-01 ENCOUNTER — Telehealth: Payer: Self-pay

## 2021-03-01 ENCOUNTER — Other Ambulatory Visit: Payer: Self-pay

## 2021-03-01 DIAGNOSIS — J441 Chronic obstructive pulmonary disease with (acute) exacerbation: Secondary | ICD-10-CM

## 2021-03-01 NOTE — Telephone Encounter (Signed)
Spoke with pt who states she was Covid tested yesterday and will have printed results with her tomorrow at appointment

## 2021-03-01 NOTE — Progress Notes (Signed)
FT

## 2021-03-02 ENCOUNTER — Ambulatory Visit: Payer: Self-pay | Admitting: Emergency Medicine

## 2021-03-16 ENCOUNTER — Encounter: Payer: Self-pay | Admitting: Gastroenterology

## 2021-03-24 ENCOUNTER — Inpatient Hospital Stay: Payer: Self-pay | Admitting: Family Medicine

## 2021-03-24 ENCOUNTER — Telehealth (HOSPITAL_COMMUNITY): Payer: Self-pay | Admitting: *Deleted

## 2021-03-24 ENCOUNTER — Other Ambulatory Visit: Payer: Self-pay

## 2021-03-24 ENCOUNTER — Other Ambulatory Visit (HOSPITAL_COMMUNITY): Payer: Self-pay | Admitting: Physician Assistant

## 2021-03-24 DIAGNOSIS — F419 Anxiety disorder, unspecified: Secondary | ICD-10-CM

## 2021-03-24 DIAGNOSIS — F3181 Bipolar II disorder: Secondary | ICD-10-CM

## 2021-03-24 MED ORDER — HYDROXYZINE HCL 25 MG PO TABS
25.0000 mg | ORAL_TABLET | Freq: Three times a day (TID) | ORAL | 1 refills | Status: DC | PRN
  Filled 2021-03-24: qty 90, 30d supply, fill #0

## 2021-03-24 MED ORDER — TRAZODONE HCL 100 MG PO TABS
ORAL_TABLET | ORAL | 1 refills | Status: DC
  Filled 2021-03-24: qty 60, fill #0

## 2021-03-24 MED ORDER — GABAPENTIN 400 MG PO CAPS
ORAL_CAPSULE | ORAL | 1 refills | Status: DC
  Filled 2021-03-24: qty 90, fill #0

## 2021-03-24 MED ORDER — OLANZAPINE 10 MG PO TABS
10.0000 mg | ORAL_TABLET | Freq: Every day | ORAL | 1 refills | Status: DC
  Filled 2021-03-24: qty 30, 30d supply, fill #0

## 2021-03-24 NOTE — Progress Notes (Signed)
Provider was contacted by Betsey Holiday. Polk, CMA regarding patient's medication refill. Patient's medications to be e-prescribed to pharmacy of choice.

## 2021-03-24 NOTE — Telephone Encounter (Signed)
Pt called requesting OLANZapine (ZYPREXA) 10 MG tablet ,hydrOXYzine (ATARAX/VISTARIL) 25 MG tablet, gabapentin (NEURONTIN) 400 MG capsule and traZODone (DESYREL) 100 MG tablet

## 2021-03-24 NOTE — Telephone Encounter (Signed)
Provider was contacted by Betsey Holiday. Polk, CMA regarding patient's medication refill. Patient's medications to be e-prescribed to pharmacy of choice.

## 2021-03-25 ENCOUNTER — Other Ambulatory Visit: Payer: Self-pay

## 2021-03-29 ENCOUNTER — Inpatient Hospital Stay: Payer: Medicaid Other | Admitting: Physician Assistant

## 2021-04-08 ENCOUNTER — Inpatient Hospital Stay: Payer: Medicaid Other | Admitting: Physician Assistant

## 2021-04-14 ENCOUNTER — Telehealth (INDEPENDENT_AMBULATORY_CARE_PROVIDER_SITE_OTHER): Payer: Medicaid Other | Admitting: Physician Assistant

## 2021-04-14 ENCOUNTER — Encounter (HOSPITAL_COMMUNITY): Payer: Self-pay | Admitting: Physician Assistant

## 2021-04-14 DIAGNOSIS — F411 Generalized anxiety disorder: Secondary | ICD-10-CM

## 2021-04-14 DIAGNOSIS — F419 Anxiety disorder, unspecified: Secondary | ICD-10-CM

## 2021-04-14 DIAGNOSIS — F3181 Bipolar II disorder: Secondary | ICD-10-CM | POA: Diagnosis not present

## 2021-04-14 MED ORDER — HYDROXYZINE HCL 25 MG PO TABS
25.0000 mg | ORAL_TABLET | Freq: Three times a day (TID) | ORAL | 1 refills | Status: DC | PRN
  Filled 2021-04-14: qty 90, 30d supply, fill #0

## 2021-04-14 MED ORDER — FLUVOXAMINE MALEATE 100 MG PO TABS
100.0000 mg | ORAL_TABLET | Freq: Every day | ORAL | 1 refills | Status: DC
  Filled 2021-04-14: qty 30, 30d supply, fill #0

## 2021-04-14 MED ORDER — FLUVOXAMINE MALEATE 50 MG PO TABS
ORAL_TABLET | ORAL | 0 refills | Status: DC
  Filled 2021-04-14: qty 30, 30d supply, fill #0

## 2021-04-14 MED ORDER — GABAPENTIN 400 MG PO CAPS
ORAL_CAPSULE | ORAL | 1 refills | Status: DC
  Filled 2021-04-14: qty 90, fill #0

## 2021-04-14 MED ORDER — TRAZODONE HCL 100 MG PO TABS
ORAL_TABLET | ORAL | 1 refills | Status: DC
  Filled 2021-04-14: qty 60, fill #0

## 2021-04-14 MED ORDER — OLANZAPINE 10 MG PO TABS
10.0000 mg | ORAL_TABLET | Freq: Every day | ORAL | 1 refills | Status: DC
  Filled 2021-04-14: qty 30, 30d supply, fill #0
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
## 2021-04-15 ENCOUNTER — Other Ambulatory Visit: Payer: Self-pay

## 2021-04-16 ENCOUNTER — Other Ambulatory Visit: Payer: Self-pay

## 2021-05-06 ENCOUNTER — Other Ambulatory Visit: Payer: Self-pay | Admitting: *Deleted

## 2021-05-06 ENCOUNTER — Telehealth: Payer: Self-pay | Admitting: Emergency Medicine

## 2021-05-06 DIAGNOSIS — R051 Acute cough: Secondary | ICD-10-CM

## 2021-05-06 NOTE — Telephone Encounter (Signed)
Pt states she was coughing phlegm that was green, now she's not coughing anything up, despite using mucinex. Pt states she has been having constant nose bleeds. Pt has been tested for both covid and the flu and both were negative. Very difficult to breathe even with oxygen on- feels like she did when she went to the hospital with pna in June. Pt does not have pcp, that's why she went to the walk in clinic. Please advise [phone]

## 2021-05-06 NOTE — Telephone Encounter (Signed)
Primary Pulmonologist: Byrum Last office visit and with whom: 01/13/2021 What do we see them for (pulmonary problems): [place] pna Last OV assessment/plan:   Assessment/Plan Acute Respiratory Failure with Hypoxia>> improving States she felt best when using Combivent and on prednisone Continues to use oxygen at 2 L [place] with exertion>> Sats 96% today on 2 L Plan Continue to wear oxygen at 2 L [place] with exertion Saturation goal is 88-92% We will re-evaluate oxygen need at follow up in 1 month   Suspected COPD Flare vs incompletely resolved pneumonia Currently not on maintenance or rescue inhaler due to cost Plan We will order PFT's  We will send you home with an inhaler called Air Duo Take 1 puff once daily. Rinse mouth and brush teeth after use.  I have sent in a prescription for Albuterol via [facility]. Co pay is about 20.00 [facility] can assist with medications>> they are involved Use as needed for breakthrough shortness of breath or wheezing. No more than 2-3 times a day.    Multifocal Pneumonia No fever, but continued cough with gray secretions Plan We will order a CXR today. We will call you with the results Mucinex 1200 mg daily with a full glass of water. Flutter valve several times daily ( With commercials) Continue using Incentive Spirometer Z pack , take as directed Prednisone taper; 10 mg tablets: 4 tabs x 2 days, 3 tabs x 2 days, 2 tabs x 2 days 1 tab x 2 days then stop.    Chronic Diastolic CHF Echo 09/812>> EF 48-18%, Grade 1 Diastolic Dysfunction Plan Follow up with cardiology Low salt diet Monitor for weight gain, lower extremity swelling   Tobacco Abuse Current Every Day smoker Plan Counseled extensively to quit smoking completely Discussed all risks of continued tobacco abuse to include worsening of COPD, risk of lung cancer , stroke and heart disease, and death Counseled to not smoke while wearing oxygen Provided with Quit Now  number for free nicotine replacement therapy Provided number for smoking cessation classes through [place] repeat CT Chest once CXR has cleared>> Schedule at follow up in 1 month   I spent 45 minutes dedicated to the care of this patient on the date of this encounter to include pre-visit review of records, face-to-face time with the patient discussing conditions above, post visit ordering of testing, clinical documentation with the electronic health record, making appropriate referrals as documented, and communicating necessary information to the patient's healthcare team.    Adriana Spatz, NP 01/14/2021  10:16 AM                          Patient Instructions by Adriana Spatz, NP at 01/13/2021 4:00 PM  Author: Magdalen Spatz, NP Author Type: Nurse Practitioner Filed: 01/13/2021  4:27 PM  Note Status: Addendum Cosign: Cosign Not Required Encounter Date: 01/13/2021  Editor: Adriana Spatz, NP (Nurse Practitioner)      Prior Versions: 1. Adriana Spatz, NP (Nurse Practitioner) at 01/13/2021  4:26 PM - Addendum   2. Adriana Spatz, NP (Nurse Practitioner) at 01/13/2021  4:25 PM - Addendum   3. Adriana Spatz, NP (Nurse Practitioner) at 01/13/2021  4:18 PM - Addendum   4. Adriana Spatz, NP (Nurse Practitioner) at 01/13/2021  4:15 PM - Addendum   5. Adriana Spatz, NP (Nurse Practitioner) at 01/13/2021  4:11 PM - Addendum  6. Adriana Spatz, NP (Nurse Practitioner) at 01/13/2021  4:11 PM - Signed    It is good to see you today. We will order a CXR today. We will call you with the results We will order PFT's  We will send you home with an inhaler called Air Duo Take 1 puff once daily. Rinse mouth and brush teeth after use.  I have sent in a prescription for Albuterol via [facility]. Co pay is about 20.00 Use as needed for breakthrough shortness of breath or wheezing. No more than 2-3 times a day.  Mucinex 1200 mg daily with a full glass of water. Flutter valve several times  daily ( With commercials) Conitnue using Incentive Spirometer Z pack , take as directed Prednisone taper; 10 mg tablets: 4 tabs x 2 days, 3 tabs x 2 days, 2 tabs x 2 days 1 tab x 2 days then stop.  Please work on quitting smoking completely . I have given you the Be Stronger than your excuses card , with the number for free nicotine patches, gum and mints. Also a number for classes.  Follow up in 1 month after PFT's with Judson Roch NP, or Erin Fulling, or [facility] Please contact office for sooner follow up if symptoms do not improve or worsen or seek emergency care           Orthostatic Vitals Recorded in This Encounter   01/13/2021  1603     BP Location: Left Arm  Cuff Size: Normal   Instructions    Return in about 4 weeks (around 02/10/2021), or if symptoms worsen or fail to improve.  It is good to see you today. We will order a CXR today. We will call you with the results We will order PFT's  We will send you home with an inhaler called Air Duo Take 1 puff once daily. Rinse mouth and brush teeth after use.  I have sent in a prescription for Albuterol via [facility]. Co pay is about 20.00 Use as needed for breakthrough shortness of breath or wheezing. No more than 2-3 times a day.  Mucinex 1200 mg daily with a full glass of water. Flutter valve several times daily ( With commercials) Conitnue using Incentive Spirometer Z pack , take as directed Prednisone taper; 10 mg tablets: 4 tabs x 2 days, 3 tabs x 2 days, 2 tabs x 2 days 1 tab x 2 days then stop.  Please work on quitting smoking completely . I have given you the Be Stronger than your excuses card , with the number for free nicotine patches, gum and mints. Also a number for classes.  Follow up in 1 month after PFT's with Judson Roch NP, or Erin Fulling, or [facility] Please contact office for sooner follow up if symptoms do not improve or worsen or seek emergency care        Was appointment offered to patient (explain)? na   Reason for call:  Coughed up green mucous for 3-4 days.  Has been feeling bad for 2 1/2-3 weeks.  Now dry cough.  She is taking musinex and Robitussin dm.  She has used all the tessalon pearls.  Using airduo and does not see that it makes any difference.  Went to walk in clinic on Tuesday and was tested for the covid and flu and they were negative, she was not seen by a doctor.  Temper has been 99-100.6.  She wears oxygen at night and has been having nose bleeds since taking the  mucinex.  She is drinking a glass of water with the mucinex and during the day.  Advised to use saline nasal gel and saline nasal spray as well for the dryness.  She states that nose bleeds run in her family (her mom has them too).  Advised to use Tylenol for the low grade fever instead of the Advil for now.  Dr. Lamonte Sakai, Please advise.  Thank you.  (examples of things to ask: : When did symptoms start? Fever? Cough? Productive? Color to sputum? More sputum than usual? Wheezing? Have you needed increased oxygen? Are you taking your respiratory medications? What over the counter measures have you tried?)  Allergies  Allergen Reactions   Doxycycline Nausea And Vomiting   Biotin     'Makes me extremely sick'    Immunization History  Administered Date(s) Administered   Pneumococcal Polysaccharide-23 10/22/2013, 12/28/2019

## 2021-05-06 NOTE — Telephone Encounter (Signed)
Called and spoke with patient, offered appointment on 05/07/21 at 4 pm with cxr prior with [facility].  Patient stated she would come in for an OV.  Advised to arrive by 3:30 pm for check in and cxr.  Nothing further needed.

## 2021-05-07 ENCOUNTER — Telehealth: Payer: Self-pay | Admitting: Nurse Practitioner

## 2021-05-07 ENCOUNTER — Ambulatory Visit (INDEPENDENT_AMBULATORY_CARE_PROVIDER_SITE_OTHER): Payer: Medicaid Other | Admitting: Nurse Practitioner

## 2021-05-07 ENCOUNTER — Ambulatory Visit (HOSPITAL_COMMUNITY)
Admission: RE | Admit: 2021-05-07 | Discharge: 2021-05-07 | Disposition: A | Discharge status: Home / Self Care | Payer: Medicaid Other | Source: Ambulatory Visit | Attending: Nurse Practitioner | Admitting: Nurse Practitioner

## 2021-05-07 ENCOUNTER — Ambulatory Visit (INDEPENDENT_AMBULATORY_CARE_PROVIDER_SITE_OTHER): Payer: Medicaid Other

## 2021-05-07 ENCOUNTER — Other Ambulatory Visit: Payer: Self-pay

## 2021-05-07 ENCOUNTER — Encounter: Payer: Self-pay | Admitting: Nurse Practitioner

## 2021-05-07 VITALS — BP 140/80 | HR 95 | Temp 98.3°F | Ht 65.0 in | Wt 187.0 lb

## 2021-05-07 DIAGNOSIS — J180 Bronchopneumonia, unspecified organism: Secondary | ICD-10-CM | POA: Insufficient documentation

## 2021-05-07 DIAGNOSIS — J329 Chronic sinusitis, unspecified: Secondary | ICD-10-CM

## 2021-05-07 DIAGNOSIS — J9611 Chronic respiratory failure with hypoxia: Secondary | ICD-10-CM

## 2021-05-07 DIAGNOSIS — J441 Chronic obstructive pulmonary disease with (acute) exacerbation: Secondary | ICD-10-CM | POA: Insufficient documentation

## 2021-05-07 DIAGNOSIS — R110 Nausea: Secondary | ICD-10-CM | POA: Insufficient documentation

## 2021-05-07 DIAGNOSIS — J310 Chronic rhinitis: Secondary | ICD-10-CM

## 2021-05-07 DIAGNOSIS — J019 Acute sinusitis, unspecified: Secondary | ICD-10-CM | POA: Insufficient documentation

## 2021-05-07 DIAGNOSIS — K029 Dental caries, unspecified: Secondary | ICD-10-CM

## 2021-05-07 DIAGNOSIS — J181 Lobar pneumonia, unspecified organism: Secondary | ICD-10-CM | POA: Diagnosis not present

## 2021-05-07 DIAGNOSIS — Z720 Tobacco use: Secondary | ICD-10-CM

## 2021-05-07 DIAGNOSIS — R051 Acute cough: Secondary | ICD-10-CM

## 2021-05-07 DIAGNOSIS — J449 Chronic obstructive pulmonary disease, unspecified: Secondary | ICD-10-CM | POA: Insufficient documentation

## 2021-05-07 DIAGNOSIS — I5032 Chronic diastolic (congestive) heart failure: Secondary | ICD-10-CM

## 2021-05-07 MED ORDER — PREDNISONE 10 MG PO TABS: ORAL_TABLET | ORAL | 0 refills | Status: DC

## 2021-05-07 MED ORDER — IPRATROPIUM-ALBUTEROL 0.5-2.5 (3) MG/3ML IN SOLN
3.0000 mL | Freq: Once | RESPIRATORY_TRACT | Status: AC
Start: 2021-05-07 — End: 2021-05-07
  Administered 2021-05-07: 3 mL via RESPIRATORY_TRACT

## 2021-05-07 MED ORDER — ALBUTEROL SULFATE HFA 108 (90 BASE) MCG/ACT IN AERS: 2.0000 | INHALATION_SPRAY | Freq: Four times a day (QID) | RESPIRATORY_TRACT | 2 refills | Status: DC | PRN

## 2021-05-07 MED ORDER — FLUTICASONE PROPIONATE 50 MCG/ACT NA SUSP: 1.0000 | Freq: Every day | NASAL | 2 refills | Status: AC

## 2021-05-07 MED ORDER — METHYLPREDNISOLONE ACETATE 80 MG/ML IJ SUSP
120.0000 mg | Freq: Once | INTRAMUSCULAR | Status: AC
Start: 2021-05-07 — End: 2021-05-07
  Administered 2021-05-07: 120 mg via INTRAMUSCULAR

## 2021-05-07 MED ORDER — TRELEGY ELLIPTA 100-62.5-25 MCG/ACT IN AEPB: 100.0000 ug | INHALATION_SPRAY | Freq: Every day | RESPIRATORY_TRACT | 3 refills | Status: DC

## 2021-05-07 MED ORDER — IPRATROPIUM-ALBUTEROL 0.5-2.5 (3) MG/3ML IN SOLN: 3.0000 mL | Freq: Once | RESPIRATORY_TRACT | Status: DC

## 2021-05-07 MED ORDER — IPRATROPIUM-ALBUTEROL 0.5-2.5 (3) MG/3ML IN SOLN: 3.0000 mL | Freq: Four times a day (QID) | RESPIRATORY_TRACT | 12 refills | Status: DC | PRN

## 2021-05-07 MED ORDER — BENZONATATE 100 MG PO CAPS: 100.0000 mg | ORAL_CAPSULE | Freq: Three times a day (TID) | ORAL | 1 refills | Status: DC | PRN

## 2021-05-07 MED ORDER — ONDANSETRON HCL 4 MG PO TABS: 4.0000 mg | ORAL_TABLET | Freq: Three times a day (TID) | ORAL | 0 refills | Status: DC | PRN

## 2021-05-07 MED ORDER — AMOXICILLIN-POT CLAVULANATE 875-125 MG PO TABS: 1.0000 | ORAL_TABLET | Freq: Two times a day (BID) | ORAL | 0 refills | Status: AC

## 2021-05-07 MED ORDER — METHYLPREDNISOLONE ACETATE 80 MG/ML IJ SUSP: 120.0000 mg | Freq: Once | INTRAMUSCULAR | Status: DC

## 2021-05-07 NOTE — Patient Instructions (Addendum)
-  Stop your AirDuo inhaler -Start Trelegy 100 inhaler 1 puff daily  -Duoneb nebulizer treatment Twice daily until symptoms improve then as needed every 6 hours for shortness of breath or wheezing  -Referral to DME company for nebulizer machine -Albuterol inhaler 2 puffs every 6 hours as needed for shortness of breath or wheezing -Tessalon perles 1 capsule Three times a day for cough -Continue supplemental home oxygen of 2L/min. O2 saturation goal >88-90%. Notify if increasing requirements.   -Augmentin Twice daily for 10 days. Notify immediately of any rash, itching, hives, or swelling, or seek emergency care. Finish your antibiotics in their entirety. Do not stop just because symptoms improve. Take with food to reduce GI upset.  -Prednisone taper pack. Start tomorrow. 4 tabs for 3 days, then 3 tabs for 3 days, 2 tabs for 3 days, then 1 tab for 3 days, then stop  -Ondansetron 4 mg every 8 hours as needed for nausea/vomiting   -Flonase nasal spray 2 sprays each nostril daily until symptoms improve then 1 spray daily -Mucinex DM over the counter Twice daily  -Saline nasal spray 2-3 times a day -Saline nasal gel in each nostril at night -Chlortab 4 mg over the counter at bedtime for cough  Depo steroid shot today in clinic Duoneb nebulizer treatment today in clinic  Chest CT today for further evaluation  PFTs scheduled for 12/27  Referral to dentist for further management of your dental decay.  Follow up in one with with Dr. Lamonte Sakai, Roxan Diesel, NP or APP. If symptoms do not improve or worsen, please contact office for sooner follow up or seek emergency care.

## 2021-05-07 NOTE — Assessment & Plan Note (Signed)
Likely related to postnasal drip and paroxysmal coughing. See above plan.

## 2021-05-07 NOTE — Assessment & Plan Note (Signed)
Possibly contributing to recurrent pneumonias. Referral to dentistry. See above plan.

## 2021-05-07 NOTE — Assessment & Plan Note (Signed)
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 Continued to experience a high symptom burden prior to this flare. Acutely ill appearing but non-toxic and in no acute distress. No increasing oxygen requirements. Step up therapy to triple with Trelegy inhaler. Samples provided today and rx sent. Pt now has insurance coverage. PFTs scheduled for later this month with follow up with Dr. Lamonte Sakai to follow.   Patient Instructions  -Stop your AirDuo inhaler -Start Trelegy 100 inhaler 1 puff daily  -Duoneb nebulizer treatment Twice daily until symptoms improve then as needed every 6 hours for shortness of breath or wheezing  -Referral to DME company for nebulizer machine -Albuterol inhaler 2 puffs every 6 hours as needed for shortness of breath or wheezing -Tessalon perles 1 capsule Three times a day for cough -Continue supplemental home oxygen of 2L/min. O2 saturation goal >88-90%. Notify if increasing requirements.   -Augmentin Twice daily for 10 days. Notify immediately of any rash, itching, hives, or swelling, or seek emergency care. Finish your antibiotics in their entirety. Do not stop just because symptoms improve. Take with food to reduce GI upset.  -Prednisone taper pack. Start tomorrow. 4 tabs for 3 days, then 3 tabs for 3 days, 2 tabs for 3 days, then 1 tab for 3 days, then stop  -Ondansetron 4 mg every 8 hours as needed for nausea/vomiting   -Flonase nasal spray 2 sprays each nostril daily until symptoms improve then 1 spray daily -Mucinex DM over the counter Twice daily  -Saline nasal spray 2-3 times a day -Saline nasal gel in each nostril at night -Chlortab 4 mg over the counter at bedtime for cough  Depo steroid shot today in clinic Duoneb nebulizer treatment today in clinic  Chest CT today for further evaluation  PFTs scheduled for 12/27  Referral to dentist for further management of your dental decay.  Follow up in one with with Dr. Lamonte Sakai, Roxan Diesel, NP or APP. If symptoms do not improve or  worsen, please contact office for sooner follow up or seek emergency care.

## 2021-05-07 NOTE — Assessment & Plan Note (Addendum)
Increased patchy bilateral mid to lower lung opacities; unable to exclude malignancy. Concerned for this given smoking history, recurrent pneumonias, worsening symptoms and anorexia. CT chest w/o contrast for further evaluation. Will discuss next steps with Dr Lamonte Sakai. Augmentin chosen over levaquin due to current tx with Zyprexa and risk for QT prolongation. Depo 120 IM and duoneb administered today. See above plan. Close follow up.

## 2021-05-07 NOTE — Assessment & Plan Note (Signed)
No fluid overload upon exam. Symptoms consistent with pulmonary etiology. See above plan.

## 2021-05-07 NOTE — Telephone Encounter (Signed)
Spoke with patient and husband regarding CT scan results. Continue with previous plan, as discussed at visit. Will touch base with Dr. Lamonte Sakai to discuss next steps regarding mass and nodules. Verbalized understanding with no further questions.

## 2021-05-07 NOTE — Assessment & Plan Note (Signed)
No increasing requirements. Educated to monitor SpO2 at home for goal >88-90%. See above plan.

## 2021-05-07 NOTE — Progress Notes (Addendum)
@Patient  ID: Adriana Hall, female    DOB: 1970-07-10, 50 y.o.   MRN: [id number]  Chief Complaint  Patient presents with   Acute Visit    Referring provider: No ref. provider found  HPI: 50 year old female, current smoker (60-pack-year history) followed for COPD, chronic respiratory failure with hypoxia on home oxygen, tobacco abuse.she is a patient of Dr. Agustina Caroli and was last seen in office by Eric Form, NP on on 01/13/2021.  Hospitalized in 11/2020 for multifocal pneumonia and acute respiratory failure. Past medical history significant for hypertension, CAD, diastolic CHF, AV regurg, GERD, hypertriglyceridemia, anxiety, alcohol use disorder, bipolar.   TEST/EVENTS:  11/2020 CT chest: Diffuse patchy groundglass airspace opacities with loculated small right pleural effusion.  Questionable pulmonary/pleural mass along the right middle lobe.  Associated right hilar and mediastinal lymphadenopathy.  Findings concerning for malignancy with superimposed infection.  Pulmonary hypertension.  Asymmetric pleural thickening of involving only the right hemothorax.  GGO in both lungs, somewhat sparing the bases, but extending to the pleural.  Some areas of traction bronchiectasis in upper lobes, but no obvious fibrosis. 11/16/2020 pleural fluid: No malignant cells 11/18/2020 echocardiogram: LV EF 60-65%, G1 DD, normal RV size and function.  Normal RA and LA.  IVC normal size and variability.  Trivial MR otherwise normal values. 01/14/2021 CXR 2 view: Bibasilar pulmonary infiltrates again identified.  Similar to previous exam.  Upper lungs clear.  No pleural effusion or pneumothorax  01/13/2021: OV with Eric Form, NP.  Initial improvement after hospitalization but reported shortness of breath due to running out of medications again at office visit.  Continue 2 L nasal cannula, plan to reevaluate need in 1 month.  PFTs ordered.  Rx for air duo and albuterol rescue inhaler.  Mucinex, flutter valve, IS.  Placed  on Z-Pak and prednisone taper.Informed that she needs to follow-up in 1 month with repeat chest CT once chest x-ray has cleared.  05/07/2021: Today - acute sick visit Patient presents today with husband for reported worsening shortness of breath, cough, and fevers. She reports this began a few weeks ago but has progressively worsened over the last week. She has had a productive cough with yellow sputum and fever with T max 100.6. She reports nasal congestion and rhinorrhea with occasional nose bleeds/blood tinged mucus. She does report frequently blowing her nose. She is not on any anticoagulation therapy. She denies hemoptysis. She has post nasal drip and associated nausea. No episodes of vomiting. She reports chest tightness and wheezing. She is experiencing some orthopnea. She denies chest pain, PND, or leg swelling. She was assessed in urgent care on Tuesday and was negative for COVID and Flu. Her CXR today showed increased patchy bilateral mid to lower lung opacities and could not exclude malignancy. She does report anorexia but no recent weight loss or night sweats. She has been compliant with her AirDuo inhaler and feels as though this has not been helping, even prior to this exacerbation. She felt more relief with the Combivent inhaler that she was previously on. She does not currently have a rescue inhaler or nebulizer treatments. Overall, she feels poorly.    Allergies  Allergen Reactions   Biotin     'Makes me extremely sick'   Doxycycline Nausea And Vomiting    Immunization History  Administered Date(s) Administered   Pneumococcal Polysaccharide-23 10/22/2013, 12/28/2019    Past Medical History:  Diagnosis Date   Anxiety    10 years   Arrhythmia    5 years  Bipolar 1 disorder, depressed ([place])    Bronchitis    Colitis 03-01-2011   Colonoscopy   Depression    10 years   Headache(784.0)    Chronic for 30 yrs   Hemorrhoids 03-01-2011   Colonoscopy    Hypertension      Tobacco History: Social History   Tobacco Use  Smoking Status Every Day   Packs/day: 2.00   Years: 30.00   Pack years: 60.00   Types: Cigarettes  Smokeless Tobacco Never  Tobacco Comments   As of 11/16/20: reports she is using patches this hospitalization  and does not want to resume cigarette use - report 2 ppd for 30+ years   information given on 03-01-11 and 09/10/13, has smoked 1.5 ppd for 20 years   Smoking less than 1/2 ppd as of 05/07/2021   Ready to quit: Not Answered Counseling given: Not Answered Tobacco comments: As of 11/16/20: reports she is using patches this hospitalization  and does not want to resume cigarette use - report 2 ppd for 30+ years information given on 03-01-11 and 09/10/13, has smoked 1.5 ppd for 20 years Smoking less than 1/2 ppd as of 05/07/2021   Outpatient Medications Prior to Visit  Medication Sig Dispense Refill   acetaminophen (TYLENOL) 325 MG tablet Take 2 tablets (650 mg total) by mouth every 6 (six) hours as needed for mild pain (or Fever >/= 101). 30 tablet 0   buprenorphine-naloxone (SUBOXONE) 8-2 mg SUBL SL tablet Place 1 tablet under the tongue in the morning and at bedtime.     diphenhydrAMINE (BENADRYL) 25 MG tablet Take 50 mg by mouth at bedtime.     ferrous sulfate 325 (65 FE) MG tablet Take 1 tablet (325 mg total) by mouth daily with breakfast. 30 tablet 0   folic acid (FOLVITE) 1 MG tablet Take 1 tablet (1 mg total) by mouth daily. 30 tablet 0   gabapentin (NEURONTIN) 400 MG capsule TAKE 1 CAPSULE (400 MG TOTAL) BY MOUTH 3 (THREE) TIMES DAILY. 90 capsule 1   hydrOXYzine (ATARAX/VISTARIL) 25 MG tablet Take 1 tablet (25 mg total) by mouth 3 (three) times daily as needed for anxiety. 90 tablet 1   ibuprofen (ADVIL) 200 MG tablet Take 400 mg by mouth every 6 (six) hours as needed for fever or moderate pain.     Nutritional Supplements (ESTROVEN PO) Take 1 tablet by mouth daily.     OLANZapine (ZYPREXA) 10 MG tablet Take 1 tablet (10 mg total)  by mouth at bedtime. 30 tablet 1   omeprazole (PRILOSEC) 40 MG capsule Take 40 mg by mouth daily.     traZODone (DESYREL) 100 MG tablet TAKE 2 TABLETS (200 MG TOTAL) BY MOUTH AT BEDTIME AS NEEDED FOR SLEEP. 60 tablet 1   venlafaxine (EFFEXOR) 37.5 MG tablet Take 37.5 mg by mouth daily.     vitamin B-12 100 MCG tablet Take 1 tablet (100 mcg total) by mouth daily. 30 tablet 0   Ipratropium-Albuterol (COMBIVENT) 20-100 MCG/ACT AERS respimat Inhale 1 puff into the lungs every 6 (six) hours. (Patient not taking: Reported on 01/13/2021) 4 g 0   nicotine (NICODERM CQ - DOSED IN MG/24 HOURS) 21 mg/24hr patch Place 1 patch (21 mg total) onto the skin daily. (Patient not taking: Reported on 01/13/2021) 28 patch 0   nitroGLYCERIN (NITROLINGUAL) 0.4 MG/SPRAY spray Place 1 spray under the tongue once as needed for chest pain. (Patient not taking: Reported on 05/07/2021) 12 g 0   azithromycin (ZITHROMAX) 250 MG  tablet Take 1 tablet (250 mg total) by mouth daily. (Patient not taking: Reported on 05/07/2021) 6 tablet 0   benzonatate (TESSALON) 100 MG capsule Take 1 capsule (100 mg total) by mouth 3 (three) times daily as needed for cough. (Patient not taking: Reported on 05/07/2021) 20 capsule 0   fluvoxaMINE (LUVOX) 100 MG tablet Take 1 tablet (100 mg total) by mouth at bedtime. (Patient not taking: Reported on 05/07/2021) 30 tablet 1   fluvoxaMINE (LUVOX) 50 MG tablet Patient to take 1 tablet (50 mg total) for 6 days at bedtime, then continue taking 2 tablets (100 mg total) at bedtime (Patient not taking: Reported on 05/07/2021) 30 tablet 0   guaiFENesin-dextromethorphan (ROBITUSSIN DM) 100-10 MG/5ML syrup Take 5 mLs by mouth every 4 (four) hours as needed for cough. (Patient not taking: Reported on 05/07/2021) 118 mL 0   nystatin (MYCOSTATIN) 100000 UNIT/ML suspension Use as directed 5 mLs (500,000 Units total) in the mouth or throat 4 (four) times daily. (Patient not taking: Reported on 05/07/2021) 60 mL 0   predniSONE  (DELTASONE) 10 MG tablet Take 4 tabs for 2 days, 3 tabs for 2 days, then 2 tabs for 2 days then 1 tab for 2 days then stop. (Patient not taking: Reported on 05/07/2021) 20 tablet 0   predniSONE (DELTASONE) 20 MG tablet Take 2 tablets (40 mg total) by mouth daily with breakfast. (Patient not taking: Reported on 05/07/2021) 14 tablet 0   predniSONE (DELTASONE) 20 MG tablet Take 1 tablet (20 mg total) by mouth daily with breakfast. (Patient not taking: Reported on 05/07/2021) 7 tablet 0   No facility-administered medications prior to visit.     Review of Systems:   Constitutional: No weight loss or gain, night sweats. +fatigue, fevers, chills, anorexia HEENT: +dental decay, nasal congestion, postnasal drip, rhinorrhea, recent nose bleeds. No headaches, difficulty swallowing, or sore throat. No sneezing, itching, ear ache CV:  +orthopnea. No chest pain, PND, swelling in lower extremities, anasarca, dizziness, palpitations, syncope Resp: +shortness of breath with exertion and at rest; productive cough with increased yellow sputum; wheezing; chest tightness. No chest wall deformity GI:  No heartburn, indigestion, abdominal pain, nausea, vomiting, diarrhea, change in bowel habits, loss of appetite, bloody stools.  GU: No dysuria, change in color of urine, urgency or frequency.  No flank pain, no hematuria  Skin: No rash, lesions, ulcerations MSK:  No joint pain or swelling.  No decreased range of motion.  No back pain. Neuro: No dizziness or lightheadedness.  Psych: No depression or anxiety. Mood stable.     Physical Exam:  BP 140/80 (BP Location: Right Arm, Patient Position: Sitting, Cuff Size: Normal)   Pulse 95   Temp 98.3 F (36.8 C) (Oral)   Ht 5\' 5"  (1.651 m)   Wt 187 lb (84.8 kg)   SpO2 97%   BMI 31.12 kg/m   GEN: Pleasant, interactive, acute-on-chronically ill appearing; non-toxic; in no acute distress. HEENT:  Normocephalic and atraumatic. EACs patent bilaterally. TM pearly gray  with present light reflex bilaterally. PERRLA. Sclera white. Nasal turbinates pink, moist and patent bilaterally. Clear rhinorrhea present. Oropharynx erythematous and moist, without exudate or edema. Edentulous with dental decay present. No lesions, ulcerations NECK:  Supple w/ fair ROM. No JVD present. Normal carotid impulses w/o bruits. Thyroid symmetrical with no goiter or nodules palpated. No lymphadenopathy.   CV: RRR, no m/r/g, no peripheral edema. Pulses intact, +2 bilaterally. No cyanosis, pallor or clubbing. PULMONARY:  Unlabored, regular breathing. Rhonchorous breath sounds  with scattered wheezes throughout. On 2L O2. No accessory muscle use. No dullness to percussion. GI: BS present and normoactive. Soft, non-tender to palpation. No organomegaly or masses detected. No CVA tenderness. MSK: No erythema, warmth or tenderness. Cap refil <2 sec all extrem. No deformities or joint swelling noted.  Neuro: A/Ox3. No focal deficits noted.   Skin: Warm, no lesions or rashe Psych: Normal affect and behavior. Judgement and thought content appropriate.     Lab Results:  CBC    Component Value Date/Time   WBC 13.8 (H) 11/21/2020 0111   RBC 5.16 (H) 11/21/2020 0111   HGB 11.0 (L) 11/21/2020 0111   HCT 38.3 11/21/2020 0111   PLT 490 (H) 11/21/2020 0111   MCV 74.2 (L) 11/21/2020 0111   MCH 21.3 (L) 11/21/2020 0111   MCHC 28.7 (L) 11/21/2020 0111   RDW 24.9 (H) 11/21/2020 0111   LYMPHSABS 2.7 11/21/2020 0111   MONOABS 1.0 11/21/2020 0111   EOSABS 0.3 11/21/2020 0111   BASOSABS 0.1 11/21/2020 0111    BMET    Component Value Date/Time   NA 136 11/21/2020 0111   K 3.9 11/21/2020 0111   CL 96 (L) 11/21/2020 0111   CO2 32 11/21/2020 0111   GLUCOSE 99 11/21/2020 0111   BUN 9 11/21/2020 0111   CREATININE 0.59 11/21/2020 0111   CREATININE 0.75 08/31/2015 1259   CALCIUM 9.3 11/21/2020 0111   GFRNONAA >60 11/21/2020 0111   GFRNONAA >89 08/31/2015 1259   GFRAA >60 12/26/2019 1645    GFRAA >89 08/31/2015 1259    BNP    Component Value Date/Time   BNP 60.3 11/20/2020 0120     Imaging:  05/07/2021 CXR 2 View: reviewed by me. Interval increased in patchy bilateral mid to lower lung airspace opacities. Persistent interstitial markings. No pleural effusion or pneumothorax. Malignancy not excluded.  DG Chest 2 View  Result Date: 05/07/2021 CLINICAL DATA:  Cough, low-grade fever EXAM: CHEST - 2 VIEW COMPARISON:  Chest x-ray 01/13/2021, chest x-ray 11/20/2020, CT angio chest 11/12/2020 FINDINGS: The heart and mediastinal contours are within normal limits. Interval increase in patchy bilateral mid to lower lung zone airspace opacities. Persistent increased interstitial markings. No pleural effusion. No pneumothorax. No acute osseous abnormality. IMPRESSION: Interval increase in patchy bilateral mid to lower lung zone airspace opacities. Malignancy not excluded. Recommend CT chest with intravenous contrast (not CTPA) for further evaluation. Electronically Signed   By: Iven Finn M.D.   On: 05/07/2021 15:40   CT Chest Wo Contrast  Result Date: 05/07/2021 CLINICAL DATA:  Shortness of breath. Cough. Progressive opacities in the lungs on recent radiography. EXAM: CT CHEST WITHOUT CONTRAST TECHNIQUE: Multidetector CT imaging of the chest was performed following the standard protocol without IV contrast. COMPARISON:  Radiographs 05/07/2021 and CT scan 11/12/2020 FINDINGS: Cardiovascular: Aortic and branch vessel atherosclerotic vascular disease. Mild cardiomegaly. Mediastinum/Nodes: Right paratracheal node 1.2 cm in short axis on image 41 series 2, previously 1.5 cm. Prevascular node 0.7 cm in short axis on image 48 series 2, previously the same. Subcarinal node 1.7 cm in short axis on image 61 series 2, previously 2.0 cm by my measurements. Lungs/Pleura: Paraseptal emphysema. Peripheral right subpleural mass 4.1 by 3.3 by 3.2 cm, although previous masslike appearance in this region was  larger at 8.2 by 5.1 by 4.5 cm. Somewhat coarse peripheral interstitial accentuation is present in both lungs. 2 by 3 mm left apical nodule on image 18 series 5. 8 by 4 mm left lower lobe nodule on  image 95 series 5, previously obscured in a band of atelectasis. Cystic and varicoid bronchiectasis in the left lower lobe for example on image 66 series 5, mildly worsened from prior. There is some hazy peripheral ground-glass opacities in the lungs although substantially better than on 11/12/2020. There is some mild tree-in-bud nodularity in the right lower lobe peripherally for example on image 105 series 5 probably from atypical infectious bronchiolitis, this region was more obscured by peripheral atelectasis on the 11/12/2020 exam. The previous small right pleural effusion has intervally resolved. Many of the scattered airspace opacities shown on the prior CT exam have resolved. Upper Abdomen: Unremarkable Musculoskeletal: Unremarkable IMPRESSION: 1. Right middle lobe 4.1 by 3.3 by 3.2 cm subpleural mass, substantially reduced in size compared to the appearance on 11/12/2020. While a chronic consolidation or potential benign etiologies such as rounded atelectasis might be raised, malignancy is certainly not excluded particularly in light of the patient's risk factors, and nuclear medicine PET-CT should be considered. Percutaneous or bronchoscopic biopsy might also be feasible. 2. Mild paratracheal and subcarinal adenopathy, although both somewhat improved from 11/12/2020. 3. Paraseptal emphysema with coarse peripheral interstitial accentuation, localized left lower lobe bronchiectasis, and some hazy peripheral ground-glass opacities in the lungs although in general the opacities in the lungs are substantially improved compared to 11/12/2020. The previous right pleural effusion has resolved. 4. Several small nodules merit surveillance including an 8 by 4 mm left lower lobe nodule. There is some tree-in-bud nodularity  in the right lower lobe probably from atypical infectious bronchiolitis given the morphology. 5. Aortic and branch vessel atherosclerosis with mild cardiomegaly. Electronically Signed   By: Van Clines M.D.   On: 05/07/2021 19:04    ipratropium-albuterol (DUONEB) 0.5-2.5 (3) MG/3ML nebulizer solution 3 mL     Date Action Dose Route User   05/07/2021 1646 Given 3 mL Nebulization Mabe, Beatris Ship, CMA      methylPREDNISolone acetate (DEPO-MEDROL) injection 120 mg     Date Action Dose Route User   05/07/2021 1648 Given 120 mg Intramuscular (Left Upper Outer Quadrant) Mabe, Beatris Ship, CMA       No flowsheet data found.  No results found for: NITRICOXIDE      Assessment & Plan:   COPD with acute exacerbation ([place]) Related to lobular pneumonia. Continued to experience a high symptom burden prior to this flare. Acutely ill appearing but non-toxic and in no acute distress. No increasing oxygen requirements. Step up therapy to triple with Trelegy inhaler. Samples provided today and rx sent. Pt now has insurance coverage. PFTs scheduled for later this month with follow up with Dr. Lamonte Sakai to follow.   Patient Instructions  -Stop your AirDuo inhaler -Start Trelegy 100 inhaler 1 puff daily  -Duoneb nebulizer treatment Twice daily until symptoms improve then as needed every 6 hours for shortness of breath or wheezing  -Referral to DME company for nebulizer machine -Albuterol inhaler 2 puffs every 6 hours as needed for shortness of breath or wheezing -Tessalon perles 1 capsule Three times a day for cough -Continue supplemental home oxygen of 2L/min. O2 saturation goal >88-90%. Notify if increasing requirements.   -Augmentin Twice daily for 10 days. Notify immediately of any rash, itching, hives, or swelling, or seek emergency care. Finish your antibiotics in their entirety. Do not stop just because symptoms improve. Take with food to reduce GI upset.  -Prednisone taper pack. Start tomorrow.  4 tabs for 3 days, then 3 tabs for 3 days, 2 tabs for 3 days,  then 1 tab for 3 days, then stop  -Ondansetron 4 mg every 8 hours as needed for nausea/vomiting   -Flonase nasal spray 2 sprays each nostril daily until symptoms improve then 1 spray daily -Mucinex DM over the counter Twice daily  -Saline nasal spray 2-3 times a day -Saline nasal gel in each nostril at night -Chlortab 4 mg over the counter at bedtime for cough  Depo steroid shot today in clinic Duoneb nebulizer treatment today in clinic  Chest CT today for further evaluation  PFTs scheduled for 12/27  Referral to dentist for further management of your dental decay.  Follow up in one with with Dr. Lamonte Sakai, Roxan Diesel, NP or APP. If symptoms do not improve or worsen, please contact office for sooner follow up or seek emergency care.   Lobular pneumonia Increased patchy bilateral mid to lower lung opacities; unable to exclude malignancy. Concerned for this given smoking history, recurrent pneumonias, worsening symptoms and anorexia. CT chest w/o contrast for further evaluation. Will discuss next steps with Dr Lamonte Sakai. Augmentin chosen over levaquin due to current tx with Zyprexa and risk for QT prolongation. Depo 120 IM and duoneb administered today. See above plan. Close follow up.  Chronic diastolic CHF (congestive heart failure) ([facility]) No fluid overload upon exam. Symptoms consistent with pulmonary etiology. See above plan.  Chronic respiratory failure with hypoxia ([facility]) No increasing requirements. Educated to monitor SpO2 at home for goal >88-90%. See above plan.  Acute rhinosinusitis Supportive care. See above plan.  Dental decay Possibly contributing to recurrent pneumonias. Referral to dentistry. See above plan.  Tobacco abuse Still smoking. Discussed need to stop and importance of quitting.   Nausea Likely related to postnasal drip and paroxysmal coughing. See above plan.     Clayton Bibles,  NP 05/07/2021  Pt aware and understands NP's role.

## 2021-05-07 NOTE — Assessment & Plan Note (Signed)
Supportive care. See above plan.

## 2021-05-07 NOTE — Assessment & Plan Note (Signed)
Still smoking. Discussed need to stop and importance of quitting.

## 2021-05-10 ENCOUNTER — Telehealth: Payer: Self-pay | Admitting: Nurse Practitioner

## 2021-05-10 MED ORDER — TRELEGY ELLIPTA 100-62.5-25 MCG/ACT IN AEPB: 1.0000 | INHALATION_SPRAY | Freq: Every day | RESPIRATORY_TRACT | 0 refills | Status: DC

## 2021-05-10 NOTE — Addendum Note (Signed)
Addended by: Dessie Coma on: 05/10/2021 11:37 AM   Modules accepted: Orders

## 2021-05-11 NOTE — Telephone Encounter (Signed)
This order was placed after 5 PM on 05/07/21 & sent to [facility] by Judeen Hammans on 05/10/21.  Danielle w/ [facility] confirmed receipt of the order this morning.

## 2021-05-12 ENCOUNTER — Ambulatory Visit: Payer: Medicaid Other | Admitting: Nurse Practitioner

## 2021-05-12 ENCOUNTER — Telehealth: Payer: Self-pay | Admitting: Nurse Practitioner

## 2021-05-12 NOTE — Progress Notes (Incomplete)
@Patient  ID: Adriana Hall, female    DOB: 1971-05-31, 50 y.o.   MRN: [id number]  No chief complaint on file.   Referring provider: No ref. provider found  HPI: 50 year old female, current smoker (60 pack year history) followed for COPD, chronic respiratory failure with hypoxia on home oxygen, and tobacco abuse. She is a patient of Dr. Agustina Caroli and last seen by Belenda Cruise, NP in office on 05/07/2021.  Previously hospitalized in 11/2020 for multifocal pneumonia and acute respiratory failure past medical history significant for hypertension, CAD, diastolic CHF, AV regurgitation, GERD, hypertriglyceridemia, anxiety, alcohol use disorder, bipolar.  TEST/EVENTS:  11/2020 CTA chest: Diffuse patchy groundglass airspace opacities with loculated small right pleural effusion.  Questionable pulmonary/pleural mass along the right middle lobe.  Associated right hilar and mediastinal lymphadenopathy.  Findings concerning for malignancy with superimposed infection.  Pulmonary hypertension.  Asymmetric pleural thickening of involving only the right hemothorax.  GGO in both lungs, somewhat sparing in the bases, but explained that the pleural.  Some areas of traction bronchiectasis in upper lobes, but no obvious fibrosis. 11/16/2020 pleural fluid: no malignant cells 11/18/2020 echocardiogram: LVEF 60 to 65%, G1 DD, normal RV size and function, normal RA and LA.  IVC normal.  Trivial MR otherwise normal values. 01/14/2021 CXR 2 view: Bibasilar pulmonary infiltrates again identified.  Similar to previous exam.  Upper lungs clear.  No pleural effusion or pneumothorax. 05/07/2021 CXR 2 view: Interval increase in patchy bilateral mid to lower lung zone of airspace opacities.  Persistent increased interstitial markings.  No pleural effusion no pneumothorax.  Malignancy not excluded recommended further imaging. 05/07/2021 CT chest without contrast: Mild cardiomegaly.  Atherosclerotic vascular disease.  Right paratracheal node 1.2 cm,  previously 1.5 cm.  Prevascular node 0.7 cm, previously the same.  Subcarinal node 1.7 cm, previously 2 cm.  Paraseptal emphysema.  Peripheral right subpleural mass 4.1 x 3.3 x 3.2 cm, although previous masslike appearance in this region was larger at 8.2 x 1 5.1 x 4.5 cm.  Somewhat coarse peripheral interstitial attenuation is present in both lungs.  2 x 3 mm left apical nodule.  8 x 4 mm left lower lobe nodule.  Cystic and varicoid bronchiectasis in the left lower lobe, mildly worse from prior.  Hazy peripheral groundglass opacities in the lungs, substantially better from prior.  Mild tree-in-bud nodularity in the right lower lobe.  Chronic consolidation or potential benign etiologies such as rounded atelectasis possible, malignancy not excluded particularly in light of risk factors.  PET/CT and/or biopsy advised.  11/13/2018 - 11/21/2020: Hospitalization for acute hypoxic respiratory failure secondary to bilateral multifocal pneumonia.  Treated with IV antibiotics and steroids.  PCCM consulted.  Discharged on oxygen  01/13/2021: OV with Eric Form, NP for hospital follow-up.  Continued 2 L supplemental nasal cannula.  Ordered PFTs, which were never completed.  Rx for air duo and albuterol rescue inhaler.  Z-Pak and prednisone taper.  Advised follow-up in 1 month with repeat chest CT, never completed.  05/07/2021: OV with Roxan Diesel, NP.  Worsening shortness of breath, productive cough, fevers.  Occasional nosebleeds.  No hemoptysis.  Chest tightness and wheezing with mild orthopnea.  COVID and flu negative.  CXR showed increased patchy bilateral mid to lower lung opacities.  Reported AirDuo did not work as well as previous Combivent inhaler. Step up therapy to Trelegy. Depo inj. Tx with augmentin x 10 days over levaquin due to risk for QT syndrome and prednisone 3 day taper pack. Supportive care.  Zofran for nausea. CXR. PFTs scheduled. CT chest (see above read). Referral to dentistry for decaying teeth.    Allergies  Allergen Reactions   Biotin     'Makes me extremely sick'   Doxycycline Nausea And Vomiting    Immunization History  Administered Date(s) Administered   Pneumococcal Polysaccharide-23 10/22/2013, 12/28/2019    Past Medical History:  Diagnosis Date   Anxiety    10 years   Arrhythmia    5 years   Bipolar 1 disorder, depressed ([place])    Bronchitis    Colitis 03-01-2011   Colonoscopy   Depression    10 years   Headache(784.0)    Chronic for 30 yrs   Hemorrhoids 03-01-2011   Colonoscopy    Hypertension     Tobacco History: Social History   Tobacco Use  Smoking Status Every Day   Packs/day: 2.00   Years: 30.00   Pack years: 60.00   Types: Cigarettes  Smokeless Tobacco Never  Tobacco Comments   As of 11/16/20: reports she is using patches this hospitalization  and does not want to resume cigarette use - report 2 ppd for 30+ years   information given on 03-01-11 and 09/10/13, has smoked 1.5 ppd for 20 years   Smoking less than 1/2 ppd as of 05/07/2021   Ready to quit: Not Answered Counseling given: Not Answered Tobacco comments: As of 11/16/20: reports she is using patches this hospitalization  and does not want to resume cigarette use - report 2 ppd for 30+ years information given on 03-01-11 and 09/10/13, has smoked 1.5 ppd for 20 years Smoking less than 1/2 ppd as of 05/07/2021   Outpatient Medications Prior to Visit  Medication Sig Dispense Refill   acetaminophen (TYLENOL) 325 MG tablet Take 2 tablets (650 mg total) by mouth every 6 (six) hours as needed for mild pain (or Fever >/= 101). 30 tablet 0   albuterol (VENTOLIN HFA) 108 (90 Base) MCG/ACT inhaler Inhale 2 puffs into the lungs every 6 (six) hours as needed for wheezing or shortness of breath. 8 g 2   amoxicillin-clavulanate (AUGMENTIN) 875-125 MG tablet Take 1 tablet by mouth 2 (two) times daily for 10 days. 20 tablet 0   benzonatate (TESSALON) 100 MG capsule Take 1 capsule (100 mg total) by mouth 3  (three) times daily as needed for cough. 30 capsule 1   buprenorphine-naloxone (SUBOXONE) 8-2 mg SUBL SL tablet Place 1 tablet under the tongue in the morning and at bedtime.     diphenhydrAMINE (BENADRYL) 25 MG tablet Take 50 mg by mouth at bedtime.     ferrous sulfate 325 (65 FE) MG tablet Take 1 tablet (325 mg total) by mouth daily with breakfast. 30 tablet 0   fluticasone (FLONASE) 50 MCG/ACT nasal spray Place 1 spray into both nostrils daily. 18.2 mL 2   Fluticasone-Umeclidin-Vilant (TRELEGY ELLIPTA) 100-62.5-25 MCG/ACT AEPB Inhale 100 mcg into the lungs daily. 1 each 3   Fluticasone-Umeclidin-Vilant (TRELEGY ELLIPTA) 100-62.5-25 MCG/ACT AEPB Inhale 1 puff into the lungs daily. 60 each 0   folic acid (FOLVITE) 1 MG tablet Take 1 tablet (1 mg total) by mouth daily. 30 tablet 0   gabapentin (NEURONTIN) 400 MG capsule TAKE 1 CAPSULE (400 MG TOTAL) BY MOUTH 3 (THREE) TIMES DAILY. 90 capsule 1   hydrOXYzine (ATARAX/VISTARIL) 25 MG tablet Take 1 tablet (25 mg total) by mouth 3 (three) times daily as needed for anxiety. 90 tablet 1   ibuprofen (ADVIL) 200 MG tablet Take 400 mg  by mouth every 6 (six) hours as needed for fever or moderate pain.     ipratropium-albuterol (DUONEB) 0.5-2.5 (3) MG/3ML SOLN Take 3 mLs by nebulization every 6 (six) hours as needed. 360 mL 12   nicotine (NICODERM CQ - DOSED IN MG/24 HOURS) 21 mg/24hr patch Place 1 patch (21 mg total) onto the skin daily. (Patient not taking: Reported on 01/13/2021) 28 patch 0   nitroGLYCERIN (NITROLINGUAL) 0.4 MG/SPRAY spray Place 1 spray under the tongue once as needed for chest pain. (Patient not taking: Reported on 05/07/2021) 12 g 0   Nutritional Supplements (ESTROVEN PO) Take 1 tablet by mouth daily.     OLANZapine (ZYPREXA) 10 MG tablet Take 1 tablet (10 mg total) by mouth at bedtime. 30 tablet 1   omeprazole (PRILOSEC) 40 MG capsule Take 40 mg by mouth daily.     ondansetron (ZOFRAN) 4 MG tablet Take 1 tablet (4 mg total) by mouth every 8  (eight) hours as needed for nausea or vomiting. 30 tablet 0   predniSONE (DELTASONE) 10 MG tablet 4 tabs for 3 days, then 3 tabs for 3 days, 2 tabs for 3 days, then 1 tab for 3 days, then stop 20 tablet 0   traZODone (DESYREL) 100 MG tablet TAKE 2 TABLETS (200 MG TOTAL) BY MOUTH AT BEDTIME AS NEEDED FOR SLEEP. 60 tablet 1   venlafaxine (EFFEXOR) 37.5 MG tablet Take 37.5 mg by mouth daily.     vitamin B-12 100 MCG tablet Take 1 tablet (100 mcg total) by mouth daily. 30 tablet 0   Facility-Administered Medications Prior to Visit  Medication Dose Route Frequency Provider Last Rate Last Admin   ipratropium-albuterol (DUONEB) 0.5-2.5 (3) MG/3ML nebulizer solution 3 mL  3 mL Nebulization Once Mirabella Hilario, Karie Schwalbe, NP       methylPREDNISolone acetate (DEPO-MEDROL) injection 120 mg  120 mg Intramuscular Once Christle Nolting, Karie Schwalbe, NP         Review of Systems:   Constitutional: No weight loss or gain, night sweats, fevers, chills, fatigue, or lassitude. HEENT: No headaches, difficulty swallowing, tooth/dental problems, or sore throat. No sneezing, itching, ear ache, nasal congestion, or post nasal drip CV:  No chest pain, orthopnea, PND, swelling in lower extremities, anasarca, dizziness, palpitations, syncope Resp: No shortness of breath with exertion or at rest. No excess mucus or change in color of mucus. No productive or non-productive. No hemoptysis. No wheezing.  No chest wall deformity GI:  No heartburn, indigestion, abdominal pain, nausea, vomiting, diarrhea, change in bowel habits, loss of appetite, bloody stools.  GU: No dysuria, change in color of urine, urgency or frequency.  No flank pain, no hematuria  Skin: No rash, lesions, ulcerations MSK:  No joint pain or swelling.  No decreased range of motion.  No back pain. Neuro: No dizziness or lightheadedness.  Psych: No depression or anxiety. Mood stable.     Physical Exam:  There were no vitals taken for this visit.  GEN: Pleasant,  interactive, well-nourished/chronically-ill appearing/acutely-ill appearing/poorly-nourished/morbidly obese; in no acute distress.****** HEENT:  Normocephalic and atraumatic. EACs patent bilaterally. TM pearly gray with present light reflex bilaterally. PERRLA. Sclera white. Nasal turbinates pink, moist and patent bilaterally. No rhinorrhea present. Oropharynx pink and moist, without exudate or edema. No lesions, ulcerations, or postnasal drip.  NECK:  Supple w/ fair ROM. No JVD present. Normal carotid impulses w/o bruits. Thyroid symmetrical with no goiter or nodules palpated. No lymphadenopathy.   CV: RRR, no m/r/g, no peripheral edema. Pulses intact, +2  bilaterally. No cyanosis, pallor or clubbing. PULMONARY:  Unlabored, regular breathing. Clear bilaterally A&P w/o wheezes/rales/rhonchi. No accessory muscle use. No dullness to percussion. GI: BS present and normoactive. Soft, non-tender to palpation. No organomegaly or masses detected. No CVA tenderness. MSK: No erythema, warmth or tenderness. Cap refil <2 sec all extrem. No deformities or joint swelling noted.  Neuro: A/Ox3. No focal deficits noted.   Skin: Warm, no lesions or rashe Psych: Normal affect and behavior. Judgement and thought content appropriate.     Lab Results:  CBC    Component Value Date/Time   WBC 13.8 (H) 11/21/2020 0111   RBC 5.16 (H) 11/21/2020 0111   HGB 11.0 (L) 11/21/2020 0111   HCT 38.3 11/21/2020 0111   PLT 490 (H) 11/21/2020 0111   MCV 74.2 (L) 11/21/2020 0111   MCH 21.3 (L) 11/21/2020 0111   MCHC 28.7 (L) 11/21/2020 0111   RDW 24.9 (H) 11/21/2020 0111   LYMPHSABS 2.7 11/21/2020 0111   MONOABS 1.0 11/21/2020 0111   EOSABS 0.3 11/21/2020 0111   BASOSABS 0.1 11/21/2020 0111    BMET    Component Value Date/Time   NA 136 11/21/2020 0111   K 3.9 11/21/2020 0111   CL 96 (L) 11/21/2020 0111   CO2 32 11/21/2020 0111   GLUCOSE 99 11/21/2020 0111   BUN 9 11/21/2020 0111   CREATININE 0.59 11/21/2020 0111    CREATININE 0.75 08/31/2015 1259   CALCIUM 9.3 11/21/2020 0111   GFRNONAA >60 11/21/2020 0111   GFRNONAA >89 08/31/2015 1259   GFRAA >60 12/26/2019 1645   GFRAA >89 08/31/2015 1259    BNP    Component Value Date/Time   BNP 60.3 11/20/2020 0120     Imaging:  DG Chest 2 View  Result Date: 05/07/2021 CLINICAL DATA:  Cough, low-grade fever EXAM: CHEST - 2 VIEW COMPARISON:  Chest x-ray 01/13/2021, chest x-ray 11/20/2020, CT angio chest 11/12/2020 FINDINGS: The heart and mediastinal contours are within normal limits. Interval increase in patchy bilateral mid to lower lung zone airspace opacities. Persistent increased interstitial markings. No pleural effusion. No pneumothorax. No acute osseous abnormality. IMPRESSION: Interval increase in patchy bilateral mid to lower lung zone airspace opacities. Malignancy not excluded. Recommend CT chest with intravenous contrast (not CTPA) for further evaluation. Electronically Signed   By: Iven Finn M.D.   On: 05/07/2021 15:40   CT Chest Wo Contrast  Result Date: 05/07/2021 CLINICAL DATA:  Shortness of breath. Cough. Progressive opacities in the lungs on recent radiography. EXAM: CT CHEST WITHOUT CONTRAST TECHNIQUE: Multidetector CT imaging of the chest was performed following the standard protocol without IV contrast. COMPARISON:  Radiographs 05/07/2021 and CT scan 11/12/2020 FINDINGS: Cardiovascular: Aortic and branch vessel atherosclerotic vascular disease. Mild cardiomegaly. Mediastinum/Nodes: Right paratracheal node 1.2 cm in short axis on image 41 series 2, previously 1.5 cm. Prevascular node 0.7 cm in short axis on image 48 series 2, previously the same. Subcarinal node 1.7 cm in short axis on image 61 series 2, previously 2.0 cm by my measurements. Lungs/Pleura: Paraseptal emphysema. Peripheral right subpleural mass 4.1 by 3.3 by 3.2 cm, although previous masslike appearance in this region was larger at 8.2 by 5.1 by 4.5 cm. Somewhat coarse  peripheral interstitial accentuation is present in both lungs. 2 by 3 mm left apical nodule on image 18 series 5. 8 by 4 mm left lower lobe nodule on image 95 series 5, previously obscured in a band of atelectasis. Cystic and varicoid bronchiectasis in the left lower lobe for  example on image 66 series 5, mildly worsened from prior. There is some hazy peripheral ground-glass opacities in the lungs although substantially better than on 11/12/2020. There is some mild tree-in-bud nodularity in the right lower lobe peripherally for example on image 105 series 5 probably from atypical infectious bronchiolitis, this region was more obscured by peripheral atelectasis on the 11/12/2020 exam. The previous small right pleural effusion has intervally resolved. Many of the scattered airspace opacities shown on the prior CT exam have resolved. Upper Abdomen: Unremarkable Musculoskeletal: Unremarkable IMPRESSION: 1. Right middle lobe 4.1 by 3.3 by 3.2 cm subpleural mass, substantially reduced in size compared to the appearance on 11/12/2020. While a chronic consolidation or potential benign etiologies such as rounded atelectasis might be raised, malignancy is certainly not excluded particularly in light of the patient's risk factors, and nuclear medicine PET-CT should be considered. Percutaneous or bronchoscopic biopsy might also be feasible. 2. Mild paratracheal and subcarinal adenopathy, although both somewhat improved from 11/12/2020. 3. Paraseptal emphysema with coarse peripheral interstitial accentuation, localized left lower lobe bronchiectasis, and some hazy peripheral ground-glass opacities in the lungs although in general the opacities in the lungs are substantially improved compared to 11/12/2020. The previous right pleural effusion has resolved. 4. Several small nodules merit surveillance including an 8 by 4 mm left lower lobe nodule. There is some tree-in-bud nodularity in the right lower lobe probably from atypical  infectious bronchiolitis given the morphology. 5. Aortic and branch vessel atherosclerosis with mild cardiomegaly. Electronically Signed   By: Van Clines M.D.   On: 05/07/2021 19:04    ipratropium-albuterol (DUONEB) 0.5-2.5 (3) MG/3ML nebulizer solution 3 mL     Date Action Dose Route User   05/07/2021 1646 Given 3 mL Nebulization Mabe, Beatris Ship, CMA      methylPREDNISolone acetate (DEPO-MEDROL) injection 120 mg     Date Action Dose Route User   05/07/2021 1648 Given 120 mg Intramuscular (Left Upper Outer Quadrant) Mabe, Beatris Ship, CMA       No flowsheet data found.  No results found for: NITRICOXIDE      Assessment & Plan:   No problem-specific Assessment & Plan notes found for this encounter.     Clayton Bibles, NP 05/12/2021  Pt aware and understands NP's role.

## 2021-05-12 NOTE — Telephone Encounter (Signed)
I called to check on the patient per Roxan Diesel NP recommendations since the patient missed her OV and the patient reports that she is doing better. She reports she had forgot her  appointment and she is aware to keep the follow up in the office with Dr. Lamonte Sakai at the end of the month. She was also advised to follow up in the office if she needed [place] before then and she voices understanding.

## 2021-05-26 ENCOUNTER — Other Ambulatory Visit: Payer: Self-pay

## 2021-05-27 ENCOUNTER — Telehealth: Payer: Self-pay | Admitting: Nurse Practitioner

## 2021-05-27 ENCOUNTER — Telehealth (HOSPITAL_COMMUNITY): Payer: Self-pay

## 2021-05-27 NOTE — Telephone Encounter (Signed)
Patient called requesting a refill on her Fluvoxamine (Luvox) 100mg . Writer didn't see it in her current medications

## 2021-05-27 NOTE — Telephone Encounter (Signed)
error 

## 2021-05-27 NOTE — Telephone Encounter (Signed)
Patient called requesting a refill on her Fluvoxamine (Luvox) 100mg . Writer didn't see it in her current medications so Probation officer called the pharmacy. The pharmacist at [facility] on [address] in [place] stated that the medication was Discontinued on 12/2 by a Ron Agee, RN. Pharmacist stated that the reason was for Change in Therapy. Please review and advise. Thank you

## 2021-05-27 NOTE — Telephone Encounter (Signed)
Called [facility] and the fax # is [phone].  Last OV note has been faxed per request.  She also has requested that the CMN be signed and sent back as well.  I will send this message over to CB to see about the CMN.

## 2021-05-27 NOTE — Telephone Encounter (Signed)
Called [facility] to let them know that we had not received a CMN on pt and they said that they noticed that the CMN did not go through.  [facility] stated that they would refax it to our office. Will keep encounter open to await CMN.

## 2021-05-27 NOTE — Telephone Encounter (Signed)
I have not got a CMN on this patient

## 2021-06-01 ENCOUNTER — Ambulatory Visit: Payer: Medicaid Other | Admitting: Emergency Medicine

## 2021-06-01 NOTE — Telephone Encounter (Signed)
Received message back from [facility]) that we have not received a CMN on this patient.    Called and spoke with Leroy Sea ([facility]), states it was faxed, however, will fax it directly to me.  Fax received and fax was already signed by Marland Kitchen NP. Called and spoke with [facility] with [facility], it looks like it was sent to be signed and we faxed it back to [facility] after being signed.  It appears to have been handled.  Nothing further needed.

## 2021-06-01 NOTE — Telephone Encounter (Signed)
Message sent to [facility] to see if the CMN was received from [facility].

## 2021-06-02 ENCOUNTER — Other Ambulatory Visit (HOSPITAL_COMMUNITY): Payer: Self-pay | Admitting: Physician Assistant

## 2021-06-02 DIAGNOSIS — F3181 Bipolar II disorder: Secondary | ICD-10-CM

## 2021-06-02 MED ORDER — FLUVOXAMINE MALEATE 50 MG PO TABS: ORAL_TABLET | ORAL | 0 refills | Status: DC

## 2021-06-02 NOTE — Telephone Encounter (Signed)
Provider was contacted by Latina Craver, regarding patient's medication refill request.  Provider attempted to contact patient but was unable to.  Provider left messages detailing that patient medication to be sent to pharmacy of choice.

## 2021-06-02 NOTE — Progress Notes (Signed)
Provider was contacted by Latina Craver, regarding patient's medication refill request.  Provider attempted to contact patient but was unable to.  Provider left messages detailing that patient medication to be sent to pharmacy of choice.  Patient to take fluvoxamine 50 mg at bedtime for 6 days then continue taking on 100 mg at bedtime.

## 2021-06-02 NOTE — Telephone Encounter (Signed)
Writer spoke with provider and he stated that he will address the message writer sent him with the patient at her appointment on 06/04/21

## 2021-06-04 ENCOUNTER — Telehealth (INDEPENDENT_AMBULATORY_CARE_PROVIDER_SITE_OTHER): Payer: Medicaid Other | Admitting: Physician Assistant

## 2021-06-04 ENCOUNTER — Encounter (HOSPITAL_COMMUNITY): Payer: Self-pay | Admitting: Physician Assistant

## 2021-06-04 DIAGNOSIS — F3181 Bipolar II disorder: Secondary | ICD-10-CM

## 2021-06-04 DIAGNOSIS — F419 Anxiety disorder, unspecified: Secondary | ICD-10-CM

## 2021-06-04 MED ORDER — TRAZODONE HCL 100 MG PO TABS: ORAL_TABLET | ORAL | 1 refills | Status: DC

## 2021-06-04 MED ORDER — GABAPENTIN 400 MG PO CAPS: ORAL_CAPSULE | ORAL | 1 refills | Status: DC

## 2021-06-04 MED ORDER — OLANZAPINE 10 MG PO TABS: 10.0000 mg | ORAL_TABLET | Freq: Every day | ORAL | 1 refills | Status: DC

## 2021-06-04 MED ORDER — HYDROXYZINE HCL 25 MG PO TABS: 25.0000 mg | ORAL_TABLET | Freq: Three times a day (TID) | ORAL | 1 refills | Status: DC | PRN
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
## 2021-06-09 ENCOUNTER — Telehealth: Payer: Self-pay | Admitting: Emergency Medicine

## 2021-06-09 ENCOUNTER — Telehealth: Payer: Self-pay | Admitting: Nurse Practitioner

## 2021-06-09 DIAGNOSIS — J441 Chronic obstructive pulmonary disease with (acute) exacerbation: Secondary | ICD-10-CM

## 2021-06-09 NOTE — Telephone Encounter (Signed)
error 

## 2021-06-10 MED ORDER — TRELEGY ELLIPTA 100-62.5-25 MCG/ACT IN AEPB: 100.0000 ug | INHALATION_SPRAY | Freq: Every day | RESPIRATORY_TRACT | 3 refills | Status: DC

## 2021-06-10 NOTE — Telephone Encounter (Signed)
Called and spoke to pt. Pt states she needs a new script for Trelegy 100 as she could not pick up the first script that was sent in by Roxan Diesel, NP, due to insurance issues. New rx sent to preferred pharmacy. Pt verbalized understanding and denied any further questions or concerns at this time.

## 2021-06-11 ENCOUNTER — Other Ambulatory Visit (HOSPITAL_COMMUNITY): Payer: Self-pay

## 2021-06-11 ENCOUNTER — Telehealth: Payer: Self-pay | Admitting: Pharmacy Technician

## 2021-06-11 NOTE — Telephone Encounter (Signed)
Patient Advocate Encounter  Received notification from [facility]) that prior authorization for TRELEGY 100 is required.   PA submitted on 1.6.23 [place] [id number] W Status is pending   [facility] will continue to follow  Luciano Cutter, CPhT Patient Advocate Phone: [phone] Fax:  [phone]  May require step therapy, before it will approve: Advair [facility] Symbicort

## 2021-06-16 ENCOUNTER — Telehealth: Payer: Self-pay | Admitting: Emergency Medicine

## 2021-06-16 MED ORDER — TRELEGY ELLIPTA 100-62.5-25 MCG/ACT IN AEPB: 1.0000 | INHALATION_SPRAY | Freq: Every day | RESPIRATORY_TRACT | 0 refills | Status: DC

## 2021-06-16 NOTE — Telephone Encounter (Signed)
Received a fax regarding Prior Authorization from [facility] for [place] 100. Authorization has been DENIED because STEP THERAPY MEDICATION. PT MUST TRY/FAIL ADVAIR, DULERA, AND SYMBICORT.  Pt has not tried/fail any of those inhalers.

## 2021-06-16 NOTE — Telephone Encounter (Signed)
Call made to patient, confirmed DOB. Made aware sample has been placed up front and is available for pick up.   Nothing further needed at this time.

## 2021-06-23 ENCOUNTER — Telehealth: Payer: Self-pay | Admitting: Pulmonary Disease

## 2021-06-23 NOTE — Telephone Encounter (Signed)
Called [employer] and spoke with them about office notes the medical certificate. [employer] stated they have the office notes and re faxed the certificate that is required to [facility] fax machine.

## 2021-06-24 ENCOUNTER — Ambulatory Visit (INDEPENDENT_AMBULATORY_CARE_PROVIDER_SITE_OTHER): Payer: Medicaid Other | Admitting: Emergency Medicine

## 2021-06-24 ENCOUNTER — Other Ambulatory Visit: Payer: Self-pay

## 2021-06-24 DIAGNOSIS — J441 Chronic obstructive pulmonary disease with (acute) exacerbation: Secondary | ICD-10-CM

## 2021-06-24 LAB — PULMONARY FUNCTION TEST
DL/VA % pred: 80 %
DL/VA: 3.47 ml/min/mmHg/L
DLCO cor % pred: 67 %
DLCO cor: 13.79 ml/min/mmHg
DLCO unc % pred: 67 %
DLCO unc: 13.79 ml/min/mmHg
FEF 25-75 Post: 3.06 L/sec
FEF 25-75 Pre: 2.75 L/sec
FEF2575-%Change-Post: 11 %
FEF2575-%Pred-Post: 115 %
FEF2575-%Pred-Pre: 103 %
FEV1-%Change-Post: 1 %
FEV1-%Pred-Post: 73 %
FEV1-%Pred-Pre: 72 %
FEV1-Post: 2 L
FEV1-Pre: 1.96 L
FEV1FVC-%Change-Post: -1 %
FEV1FVC-%Pred-Pre: 111 %
FEV6-%Change-Post: 3 %
FEV6-%Pred-Post: 68 %
FEV6-%Pred-Pre: 66 %
FEV6-Post: 2.28 L
FEV6-Pre: 2.21 L
FEV6FVC-%Pred-Post: 102 %
FEV6FVC-%Pred-Pre: 102 %
FVC-%Change-Post: 3 %
FVC-%Pred-Post: 66 %
FVC-%Pred-Pre: 64 %
FVC-Post: 2.28 L
FVC-Pre: 2.21 L
Post FEV1/FVC ratio: 88 %
Post FEV6/FVC ratio: 100 %
Pre FEV1/FVC ratio: 89 %
Pre FEV6/FVC Ratio: 100 %
RV % pred: 97 %
RV: 1.75 L
TLC % pred: 79 %
TLC: 3.95 L

## 2021-06-24 NOTE — Progress Notes (Signed)
PFT done today. 

## 2021-06-24 NOTE — Telephone Encounter (Signed)
I have received the CMN and it has been given to [employer] to sign

## 2021-06-25 ENCOUNTER — Ambulatory Visit: Payer: Medicaid Other | Admitting: Nurse Practitioner

## 2021-06-28 ENCOUNTER — Encounter: Payer: Self-pay | Admitting: Primary Care

## 2021-06-28 ENCOUNTER — Other Ambulatory Visit: Payer: Self-pay

## 2021-06-28 ENCOUNTER — Ambulatory Visit (INDEPENDENT_AMBULATORY_CARE_PROVIDER_SITE_OTHER): Payer: Medicaid Other | Admitting: Primary Care

## 2021-06-28 VITALS — BP 142/82 | HR 81 | Temp 97.9°F | Wt 189.4 lb

## 2021-06-28 DIAGNOSIS — J181 Lobar pneumonia, unspecified organism: Secondary | ICD-10-CM | POA: Diagnosis not present

## 2021-06-28 DIAGNOSIS — J9611 Chronic respiratory failure with hypoxia: Secondary | ICD-10-CM | POA: Diagnosis not present

## 2021-06-28 DIAGNOSIS — R9389 Abnormal findings on diagnostic imaging of other specified body structures: Secondary | ICD-10-CM | POA: Insufficient documentation

## 2021-06-28 DIAGNOSIS — J411 Mucopurulent chronic bronchitis: Secondary | ICD-10-CM | POA: Diagnosis not present

## 2021-06-28 HISTORY — DX: Abnormal findings on diagnostic imaging of other specified body structures: R93.89

## 2021-06-28 MED ORDER — BREZTRI AEROSPHERE 160-9-4.8 MCG/ACT IN AERO: 2.0000 | INHALATION_SPRAY | Freq: Two times a day (BID) | RESPIRATORY_TRACT | 0 refills | Status: DC

## 2021-06-28 NOTE — Assessment & Plan Note (Signed)
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 Mild paratracheal and subcarinal adenopathy.There is some tree-in-bud nodularity in the right lower lobe probably from atypical infectious bronchiolitis given the morphology. - Given patient hx tobacco smoking and chronic cough I am concerned she could have an underlying malignancy vs chronic infection. Checking sputum samples and will discuss getting bronchoscopy vs PET imaging with Dr. Candida Peeling.

## 2021-06-28 NOTE — Patient Instructions (Addendum)
Recommendations: - Stop Trelegy - Start Breztri Aerosphere two puffs morning and evening (please give patient Patient assistance) - Start using flutter valve three times a day to help loosen congestion - Please provide sputum sample if able  - I will discuss your CT results with MD to see if they want to do bronchoscopy or continue with imaging   Orders: - Sputum culture (order) - Flutter valve   Follow-up: - First available with Dr. Lamonte Sakai (2-4 weeks)    Flexible Bronchoscopy Flexible bronchoscopy is a procedure used to examine the passageways in the lungs. During the procedure, a thin, flexible tool with a camera (bronchoscope) is passed into the mouth or nose, down through the windpipe (trachea), and into the air tubes in the lungs (bronchi). This tool allows the health care provider to look inside the lungs and to take samples for testing, if needed. Tell a health care provider about: Any allergies you have. All medicines you are taking, including vitamins, herbs, eye drops, creams, and over-the-counter medicines. Any problems you or family members have had with anesthetic medicines. Any blood disorders you have. Any surgeries you have had. Any medical conditions you have. Whether you are pregnant or may be pregnant. What are the risks? Generally, this is a safe procedure. However, problems may occur, including: Infection. Bleeding. Damage to other structures or organs. Allergic reactions to medicines. Collapsed lung (pneumothorax). Increased need for oxygen or difficulty breathing after the procedure. What happens before the procedure? Staying hydrated Follow instructions from your health care provider about hydration, which may include: Up to 2 hours before the procedure - you may continue to drink clear liquids, such as water, clear fruit juice, black coffee, and plain tea.  Eating and drinking restrictions Follow instructions from your health care provider about eating  and drinking, which may include: 8 hours before the procedure - stop eating heavy meals or foods, such as meat, fried foods, or fatty foods. 6 hours before the procedure - stop eating light meals or foods, such as toast or cereal. 6 hours before the procedure - stop drinking milk or drinks that contain milk. 2 hours before the procedure - stop drinking clear liquids. Medicines Ask your health care provider about: Changing or stopping your regular medicines. This is especially important if you are taking diabetes medicines or blood thinners. Taking medicines such as aspirin and ibuprofen. These medicines can thin your blood. Do not take these medicines unless your health care provider tells you to take them. Taking over-the-counter medicines, vitamins, herbs, and supplements. General instructions You may be given antibiotic medicine to help lower the risk of infection. Plan to have a responsible adult take you home from the hospital or clinic. If you will be going home right after the procedure, plan to have a responsible adult care for you for the time you are told. This is important. What happens during the procedure? An IV will be inserted into one of your veins. You will be given a medicine (local anesthetic) to numb your mouth, nose, throat, and voice box (larynx). You may also be given one or more of the following: A medicine to help you relax (sedative). A medicine to control coughing. A medicine to dry up any fluids or secretions in your lungs. A bronchoscope will be passed into your nose or mouth, and into your lungs. Your health care provider will examine your lungs. Samples of airway secretions may be collected for testing. If abnormal areas are seen in your airways,  samples of tissue may be removed and checked under a microscope (biopsy). If tissue samples are needed from the outer parts of the lung, a type of X-ray (fluoroscopy) may be used to guide the bronchoscope to these  areas. If bleeding occurs, you may be given medicine to stop or decrease the bleeding. The procedure may vary among health care providers and hospitals. What can I expect after the procedure? Your blood pressure, heart rate, breathing rate, and blood oxygen level will be monitored until you leave the hospital or clinic. You may have a chest X-ray to check for signs of pneumothorax. You willnot be allowed to eat or drink anything for 2 hours after your procedure. If a biopsy was taken, it is up to you to get the results of the test. Ask your health care provider, or the department that is doing the procedure, when your results will be ready. You may have the following symptoms for 24-48 hours: A cough that is worse than it was before the procedure. A low-grade fever. A sore throat or hoarse voice. Some blood in the mucus from your lungs (sputum), if a biopsy was done. Follow these instructions at home: Eating and drinking Do not eat or drink anything, including water, for 2 hours after your procedure, or until your numbing medicine has worn off. Having a numb throat increases your risk of burning yourself or choking. Start eating soft foods and slowly drinking liquids after your numbness is gone and your cough and gag reflexes have returned. You may return to your normal diet the day after the procedure. Driving If you were given a sedative during the procedure, it can affect you for several hours. Do not drive or operate machinery until your health care provider says that it is safe. Ask your health care provider if the medicine prescribed to you requires you to avoid driving or using machinery. Return to your normal activities as told by your health care provider. Ask your health care provider what activities are safe for you. General instructions  Take over-the-counter and prescription medicines only as told by your health care provider. Do not use any products that contain nicotine or  tobacco. These products include cigarettes, chewing tobacco, and vaping devices, such as e-cigarettes. If you need help quitting, ask your health care provider. Keep all follow-up visits. This is important. Get help right away if: You have shortness of breath that gets worse. You become light-headed or feel like you might faint. You have chest pain. You cough up more than a small amount of blood. These symptoms may represent a serious problem that is an emergency. Do not wait to see if the symptoms will go away. Get medical help right away. Call your local emergency services (911 in the [place] Do not drive yourself to the hospital. Summary Flexible bronchoscopy is a procedure that allows your health care provider to look closely inside your lungs and to take testing samples if needed. Risks of flexible bronchoscopy include bleeding, infection, and collapsed lung (pneumothorax). Before the procedure, you will be given a medicine to numb your mouth, nose, throat, and voice box. Then, a bronchoscope will be passed into your nose or mouth, and into your lungs. After the procedure, your blood pressure, heart rate, breathing rate, and blood oxygen level will be monitored until you leave the hospital or clinic. You may have a chest X-ray to check for signs of pneumothorax. You will not be allowed to eat or drink anything for 2  hours after your procedure. This information is not intended to replace advice given to you by your health care provider. Make sure you discuss any questions you have with your health care provider. Document Revised: 02/03/2021 Document Reviewed: 12/12/2019 Elsevier Patient Education  [place].

## 2021-06-28 NOTE — Telephone Encounter (Signed)
CMN signed and faxed back.

## 2021-06-28 NOTE — Progress Notes (Addendum)
@Patient  ID: Adriana Hall, female    DOB: [birth year], 51 y.o.   MRN: [id number]  Chief Complaint  Patient presents with   Follow-up    Follow up. Pt is unsure of her dx with copd or not. She saw Dr Lamonte Sakai in the hospital. While in hospital they found a spot in lungs and that it was bigger then before. Got an xray done here last month. Pt states her breathing is not good.     Referring provider: No ref. provider found  HPI: 51 year old female, current every day smoker (60 pack year hx). PMH significant for HTN, CAD, AV regurgitation, diastolic heart failure, COPD, chronic respiratory failure, tobacco abuse. She saw both Dr. Lamonte Sakai and Dr. Erin Fulling during an in-patient stay back in August 2022 for lobar pneumonia. Patient seen last by Roxan Diesel. Pulmonary function testing in January 2023 showed moderate restriction without BD response.    06/28/2021 Patient presents today for follow-up. She has canceled/no-showed several apt with Dr. Lamonte Sakai. She was seen on in December 2022 by NP where she was treated for acute exacerbation COPD with zpack and prednisone taper. She was also given samples of trelegy 126mcg one puff daily. She completed PFTs in January 2023 which showed moderate restriction without BD response. Feels Trelegy helped her breathing but does inhaler does not last all day. Still wheezing. She needs to use her albuterol rescue inhaler twice in the evening. She still has cough with dark brown/benign mucus. She took mucinex for 3 weeks without improvement in cough. She continues to smoke 1ppd. No hemoptysis or substantial weight loss.    TEST/EVENTS:  11/2020 CT chest: Diffuse patchy groundglass airspace opacities with loculated small right pleural effusion.  Questionable pulmonary/pleural mass along the right middle lobe.  Associated right hilar and mediastinal lymphadenopathy.  Findings concerning for malignancy with superimposed infection.  Pulmonary hypertension.  Asymmetric pleural  thickening of involving only the right hemothorax.  GGO in both lungs, somewhat sparing the bases, but extending to the pleural.  Some areas of traction bronchiectasis in upper lobes, but no obvious fibrosis. 11/16/2020 pleural fluid: No malignant cells 11/18/2020 echocardiogram: LV EF 60-65%, G1 DD, normal RV size and function.  Normal RA and LA.  IVC normal size and variability.  Trivial MR otherwise normal values. 01/14/2021 CXR 2 view: Bibasilar pulmonary infiltrates again identified.  Similar to previous exam.  Upper lungs clear.  No pleural effusion or pneumothorax  Allergies  Allergen Reactions   Biotin     'Makes me extremely sick'   Doxycycline Nausea And Vomiting    Immunization History  Administered Date(s) Administered   Influenza-Unspecified 05/15/2021   Pneumococcal Polysaccharide-23 10/22/2013, 12/28/2019    Past Medical History:  Diagnosis Date   Anxiety    10 years   Arrhythmia    5 years   Bipolar 1 disorder, depressed ([place])    Bronchitis    Colitis 03-01-2011   Colonoscopy   Depression    10 years   Headache(784.0)    Chronic for 30 yrs   Hemorrhoids 03-01-2011   Colonoscopy    Hypertension     Tobacco History: Social History   Tobacco Use  Smoking Status Every Day   Packs/day: 1.00   Years: 30.00   Pack years: 30.00   Types: Cigarettes  Smokeless Tobacco Never  Tobacco Comments   As of 11/16/20: reports she is using patches this hospitalization  and does not want to resume cigarette use - report 2 ppd for  30+ years   information given on 03-01-11 and 09/10/13, has smoked 1.5 ppd for 20 years   Smoking less than 1/2 ppd as of 05/07/2021   Ready to quit: Not Answered Counseling given: Not Answered Tobacco comments: As of 11/16/20: reports she is using patches this hospitalization  and does not want to resume cigarette use - report 2 ppd for 30+ years information given on 03-01-11 and 09/10/13, has smoked 1.5 ppd for 20 years Smoking less than 1/2 ppd as of  05/07/2021   Outpatient Medications Prior to Visit  Medication Sig Dispense Refill   acetaminophen (TYLENOL) 325 MG tablet Take 2 tablets (650 mg total) by mouth every 6 (six) hours as needed for mild pain (or Fever >/= 101). 30 tablet 0   albuterol (VENTOLIN HFA) 108 (90 Base) MCG/ACT inhaler Inhale 2 puffs into the lungs every 6 (six) hours as needed for wheezing or shortness of breath. 8 g 2   ALPRAZolam (XANAX) 0.5 MG tablet Take 0.5 mg by mouth as needed.     benzonatate (TESSALON) 100 MG capsule Take 1 capsule (100 mg total) by mouth 3 (three) times daily as needed for cough. 30 capsule 1   buprenorphine-naloxone (SUBOXONE) 8-2 mg SUBL SL tablet Place 1 tablet under the tongue in the morning and at bedtime.     diphenhydrAMINE (BENADRYL) 25 MG tablet Take 50 mg by mouth at bedtime.     ferrous sulfate 325 (65 FE) MG tablet Take 1 tablet (325 mg total) by mouth daily with breakfast. 30 tablet 0   fluticasone (FLONASE) 50 MCG/ACT nasal spray Place 1 spray into both nostrils daily. 18.2 mL 2   Fluticasone-Umeclidin-Vilant (TRELEGY ELLIPTA) 100-62.5-25 MCG/ACT AEPB Inhale 100 mcg into the lungs daily. 1 each 3   Fluticasone-Umeclidin-Vilant (TRELEGY ELLIPTA) 100-62.5-25 MCG/ACT AEPB Inhale 1 puff into the lungs daily. 60 each 0   fluvoxaMINE (LUVOX) 50 MG tablet Patient to take 1 tablet (50 mg total) for 6 days at bedtime, then continue taking 2 tablets (100 mg total) at bedtime 30 tablet 0   folic acid (FOLVITE) 1 MG tablet Take 1 tablet (1 mg total) by mouth daily. 30 tablet 0   gabapentin (NEURONTIN) 400 MG capsule TAKE 1 CAPSULE (400 MG TOTAL) BY MOUTH 3 (THREE) TIMES DAILY. 90 capsule 1   hydrOXYzine (ATARAX) 25 MG tablet Take 1 tablet (25 mg total) by mouth 3 (three) times daily as needed for anxiety. 90 tablet 1   hydrOXYzine (VISTARIL) 25 MG capsule Take 25 mg by mouth 3 (three) times daily as needed.     ibuprofen (ADVIL) 200 MG tablet Take 400 mg by mouth every 6 (six) hours as needed  for fever or moderate pain.     ipratropium-albuterol (DUONEB) 0.5-2.5 (3) MG/3ML SOLN Take 3 mLs by nebulization every 6 (six) hours as needed. 360 mL 12   nicotine (NICODERM CQ - DOSED IN MG/24 HOURS) 21 mg/24hr patch Place 1 patch (21 mg total) onto the skin daily. 28 patch 0   nitroGLYCERIN (NITROLINGUAL) 0.4 MG/SPRAY spray Place 1 spray under the tongue once as needed for chest pain. 12 g 0   Nutritional Supplements (ESTROVEN PO) Take 1 tablet by mouth daily.     OLANZapine (ZYPREXA) 10 MG tablet Take 1 tablet (10 mg total) by mouth at bedtime. 30 tablet 1   omeprazole (PRILOSEC) 40 MG capsule Take 40 mg by mouth daily.     ondansetron (ZOFRAN) 4 MG tablet Take 1 tablet (4 mg total) by  mouth every 8 (eight) hours as needed for nausea or vomiting. 30 tablet 0   predniSONE (DELTASONE) 10 MG tablet 4 tabs for 3 days, then 3 tabs for 3 days, 2 tabs for 3 days, then 1 tab for 3 days, then stop 20 tablet 0   traZODone (DESYREL) 100 MG tablet TAKE 2 TABLETS (200 MG TOTAL) BY MOUTH AT BEDTIME AS NEEDED FOR SLEEP. 60 tablet 1   venlafaxine (EFFEXOR) 37.5 MG tablet Take 37.5 mg by mouth daily.     vitamin B-12 100 MCG tablet Take 1 tablet (100 mcg total) by mouth daily. 30 tablet 0   Facility-Administered Medications Prior to Visit  Medication Dose Route Frequency Provider Last Rate Last Admin   ipratropium-albuterol (DUONEB) 0.5-2.5 (3) MG/3ML nebulizer solution 3 mL  3 mL Nebulization Once Cobb, Karie Schwalbe, NP       methylPREDNISolone acetate (DEPO-MEDROL) injection 120 mg  120 mg Intramuscular Once Cobb, Karie Schwalbe, NP       Review of Systems  Review of Systems  Constitutional: Negative.   HENT:  Positive for congestion.   Respiratory:  Positive for cough and shortness of breath.     Physical Exam  BP (!) 142/82 (BP Location: Left Arm, Patient Position: Sitting, Cuff Size: Normal)    Pulse 81    Temp 97.9 F (36.6 C) (Oral)    Wt 189 lb 6.4 oz (85.9 kg)    SpO2 99%    BMI 31.52 kg/m   Physical Exam Constitutional:      Appearance: Normal appearance.  HENT:     Head: Normocephalic and atraumatic.     Mouth/Throat:     Mouth: Mucous membranes are moist.     Pharynx: Oropharynx is clear.  Cardiovascular:     Rate and Rhythm: Normal rate and regular rhythm.  Pulmonary:     Effort: Pulmonary effort is normal. No respiratory distress.     Breath sounds: Wheezing and rhonchi present.  Musculoskeletal:        General: Normal range of motion.  Skin:    General: Skin is warm and dry.  Neurological:     General: No focal deficit present.     Mental Status: She is alert and oriented to person, place, and time. Mental status is at baseline.  Psychiatric:        Mood and Affect: Mood normal.        Behavior: Behavior normal.        Thought Content: Thought content normal.        Judgment: Judgment normal.     Lab Results:  CBC    Component Value Date/Time   WBC 13.8 (H) 11/21/2020 0111   RBC 5.16 (H) 11/21/2020 0111   HGB 11.0 (L) 11/21/2020 0111   HCT 38.3 11/21/2020 0111   PLT 490 (H) 11/21/2020 0111   MCV 74.2 (L) 11/21/2020 0111   MCH 21.3 (L) 11/21/2020 0111   MCHC 28.7 (L) 11/21/2020 0111   RDW 24.9 (H) 11/21/2020 0111   LYMPHSABS 2.7 11/21/2020 0111   MONOABS 1.0 11/21/2020 0111   EOSABS 0.3 11/21/2020 0111   BASOSABS 0.1 11/21/2020 0111    BMET    Component Value Date/Time   NA 136 11/21/2020 0111   K 3.9 11/21/2020 0111   CL 96 (L) 11/21/2020 0111   CO2 32 11/21/2020 0111   GLUCOSE 99 11/21/2020 0111   BUN 9 11/21/2020 0111   CREATININE 0.59 11/21/2020 0111   CREATININE 0.75 08/31/2015 1259  CALCIUM 9.3 11/21/2020 0111   GFRNONAA >60 11/21/2020 0111   GFRNONAA >89 08/31/2015 1259   GFRAA >60 12/26/2019 1645   GFRAA >89 08/31/2015 1259    BNP    Component Value Date/Time   BNP 60.3 11/20/2020 0120    ProBNP    Component Value Date/Time   PROBNP 59.9 07/30/2012 2207    Imaging: No results found.   Assessment & Plan:    COPD (chronic obstructive pulmonary disease) ([place]) - Current smoker with chronic bronchitis. Clinical symptoms are consistent with COPD GOLD C. Pulmonary function testing showed moderate restriction. She noticed improvement with addition of Trelegy 12mcg but has breakthrough symptoms in the evening. Recommend changing to [employer] two puffs morning and evening. Advised she start using flutter valve three times a day to help loosen congestion.  Follow-up first available with Dr. 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 Mild paratracheal and subcarinal adenopathy.There is some tree-in-bud nodularity in the right lower lobe probably from atypical infectious bronchiolitis given the morphology. - Given patient hx tobacco smoking and chronic cough I am concerned she could have an underlying malignancy vs chronic infection. Checking sputum samples and will discuss getting bronchoscopy vs PET imaging with Dr. Candida Peeling.   Chronic respiratory failure with hypoxia (HCC) - Stable; Continue supplemental oxygen to maintain O2 >88-90% - Referral placed for best fit/POC with DME company   40 mins spent on case: > 50% face to face   Martyn Ehrich, NP 06/29/2021

## 2021-06-28 NOTE — Assessment & Plan Note (Addendum)
-   Current smoker with chronic bronchitis. Clinical symptoms are consistent with COPD GOLD C. Pulmonary function testing showed moderate restriction. She noticed improvement with addition of Trelegy 195mcg but has breakthrough symptoms in the evening. Recommend changing to [employer] two puffs morning and evening. Advised she start using flutter valve three times a day to help loosen congestion.  Follow-up first available with Dr. Lamonte Sakai (2-4 weeks)

## 2021-06-29 NOTE — Assessment & Plan Note (Signed)
-   Stable; Continue supplemental oxygen to maintain O2 >88-90% - Referral placed for best fit/POC with DME company

## 2021-06-29 NOTE — Addendum Note (Signed)
Addended by: Martyn Ehrich on: 06/29/2021 10:12 AM   Modules accepted: Orders

## 2021-06-30 ENCOUNTER — Telehealth: Payer: Self-pay | Admitting: Emergency Medicine

## 2021-07-01 ENCOUNTER — Other Ambulatory Visit (HOSPITAL_COMMUNITY): Payer: Self-pay | Admitting: Physician Assistant

## 2021-07-01 DIAGNOSIS — F3181 Bipolar II disorder: Secondary | ICD-10-CM

## 2021-07-01 NOTE — Telephone Encounter (Signed)
Noted.  Will close encounter.  

## 2021-07-01 NOTE — Telephone Encounter (Signed)
This was faxed back on 06/28/2021 and has been sent to scan I do not have it any longer.

## 2021-07-16 ENCOUNTER — Telehealth: Payer: Self-pay | Admitting: Emergency Medicine

## 2021-07-16 NOTE — Telephone Encounter (Signed)
Adriana Hall, have you seen this CMN

## 2021-07-17 IMAGING — DX DG CHEST 1V PORT
1 series · 1 of 1 positions shown · non-contrast
Comparison: November 18, 2020

CLINICAL DATA: Pneumonia

EXAM:
PORTABLE CHEST 1 VIEW

[chest ap]
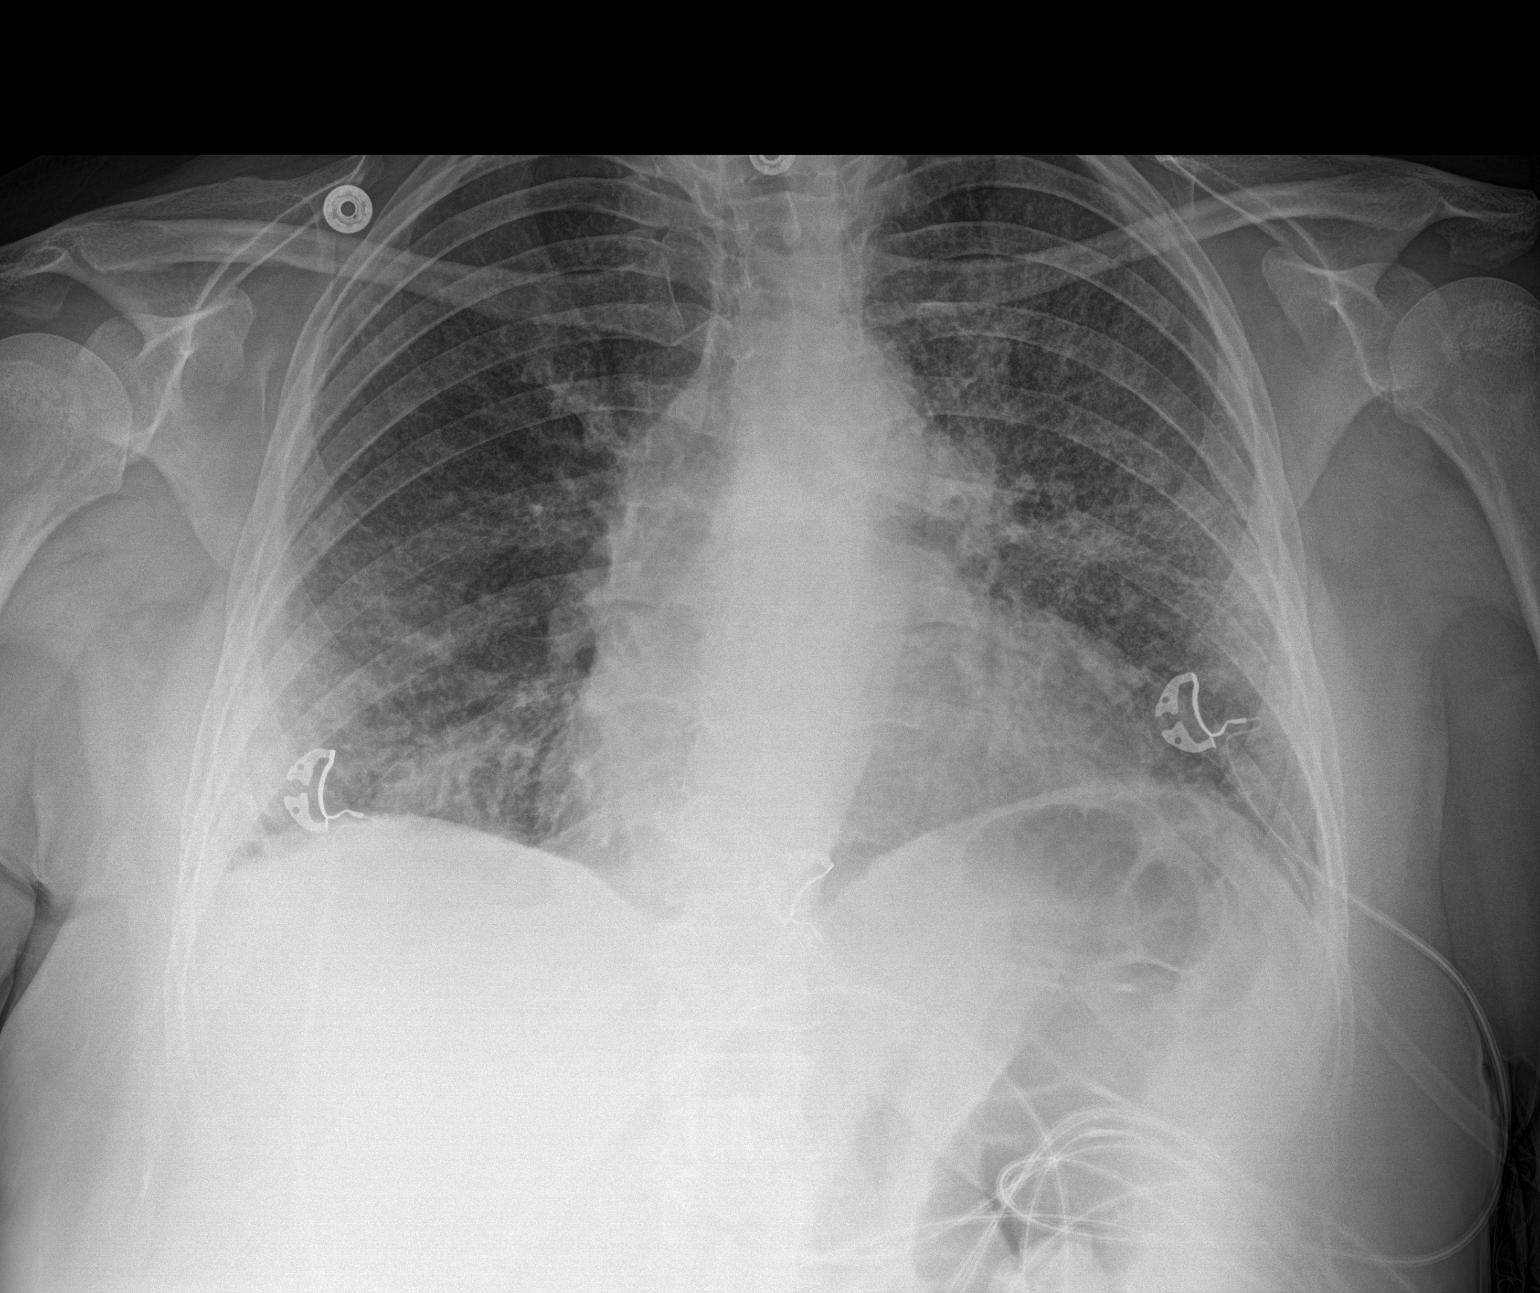

[1 of 1 positions shown; findings below may reference images not displayed]

FINDINGS: Airspace opacity is again noted in portions of the left mid lung and
left base as well as in the right lower lung region. Appearance
similar to 1 day prior. No new opacity evident. Heart is mildly
enlarged, stable. Pulmonary vascularity normal. No evident
adenopathy. Postoperative change noted in lower cervical spine.
IMPRESSION: Patchy airspace disease bilaterally, similar to 2 days prior. No
evident new opacity. Stable cardiac prominence.

## 2021-07-19 NOTE — Telephone Encounter (Signed)
Last one I had was sent back on 07/16/2021

## 2021-07-22 ENCOUNTER — Other Ambulatory Visit: Payer: Self-pay

## 2021-07-22 ENCOUNTER — Encounter: Payer: Self-pay | Admitting: Emergency Medicine

## 2021-07-22 ENCOUNTER — Ambulatory Visit (INDEPENDENT_AMBULATORY_CARE_PROVIDER_SITE_OTHER): Payer: Medicaid Other | Admitting: Emergency Medicine

## 2021-07-22 DIAGNOSIS — J181 Lobar pneumonia, unspecified organism: Secondary | ICD-10-CM | POA: Diagnosis not present

## 2021-07-22 DIAGNOSIS — R9389 Abnormal findings on diagnostic imaging of other specified body structures: Secondary | ICD-10-CM

## 2021-07-22 DIAGNOSIS — J411 Mucopurulent chronic bronchitis: Secondary | ICD-10-CM

## 2021-07-22 DIAGNOSIS — J189 Pneumonia, unspecified organism: Secondary | ICD-10-CM | POA: Diagnosis not present

## 2021-07-22 DIAGNOSIS — J9601 Acute respiratory failure with hypoxia: Secondary | ICD-10-CM

## 2021-07-22 MED ORDER — SPIRIVA RESPIMAT 1.25 MCG/ACT IN AERS: 2.0000 | INHALATION_SPRAY | Freq: Every day | RESPIRATORY_TRACT | 0 refills | Status: DC

## 2021-07-22 NOTE — Assessment & Plan Note (Signed)
Continue oxygen at 2 L/min 

## 2021-07-22 NOTE — Assessment & Plan Note (Signed)
The right lower lobe opacity is smaller, hopefully consistent with resolving right lower lobe pneumonia.  I will repeat a CT-PET scan to better assess that area.  She has resolving interstitial changes bilaterally in all lobes, question whether this is sequela from acute lung injury during her hospitalization.  Consider also DIP or RB-ILD.  Clearly she needs to stop smoking, this will have to be high priority given the differential diagnosis.  We will characterize her infiltrates on repeat scan.  She may require sampling of these areas as well, BAL or biopsies.

## 2021-07-22 NOTE — Progress Notes (Signed)
Subjective:    Patient ID: Adriana Hall, female    DOB: 1971/02/09, 51 y.o.   MRN: [id number]  HPI 51 year old smoker (50+ pack years) with a history of opiate abuse, bipolar disease, chronic headaches, hypertension. She was admitted for pneumonia and an associated COPD exacerbation.  She had an associated pleural effusion, cytology negative.  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  She has exertional SOB, activity is limited. She is wearing 2L/min. Currently managed on Breztri samples, Trelegy apparently not covered by her medicaid.   CT chest 05/07/2021 reviewed by me shows paraseptal emphysema, decrease in size of a peripheral right subpleural masslike lesion, now 4.1 cm down from 8.2 cm in largest dimension other scattered pulmonary nodules and some hazy peripheral groundglass change were present but improved compared with June 2022.  8 mm left lower lobe nodule   Review of Systems As per HPI  Past Medical History:  Diagnosis Date   Anxiety    10 years   Arrhythmia    5 years   Bipolar 1 disorder, depressed ([place])    Bronchitis    Colitis 03-01-2011   Colonoscopy   Depression    10 years   Headache(784.0)    Chronic for 30 yrs   Hemorrhoids 03-01-2011   Colonoscopy    Hypertension      Family History  Problem Relation Age of Onset   Pancreatic cancer Maternal Grandfather    Liver cancer Maternal Grandfather    Clotting disorder Mother    Heart disease Mother    Diabetes Paternal Grandmother        And PGF   Other Father        Brain tumor   Colon cancer Neg Hx      Social History   Socioeconomic History   Marital status: Married    Spouse name: Not on file   Number of children: 3   Years of education: Not on file   Highest education level: Not on file  Occupational History   Occupation: Self employed  Tobacco Use   Smoking status: Every Day    Packs/day: 1.00    Years:  30.00    Pack years: 30.00    Types: Cigarettes   Smokeless tobacco: Never   Tobacco comments:    Smokes 0.5 packs per day [facility] 07/22/21  Vaping Use   Vaping Use: Former   Devices: tried vaping once  Substance and Sexual Activity   Alcohol use: Yes    Alcohol/week: 4.0 standard drinks    Types: 4 Cans of beer per week   Drug use: Yes    Types: Benzodiazepines, Other-see comments, Marijuana    Comment: oxycontin - no longer using - went to rehab   Sexual activity: Not on file  Other Topics Concern   Not on file  Social History Narrative   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      Home Situation: lives with husband and daughter      Spiritual Beliefs: Christian      Lifestyle: no regular CV exercise; diet is ok            Social Determinants of Radio broadcast assistant Strain: Not on file  Food Insecurity: Not on file  Transportation Needs: Not on file  Physical Activity: Not on file  Stress: Not on file  Social Connections: Not on file  Intimate Partner  Violence: Not on file     Allergies  Allergen Reactions   Biotin     'Makes me extremely sick'   Doxycycline Nausea And Vomiting     Outpatient Medications Prior to Visit  Medication Sig Dispense Refill   acetaminophen (TYLENOL) 325 MG tablet Take 2 tablets (650 mg total) by mouth every 6 (six) hours as needed for mild pain (or Fever >/= 101). 30 tablet 0   albuterol (VENTOLIN HFA) 108 (90 Base) MCG/ACT inhaler Inhale 2 puffs into the lungs every 6 (six) hours as needed for wheezing or shortness of breath. 8 g 2   ALPRAZolam (XANAX) 0.5 MG tablet Take 0.5 mg by mouth as needed.     benzonatate (TESSALON) 100 MG capsule Take 1 capsule (100 mg total) by mouth 3 (three) times daily as needed for cough. 30 capsule 1   buprenorphine-naloxone (SUBOXONE) 8-2 mg SUBL SL tablet Place 1 tablet under the tongue in the morning and at bedtime.     diphenhydrAMINE (BENADRYL) 25 MG tablet Take  50 mg by mouth at bedtime.     ferrous sulfate 325 (65 FE) MG tablet Take 1 tablet (325 mg total) by mouth daily with breakfast. 30 tablet 0   fluticasone (FLONASE) 50 MCG/ACT nasal spray Place 1 spray into both nostrils daily. 18.2 mL 2   fluvoxaMINE (LUVOX) 100 MG tablet TAKE 1 TABLET BY MOUTH AT BEDTIME FOR 6 DAYS,THEN CONTINUE TAKING 2 TABLETS (100MG ) AT BEDTIME 30 tablet 1   folic acid (FOLVITE) 1 MG tablet Take 1 tablet (1 mg total) by mouth daily. 30 tablet 0   gabapentin (NEURONTIN) 400 MG capsule TAKE 1 CAPSULE (400 MG TOTAL) BY MOUTH 3 (THREE) TIMES DAILY. 90 capsule 1   hydrOXYzine (ATARAX) 25 MG tablet Take 1 tablet (25 mg total) by mouth 3 (three) times daily as needed for anxiety. 90 tablet 1   hydrOXYzine (VISTARIL) 25 MG capsule Take 25 mg by mouth 3 (three) times daily as needed.     ibuprofen (ADVIL) 200 MG tablet Take 400 mg by mouth every 6 (six) hours as needed for fever or moderate pain.     nicotine (NICODERM CQ - DOSED IN MG/24 HOURS) 21 mg/24hr patch Place 1 patch (21 mg total) onto the skin daily. 28 patch 0   nitroGLYCERIN (NITROLINGUAL) 0.4 MG/SPRAY spray Place 1 spray under the tongue once as needed for chest pain. 12 g 0   Nutritional Supplements (ESTROVEN PO) Take 1 tablet by mouth daily.     OLANZapine (ZYPREXA) 10 MG tablet Take 1 tablet (10 mg total) by mouth at bedtime. 30 tablet 1   omeprazole (PRILOSEC) 40 MG capsule Take 40 mg by mouth daily.     ondansetron (ZOFRAN) 4 MG tablet Take 1 tablet (4 mg total) by mouth every 8 (eight) hours as needed for nausea or vomiting. 30 tablet 0   predniSONE (DELTASONE) 10 MG tablet 4 tabs for 3 days, then 3 tabs for 3 days, 2 tabs for 3 days, then 1 tab for 3 days, then stop 20 tablet 0   traZODone (DESYREL) 100 MG tablet TAKE 2 TABLETS (200 MG TOTAL) BY MOUTH AT BEDTIME AS NEEDED FOR SLEEP. 60 tablet 1   venlafaxine (EFFEXOR) 37.5 MG tablet Take 37.5 mg by mouth daily.     vitamin B-12 100 MCG tablet Take 1 tablet (100 mcg  total) by mouth daily. 30 tablet 0   Budeson-Glycopyrrol-Formoterol (BREZTRI AEROSPHERE) 160-9-4.8 MCG/ACT AERO Inhale 2 puffs into the lungs  in the morning and at bedtime. (Patient not taking: Reported on 07/22/2021) 5.9 g 0   Fluticasone-Umeclidin-Vilant (TRELEGY ELLIPTA) 100-62.5-25 MCG/ACT AEPB Inhale 100 mcg into the lungs daily. (Patient not taking: Reported on 07/22/2021) 1 each 3   Fluticasone-Umeclidin-Vilant (TRELEGY ELLIPTA) 100-62.5-25 MCG/ACT AEPB Inhale 1 puff into the lungs daily. (Patient not taking: Reported on 07/22/2021) 60 each 0   ipratropium-albuterol (DUONEB) 0.5-2.5 (3) MG/3ML SOLN Take 3 mLs by nebulization every 6 (six) hours as needed. (Patient not taking: Reported on 07/22/2021) 360 mL 12   Facility-Administered Medications Prior to Visit  Medication Dose Route Frequency Provider Last Rate Last Admin   ipratropium-albuterol (DUONEB) 0.5-2.5 (3) MG/3ML nebulizer solution 3 mL  3 mL Nebulization Once Cobb, Karie Schwalbe, NP       methylPREDNISolone acetate (DEPO-MEDROL) injection 120 mg  120 mg Intramuscular Once Cobb, Karie Schwalbe, NP             Objective:   Physical Exam Vitals:   07/22/21 1442  BP: (!) 144/76  Pulse: 83  Temp: 98.2 F (36.8 C)  TempSrc: Oral  SpO2: 97%  Weight: 187 lb 9.6 oz (85.1 kg)  Height: 5\' 4"  (1.626 m)   Gen: Pleasant, chronically ill-appearing woman, in no distress,  normal affect  ENT: No lesions,  mouth clear,  oropharynx clear, no postnasal drip, hoarse voice  Neck: No JVD, no stridor  Lungs: No use of accessory muscles, bilateral inspiratory and expiratory rhonchi, expiratory wheezes  Cardiovascular: RRR, heart sounds normal, no murmur or gallops, no peripheral edema  Musculoskeletal: No deformities, no cyanosis or clubbing  Neuro: alert, awake, non focal  Skin: Warm, no lesions or rash      Assessment & Plan:   COPD (chronic obstructive pulmonary disease) (HCC) Presumed COPD with some emphysematous change on imaging  and mixed disease on pulmonary function testing.  She has benefited from the addition of Breztri although she preferred Trelegy.  We will have to figure out which ICS/LABA/LAMA combination is covered by her Medicaid.  We will give her any samples we can find until we can sort this out.  Underscored the importance of smoking cessation with her today  CAP (community acquired pneumonia) She was hospitalized in June 2022 with a severe right lower lobe pneumonia with bilateral groundglass infiltrates.  Question whether the groundglass was pneumonia, ARDS/ALI.  Consider also exposure related pneumonitis like RB-ILD or DIP.  She was treated with steroids during the hospitalization.  The right lower lobe rounded infiltrate is smaller on her CT chest from 05/07/2021 which is reassuring.  She will still need serial follow-up to ensure there is not a mass hidden in that region.  Acute respiratory failure with hypoxia (HCC) Continue oxygen at 2 L/min  Abnormal CT of the chest The right lower lobe opacity is smaller, hopefully consistent with resolving right lower lobe pneumonia.  I will repeat a CT-PET scan to better assess that area.  She has resolving interstitial changes bilaterally in all lobes, question whether this is sequela from acute lung injury during her hospitalization.  Consider also DIP or RB-ILD.  Clearly she needs to stop smoking, this will have to be high priority given the differential diagnosis.  We will characterize her infiltrates on repeat scan.  She may require sampling of these areas as well, BAL or biopsies.  Baltazar Apo, MD, PhD 07/22/2021, 5:11 PM [facility] [phone] or if no answer before 7:00PM call [phone] For any issues after 7:00PM please call eLink  [phone] ° °

## 2021-07-22 NOTE — Patient Instructions (Signed)
We will work on determining which inhaler combination will be covered by your insurance so we can continue stable medication going forward. Continue your oxygen at 2 L/min We will plan to perform a PET/CT scan in March 2023 We talked about the importance of stopping smoking today.  Please cut down and our ultimate goal will be to stop altogether. Follow with Dr Lamonte Sakai in 1 month or next available after your scan so we can review together.

## 2021-07-22 NOTE — Telephone Encounter (Signed)
Called [employer] and spoke with rep and was advised that they received the fax but they need line [phone] to be filled out. They are going to refax this to Chan's fax. Will forward to her so she can f/u on this, thanks!

## 2021-07-22 NOTE — Assessment & Plan Note (Signed)
She was hospitalized in June 2022 with a severe right lower lobe pneumonia with bilateral groundglass infiltrates.  Question whether the groundglass was pneumonia, ARDS/ALI.  Consider also exposure related pneumonitis like RB-ILD or DIP.  She was treated with steroids during the hospitalization.  The right lower lobe rounded infiltrate is smaller on her CT chest from 05/07/2021 which is reassuring.  She will still need serial follow-up to ensure there is not a mass hidden in that region.

## 2021-07-22 NOTE — Assessment & Plan Note (Signed)
Presumed COPD with some emphysematous change on imaging and mixed disease on pulmonary function testing.  She has benefited from the addition of Breztri although she preferred Trelegy.  We will have to figure out which ICS/LABA/LAMA combination is covered by her Medicaid.  We will give her any samples we can find until we can sort this out.  Underscored the importance of smoking cessation with her today

## 2021-07-23 LAB — RESPIRATORY CULTURE OR RESPIRATORY AND SPUTUM CULTURE: MICRO NUMBER:: 13018563

## 2021-07-23 NOTE — Telephone Encounter (Signed)
I have the cmn to give to [facility]

## 2021-07-27 ENCOUNTER — Other Ambulatory Visit: Payer: Self-pay

## 2021-07-27 ENCOUNTER — Other Ambulatory Visit (HOSPITAL_COMMUNITY): Payer: Medicaid Other

## 2021-07-27 MED ORDER — BREZTRI AEROSPHERE 160-9-4.8 MCG/ACT IN AERO: 2.0000 | INHALATION_SPRAY | Freq: Two times a day (BID) | RESPIRATORY_TRACT | 11 refills | Status: DC

## 2021-08-04 ENCOUNTER — Telehealth: Payer: Self-pay

## 2021-08-04 ENCOUNTER — Other Ambulatory Visit (HOSPITAL_COMMUNITY): Payer: Self-pay

## 2021-08-04 NOTE — Telephone Encounter (Signed)
Routing info to [place]. ?

## 2021-08-04 NOTE — Telephone Encounter (Signed)
Therapy switched from Trelegy to [facility] at 06/28/21 OV with B Volanda Napoleon, the PAP application sent in to [employer] was subsequently denied due to pt having coverage through traditional Medicaid.  ? ?Per 07/22/21 OV with R Byrum pt had seen benefit from change in therapy, however states that she prefers the Trelegy. Plan was made to proceed with whichever formulation of ICS/LABA/LAMA is covered by insurance. ? ?Both inhalers require prior authorization, previous PA submission for Trelegy on 06/11/21 was denied due to lack of step therapy (trial and failure of Advair, Dulera, AND Symbicort). ? ?Spiriva was prescribed at 2/16 OV (which is covered by insurance), all three the  ICS/LABA options listed above are also covered and could be added to Spiriva for full ICS/LABA/LAMA treatment. ? ?Routing for f/u by provider. ?

## 2021-08-05 MED ORDER — BUDESONIDE-FORMOTEROL FUMARATE 160-4.5 MCG/ACT IN AERO: 2.0000 | INHALATION_SPRAY | Freq: Two times a day (BID) | RESPIRATORY_TRACT | 6 refills | Status: DC

## 2021-08-05 NOTE — Telephone Encounter (Signed)
Called and left voicemail for patient to call office back in regards to medication changes.  ?

## 2021-08-05 NOTE — Telephone Encounter (Signed)
Would go with spiriva respimat 1.25 + symbicort 160 if the patient agrees to this change ?

## 2021-08-05 NOTE — Telephone Encounter (Signed)
Patient is returning phone call. Patient phone number is [phone]. ?

## 2021-08-05 NOTE — Telephone Encounter (Signed)
Spoke with pt and explained inhaler change. Pt stated already having Spiriva at home so Symbicort was ordered. Pt stated understanding. Nothing further needed at this time.  ?

## 2021-08-06 ENCOUNTER — Telehealth (INDEPENDENT_AMBULATORY_CARE_PROVIDER_SITE_OTHER): Payer: Medicaid Other | Admitting: Physician Assistant

## 2021-08-06 ENCOUNTER — Encounter (HOSPITAL_COMMUNITY): Payer: Self-pay | Admitting: Physician Assistant

## 2021-08-06 DIAGNOSIS — F419 Anxiety disorder, unspecified: Secondary | ICD-10-CM

## 2021-08-06 DIAGNOSIS — F3181 Bipolar II disorder: Secondary | ICD-10-CM

## 2021-08-06 MED ORDER — GABAPENTIN 400 MG PO CAPS: ORAL_CAPSULE | ORAL | 1 refills | Status: DC

## 2021-08-06 MED ORDER — HYDROXYZINE HCL 25 MG PO TABS: 25.0000 mg | ORAL_TABLET | Freq: Three times a day (TID) | ORAL | 1 refills | Status: DC | PRN

## 2021-08-06 MED ORDER — FLUVOXAMINE MALEATE 100 MG PO TABS: 100.0000 mg | ORAL_TABLET | Freq: Every day | ORAL | 2 refills | Status: DC

## 2021-08-06 MED ORDER — TRAZODONE HCL 100 MG PO TABS: ORAL_TABLET | ORAL | 1 refills | Status: DC

## 2021-08-06 MED ORDER — OLANZAPINE 10 MG PO TABS
10.0000 mg | ORAL_TABLET | Freq: Every day | ORAL | 1 refills | Status: DC
Start: 2021-08-06 — End: 2021-10-08
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
## 2021-08-10 ENCOUNTER — Ambulatory Visit (HOSPITAL_COMMUNITY): Admission: RE | Admit: 2021-08-10 | Payer: Medicaid Other | Source: Ambulatory Visit

## 2021-08-10 ENCOUNTER — Ambulatory Visit (HOSPITAL_COMMUNITY): Payer: Medicaid Other | Attending: Emergency Medicine

## 2021-08-11 ENCOUNTER — Other Ambulatory Visit: Payer: Self-pay | Admitting: Nurse Practitioner

## 2021-08-11 DIAGNOSIS — R110 Nausea: Secondary | ICD-10-CM

## 2021-08-23 ENCOUNTER — Telehealth: Payer: Self-pay

## 2021-08-23 NOTE — Telephone Encounter (Signed)
ATC LVMTCB r/t if CT had been done yet. If not we can reschedule pt till after 09/01/21 which is when the CT we ordered is scheduled.   ?

## 2021-08-24 ENCOUNTER — Ambulatory Visit: Payer: Medicaid Other | Admitting: Emergency Medicine

## 2021-09-01 ENCOUNTER — Ambulatory Visit (HOSPITAL_COMMUNITY)
Admission: RE | Admit: 2021-09-01 | Discharge: 2021-09-01 | Disposition: A | Discharge status: Home / Self Care | Payer: Medicaid Other | Source: Ambulatory Visit | Attending: Emergency Medicine | Admitting: Emergency Medicine

## 2021-09-01 ENCOUNTER — Other Ambulatory Visit: Payer: Self-pay

## 2021-09-01 DIAGNOSIS — J181 Lobar pneumonia, unspecified organism: Secondary | ICD-10-CM | POA: Insufficient documentation

## 2021-09-01 LAB — GLUCOSE, CAPILLARY: Glucose-Capillary: 121 mg/dL — ABNORMAL HIGH (ref 70–99)

## 2021-09-01 MED ORDER — FLUDEOXYGLUCOSE F - 18 (FDG) INJECTION
9.3600 | Freq: Once | INTRAVENOUS | Status: AC
  Administered 2021-09-01: 9.36 via INTRAVENOUS

## 2021-09-08 ENCOUNTER — Ambulatory Visit (INDEPENDENT_AMBULATORY_CARE_PROVIDER_SITE_OTHER): Payer: Medicaid Other | Admitting: Emergency Medicine

## 2021-09-08 ENCOUNTER — Encounter: Payer: Self-pay | Admitting: Emergency Medicine

## 2021-09-08 DIAGNOSIS — R609 Edema, unspecified: Secondary | ICD-10-CM | POA: Insufficient documentation

## 2021-09-08 DIAGNOSIS — J411 Mucopurulent chronic bronchitis: Secondary | ICD-10-CM | POA: Diagnosis not present

## 2021-09-08 DIAGNOSIS — Z720 Tobacco use: Secondary | ICD-10-CM | POA: Diagnosis not present

## 2021-09-08 DIAGNOSIS — R9389 Abnormal findings on diagnostic imaging of other specified body structures: Secondary | ICD-10-CM | POA: Diagnosis not present

## 2021-09-08 MED ORDER — SPIRIVA RESPIMAT 2.5 MCG/ACT IN AERS: 2.0000 | INHALATION_SPRAY | Freq: Every day | RESPIRATORY_TRACT | 0 refills | Status: DC

## 2021-09-08 NOTE — Patient Instructions (Signed)
We reviewed your CT scan and PET scan today.  Your right-sided pneumonia has resolved completely.  Your small left lower lobe pulmonary nodule is stable and can be deemed benign.  There is still evidence for widespread lung inflammation ?We will repeat your sputum cultures ?We will plan to continue Symbicort and Spiriva as you have been taking them. ?Keep albuterol available to use 2 puffs if needed shortness of breath, chest tightness, wheezing. ?We will arrange for an echocardiogram ?We will refer you to be seen by Cardiology ?Please continue oxygen at 2 L/min ?Congratulations on decreasing your cigarettes.  Support your efforts to stop completely.  This will be very important to your health going forward. ?Follow with Dr. Lamonte Sakai in 2 months or sooner if you have any problems.  ? ?

## 2021-09-08 NOTE — Assessment & Plan Note (Signed)
Discussed cessation with her today 

## 2021-09-08 NOTE — Assessment & Plan Note (Signed)
I will arrange for an echocardiogram to evaluate for possible cardiac cause.  She would probably benefit from referral to cardiology and I will arrange for this. ?

## 2021-09-08 NOTE — Assessment & Plan Note (Signed)
We will plan to continue Symbicort and Spiriva as you have been taking them. ?Keep albuterol available to use 2 puffs if needed shortness of breath, chest tightness, wheezing. ?

## 2021-09-08 NOTE — Assessment & Plan Note (Signed)
CT chest shows resolution of her right-sided pneumonia, stable left lower lobe pulmonary nodule.  There is still widespread inflammatory change, interstitial change.  Etiology unclear.  Her autoimmune evaluation has been negative except for an elevated CRP.  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  Recheck sputum culture, the last 1 was not excepted or cultured.  She may still ultimately require bronchoscopy to get culture data, consider biopsies.  She desperately needs to stop smoking if this is tobacco related pneumonitis. ? ? ?We reviewed your CT scan and PET scan today.  Your right-sided pneumonia has resolved completely.  Your small left lower lobe pulmonary nodule is stable and can be deemed benign.  There is still evidence for widespread lung inflammation ?We will repeat your sputum cultures ? ?

## 2021-09-08 NOTE — Progress Notes (Signed)
? ?Subjective:  ? ? Patient ID: Adriana Hall, female    DOB: [birth year], 51 y.o.   MRN: [id number] ? ?HPI ?51 year old smoker (50+ pack years) with a history of opiate abuse, bipolar disease, chronic headaches, hypertension. She was admitted for pneumonia and an associated COPD exacerbation.  She had an associated pleural effusion, cytology negative.  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 ? ?She has exertional SOB, activity is limited. She is wearing 2L/min. Currently managed on Breztri samples, Trelegy apparently not covered by her medicaid.  ? ?CT chest 05/07/2021 reviewed by me shows paraseptal emphysema, decrease in size of a peripheral right subpleural masslike lesion, now 4.1 cm down from 8.2 cm in largest dimension other scattered pulmonary nodules and some hazy peripheral groundglass change were present but improved compared with June 2022.  8 mm left lower lobe nodule ? ? ?ROV 09/08/21 [name] returns for follow-up.  She is 73, active smoker (50+ pack years).  I have been following her for COPD following admission for severe right-sided pneumonia, associated parapneumonic effusion (cytology negative).  She had scattered groundglass infiltrates, question RB-ILD.  Most recent CT showed that the right upper lobe opacity was decreasing in size, groundglass infiltrates improving.  We repeated imaging as below.  She remains on oxygen at 2 L/min.  Because has been a barrier to getting on stable BD therapy-most recently ordered Symbicort and Spiriva. She benefited from these. She lost her insurance, has not been on her BP regimen for years.  ? ?PET/super D CT 09/01/2021 reviewed by me shows almost complete resolution of the 4.1 cm right subpleural masslike lesion with only residual bandlike scar, no hypermetabolism.  Mild right paratracheal and subcarinal adenopathy.  Peripheral interstitial and groundglass densities bilaterally  likely inflammatory.  6 mm left lower lobe pulmonary nodule is not hypermetabolic, has been present and stable since 2012. ? ?11/17/2020: ACE level 41, RF 11.5, CCP 5, ANA negative, ANCA negative, CRP 8.0 (elevated), ESR 50 ? ? ?Review of Systems ?As per HPI ? ?Past Medical History:  ?Diagnosis Date  ? Anxiety   ? 10 years  ? Arrhythmia   ? 5 years  ? Bipolar 1 disorder, depressed ([place])   ? Bronchitis   ? Colitis 03-01-2011  ? Colonoscopy  ? Depression   ? 10 years  ? Headache(784.0)   ? Chronic for 30 yrs  ? Hemorrhoids 03-01-2011  ? Colonoscopy   ? Hypertension   ?  ? ?Family History  ?Problem Relation Age of Onset  ? Pancreatic cancer Maternal Grandfather   ? Liver cancer Maternal Grandfather   ? Clotting disorder Mother   ? Heart disease Mother   ? Diabetes Paternal Grandmother   ?     And PGF  ? Other Father   ?     Brain tumor  ? Colon cancer Neg Hx   ?  ? ?Social History  ? ?Socioeconomic History  ? Marital status: Married  ?  Spouse name: Not on file  ? Number of children: 3  ? Years of education: Not on file  ? Highest education level: Not on file  ?Occupational History  ? Occupation: Self employed  ?Tobacco Use  ? Smoking status: Every Day  ?  Packs/day: 1.00  ?  Years: 30.00  ?  Pack years: 30.00  ?  Types: Cigarettes  ? Smokeless tobacco: Never  ? Tobacco comments:  ?  5 cigarettes smoked per day ARJ  09/08/21  ?Vaping Use  ? Vaping Use: Former  ? Devices: tried vaping once  ?Substance and Sexual Activity  ? Alcohol use: Yes  ?  Alcohol/week: 4.0 standard drinks  ?  Types: 4 Cans of beer per week  ? Drug use: Yes  ?  Types: Benzodiazepines, Other-see comments, Marijuana  ?  Comment: oxycontin - no longer using - went to rehab  ? Sexual activity: Not on file  ?Other Topics Concern  ? Not on file  ?Social History Narrative  ? 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   ? Home Situation: lives with husband and daughter  ?   ? Spiritual Beliefs: Christian  ?   ? Lifestyle: no regular  CV exercise; diet is ok  ?   ?   ?   ? ?Social Determinants of Health  ? ?Financial Resource Strain: Not on file  ?Food Insecurity: Not on file  ?Transportation Needs: Not on file  ?Physical Activity: Not on file  ?Stress: Not on file  ?Social Connections: Not on file  ?Intimate Partner Violence: Not on file  ?  ? ?Allergies  ?Allergen Reactions  ? Biotin   ?  'Makes me extremely sick'  ? Doxycycline Nausea And Vomiting  ?  ? ?Outpatient Medications Prior to Visit  ?Medication Sig Dispense Refill  ? acetaminophen (TYLENOL) 325 MG tablet Take 2 tablets (650 mg total) by mouth every 6 (six) hours as needed for mild pain (or Fever >/= 101). 30 tablet 0  ? albuterol (VENTOLIN HFA) 108 (90 Base) MCG/ACT inhaler Inhale 2 puffs into the lungs every 6 (six) hours as needed for wheezing or shortness of breath. 8 g 2  ? ALPRAZolam (XANAX) 0.5 MG tablet Take 0.5 mg by mouth as needed.    ? benzonatate (TESSALON) 100 MG capsule Take 1 capsule (100 mg total) by mouth 3 (three) times daily as needed for cough. 30 capsule 1  ? budesonide-formoterol (SYMBICORT) 160-4.5 MCG/ACT inhaler Inhale 2 puffs into the lungs in the morning and at bedtime. 1 each 6  ? buprenorphine-naloxone (SUBOXONE) 8-2 mg SUBL SL tablet Place 1 tablet under the tongue in the morning and at bedtime.    ? diphenhydrAMINE (BENADRYL) 25 MG tablet Take 50 mg by mouth at bedtime.    ? ferrous sulfate 325 (65 FE) MG tablet Take 1 tablet (325 mg total) by mouth daily with breakfast. 30 tablet 0  ? fluticasone (FLONASE) 50 MCG/ACT nasal spray Place 1 spray into both nostrils daily. 18.2 mL 2  ? fluvoxaMINE (LUVOX) 100 MG tablet Take 1 tablet (100 mg total) by mouth at bedtime. 30 tablet 2  ? folic acid (FOLVITE) 1 MG tablet Take 1 tablet (1 mg total) by mouth daily. 30 tablet 0  ? gabapentin (NEURONTIN) 400 MG capsule TAKE 1 CAPSULE (400 MG TOTAL) BY MOUTH 3 (THREE) TIMES DAILY. 90 capsule 1  ? hydrOXYzine (ATARAX) 25 MG tablet Take 1 tablet (25 mg total) by mouth 3  (three) times daily as needed for anxiety. 90 tablet 1  ? ibuprofen (ADVIL) 200 MG tablet Take 400 mg by mouth every 6 (six) hours as needed for fever or moderate pain.    ? ipratropium-albuterol (DUONEB) 0.5-2.5 (3) MG/3ML SOLN Take 3 mLs by nebulization every 6 (six) hours as needed. 360 mL 12  ? nicotine (NICODERM CQ - DOSED IN MG/24 HOURS) 21 mg/24hr patch Place 1 patch (21 mg total) onto the skin daily. [place]abella  patch 0  ? nitroGLYCERIN (NITROLINGUAL) 0.4 MG/SPRAY spray Place 1 spray under the tongue once as needed for chest pain. 12 g 0  ? Nutritional Supplements (ESTROVEN PO) Take 1 tablet by mouth daily.    ? OLANZapine (ZYPREXA) 10 MG tablet Take 1 tablet (10 mg total) by mouth at bedtime. 30 tablet 1  ? omeprazole (PRILOSEC) 40 MG capsule Take 40 mg by mouth daily.    ? ondansetron (ZOFRAN) 4 MG tablet TAKE 1 TABLET BY MOUTH EVERY 8 HOURS AS NEEDED FOR NAUSEA AND VOMITING 30 tablet 0  ? predniSONE (DELTASONE) 10 MG tablet 4 tabs for 3 days, then 3 tabs for 3 days, 2 tabs for 3 days, then 1 tab for 3 days, then stop 20 tablet 0  ? Tiotropium Bromide Monohydrate (SPIRIVA RESPIMAT) 1.25 MCG/ACT AERS Inhale 2 puffs into the lungs daily. 4 g 0  ? traZODone (DESYREL) 100 MG tablet TAKE 2 TABLETS (200 MG TOTAL) BY MOUTH AT BEDTIME AS NEEDED FOR SLEEP. 60 tablet 1  ? vitamin B-12 100 MCG tablet Take 1 tablet (100 mcg total) by mouth daily. 30 tablet 0  ? ?Facility-Administered Medications Prior to Visit  ?Medication Dose Route Frequency Provider Last Rate Last Admin  ? ipratropium-albuterol (DUONEB) 0.5-2.5 (3) MG/3ML nebulizer solution 3 mL  3 mL Nebulization Once Cobb, Karie Schwalbe, NP      ? methylPREDNISolone acetate (DEPO-MEDROL) injection 120 mg  120 mg Intramuscular Once Cobb, Karie Schwalbe, NP      ? ? ? ? ?   ?Objective:  ? Physical Exam ?Vitals:  ? 09/08/21 1430  ?BP: 134/68  ?Pulse: 89  ?Temp: 98.3 ?F (36.8 ?C)  ?TempSrc: Oral  ?SpO2: 97%  ?Weight: 192 lb 3.2 oz (87.2 kg)  ?Height: 5' 4"  (1.626 m)  ? ?Gen:  Pleasant, chronically ill-appearing woman, in no distress,  normal affect ? ?ENT: No lesions,  mouth clear,  oropharynx clear, no postnasal drip, ? ?Neck: No JVD, no stridor ? ?Lungs: No use of accessory m

## 2021-09-10 NOTE — Addendum Note (Signed)
Addended by: Gavin Potters R on: 09/10/2021 03:49 PM ? ? Modules accepted: Orders ? ?

## 2021-09-27 ENCOUNTER — Ambulatory Visit (HOSPITAL_COMMUNITY): Admission: RE | Admit: 2021-09-27 | Payer: Medicaid Other | Source: Ambulatory Visit

## 2021-09-29 ENCOUNTER — Ambulatory Visit: Payer: Medicaid Other | Admitting: Internal Medicine

## 2021-09-30 ENCOUNTER — Telehealth: Payer: Self-pay | Admitting: Emergency Medicine

## 2021-09-30 NOTE — Telephone Encounter (Signed)
Called patient but she did not answer. Left message for her to call back.  

## 2021-10-08 ENCOUNTER — Encounter (HOSPITAL_COMMUNITY): Payer: Self-pay | Admitting: Physician Assistant

## 2021-10-08 ENCOUNTER — Telehealth (INDEPENDENT_AMBULATORY_CARE_PROVIDER_SITE_OTHER): Payer: Medicaid Other | Admitting: Physician Assistant

## 2021-10-08 DIAGNOSIS — F3181 Bipolar II disorder: Secondary | ICD-10-CM | POA: Diagnosis not present

## 2021-10-08 DIAGNOSIS — F419 Anxiety disorder, unspecified: Secondary | ICD-10-CM

## 2021-10-08 MED ORDER — GABAPENTIN 400 MG PO CAPS: 400.0000 mg | ORAL_CAPSULE | Freq: Three times a day (TID) | ORAL | 2 refills | Status: DC

## 2021-10-08 MED ORDER — FLUVOXAMINE MALEATE 100 MG PO TABS: 100.0000 mg | ORAL_TABLET | Freq: Every day | ORAL | 2 refills | Status: DC

## 2021-10-08 MED ORDER — HYDROXYZINE HCL 25 MG PO TABS: 25.0000 mg | ORAL_TABLET | Freq: Three times a day (TID) | ORAL | 2 refills | Status: AC | PRN

## 2021-10-08 MED ORDER — TRAZODONE HCL 100 MG PO TABS: ORAL_TABLET | ORAL | 2 refills | Status: DC

## 2021-10-08 MED ORDER — OLANZAPINE 10 MG PO TABS: 10.0000 mg | ORAL_TABLET | Freq: Every day | ORAL | 2 refills | Status: DC
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
## 2021-10-13 ENCOUNTER — Ambulatory Visit: Payer: Medicaid Other | Admitting: Cardiovascular Disease

## 2021-10-19 ENCOUNTER — Ambulatory Visit (INDEPENDENT_AMBULATORY_CARE_PROVIDER_SITE_OTHER): Payer: Medicaid Other | Admitting: Primary Care

## 2021-10-19 ENCOUNTER — Encounter: Payer: Self-pay | Admitting: Primary Care

## 2021-10-19 VITALS — BP 124/76 | HR 89 | Temp 98.0°F | Ht 64.0 in | Wt 188.4 lb

## 2021-10-19 DIAGNOSIS — J209 Acute bronchitis, unspecified: Secondary | ICD-10-CM

## 2021-10-19 DIAGNOSIS — Z720 Tobacco use: Secondary | ICD-10-CM | POA: Diagnosis not present

## 2021-10-19 DIAGNOSIS — J441 Chronic obstructive pulmonary disease with (acute) exacerbation: Secondary | ICD-10-CM | POA: Diagnosis not present

## 2021-10-19 DIAGNOSIS — J029 Acute pharyngitis, unspecified: Secondary | ICD-10-CM

## 2021-10-19 MED ORDER — PREDNISONE 10 MG PO TABS: ORAL_TABLET | ORAL | 0 refills | Status: DC

## 2021-10-19 MED ORDER — METHYLPREDNISOLONE ACETATE 80 MG/ML IJ SUSP: 80.0000 mg | Freq: Once | INTRAMUSCULAR | Status: DC

## 2021-10-19 MED ORDER — METHYLPREDNISOLONE ACETATE 80 MG/ML IJ SUSP
80.0000 mg | Freq: Once | INTRAMUSCULAR | Status: AC
  Administered 2021-10-19: 80 mg via INTRAMUSCULAR

## 2021-10-19 MED ORDER — LEVOFLOXACIN 500 MG PO TABS: 500.0000 mg | ORAL_TABLET | Freq: Every day | ORAL | 0 refills | Status: DC

## 2021-10-19 NOTE — Assessment & Plan Note (Addendum)
-   Patient developed productive cough with associated chest tightness 3 weeks ago. CT chest imaging in March 2023 showed resolution right sided pneumonia, peripheral interstitial and ground glass densities bilaterally likely inflammatory. She has scattered rhonchi and wheezing on exam. Given depo-medrol '80mg'$  IM x1 in office and sending in RX Levaquin '500mg'$  qd x 7 days along with prednisone taper. Advised she take mucinex-dm '1200mg'$  twice daily for the next week. Asked patient to provide sputum sample if able. FU if 1 week if symptoms do not improve. She has visit scheduled in June with Dr. Lamonte Sakai.  ?

## 2021-10-19 NOTE — Patient Instructions (Addendum)
Recommendations: ?Use flutter valve three times a day ?Continue mucinex '1200mg'$  twice a day (with a full glass of water) ?Continue Symbicort and Spiriva ? ?Office treatment: ?Depo-medrol '80mg'$  IM x 1 (ordered) ? ?RX: ?Levaquin '500mg'$  daily x 7 days  ?Pred taper as directed- start tomorrow  ? ?Follow-up: ?1 week with Beth  ?

## 2021-10-19 NOTE — Assessment & Plan Note (Signed)
-   Patient quit smoking in May 2023. Congratulated on efforts. Continue to encourage abstinence from smoking. ?

## 2021-10-19 NOTE — Progress Notes (Signed)
? ?@Patient  ID: Adriana Hall, female    DOB: 27-Jan-1971, 51 y.o.   MRN: [id number] ? ?Chief Complaint  ?Patient presents with  ? Acute Visit  ?  Increased cough/ chest tightness/ loss of voice   ? ? ?Referring provider: ?No ref. provider found ? ?HPI: ?51 year old female, current every day smoker (60 pack year hx). PMH significant for HTN, CAD, AV regurgitation, diastolic heart failure, COPD, chronic respiratory failure, tobacco abuse.  Admitted in August 2022 for severe right sided pneumonia with parapneumonic effusion. Cytology negative. Patient of Dr. Lamonte Sakai, last seen on 09/08/21. ? ?Pulmonary function testing in January 2023 showed moderate restriction without BD response. 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 ? ?Previous [facility] pulmonary encounter:  ?06/28/2021 ?Patient presents today for follow-up. She has canceled/no-showed several apt with Dr. Lamonte Sakai. She was seen on in December 2022 by NP where she was treated for acute exacerbation COPD with zpack and prednisone taper. She was also given samples of trelegy 154mg one puff daily. She completed PFTs in January 2023 which showed moderate restriction without BD response. Feels Trelegy helped her breathing but does inhaler does not last all day. Still wheezing. She needs to use her albuterol rescue inhaler twice in the evening. She still has cough with dark brown/benign mucus. She took mucinex for 3 weeks without improvement in cough. She continues to smoke 1ppd. No hemoptysis or substantial weight loss.  ? ?ROV 09/08/21 -Larene Beachreturns for follow-up.  She is 89 active smoker (50+ pack years).  I have been following her for COPD following admission for severe right-sided pneumonia, associated parapneumonic effusion (cytology negative).  She had scattered groundglass infiltrates, question RB-ILD.  Most recent CT showed that the right upper lobe opacity was decreasing in size,  groundglass infiltrates improving.  We repeated imaging as below.  She remains on oxygen at 2 L/min.  Because has been a barrier to getting on stable BD therapy-most recently ordered Symbicort and Spiriva. She benefited from these. She lost her insurance, has not been on her BP regimen for years.  ? ?PET/super D CT 09/01/2021 reviewed by me shows almost complete resolution of the 4.1 cm right subpleural masslike lesion with only residual bandlike scar, no hypermetabolism.  Mild right paratracheal and subcarinal adenopathy.  Peripheral interstitial and groundglass densities bilaterally likely inflammatory.  6 mm left lower lobe pulmonary nodule is not hypermetabolic, has been present and stable since 2012. ? ?11/17/2020: ACE level 41, RF 11.5, CCP 5, ANA negative, ANCA negative, CRP 8.0 (elevated), ESR 50 ? ? ?10/19/2021- Acute OV/cough  ?Patient presents today for acute office visit.  She reports increased cough and chest tightness for the last 3 weeks. Her cough was productive, sputum changed color from light tan to green. Cough is now tight and minimally productive. She has been taking mucinex-dm 6036mtwice daily. Maintained on Spiriva and Symbicort. She has been having to use albuterol rescue inhaler more the last few weeks. Wearing 2L oxygen 24/7. Sputum sample was given in February 2023 but it was contaminated. She has been asked to give follow-up sample. She quit smoking 1 week ago, she is using nicotine patches and gum. She has an apt with cardiology tomorrow. ? ?PET imaging in March 2023 showed improvement in subpleural masslike density RML favoring benign etiology.  Stable 6 mm left lower lobe nodule not not hypermetabolic present since 1014/97/0263 Coarse bilateral subpleural interstitial attenuation at lung bases, probably from  fibrosis.  Patchy groundglass densities in lungs.  Airway thickening suggesting bronchitis or reactive airway disease. ? ? ?TEST/EVENTS:  ?11/2020 CT chest: Diffuse patchy groundglass  airspace opacities with loculated small right pleural effusion.  Questionable pulmonary/pleural mass along the right middle lobe.  Associated right hilar and mediastinal lymphadenopathy.  Findings concerning for malignancy with superimposed infection.  Pulmonary hypertension.  Asymmetric pleural thickening of involving only the right hemothorax.  GGO in both lungs, somewhat sparing the bases, but extending to the pleural.  Some areas of traction bronchiectasis in upper lobes, but no obvious fibrosis. ?11/16/2020 pleural fluid: No malignant cells ?11/18/2020 echocardiogram: LV EF 60-65%, G1 DD, normal RV size and function.  Normal RA and LA.  IVC normal size and variability.  Trivial MR otherwise normal values. ?01/14/2021 CXR 2 view: Bibasilar pulmonary infiltrates again identified.  Similar to previous exam.  Upper lungs clear.  No pleural effusion or pneumothorax ? ? ?Allergies  ?Allergen Reactions  ? Biotin   ?  'Makes me extremely sick'  ? Doxycycline Nausea And Vomiting  ? ? ?Immunization History  ?Administered Date(s) Administered  ? Influenza-Unspecified 05/15/2021  ? Pneumococcal Polysaccharide-23 10/22/2013, 12/28/2019  ? ? ?Past Medical History:  ?Diagnosis Date  ? Anxiety   ? 10 years  ? Arrhythmia   ? 5 years  ? Bipolar 1 disorder, depressed ([place])   ? Bronchitis   ? Colitis 03-01-2011  ? Colonoscopy  ? Depression   ? 10 years  ? Headache(784.0)   ? Chronic for 30 yrs  ? Hemorrhoids 03-01-2011  ? Colonoscopy   ? Hypertension   ? ? ?Tobacco History: ?Social History  ? ?Tobacco Use  ?Smoking Status Former  ? Packs/day: 1.00  ? Years: 30.00  ? Pack years: 30.00  ? Types: Cigarettes  ?Smokeless Tobacco Never  ?Tobacco Comments  ? Pt stopped smoking last week ARJ, RN 10/19/21  ? ?Counseling given: Not Answered ?Tobacco comments: Pt stopped smoking last week ARJ, RN 10/19/21 ? ? ?Outpatient Medications Prior to Visit  ?Medication Sig Dispense Refill  ? acetaminophen (TYLENOL) 325 MG tablet Take 2 tablets (650 mg  total) by mouth every 6 (six) hours as needed for mild pain (or Fever >/= 101). 30 tablet 0  ? albuterol (VENTOLIN HFA) 108 (90 Base) MCG/ACT inhaler Inhale 2 puffs into the lungs every 6 (six) hours as needed for wheezing or shortness of breath. 8 g 2  ? ALPRAZolam (XANAX) 0.5 MG tablet Take 0.5 mg by mouth as needed.    ? benzonatate (TESSALON) 100 MG capsule Take 1 capsule (100 mg total) by mouth 3 (three) times daily as needed for cough. 30 capsule 1  ? budesonide-formoterol (SYMBICORT) 160-4.5 MCG/ACT inhaler Inhale 2 puffs into the lungs in the morning and at bedtime. 1 each 6  ? buprenorphine-naloxone (SUBOXONE) 8-2 mg SUBL SL tablet Place 1 tablet under the tongue in the morning and at bedtime.    ? diphenhydrAMINE (BENADRYL) 25 MG tablet Take 50 mg by mouth at bedtime.    ? ferrous sulfate 325 (65 FE) MG tablet Take 1 tablet (325 mg total) by mouth daily with breakfast. 30 tablet 0  ? fluticasone (FLONASE) 50 MCG/ACT nasal spray Place 1 spray into both nostrils daily. 18.2 mL 2  ? fluvoxaMINE (LUVOX) 100 MG tablet Take 1 tablet (100 mg total) by mouth at bedtime. 30 tablet 2  ? folic acid (FOLVITE) 1 MG tablet Take 1 tablet (1 mg total) by mouth daily. 30 tablet 0  ?  gabapentin (NEURONTIN) 400 MG capsule Take 1 capsule (400 mg total) by mouth 3 (three) times daily. 90 capsule 2  ? hydrOXYzine (ATARAX) 25 MG tablet Take 1 tablet (25 mg total) by mouth 3 (three) times daily as needed for anxiety. 90 tablet 2  ? ibuprofen (ADVIL) 200 MG tablet Take 400 mg by mouth every 6 (six) hours as needed for fever or moderate pain.    ? ipratropium-albuterol (DUONEB) 0.5-2.5 (3) MG/3ML SOLN Take 3 mLs by nebulization every 6 (six) hours as needed. 360 mL 12  ? nicotine (NICODERM CQ - DOSED IN MG/24 HOURS) 21 mg/24hr patch Place 1 patch (21 mg total) onto the skin daily. 28 patch 0  ? nitroGLYCERIN (NITROLINGUAL) 0.4 MG/SPRAY spray Place 1 spray under the tongue once as needed for chest pain. 12 g 0  ? Nutritional  Supplements (ESTROVEN PO) Take 1 tablet by mouth daily.    ? OLANZapine (ZYPREXA) 10 MG tablet Take 1 tablet (10 mg total) by mouth at bedtime. 30 tablet 2  ? omeprazole (PRILOSEC) 40 MG capsule Take 40 mg by mouth dai

## 2021-10-20 ENCOUNTER — Ambulatory Visit: Payer: Medicaid Other | Admitting: Cardiovascular Disease

## 2021-10-21 NOTE — Telephone Encounter (Signed)
Pt had recent OV with BW 10/19/21 for COPD exacerbation. Closing encounter.

## 2021-10-25 ENCOUNTER — Telehealth: Payer: Self-pay | Admitting: Primary Care

## 2021-10-25 MED ORDER — SPIRIVA RESPIMAT 2.5 MCG/ACT IN AERS: 2.0000 | INHALATION_SPRAY | Freq: Every day | RESPIRATORY_TRACT | 3 refills | Status: DC

## 2021-10-25 NOTE — Telephone Encounter (Signed)
Refills sent in. Inhaler of 1.25 Sprivia removed from med list. Nothing further needed

## 2021-10-25 NOTE — Telephone Encounter (Signed)
Should stay on 2.22mg daily, looks like this was sent in April. Please remove 1.25m dose

## 2021-10-25 NOTE — Telephone Encounter (Signed)
Called patient and she is wanting to know what Sprivia that she needs to be taking.  She states that she was given Sprivia 2.5 once time then she got Sprivia 1.25.   I told her I would confirm with Beth and get back with her and send in the refill.   Beth please advise

## 2021-10-26 ENCOUNTER — Other Ambulatory Visit (HOSPITAL_COMMUNITY): Payer: Self-pay | Admitting: Physician Assistant

## 2021-10-26 DIAGNOSIS — F419 Anxiety disorder, unspecified: Secondary | ICD-10-CM

## 2021-10-28 ENCOUNTER — Telehealth: Payer: Self-pay | Admitting: Primary Care

## 2021-10-28 NOTE — Telephone Encounter (Signed)
Left message for patient to call back  

## 2021-11-02 MED ORDER — LEVOFLOXACIN 500 MG PO TABS: 500.0000 mg | ORAL_TABLET | Freq: Every day | ORAL | 0 refills | Status: DC

## 2021-11-02 MED ORDER — PREDNISONE 10 MG PO TABS: ORAL_TABLET | ORAL | 0 refills | Status: DC

## 2021-11-02 NOTE — Telephone Encounter (Signed)
I called and spoke with the pt and notified of response per [facility]. She verbalized understanding. I have sent in rxs. Nothing further needed.

## 2021-11-02 NOTE — Telephone Encounter (Signed)
I called and spoke with the pt  She is c/o increased SOB, wheezing, cough- non prod for the past 2 days  She states that she is still taking her mucinex bid, spiriva, symbicort, tessaloin, ventolin, flonase  She states felt better while on pred and abx  Her voice was very hoarse She has appt with 11/09/21  Wants something sent in   Please advise, thanks!  Allergies  Allergen Reactions   Biotin     'Makes me extremely sick'   Doxycycline Nausea And Vomiting

## 2021-11-02 NOTE — Telephone Encounter (Signed)
Please send in additional Levaquin '500mg'$  x 3 days and repeat prednisone taper

## 2021-11-02 NOTE — Telephone Encounter (Signed)
Patient is returning phone call. Patient phone number is [phone].

## 2021-11-09 ENCOUNTER — Ambulatory Visit (INDEPENDENT_AMBULATORY_CARE_PROVIDER_SITE_OTHER): Payer: Medicaid Other | Admitting: Emergency Medicine

## 2021-11-09 ENCOUNTER — Encounter: Payer: Self-pay | Admitting: Emergency Medicine

## 2021-11-09 DIAGNOSIS — J411 Mucopurulent chronic bronchitis: Secondary | ICD-10-CM | POA: Diagnosis not present

## 2021-11-09 DIAGNOSIS — F17210 Nicotine dependence, cigarettes, uncomplicated: Secondary | ICD-10-CM

## 2021-11-09 DIAGNOSIS — R9389 Abnormal findings on diagnostic imaging of other specified body structures: Secondary | ICD-10-CM | POA: Diagnosis not present

## 2021-11-09 DIAGNOSIS — B370 Candidal stomatitis: Secondary | ICD-10-CM | POA: Insufficient documentation

## 2021-11-09 DIAGNOSIS — Z720 Tobacco use: Secondary | ICD-10-CM

## 2021-11-09 MED ORDER — BUPROPION HCL ER (SR) 150 MG PO TB12: ORAL_TABLET | ORAL | 0 refills | Status: DC

## 2021-11-09 MED ORDER — NYSTATIN 100000 UNIT/ML MT SUSP
5.0000 mL | Freq: Four times a day (QID) | OROMUCOSAL | 0 refills | Status: DC
Start: 2021-11-09 — End: 2022-03-15

## 2021-11-09 MED ORDER — FLUCONAZOLE 100 MG PO TABS: 100.0000 mg | ORAL_TABLET | Freq: Every day | ORAL | 0 refills | Status: DC

## 2021-11-09 NOTE — Assessment & Plan Note (Signed)
Continue your inhaled medication as you have been taking it. °

## 2021-11-09 NOTE — Patient Instructions (Addendum)
We talked today about smoking cessation.  I am glad that you are interested in stopping. You can use nicotine patches or gum to help with cravings. We will give you a prescription for Wellbutrin to start 1 week before your planned quit date.  Take this as directed leading up to the stop date. We also talked about strategies to help you quit. Continue your inhaled medication as you have been taking it. Please take fluconazole 100 mg daily for 5 days Please use nystatin swish and swallow 2 times daily for 3 days. Please call us if you continue to have white plaques in your mouth or on the back of your throat.  If so we will refer you to see ENT. We can talk about the timing of a repeat CT scan of the chest at your next visit.  We will probably do this at the 1 year mark which would be March 2024. Follow with Adriana Hall in 3 months or sooner if you have any problems.

## 2021-11-09 NOTE — Assessment & Plan Note (Signed)
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  Likely repeat her CT chest at the 1 year mark which will be March 2024.

## 2021-11-09 NOTE — Progress Notes (Signed)
Subjective:    Patient ID: Adriana Hall, female    DOB: [birth year], 51 y.o.   MRN: [id number]  HPI  ROV 09/08/21 --Adriana Hall returns for follow-up.  She is 51, active smoker (50+ pack years).  I have been following her for COPD following admission for severe right-sided pneumonia, associated parapneumonic effusion (cytology negative).  She had scattered groundglass infiltrates, question RB-ILD.  Most recent CT showed that the right upper lobe opacity was decreasing in size, groundglass infiltrates improving.  We repeated imaging as below.  She remains on oxygen at 2 L/min.  Because has been a barrier to getting on stable BD therapy-most recently ordered Symbicort and Spiriva. She benefited from these. She lost her insurance, has not been on her BP regimen for years.   PET/super D CT 09/01/2021 reviewed by me shows almost complete resolution of the 4.1 cm right subpleural masslike lesion with only residual bandlike scar, no hypermetabolism.  Mild right paratracheal and subcarinal adenopathy.  Peripheral interstitial and groundglass densities bilaterally likely inflammatory.  6 mm left lower lobe pulmonary nodule is not hypermetabolic, has been present and stable since 2012.  11/17/2020: ACE level 41, RF 11.5, CCP 5, ANA negative, ANCA negative, CRP 8.0 (elevated), ESR 50  ROV 11/09/21 --51 year old active smoker (50+ pack years) with COPD, prior severe right-sided pneumonia with associated parapneumonic effusion.  Also with scattered groundglass pulmonary infiltrates, question RB ILD.  Her most recent chest imaging was reassuring, consistent with resolving pneumonia.  She was seen in mid May for increased cough, chest tightness, sputum production.  Treated for an acute exacerbation of COPD with levofloxacin, prednisone. She returns today reporting that she is feeling better - has residual hoarse, has seen white plaquing in her throat    Review of Systems As per HPI  Past Medical History:  Diagnosis  Date   Anxiety    10 years   Arrhythmia    5 years   Bipolar 1 disorder, depressed ([place])    Bronchitis    Colitis 03-01-2011   Colonoscopy   Depression    10 years   Headache(784.0)    Chronic for 30 yrs   Hemorrhoids 03-01-2011   Colonoscopy    Hypertension      Family History  Problem Relation Age of Onset   Pancreatic cancer Maternal Grandfather    Liver cancer Maternal Grandfather    Clotting disorder Mother    Heart disease Mother    Diabetes Paternal Grandmother        And PGF   Other Father        Brain tumor   Colon cancer Neg Hx      Social History   Socioeconomic History   Marital status: Married    Spouse name: Not on file   Number of children: 3   Years of education: Not on file   Highest education level: Not on file  Occupational History   Occupation: Self employed  Tobacco Use   Smoking status: Former    Packs/day: 1.00    Years: 30.00    Pack years: 30.00    Types: Cigarettes   Smokeless tobacco: Never   Tobacco comments:    10 cigarettes smoked daily ARJ 11/09/21  Vaping Use   Vaping Use: Former   Devices: tried vaping once  Substance and Sexual Activity   Alcohol use: Yes    Alcohol/week: 4.0 standard drinks    Types: 4 Cans of beer per week   Drug use: Yes  Types: Benzodiazepines, Other-see comments, Marijuana    Comment: oxycontin - no longer using - went to rehab   Sexual activity: Not on file  Other Topics Concern   Not on file  Social History Narrative   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      Home Situation: lives with husband and daughter      Spiritual Beliefs: Christian      Lifestyle: no regular CV exercise; diet is ok            Social Determinants of Radio broadcast assistant Strain: Not on file  Food Insecurity: Not on file  Transportation Needs: Not on file  Physical Activity: Not on file  Stress: Not on file  Social Connections: Not on file  Intimate Partner Violence:  Not on file     Allergies  Allergen Reactions   Biotin     'Makes me extremely sick'   Doxycycline Nausea And Vomiting     Outpatient Medications Prior to Visit  Medication Sig Dispense Refill   acetaminophen (TYLENOL) 325 MG tablet Take 2 tablets (650 mg total) by mouth every 6 (six) hours as needed for mild pain (or Fever >/= 101). 30 tablet 0   albuterol (VENTOLIN HFA) 108 (90 Base) MCG/ACT inhaler Inhale 2 puffs into the lungs every 6 (six) hours as needed for wheezing or shortness of breath. 8 g 2   ALPRAZolam (XANAX) 0.5 MG tablet Take 0.5 mg by mouth as needed.     benzonatate (TESSALON) 100 MG capsule Take 1 capsule (100 mg total) by mouth 3 (three) times daily as needed for cough. 30 capsule 1   budesonide-formoterol (SYMBICORT) 160-4.5 MCG/ACT inhaler Inhale 2 puffs into the lungs in the morning and at bedtime. 1 each 6   buprenorphine-naloxone (SUBOXONE) 8-2 mg SUBL SL tablet Place 1 tablet under the tongue in the morning and at bedtime.     diphenhydrAMINE (BENADRYL) 25 MG tablet Take 50 mg by mouth at bedtime.     ferrous sulfate 325 (65 FE) MG tablet Take 1 tablet (325 mg total) by mouth daily with breakfast. 30 tablet 0   fluticasone (FLONASE) 50 MCG/ACT nasal spray Place 1 spray into both nostrils daily. 18.2 mL 2   fluvoxaMINE (LUVOX) 100 MG tablet TAKE 1 TABLET BY MOUTH AT BEDTIME FOR 6 DAYS,THEN CONTINUE TAKING 2 TABLETS (100MG) AT BEDTIME 30 tablet 1   folic acid (FOLVITE) 1 MG tablet Take 1 tablet (1 mg total) by mouth daily. 30 tablet 0   gabapentin (NEURONTIN) 400 MG capsule Take 1 capsule (400 mg total) by mouth 3 (three) times daily. 90 capsule 2   hydrOXYzine (ATARAX) 25 MG tablet Take 1 tablet (25 mg total) by mouth 3 (three) times daily as needed for anxiety. 90 tablet 2   ibuprofen (ADVIL) 200 MG tablet Take 400 mg by mouth every 6 (six) hours as needed for fever or moderate pain.     ipratropium-albuterol (DUONEB) 0.5-2.5 (3) MG/3ML SOLN Take 3 mLs by  nebulization every 6 (six) hours as needed. 360 mL 12   levofloxacin (LEVAQUIN) 500 MG tablet Take 1 tablet (500 mg total) by mouth daily. 3 tablet 0   nicotine (NICODERM CQ - DOSED IN MG/24 HOURS) 21 mg/24hr patch Place 1 patch (21 mg total) onto the skin daily. 28 patch 0   nitroGLYCERIN (NITROLINGUAL) 0.4 MG/SPRAY spray Place 1 spray under the tongue once as needed for chest pain. 12 g 0  Nutritional Supplements (ESTROVEN PO) Take 1 tablet by mouth daily.     OLANZapine (ZYPREXA) 10 MG tablet Take 1 tablet (10 mg total) by mouth at bedtime. 30 tablet 2   omeprazole (PRILOSEC) 40 MG capsule Take 40 mg by mouth daily.     ondansetron (ZOFRAN) 4 MG tablet TAKE 1 TABLET BY MOUTH EVERY 8 HOURS AS NEEDED FOR NAUSEA AND VOMITING 30 tablet 0   predniSONE (DELTASONE) 10 MG tablet 4 tabs for 2 days, then 3 tabs for 2 days, 2 tabs for 2 days, then 1 tab for 2 days, then stop 20 tablet 0   Tiotropium Bromide Monohydrate (SPIRIVA RESPIMAT) 2.5 MCG/ACT AERS Inhale 2 puffs into the lungs daily. 4 g 3   traZODone (DESYREL) 100 MG tablet TAKE 2 TABLETS (200 MG TOTAL) BY MOUTH AT BEDTIME AS NEEDED FOR SLEEP. 60 tablet 2   vitamin B-12 100 MCG tablet Take 1 tablet (100 mcg total) by mouth daily. 30 tablet 0   Facility-Administered Medications Prior to Visit  Medication Dose Route Frequency Provider Last Rate Last Admin   ipratropium-albuterol (DUONEB) 0.5-2.5 (3) MG/3ML nebulizer solution 3 mL  3 mL Nebulization Once Cobb, Karie Schwalbe, NP       methylPREDNISolone acetate (DEPO-MEDROL) injection 80 mg  80 mg Intramuscular Once Martyn Ehrich, NP             Objective:   Physical Exam Vitals:   11/09/21 1423  BP: 127/76  Pulse: 84  Temp: 97.9 F (36.6 C)  TempSrc: Oral  SpO2: 97%  Weight: 187 lb 6.4 oz (85 kg)  Height: 5' 4"  (1.626 m)   Gen: Pleasant, chronically ill-appearing woman, in no distress,  normal affect  ENT: No lesions,  mouth clear,  oropharynx clear, no postnasal drip,  Neck:  No JVD, no stridor  Lungs: No use of accessory muscles, bilateral inspiratory rhonchi and crackles, no wheeze.   Cardiovascular: RRR, heart sounds normal, no murmur or gallops, trace B ankle peripheral edema  Musculoskeletal: No deformities, no cyanosis or clubbing  Neuro: alert, awake, non focal  Skin: Warm, no lesions or rash      Assessment & Plan:   COPD (chronic obstructive pulmonary disease) ([place]) Continue your inhaled medication as you have been taking it.  Tobacco abuse We talked today about smoking cessation.  I am glad that you are interested in stopping. You can use nicotine patches or gum to help with cravings. We will give you a prescription for Wellbutrin to start 1 week before your planned quit date.  Take this as directed leading up to the stop date. We also talked about strategies to help you quit.   Thrush Please take fluconazole 100 mg daily for 5 days Please use nystatin swish and swallow 2 times daily for 3 days. Please call us if you continue to have white plaques in your mouth or on the back of your throat.  If so we will refer you to see ENT.   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  Likely repeat her CT chest at the 1 year mark which will be March 2024.    Baltazar Apo, MD, PhD 11/09/2021, 2:52 PM [facility] [phone] or if no answer before 7:00PM call [phone] For any issues after 7:00PM please call eLink [phone]

## 2021-11-09 NOTE — Addendum Note (Signed)
Addended by: Gavin Potters R on: 11/09/2021 03:14 PM   Modules accepted: Orders

## 2021-11-09 NOTE — Assessment & Plan Note (Signed)
We talked today about smoking cessation.  I am glad that you are interested in stopping. You can use nicotine patches or gum to help with cravings. We will give you a prescription for Wellbutrin to start 1 week before your planned quit date.  Take this as directed leading up to the stop date. We also talked about strategies to help you quit.

## 2021-11-09 NOTE — Assessment & Plan Note (Signed)
Please take fluconazole 100 mg daily for 5 days Please use nystatin swish and swallow 2 times daily for 3 days. Please call us if you continue to have white plaques in your mouth or on the back of your throat.  If so we will refer you to see ENT.

## 2021-12-08 ENCOUNTER — Other Ambulatory Visit: Payer: Self-pay | Admitting: Nurse Practitioner

## 2021-12-08 DIAGNOSIS — J441 Chronic obstructive pulmonary disease with (acute) exacerbation: Secondary | ICD-10-CM

## 2021-12-22 ENCOUNTER — Telehealth: Payer: Self-pay | Admitting: Nurse Practitioner

## 2021-12-22 NOTE — Telephone Encounter (Signed)
Message sent to [facility] from [employer] to pull office notes for patient for oxygen order. Nothing further needed

## 2021-12-28 ENCOUNTER — Other Ambulatory Visit: Payer: Self-pay | Admitting: Emergency Medicine

## 2021-12-28 ENCOUNTER — Telehealth: Payer: Self-pay | Admitting: Emergency Medicine

## 2021-12-29 NOTE — Telephone Encounter (Signed)
Message sent to [employer]. Waiting on response.

## 2022-02-09 ENCOUNTER — Ambulatory Visit: Payer: Medicaid Other | Admitting: Emergency Medicine

## 2022-02-14 ENCOUNTER — Telehealth (HOSPITAL_COMMUNITY): Payer: Self-pay

## 2022-02-14 NOTE — Telephone Encounter (Signed)
Pt called asking for refill on prescription. Pt was advised via VM that she will need an appointment before receiving refill. She was to be seen 3 months after her last appt.

## 2022-03-15 ENCOUNTER — Ambulatory Visit (INDEPENDENT_AMBULATORY_CARE_PROVIDER_SITE_OTHER): Payer: Medicaid Other | Admitting: Nurse Practitioner

## 2022-03-15 ENCOUNTER — Ambulatory Visit (INDEPENDENT_AMBULATORY_CARE_PROVIDER_SITE_OTHER): Payer: Medicaid Other

## 2022-03-15 ENCOUNTER — Encounter: Payer: Self-pay | Admitting: Nurse Practitioner

## 2022-03-15 VITALS — BP 130/80 | HR 84 | Temp 98.2°F | Ht 64.0 in | Wt 187.8 lb

## 2022-03-15 DIAGNOSIS — B370 Candidal stomatitis: Secondary | ICD-10-CM

## 2022-03-15 DIAGNOSIS — J441 Chronic obstructive pulmonary disease with (acute) exacerbation: Secondary | ICD-10-CM

## 2022-03-15 DIAGNOSIS — J9611 Chronic respiratory failure with hypoxia: Secondary | ICD-10-CM | POA: Diagnosis not present

## 2022-03-15 DIAGNOSIS — R490 Dysphonia: Secondary | ICD-10-CM

## 2022-03-15 DIAGNOSIS — J181 Lobar pneumonia, unspecified organism: Secondary | ICD-10-CM

## 2022-03-15 MED ORDER — AMOXICILLIN-POT CLAVULANATE 875-125 MG PO TABS: 1.0000 | ORAL_TABLET | Freq: Two times a day (BID) | ORAL | 0 refills | Status: AC

## 2022-03-15 MED ORDER — NYSTATIN 100000 UNIT/ML MT SUSP: 5.0000 mL | Freq: Four times a day (QID) | OROMUCOSAL | 0 refills | Status: AC

## 2022-03-15 MED ORDER — METHYLPREDNISOLONE ACETATE 80 MG/ML IJ SUSP: 80.0000 mg | Freq: Once | INTRAMUSCULAR | Status: DC

## 2022-03-15 MED ORDER — FLUCONAZOLE 200 MG PO TABS: 200.0000 mg | ORAL_TABLET | Freq: Every day | ORAL | 0 refills | Status: AC

## 2022-03-15 MED ORDER — ALBUTEROL SULFATE (2.5 MG/3ML) 0.083% IN NEBU
2.5000 mg | INHALATION_SOLUTION | Freq: Once | RESPIRATORY_TRACT | Status: AC
  Administered 2022-03-15: 2.5 mg via RESPIRATORY_TRACT

## 2022-03-15 MED ORDER — AEROCHAMBER MV MISC: 2 refills | Status: AC

## 2022-03-15 MED ORDER — PREDNISONE 10 MG PO TABS: ORAL_TABLET | ORAL | 0 refills | Status: DC

## 2022-03-15 NOTE — Progress Notes (Signed)
$'@Patient'p$  ID: Adriana Hall, female    DOB: 1971/05/03, 51 y.o.   MRN: [id number]  Chief Complaint  Patient presents with   Acute Visit    Thrush off and on from inhalers.  Sob worse sitting and with exertion.  Feels like she has bronchitis (yellowish/green) for 3-4 weeks.  No recent cxr.  No increase in oxygen demands.  Coughs at night and interrupts sleep.  Temperature of 2wks ago of 100.2 (lasted for 4 days, had pink eye went to ucc). 99.5 with ibuprofen.  No chills/body aches.  No sick contacts.  Negative covid test 2 weeks ago, none since then.    Referring provider: No ref. provider found  HPI: 51 year old female, current smoker (60-pack-year history) followed for COPD, chronic respiratory failure with hypoxia on home oxygen, tobacco abuse.she is a patient of Dr. Agustina Caroli and was last seen in office 11/09/2021.  Hospitalized in 11/2020 for multifocal pneumonia and acute respiratory failure. Past medical history significant for hypertension, CAD, diastolic CHF, AV regurg, GERD, hypertriglyceridemia, anxiety, alcohol use disorder, bipolar.   TEST/EVENTS:  11/2020 CT chest: Diffuse patchy groundglass airspace opacities with loculated small right pleural effusion.  Questionable pulmonary/pleural mass along the right middle lobe.  Associated right hilar and mediastinal lymphadenopathy.  Findings concerning for malignancy with superimposed infection.  Pulmonary hypertension.  Asymmetric pleural thickening of involving only the right hemothorax.  GGO in both lungs, somewhat sparing the bases, but extending to the pleural.  Some areas of traction bronchiectasis in upper lobes, but no obvious fibrosis. 11/16/2020 pleural fluid: No malignant cells 11/18/2020 echocardiogram: LV EF 60-65%, G1 DD, normal RV size and function.  Normal RA and LA.  IVC normal size and variability.  Trivial MR otherwise normal values. 01/14/2021 CXR 2 view: Bibasilar pulmonary infiltrates again identified.  Similar to previous  exam.  Upper lungs clear.  No pleural effusion or pneumothorax 05/07/2021 CT chest without contrast: There is a right middle lobe mass, measuring 4.1 x 3.3 x 3.2 cm which is substantially reduced in size compared to 11/12/2020.  Malignancy is not excluded.  PET scan recommended for further evaluation.  There is also some mild paratracheal and subcarinal adenopathy.  Paraseptal emphysema is present with coarse peripheral interstitial attenuation, localized left lower lobe bronchiectasis and some hazy peripheral groundglass opacities in the lungs; substantially improved compared to June 2022.  Several small nodules and some tree-in-bud nodularity is present as well. 06/24/2021 PFT: FVC 64, FEV1 72, ratio 88, TLC 79, DLCOcor 67 09/01/2021 PET scan: Mass in the right middle lobe has nearly completely resolved.  No hypermetabolic activity.  The mildly enlarged subcarinal and right lower paratracheal lymph nodes are mildly hypermetabolic.   09/01/2021 super D CT: As mentioned in the PET scan read there is marked improvement in the masslike density in the right middle lobe with only a residual bandlike scar.  There is a stable 6 mm left lower lobe nodule which is not hypermetabolic and has been stable since 2012.  There is coarse bilateral subpleural interstitial extenuation, probably from fibrosis.  There is some airway thickening, suggesting bronchitis or reactive airways disease.  09/08/2021: OV with Dr. Lamonte Sakai.  Followed for COPD following admission for severe right-sided pneumonia, associated parapneumonic effusion (cytology negative).  She had scattered groundglass infiltrates, question RB-ILD.  Most recent CT showed that the right upper lobe opacity was decreasing in size, groundglass infiltrates improving.  PET scan with almost complete resolution of the 4.1 cm right subpleural masslike lesion with only residual  bandlike scarring and no hypermetabolism.  There was a 6 mm left lower lobe pulmonary nodule which was also  not hypermetabolic and stable compared to 2012.  Interstitial and groundglass densities bilaterally suspected to be inflammatory.  There have been barriers to getting on stable bronchodilator therapy.  Most recently ordered Symbicort and Spiriva.  She benefited from these.  She is lost her insurance.  11/09/2021: OV with Dr. Lamonte Sakai.  Most recent chest imaging was reassuring, consistent with resolving pneumonia.  She was seen in mid May for increased cough, chest tightness, sputum production and treated with Levaquin and prednisone for AECOPD.  Feels like she is feeling better.  Does have some residual hoarseness and white plaquing on her throat.  Treated for thrush with fluconazole and nystatin.  Discussed that if she continues to have trouble with this, further referral to ENT is warranted.  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  Likely repeat CT chest at the 1 year mark which will be March 2024.  Started on Wellbutrin for smoking cessation.  Continued on Symbicort and Spiriva for COPD.  03/15/2022: Today-acute Patient presents today for acute visit.  She has been having a increased cough for the last 3 to 4 weeks.  She was seen at urgent care on 03/04/2022.  Note reviewed today.  She was treated for conjunctivitis with ciprofloxacin no mention of her cough or respiratory problems.  Today, she tells me that her shortness of breath has been worse with rest and with exertion.  She is coughing up yellow to green phlegm.  Cough is worse at night and interrupts her sleep.  She's had a lot of nasal congestion/drainage, which is also yellow but sometimes brown. Makes her head hurt at times. She did have a fever around 2 weeks ago 100.2 that lasted for 4 days which prompted her evaluation at urgent care.  She has been taking ibuprofen and Tylenol since and has had not had any recurrence of fevers.  She took a COVID test 2 weeks ago which was  negative.  She reports that she is just feeling terrible.  Feels very fatigued and rundown.  She also been struggling with thrush on and off from inhalers.  She denies any chills, body aches, URI symptoms, hemoptysis, increased O2 requirements. Her eye did improve with ciprofloxacin ointment. She is currently on 2 L.  She is using Symbicort and Spiriva for maintenance for COPD.  She uses Flonase which helps with nasal drainage and she is on Prilosec for GERD with good control.  Allergies  Allergen Reactions   Biotin     'Makes me extremely sick'   Doxycycline Nausea And Vomiting    Immunization History  Administered Date(s) Administered   Influenza-Unspecified 05/15/2021   Pneumococcal Polysaccharide-23 10/22/2013, 12/28/2019    Past Medical History:  Diagnosis Date   Anxiety    10 years   Arrhythmia    5 years   Bipolar 1 disorder, depressed ([place])    Bronchitis    Colitis 03-01-2011   Colonoscopy   Depression    10 years   Headache(784.0)    Chronic for 30 yrs   Hemorrhoids 03-01-2011   Colonoscopy    Hypertension     Tobacco History: Social History   Tobacco Use  Smoking Status Former   Packs/day: 3.00   Years: 30.00   Total pack years: 90.00   Types: Cigarettes  Smokeless Tobacco Never  Tobacco Comments   Trying to quit.  <Half a ppd.  03/15/2022 hfb   Counseling given: Not Answered Tobacco comments: Trying to quit.  <Half a ppd.  03/15/2022 hfb    Outpatient Medications Prior to Visit  Medication Sig Dispense Refill   acetaminophen (TYLENOL) 325 MG tablet Take 2 tablets (650 mg total) by mouth every 6 (six) hours as needed for mild pain (or Fever >/= 101). 30 tablet 0   ALPRAZolam (XANAX) 0.5 MG tablet Take 0.5 mg by mouth as needed.     budesonide-formoterol (SYMBICORT) 160-4.5 MCG/ACT inhaler Inhale 2 puffs into the lungs in the morning and at bedtime. 1 each 6   buprenorphine-naloxone (SUBOXONE) 8-2 mg SUBL SL tablet Place 1 tablet under the tongue in the  morning and at bedtime.     diphenhydrAMINE (BENADRYL) 25 MG tablet Take 50 mg by mouth at bedtime.     ferrous sulfate 325 (65 FE) MG tablet Take 1 tablet (325 mg total) by mouth daily with breakfast. 30 tablet 0   fluticasone (FLONASE) 50 MCG/ACT nasal spray Place 1 spray into both nostrils daily. 18.2 mL 2   fluvoxaMINE (LUVOX) 100 MG tablet TAKE 1 TABLET BY MOUTH AT BEDTIME FOR 6 DAYS,THEN CONTINUE TAKING 2 TABLETS ('100MG'$ ) AT BEDTIME 30 tablet 1   folic acid (FOLVITE) 1 MG tablet Take 1 tablet (1 mg total) by mouth daily. 30 tablet 0   gabapentin (NEURONTIN) 400 MG capsule Take 1 capsule (400 mg total) by mouth 3 (three) times daily. 90 capsule 2   hydrOXYzine (ATARAX) 25 MG tablet Take 1 tablet (25 mg total) by mouth 3 (three) times daily as needed for anxiety. 90 tablet 2   ibuprofen (ADVIL) 200 MG tablet Take 400 mg by mouth every 6 (six) hours as needed for fever or moderate pain.     ipratropium-albuterol (DUONEB) 0.5-2.5 (3) MG/3ML SOLN Take 3 mLs by nebulization every 6 (six) hours as needed. 360 mL 12   nicotine (NICODERM CQ - DOSED IN MG/24 HOURS) 21 mg/24hr patch Place 1 patch (21 mg total) onto the skin daily. 28 patch 0   nitroGLYCERIN (NITROLINGUAL) 0.4 MG/SPRAY spray Place 1 spray under the tongue once as needed for chest pain. 12 g 0   Nutritional Supplements (ESTROVEN PO) Take 1 tablet by mouth daily.     OLANZapine (ZYPREXA) 10 MG tablet Take 1 tablet (10 mg total) by mouth at bedtime. 30 tablet 2   omeprazole (PRILOSEC) 40 MG capsule Take 40 mg by mouth daily.     ondansetron (ZOFRAN) 4 MG tablet TAKE 1 TABLET BY MOUTH EVERY 8 HOURS AS NEEDED FOR NAUSEA AND VOMITING 30 tablet 0   Tiotropium Bromide Monohydrate (SPIRIVA RESPIMAT) 2.5 MCG/ACT AERS Inhale 2 puffs into the lungs daily. 4 g 3   traZODone (DESYREL) 100 MG tablet TAKE 2 TABLETS (200 MG TOTAL) BY MOUTH AT BEDTIME AS NEEDED FOR SLEEP. 60 tablet 2   VENTOLIN HFA 108 (90 Base) MCG/ACT inhaler TAKE 2 PUFFS BY MOUTH EVERY  6 HOURS AS NEEDED FOR WHEEZE OR SHORTNESS OF BREATH 18 each 2   vitamin B-12 100 MCG tablet Take 1 tablet (100 mcg total) by mouth daily. 30 tablet 0   nystatin (MYCOSTATIN) 100000 UNIT/ML suspension Take 5 mLs (500,000 Units total) by mouth 4 (four) times daily. 60 mL 0   benzonatate (TESSALON) 100 MG capsule Take 1 capsule (100 mg total) by mouth 3 (three) times daily as needed for cough. (Patient not taking: Reported on  03/15/2022) 30 capsule 1   buPROPion (WELLBUTRIN SR) 150 MG 12 hr tablet Take 1 tablet (150 mg total) by mouth daily for 7 days, THEN 1 tablet (150 mg total) in the morning and at bedtime. 105 tablet 0   fluconazole (DIFLUCAN) 100 MG tablet Take 1 tablet (100 mg total) by mouth daily. (Patient not taking: Reported on 03/15/2022) 5 tablet 0   levofloxacin (LEVAQUIN) 500 MG tablet Take 1 tablet (500 mg total) by mouth daily. (Patient not taking: Reported on 03/15/2022) 3 tablet 0   predniSONE (DELTASONE) 10 MG tablet 4 tabs for 2 days, then 3 tabs for 2 days, 2 tabs for 2 days, then 1 tab for 2 days, then stop (Patient not taking: Reported on 03/15/2022) 20 tablet 0   Facility-Administered Medications Prior to Visit  Medication Dose Route Frequency Provider Last Rate Last Admin   ipratropium-albuterol (DUONEB) 0.5-2.5 (3) MG/3ML nebulizer solution 3 mL  3 mL Nebulization Once Adriana Hall, Karie Schwalbe, NP       methylPREDNISolone acetate (DEPO-MEDROL) injection 80 mg  80 mg Intramuscular Once Martyn Ehrich, NP         Review of Systems:   Constitutional: No weight loss or gain, night sweats, fevers. +fatigue, chills, anorexia HEENT: +dental decay, nasal congestion, postnasal drip, rhinorrhea. No headaches, difficulty swallowing, or sore throat. No sneezing, itching, ear ache CV:  No chest pain, orthopnea, PND, swelling in lower extremities, anasarca, dizziness, palpitations, syncope Resp: +shortness of breath with exertion and at rest; productive cough with increased yellow/green  sputum; wheezing. No chest wall deformity. No hemoptysis. Skin: No rash, lesions, ulcerations MSK:  No joint pain or swelling.  No decreased range of motion.  No back pain. Neuro: No dizziness or lightheadedness.  Psych: No depression or anxiety. Mood stable.     Physical Exam:  BP 130/80 (BP Location: Right Arm, Patient Position: Sitting, Cuff Size: Large)   Pulse 84   Temp 98.2 F (36.8 C) (Oral)   Ht '5\' 4"'$  (1.626 m)   Wt 187 lb 12.8 oz (85.2 kg)   SpO2 96%   BMI 32.24 kg/m   GEN: Pleasant, interactive, acute-on-chronically ill appearing; non-toxic; in no acute distress. HEENT:  Normocephalic and atraumatic. EACs patent bilaterally. TM pearly gray with present light reflex bilaterally. PERRLA. Sclera white. Nasal turbinates pink, moist and patent bilaterally. Clear rhinorrhea present. Oropharynx erythematous and moist, without exudate or edema. Edentulous with dental decay present. No lesions, ulcerations NECK:  Supple w/ fair ROM. No JVD present. Normal carotid impulses w/o bruits. Thyroid symmetrical with no goiter or nodules palpated. No lymphadenopathy.   CV: RRR, no m/r/g, no peripheral edema. Pulses intact, +2 bilaterally. No cyanosis, pallor or clubbing. PULMONARY:  Unlabored, regular breathing. Rhonchorous breath sounds with scattered wheezes throughout. On 2L O2. No accessory muscle use. No dullness to percussion. GI: BS present and normoactive. Soft, non-tender to palpation. No organomegaly or masses detected. No CVA tenderness. MSK: No erythema, warmth or tenderness. Cap refil <2 sec all extrem. No deformities or joint swelling noted.  Neuro: A/Ox3. No focal deficits noted.   Skin: Warm, no lesions or rashe Psych: Normal affect and behavior. Judgement and thought content appropriate.     Lab Results:  CBC    Component Value Date/Time   WBC 13.8 (H) 11/21/2020 0111   RBC 5.16 (H) 11/21/2020 0111   HGB 11.0 (L) 11/21/2020 0111   HCT 38.3 11/21/2020 0111   PLT 490  (H) 11/21/2020 0111   MCV 74.2 (L)  11/21/2020 0111   MCH 21.3 (L) 11/21/2020 0111   MCHC 28.7 (L) 11/21/2020 0111   RDW 24.9 (H) 11/21/2020 0111   LYMPHSABS 2.7 11/21/2020 0111   MONOABS 1.0 11/21/2020 0111   EOSABS 0.3 11/21/2020 0111   BASOSABS 0.1 11/21/2020 0111    BMET    Component Value Date/Time   NA 136 11/21/2020 0111   K 3.9 11/21/2020 0111   CL 96 (L) 11/21/2020 0111   CO2 32 11/21/2020 0111   GLUCOSE 99 11/21/2020 0111   BUN 9 11/21/2020 0111   CREATININE 0.59 11/21/2020 0111   CREATININE 0.75 08/31/2015 1259   CALCIUM 9.3 11/21/2020 0111   GFRNONAA >60 11/21/2020 0111   GFRNONAA >89 08/31/2015 1259   GFRAA >60 12/26/2019 1645   GFRAA >89 08/31/2015 1259    BNP    Component Value Date/Time   BNP 60.3 11/20/2020 0120     Imaging:  DG Chest 2 View  Result Date: 03/15/2022 CLINICAL DATA:  Productive cough EXAM: CHEST - 2 VIEW COMPARISON:  Multiple chest x-rays, most recently May 07, 2021, PET-CT September 01, 2021 FINDINGS: The cardiomediastinal silhouette is unchanged in contour. Heterogeneous opacity in the right lower lung. In comparison with prior chest x-rays and PET-CT, the right lower lobe opacity is waxing and waning in nature and did not demonstrate hypermetabolic activity. No pleural effusion or pneumothorax. Visualized upper abdomen is unremarkable. No acute osseous abnormality. IMPRESSION: Heterogeneous opacity in the right lower lung. Given the waxing and waning nature, this is most suggestive of aspiration/pneumonia. However, underlying malignancy is not excluded. Recommend repeat chest x-ray versus chest CT without contrast in 4-6 weeks to ensure resolution. Electronically Signed   By: Beryle Flock M.D.   On: 03/15/2022 09:42          Latest Ref Rng & Units 06/24/2021    3:50 PM  PFT Results  FVC-Pre L 2.21   FVC-Predicted Pre % 64   FVC-Post L 2.28   FVC-Predicted Post % 66   Pre FEV1/FVC % % 89   Post FEV1/FCV % % 88   FEV1-Pre L  1.96   FEV1-Predicted Pre % 72   FEV1-Post L 2.00   DLCO uncorrected ml/min/mmHg 13.79   DLCO UNC% % 67   DLCO corrected ml/min/mmHg 13.79   DLCO COR %Predicted % 67   DLVA Predicted % 80   TLC L 3.95   TLC % Predicted % 79   RV % Predicted % 97     No results found for: "NITRICOXIDE"      Assessment & Plan:   COPD with acute exacerbation (HCC) AECOPD, likely related to URI. She does have an opacity in the right lung, which could be residual scarring from previous severe pneumonia or recurrent pna. Non-toxic appearing and VS stable. She was treated with albuterol neb and 80 mg depo inj x 1 in office today. We will treat her with empiric augmentin 10 day course and prednisone taper. I am also going to check sputum cultures on her. Continue triple therapy regimen. Add spacer to symbicort to see if we can help reduce recurrence of thrush. Target mucociliary clearance. Close follow up and strict ED precautions.   Patient Instructions  -Continue Flonase nasal spray 2 sprays each nostril daily until symptoms improve then 1 spray daily -Continue Mucinex DM over the counter Twice daily  -Continue Symbicort 2 puffs Twice daily. Brush tongue and rinse mouth afterwards. Use with spacer -Continue spiriva 2 puffs daily -Continue Albuterol inhaler 2  puffs or 3 mL neb every 6 hours as needed for shortness of breath or wheezing. Notify if symptoms persist despite rescue inhaler/neb use. Use neb up to twice a day until symptoms improve  -Continue omeprazole 1 tab daily  -Continue supplemental oxygen 2 lpm for goal >88-90%  -Saline nasal spray 2-3 times a day -Augmentin 1 tab Twice daily for 10 days. Finish your antibiotics in their entirety. Do not stop just because symptoms improve. Take with food to reduce GI upset.  -Prednisone taper pack. Start tomorrow. 4 tabs for 3 days, then 3 tabs for 3 days, 2 tabs for 3 days, then 1 tab for 3 days, then stop. Take in AM with food -Diflucan 200 mg daily  for 7 days -Nystatin 5 mL four time a day for 10 days. Swish and swallow   Sputum cultures - collect prior to starting antibiotics today   Follow up in one week with Dr. Lamonte Sakai or Roxan Diesel, NP. If symptoms do not improve or worsen, please contact office for sooner follow up or seek emergency care.    Oropharyngeal candidiasis Recurrent thrush. With her lung function, would not want to take her off ICS. We discussed oral hygiene measures. Add spacer to ICS/LABA. We will treat her with diflucan and nystain courses today.   Chronic respiratory failure with hypoxia (HCC) Stable without any increased O2 requirement. Continue supplemental oxygen 2 lpm for goal >88-90%   I spent 45 minutes of dedicated to the care of this patient on the date of this encounter to include pre-visit review of records, face-to-face time with the patient discussing conditions above, post visit ordering of testing, clinical documentation with the electronic health record, making appropriate referrals as documented, and communicating necessary findings to members of the patients care team.  Clayton Bibles, NP 03/15/2022  Pt aware and understands NP's role.

## 2022-03-15 NOTE — Addendum Note (Signed)
Addended by: Vanessa Barbara on: 03/15/2022 01:45 PM   Modules accepted: Orders

## 2022-03-15 NOTE — Patient Instructions (Addendum)
-  Continue Flonase nasal spray 2 sprays each nostril daily until symptoms improve then 1 spray daily -Continue Mucinex DM over the counter Twice daily  -Continue Symbicort 2 puffs Twice daily. Brush tongue and rinse mouth afterwards. Use with spacer -Continue spiriva 2 puffs daily -Continue Albuterol inhaler 2 puffs or 3 mL neb every 6 hours as needed for shortness of breath or wheezing. Notify if symptoms persist despite rescue inhaler/neb use. Use neb up to twice a day until symptoms improve  -Continue omeprazole 1 tab daily  -Continue supplemental oxygen 2 lpm for goal >88-90%  -Saline nasal spray 2-3 times a day -Augmentin 1 tab Twice daily for 10 days. Finish your antibiotics in their entirety. Do not stop just because symptoms improve. Take with food to reduce GI upset.  -Prednisone taper pack. Start tomorrow. 4 tabs for 3 days, then 3 tabs for 3 days, 2 tabs for 3 days, then 1 tab for 3 days, then stop. Take in AM with food -Diflucan 200 mg daily for 7 days -Nystatin 5 mL four time a day for 10 days. Swish and swallow   Sputum cultures - collect prior to starting antibiotics today   Follow up in one week with Dr. Lamonte Sakai or Roxan Diesel, NP. If symptoms do not improve or worsen, please contact office for sooner follow up or seek emergency care.

## 2022-03-15 NOTE — Assessment & Plan Note (Signed)
Stable without any increased O2 requirement. Continue supplemental oxygen 2 lpm for goal >88-90%

## 2022-03-15 NOTE — Assessment & Plan Note (Addendum)
AECOPD, likely related to URI. She does have an opacity in the right lung, which could be residual scarring from previous severe pneumonia or recurrent pna. Non-toxic appearing and VS stable. She was treated with albuterol neb and 80 mg depo inj x 1 in office today. We will treat her with empiric augmentin 10 day course and prednisone taper. I am also going to check sputum cultures on her. Continue triple therapy regimen. Add spacer to symbicort to see if we can help reduce recurrence of thrush. Target mucociliary clearance. Close follow up and strict ED precautions.   Patient Instructions  -Continue Flonase nasal spray 2 sprays each nostril daily until symptoms improve then 1 spray daily -Continue Mucinex DM over the counter Twice daily  -Continue Symbicort 2 puffs Twice daily. Brush tongue and rinse mouth afterwards. Use with spacer -Continue spiriva 2 puffs daily -Continue Albuterol inhaler 2 puffs or 3 mL neb every 6 hours as needed for shortness of breath or wheezing. Notify if symptoms persist despite rescue inhaler/neb use. Use neb up to twice a day until symptoms improve  -Continue omeprazole 1 tab daily  -Continue supplemental oxygen 2 lpm for goal >88-90%  -Saline nasal spray 2-3 times a day -Augmentin 1 tab Twice daily for 10 days. Finish your antibiotics in their entirety. Do not stop just because symptoms improve. Take with food to reduce GI upset.  -Prednisone taper pack. Start tomorrow. 4 tabs for 3 days, then 3 tabs for 3 days, 2 tabs for 3 days, then 1 tab for 3 days, then stop. Take in AM with food -Diflucan 200 mg daily for 7 days -Nystatin 5 mL four time a day for 10 days. Swish and swallow   Sputum cultures - collect prior to starting antibiotics today   Follow up in one week with Dr. Lamonte Sakai or Roxan Diesel, NP. If symptoms do not improve or worsen, please contact office for sooner follow up or seek emergency care.

## 2022-03-15 NOTE — Addendum Note (Signed)
Addended by: Vanessa Barbara on: 03/15/2022 01:52 PM   Modules accepted: Orders

## 2022-03-15 NOTE — Assessment & Plan Note (Signed)
Recurrent thrush. With her lung function, would not want to take her off ICS. We discussed oral hygiene measures. Add spacer to ICS/LABA. We will treat her with diflucan and nystain courses today.

## 2022-03-16 ENCOUNTER — Telehealth: Payer: Self-pay | Admitting: Nurse Practitioner

## 2022-03-16 NOTE — Telephone Encounter (Signed)
Patient's wife call regarding a refill for her Augmentin.  He stated that the pharmacy did not have a script for it when they went to the pharmacy yesterday.  Please advise and call to discuss at [phone]

## 2022-03-16 NOTE — Telephone Encounter (Signed)
Called patient but she did not answer. Left message for her to call us back.  Augmentin was sent to [facility] on [address] yesterday. Need to verify the pharmacy.

## 2022-03-25 ENCOUNTER — Ambulatory Visit: Payer: Medicaid Other | Admitting: Nurse Practitioner

## 2022-03-28 ENCOUNTER — Ambulatory Visit (HOSPITAL_BASED_OUTPATIENT_CLINIC_OR_DEPARTMENT_OTHER): Payer: Medicaid Other | Admitting: Family Medicine

## 2022-04-01 ENCOUNTER — Ambulatory Visit (INDEPENDENT_AMBULATORY_CARE_PROVIDER_SITE_OTHER): Payer: Medicaid Other | Admitting: Nurse Practitioner

## 2022-04-01 ENCOUNTER — Encounter: Payer: Self-pay | Admitting: Nurse Practitioner

## 2022-04-01 ENCOUNTER — Ambulatory Visit (INDEPENDENT_AMBULATORY_CARE_PROVIDER_SITE_OTHER): Payer: Medicaid Other

## 2022-04-01 VITALS — BP 138/82 | HR 81 | Ht 64.0 in | Wt 194.4 lb

## 2022-04-01 DIAGNOSIS — R9389 Abnormal findings on diagnostic imaging of other specified body structures: Secondary | ICD-10-CM

## 2022-04-01 DIAGNOSIS — J441 Chronic obstructive pulmonary disease with (acute) exacerbation: Secondary | ICD-10-CM

## 2022-04-01 DIAGNOSIS — J189 Pneumonia, unspecified organism: Secondary | ICD-10-CM

## 2022-04-01 DIAGNOSIS — Z720 Tobacco use: Secondary | ICD-10-CM

## 2022-04-01 DIAGNOSIS — F17210 Nicotine dependence, cigarettes, uncomplicated: Secondary | ICD-10-CM | POA: Diagnosis not present

## 2022-04-01 DIAGNOSIS — J9611 Chronic respiratory failure with hypoxia: Secondary | ICD-10-CM

## 2022-04-01 MED ORDER — NICOTINE 14 MG/24HR TD PT24: 14.0000 mg | MEDICATED_PATCH | Freq: Every day | TRANSDERMAL | 0 refills | Status: AC

## 2022-04-01 MED ORDER — NICOTINE 7 MG/24HR TD PT24: 7.0000 mg | MEDICATED_PATCH | Freq: Every day | TRANSDERMAL | 0 refills | Status: AC

## 2022-04-01 MED ORDER — ROFLUMILAST 500 MCG PO TABS: ORAL_TABLET | ORAL | 11 refills | Status: DC

## 2022-04-01 NOTE — Progress Notes (Signed)
Please notify patient that her chest x ray looks some worse compared to last time. I don't think we should do any more antibiotics at this point as this could be interstitial lung disease related to her smoking or an organizing pneumonia, which responds better to steroids. I do want to check a swallow study on her as she's on multiple medications that put her at increased risk for aspiration, which could cause this pneumonia to reoccur. I also want to obtain a high resolution CT chest to get a better look at her lungs. Please have her hold off on the daliresp, as I'm not sure this is related to her COPD right now. Thanks.

## 2022-04-01 NOTE — Assessment & Plan Note (Signed)
Possible AECOPD vs ILD flare vs recurrent pneumonia. See above. She has bronchospasm on exam and was improved on prednisone previously. We will start her on prednisone taper. Considered daliresp but with her imaging, not convinced that her symptoms are related to chronic bronchitis from COPD. Continue on scheduled triple therapy regimen and nebs.

## 2022-04-01 NOTE — Assessment & Plan Note (Addendum)
Abnormal CT/CXR. She was adequately covered with augmentin 10 day course after identifying RLL opacity with fevers and productive cough. She is somewhat improved today without recurrence of fevers but imaging is worse. Unclear if this is a recurrent pneumonia vs chronic infection vs ILD. 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 She does have significant dental caries without dental coverage which pose a risk for recurrent infection. She is also on numerous sedating medications which increase her risk for aspiration pneumonia. We will obtain swallow study. HRCT ordered for further evaluation. Advised her to obtain sputum cultures, as previously ordered. May need bronchoscopy - will discuss with Dr. Lamonte Sakai once results are back.  Patient Instructions  -Continue Flonase nasal spray 2 sprays each nostril daily until symptoms improve then 1 spray daily -Continue Mucinex over the counter Twice daily  -Continue Symbicort 2 puffs Twice daily. Brush tongue and rinse mouth afterwards. Use with spacer -Continue spiriva 2 puffs daily -Continue Albuterol inhaler 2 puffs or 3 mL neb every 6 hours as needed for shortness of breath or wheezing. Notify if symptoms persist despite rescue inhaler/neb use. Use neb up to twice a day until symptoms improve  -Continue omeprazole 1 tab daily  -Continue supplemental oxygen 2 lpm for goal >88-90%  -Prednisone taper. 4 tabs for 3 days, then 3 tabs for 3 days, 2 tabs for 3 days, then 1 tab for 3 days. Take in AM with food  -Nicotine patches - 14 mcg patch, apply 1 daily for 6 weeks then decrease to 7 mcg patch daily. Work on quitting smoking in the meantime.  Goal quit date 05/02/2022   Follow up in two weeks with Dr. Lamonte Sakai or Roxan Diesel, NP. If symptoms do not improve or worsen, please contact office for sooner follow up or seek emergency care.

## 2022-04-01 NOTE — Progress Notes (Unsigned)
_0  ID: Adriana Hall, female    DOB: 21-Mar-1971, 51 y.o.   MRN: [id number]  Chief Complaint  Patient presents with   Follow-up    Pt f/u she is still coughing up brown phlegm w/ brown phlegm with speckles. Reports she can't tell a difference w/ the symbicort inhaler.     Referring provider: No ref. provider found  HPI: 51 year old female, current smoker (60-pack-year history) followed for COPD, chronic respiratory failure with hypoxia on home oxygen, tobacco abuse.she is a patient of Dr. Agustina Caroli and was last seen in office 03/15/2022 by [facility] NP.  Past medical history significant for hypertension, CAD, diastolic CHF, AV regurg, GERD, hypertriglyceridemia, anxiety, alcohol use disorder, bipolar.   TEST/EVENTS:  11/2020 CT chest: Diffuse patchy groundglass airspace opacities with loculated small right pleural effusion.  Questionable pulmonary/pleural mass along the right middle lobe.  Associated right hilar and mediastinal lymphadenopathy.  Findings concerning for malignancy with superimposed infection.  Pulmonary hypertension.  Asymmetric pleural thickening of involving only the right hemothorax.  GGO in both lungs, somewhat sparing the bases, but extending to the pleural.  Some areas of traction bronchiectasis in upper lobes, but no obvious fibrosis. 11/16/2020 pleural fluid: No malignant cells 11/18/2020 echocardiogram: LV EF 60-65%, G1 DD, normal RV size and function.  Normal RA and LA.  IVC normal size and variability.  Trivial MR otherwise normal values. 01/14/2021 CXR 2 view: Bibasilar pulmonary infiltrates again identified.  Similar to previous exam.  Upper lungs clear.  No pleural effusion or pneumothorax 05/07/2021 CT chest without contrast: There is a right middle lobe mass, measuring 4.1 x 3.3 x 3.2 cm which is substantially reduced in size compared to 11/12/2020.  Malignancy is not excluded.  PET scan recommended for further evaluation.  There is also some mild paratracheal and  subcarinal adenopathy.  Paraseptal emphysema is present with coarse peripheral interstitial attenuation, localized left lower lobe bronchiectasis and some hazy peripheral groundglass opacities in the lungs; substantially improved compared to June 2022.  Several small nodules and some tree-in-bud nodularity is present as well. 06/24/2021 PFT: FVC 64, FEV1 72, ratio 88, TLC 79, DLCOcor 67 09/01/2021 PET scan: Mass in the right middle lobe has nearly completely resolved.  No hypermetabolic activity.  The mildly enlarged subcarinal and right lower paratracheal lymph nodes are mildly hypermetabolic.   09/01/2021 super D CT: As mentioned in the PET scan read there is marked improvement in the masslike density in the right middle lobe with only a residual bandlike scar.  There is a stable 6 mm left lower lobe nodule which is not hypermetabolic and has been stable since 2012.  There is coarse bilateral subpleural interstitial extenuation, probably from fibrosis.  There is some airway thickening, suggesting bronchitis or reactive airways disease. 03/15/2022 CXR: opacity in the RLL. This has waxing and waning nature; previously not hypermetabolic but will need follow up  09/08/2021: OV with Dr. Lamonte Sakai.  Followed for COPD following admission for severe right-sided pneumonia, associated parapneumonic effusion (cytology negative).  She had scattered groundglass infiltrates, question RB-ILD.  Most recent CT showed that the right upper lobe opacity was decreasing in size, groundglass infiltrates improving.  PET scan with almost complete resolution of the 4.1 cm right subpleural masslike lesion with only residual bandlike scarring and no hypermetabolism.  There was a 6 mm left lower lobe pulmonary nodule which was also not hypermetabolic and stable compared to 2012.  Interstitial and groundglass densities bilaterally suspected to be inflammatory.  There have been barriers  to getting on stable bronchodilator therapy.  Most recently  ordered Symbicort and Spiriva.  She benefited from these.  She is lost her insurance.  11/09/2021: OV with Dr. Lamonte Sakai.  Most recent chest imaging was reassuring, consistent with resolving pneumonia.  She was seen in mid May for increased cough, chest tightness, sputum production and treated with Levaquin and prednisone for AECOPD.  Feels like she is feeling better.  Does have some residual hoarseness and white plaquing on her throat.  Treated for thrush with fluconazole and nystatin.  Discussed that if she continues to have trouble with this, further referral to ENT is warranted.  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  Likely repeat CT chest at the 1 year mark which will be March 2024.  Started on Wellbutrin for smoking cessation.  Continued on Symbicort and Spiriva for COPD.  03/15/2022: OV with Aleysha Meckler NP for acute visit.  She has been having a increased cough for the last 3 to 4 weeks.  She was seen at urgent care on 03/04/2022.  Note reviewed today.  She was treated for conjunctivitis with ciprofloxacin no mention of her cough or respiratory problems.  Today, she tells me that her shortness of breath has been worse with rest and with exertion.  She is coughing up yellow to green phlegm.  Cough is worse at night and interrupts her sleep.  She's had a lot of nasal congestion/drainage, which is also yellow but sometimes brown. Makes her head hurt at times. She did have a fever around 2 weeks ago 100.2 that lasted for 4 days which prompted her evaluation at urgent care.  She has been taking ibuprofen and Tylenol since and has had not had any recurrence of fevers.  She took a COVID test 2 weeks ago which was negative.  She reports that she is just feeling terrible.  Feels very fatigued and rundown.  She also been struggling with thrush on and off from inhalers.  AECOPD, likely related to URI. She does have an opacity in the right lung, which  could be residual scarring from previous severe pneumonia or recurrent pna. Non-toxic appearing and VS stable. She was treated with albuterol neb and 80 mg depo inj x 1 in office today. We will treat her with empiric augmentin 10 day course and prednisone taper. I am also going to check sputum cultures on her. Continue triple therapy regimen. Add spacer to symbicort to see if we can help reduce recurrence of thrush. Target mucociliary clearance. Close follow up and strict ED precautions.   04/01/2022: Today - follow up Patient presents today with her husband for follow up. She was treated 10/10 for possible CAP and AECOPD with 10 day augmentin course and prednisone taper. She was also treated for recurrent thrush with diflucan and nystatin. Recommended she add spacer to her Symbicort. She was supposed to follow up in one week but had to reschedule. Today, she tells me she was feeling better but then feels like she's having a bad day today. She has an occasional productive cough with brown sputum. No recurrent fevers or chills. Still having some increased dyspnea and wheezing. She did feel better on prednisone but it makes her irritable. She denies any leg swelling, orthopnea, hemoptysis. Fatigue has improved some. Her appetite is also back to normal. She is using spiriva and symbicort. Feels like she benefits more from the Spiriva than the Symbicort. Never received  spacer from the pharmacy. She's using her nebs 4x/day. Ritta Slot is resolved. She continues on supplemental oxygen 2 lpm without any increased requirements. Still smoking; less than 1/2 ppd. Unable to tolerate wellbutrin. Interested in other measures to help.   Allergies  Allergen Reactions   Biotin     'Makes me extremely sick'   Doxycycline Nausea And Vomiting    Immunization History  Administered Date(s) Administered   Influenza-Unspecified 05/15/2021   Pneumococcal Polysaccharide-23 10/22/2013, 12/28/2019    Past Medical History:   Diagnosis Date   Anxiety    10 years   Arrhythmia    5 years   Bipolar 1 disorder, depressed ([place])    Bronchitis    Colitis 03-01-2011   Colonoscopy   Depression    10 years   Headache(784.0)    Chronic for 30 yrs   Hemorrhoids 03-01-2011   Colonoscopy    Hypertension     Tobacco History: Social History   Tobacco Use  Smoking Status Former   Packs/day: 3.00   Years: 30.00   Total pack years: 90.00   Types: Cigarettes  Smokeless Tobacco Never  Tobacco Comments   Trying to quit.  <Half a ppd.  03/15/2022 hfb   Counseling given: Not Answered Tobacco comments: Trying to quit.  <Half a ppd.  03/15/2022 hfb    Outpatient Medications Prior to Visit  Medication Sig Dispense Refill   acetaminophen (TYLENOL) 325 MG tablet Take 2 tablets (650 mg total) by mouth every 6 (six) hours as needed for mild pain (or Fever >/= 101). 30 tablet 0   ALPRAZolam (XANAX) 0.5 MG tablet Take 0.5 mg by mouth as needed.     budesonide-formoterol (SYMBICORT) 160-4.5 MCG/ACT inhaler Inhale 2 puffs into the lungs in the morning and at bedtime. 1 each 6   buprenorphine-naloxone (SUBOXONE) 8-2 mg SUBL SL tablet Place 1 tablet under the tongue in the morning and at bedtime.     diphenhydrAMINE (BENADRYL) 25 MG tablet Take 50 mg by mouth at bedtime.     ferrous sulfate 325 (65 FE) MG tablet Take 1 tablet (325 mg total) by mouth daily with breakfast. 30 tablet 0   fluticasone (FLONASE) 50 MCG/ACT nasal spray Place 1 spray into both nostrils daily. 18.2 mL 2   fluvoxaMINE (LUVOX) 100 MG tablet TAKE 1 TABLET BY MOUTH AT BEDTIME FOR 6 DAYS,THEN CONTINUE TAKING 2 TABLETS (100MG) AT BEDTIME 30 tablet 1   folic acid (FOLVITE) 1 MG tablet Take 1 tablet (1 mg total) by mouth daily. 30 tablet 0   gabapentin (NEURONTIN) 400 MG capsule Take 1 capsule (400 mg total) by mouth 3 (three) times daily. 90 capsule 2   hydrOXYzine (ATARAX) 25 MG tablet Take 1 tablet (25 mg total) by mouth 3 (three) times daily as needed for  anxiety. 90 tablet 2   ibuprofen (ADVIL) 200 MG tablet Take 400 mg by mouth every 6 (six) hours as needed for fever or moderate pain.     ipratropium-albuterol (DUONEB) 0.5-2.5 (3) MG/3ML SOLN Take 3 mLs by nebulization every 6 (six) hours as needed. 360 mL 12   nitroGLYCERIN (NITROLINGUAL) 0.4 MG/SPRAY spray Place 1 spray under the tongue once as needed for chest pain. 12 g 0   Nutritional Supplements (ESTROVEN PO) Take 1 tablet by mouth daily.     OLANZapine (ZYPREXA) 10 MG tablet Take 1 tablet (10 mg total) by mouth at bedtime. 30 tablet 2   omeprazole (PRILOSEC) 40 MG capsule Take 40 mg by mouth daily.  ondansetron (ZOFRAN) 4 MG tablet TAKE 1 TABLET BY MOUTH EVERY 8 HOURS AS NEEDED FOR NAUSEA AND VOMITING 30 tablet 0   predniSONE (DELTASONE) 10 MG tablet 4 tabs for 3 days, then 3 tabs for 3 days, 2 tabs for 3 days, then 1 tab for 3 days, then stop 30 tablet 0   Spacer/Aero-Holding Chambers (AEROCHAMBER MV) inhaler Use as instructed 1 each 2   Tiotropium Bromide Monohydrate (SPIRIVA RESPIMAT) 2.5 MCG/ACT AERS Inhale 2 puffs into the lungs daily. 4 g 3   traZODone (DESYREL) 100 MG tablet TAKE 2 TABLETS (200 MG TOTAL) BY MOUTH AT BEDTIME AS NEEDED FOR SLEEP. 60 tablet 2   VENTOLIN HFA 108 (90 Base) MCG/ACT inhaler TAKE 2 PUFFS BY MOUTH EVERY 6 HOURS AS NEEDED FOR WHEEZE OR SHORTNESS OF BREATH 18 each 2   vitamin B-12 100 MCG tablet Take 1 tablet (100 mcg total) by mouth daily. 30 tablet 0   nicotine (NICODERM CQ - DOSED IN MG/24 HOURS) 21 mg/24hr patch Place 1 patch (21 mg total) onto the skin daily. 28 patch 0   Facility-Administered Medications Prior to Visit  Medication Dose Route Frequency Provider Last Rate Last Admin   ipratropium-albuterol (DUONEB) 0.5-2.5 (3) MG/3ML nebulizer solution 3 mL  3 mL Nebulization Once Sachin Ferencz, Karie Schwalbe, NP       methylPREDNISolone acetate (DEPO-MEDROL) injection 80 mg  80 mg Intramuscular Once Anamaria Dusenbury, Karie Schwalbe, NP         Review of Systems:    Constitutional: No weight loss or gain, night sweats, fevers, fatigue, chills HEENT: +dental decay. No headaches, difficulty swallowing, or sore throat. No sneezing, itching, ear ache, nasal congestion, postnasal drip CV:  No chest pain, orthopnea, PND, swelling in lower extremities, anasarca, dizziness, palpitations, syncope Resp: +shortness of breath with exertion; productive cough; wheezing. No chest wall deformity. No hemoptysis. Skin: No rash, lesions, ulcerations MSK:  No joint pain or swelling.  No decreased range of motion.  No back pain. Neuro: No dizziness or lightheadedness.  Psych: No depression or anxiety. Mood stable.     Physical Exam:  BP 138/82   Pulse 81   Ht _0  (1.626 m)   Wt 194 lb 6.4 oz (88.2 kg)   SpO2 97%   BMI 33.37 kg/m   GEN: Pleasant, interactive, acute-on-chronically ill appearing; non-toxic; in no acute distress. HEENT:  Normocephalic and atraumatic. PERRLA. Sclera white. Nasal turbinates pink, moist and patent bilaterally. No rhinorrhea present. Oropharynx pink and moist, without exudate or edema. Edentulous with dental decay present. No lesions, ulcerations NECK:  Supple w/ fair ROM. No JVD present. Normal carotid impulses w/o bruits. Thyroid symmetrical with no goiter or nodules palpated. No lymphadenopathy.   CV: RRR, no m/r/g, no peripheral edema. Pulses intact, +2 bilaterally. No cyanosis, pallor or clubbing. PULMONARY:  Unlabored, regular breathing. Expiratory wheezes throughout. On 2L O2. No accessory muscle use. No dullness to percussion. GI: BS present and normoactive. Soft, non-tender to palpation. No organomegaly or masses detected.  MSK: No erythema, warmth or tenderness. No deformities or joint swelling noted.  Neuro: A/Ox3. No focal deficits noted.   Skin: Warm, no lesions or rashe Psych: Normal affect and behavior. Judgement and thought content appropriate.     Lab Results:  CBC    Component Value Date/Time   WBC 13.8 (H)  11/21/2020 0111   RBC 5.16 (H) 11/21/2020 0111   HGB 11.0 (L) 11/21/2020 0111   HCT 38.3 11/21/2020 0111   PLT 490 (H) 11/21/2020 0111  MCV 74.2 (L) 11/21/2020 0111   MCH 21.3 (L) 11/21/2020 0111   MCHC 28.7 (L) 11/21/2020 0111   RDW 24.9 (H) 11/21/2020 0111   LYMPHSABS 2.7 11/21/2020 0111   MONOABS 1.0 11/21/2020 0111   EOSABS 0.3 11/21/2020 0111   BASOSABS 0.1 11/21/2020 0111    BMET    Component Value Date/Time   NA 136 11/21/2020 0111   K 3.9 11/21/2020 0111   CL 96 (L) 11/21/2020 0111   CO2 32 11/21/2020 0111   GLUCOSE 99 11/21/2020 0111   BUN 9 11/21/2020 0111   CREATININE 0.59 11/21/2020 0111   CREATININE 0.75 08/31/2015 1259   CALCIUM 9.3 11/21/2020 0111   GFRNONAA >60 11/21/2020 0111   GFRNONAA >89 08/31/2015 1259   GFRAA >60 12/26/2019 1645   GFRAA >89 08/31/2015 1259    BNP    Component Value Date/Time   BNP 60.3 11/20/2020 0120     Imaging:  DG Chest 2 View  Result Date: 04/01/2022 CLINICAL DATA:  Productive cough, history of pneumonia EXAM: CHEST - 2 VIEW COMPARISON:  03/15/2022 FINDINGS: Frontal and lateral views of the chest demonstrates stable enlargement the cardiac silhouette. There are bilateral interstitial and ground-glass opacities, greatest at the lung bases, more pronounced than on the preceding exam. No effusion or pneumothorax. No acute bony abnormalities. IMPRESSION: 1. Continued waxing and waning appearance of the bibasilar interstitial and ground-glass opacities, with interval worsening since the 03/15/2022 exam. Findings could reflect sequela of chronic recurrent aspiration, atypical infection such as MAC, or sequela of underlying chronic interstitial lung disease such as UIP based on prior chest CT 09/01/2021. Electronically Signed   By: Randa Ngo M.D.   On: 04/01/2022 16:25   DG Chest 2 View  Result Date: 03/15/2022 CLINICAL DATA:  Productive cough EXAM: CHEST - 2 VIEW COMPARISON:  Multiple chest x-rays, most recently May 07, 2021, PET-CT September 01, 2021 FINDINGS: The cardiomediastinal silhouette is unchanged in contour. Heterogeneous opacity in the right lower lung. In comparison with prior chest x-rays and PET-CT, the right lower lobe opacity is waxing and waning in nature and did not demonstrate hypermetabolic activity. No pleural effusion or pneumothorax. Visualized upper abdomen is unremarkable. No acute osseous abnormality. IMPRESSION: Heterogeneous opacity in the right lower lung. Given the waxing and waning nature, this is most suggestive of aspiration/pneumonia. However, underlying malignancy is not excluded. Recommend repeat chest x-ray versus chest CT without contrast in 4-6 weeks to ensure resolution. Electronically Signed   By: Beryle Flock M.D.   On: 03/15/2022 09:42    albuterol (PROVENTIL) (2.5 MG/3ML) 0.083% nebulizer solution 2.5 mg     Date Action Dose Route User   03/15/2022 1100 Given 2.5 mg Nebulization (Right Ventrogluteal) Nolon Stalls, Kimber Relic, RN           Latest Ref Rng & Units 06/24/2021    3:50 PM  PFT Results  FVC-Pre L 2.21   FVC-Predicted Pre % 64   FVC-Post L 2.28   FVC-Predicted Post % 66   Pre FEV1/FVC % % 89   Post FEV1/FCV % % 88   FEV1-Pre L 1.96   FEV1-Predicted Pre % 72   FEV1-Post L 2.00   DLCO uncorrected ml/min/mmHg 13.79   DLCO UNC% % 67   DLCO corrected ml/min/mmHg 13.79   DLCO COR %Predicted % 67   DLVA Predicted % 80   TLC L 3.95   TLC % Predicted % 79   RV % Predicted % 97  No results found for: "NITRICOXIDE"      Assessment & Plan:   Abnormal CT of the chest Abnormal CT/CXR. She was adequately covered with augmentin 10 day course after identifying RLL opacity with fevers and productive cough. She is somewhat improved today without recurrence of fevers but imaging is worse. Unclear if this is a recurrent pneumonia vs chronic infection vs ILD. 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 She does have significant dental caries  without dental coverage which pose a risk for recurrent infection. She is also on numerous sedating medications which increase her risk for aspiration pneumonia. We will obtain swallow study. HRCT ordered for further evaluation. Advised her to obtain sputum cultures, as previously ordered. May need bronchoscopy - will discuss with Dr. Lamonte Sakai once results are back.  Patient Instructions  -Continue Flonase nasal spray 2 sprays each nostril daily until symptoms improve then 1 spray daily -Continue Mucinex over the counter Twice daily  -Continue Symbicort 2 puffs Twice daily. Brush tongue and rinse mouth afterwards. Use with spacer -Continue spiriva 2 puffs daily -Continue Albuterol inhaler 2 puffs or 3 mL neb every 6 hours as needed for shortness of breath or wheezing. Notify if symptoms persist despite rescue inhaler/neb use. Use neb up to twice a day until symptoms improve  -Continue omeprazole 1 tab daily  -Continue supplemental oxygen 2 lpm for goal >88-90%  -Prednisone taper. 4 tabs for 3 days, then 3 tabs for 3 days, 2 tabs for 3 days, then 1 tab for 3 days. Take in AM with food  -Nicotine patches - 14 mcg patch, apply 1 daily for 6 weeks then decrease to 7 mcg patch daily. Work on quitting smoking in the meantime.  Goal quit date 05/02/2022   Follow up in two weeks with Dr. Lamonte Sakai or Roxan Diesel, NP. If symptoms do not improve or worsen, please contact office for sooner follow up or seek emergency care.    COPD with acute exacerbation ([place]) Possible AECOPD vs ILD flare vs recurrent pneumonia. See above. She has bronchospasm on exam and was improved on prednisone previously. We will start her on prednisone taper. Considered daliresp but with her imaging, not convinced that her symptoms are related to chronic bronchitis from COPD. Continue on scheduled triple therapy regimen and nebs.   Chronic respiratory failure with hypoxia ([facility]) Stable without increased O2 requirement. Continue 2 lpm for  goal >88-90%  Tobacco abuse Negative side effects with mood changes on Wellbutrin. Agreeable to nicotine patches - order placed today.  The patient's current tobacco use: <1/2 ppd The patient was advised to quit and impact of smoking on their health.  I assessed the patient's willingness to attempt to quit. I provided methods and skills for cessation. We reviewed medication management of smoking session drugs if appropriate. Resources to help quit smoking were provided. A smoking cessation quit date was set: 05/02/2022 Follow-up was arranged in our clinic.  The amount of time spent counseling patient was 4 mins     I spent 45 minutes of dedicated to the care of this patient on the date of this encounter to include pre-visit review of records, face-to-face time with the patient discussing conditions above, post visit ordering of testing, clinical documentation with the electronic health record, making appropriate referrals as documented, and communicating necessary findings to members of the patients care team.  Clayton Bibles, NP 04/02/2022  Pt aware and understands NP's role.

## 2022-04-01 NOTE — Assessment & Plan Note (Signed)
Stable without increased O2 requirement. Continue 2 lpm for goal >88-90%

## 2022-04-01 NOTE — Patient Instructions (Addendum)
-  Continue Flonase nasal spray 2 sprays each nostril daily until symptoms improve then 1 spray daily -Continue Mucinex over the counter Twice daily  -Continue Symbicort 2 puffs Twice daily. Brush tongue and rinse mouth afterwards. Use with spacer -Continue spiriva 2 puffs daily -Continue Albuterol inhaler 2 puffs or 3 mL neb every 6 hours as needed for shortness of breath or wheezing. Notify if symptoms persist despite rescue inhaler/neb use. Use neb up to twice a day until symptoms improve  -Continue omeprazole 1 tab daily  -Continue supplemental oxygen 2 lpm for goal >88-90%  -Prednisone taper. 4 tabs for 3 days, then 3 tabs for 3 days, 2 tabs for 3 days, then 1 tab for 3 days. Take in AM with food  -Nicotine patches - 14 mcg patch, apply 1 daily for 6 weeks then decrease to 7 mcg patch daily. Work on quitting smoking in the meantime.  Goal quit date 05/02/2022   Follow up in two weeks with Dr. Lamonte Sakai or Roxan Diesel, NP. If symptoms do not improve or worsen, please contact office for sooner follow up or seek emergency care.

## 2022-04-02 NOTE — Assessment & Plan Note (Signed)
Negative side effects with mood changes on Wellbutrin. Agreeable to nicotine patches - order placed today.  The patient's current tobacco use: <1/2 ppd The patient was advised to quit and impact of smoking on their health.  I assessed the patient's willingness to attempt to quit. I provided methods and skills for cessation. We reviewed medication management of smoking session drugs if appropriate. Resources to help quit smoking were provided. A smoking cessation quit date was set: 05/02/2022 Follow-up was arranged in our clinic.  The amount of time spent counseling patient was 4 mins

## 2022-04-04 ENCOUNTER — Telehealth: Payer: Self-pay | Admitting: Emergency Medicine

## 2022-04-05 MED ORDER — BUDESONIDE-FORMOTEROL FUMARATE 160-4.5 MCG/ACT IN AERO: 2.0000 | INHALATION_SPRAY | Freq: Two times a day (BID) | RESPIRATORY_TRACT | 11 refills | Status: DC

## 2022-04-05 MED ORDER — SPIRIVA RESPIMAT 2.5 MCG/ACT IN AERS: 2.0000 | INHALATION_SPRAY | Freq: Every day | RESPIRATORY_TRACT | 11 refills | Status: DC

## 2022-04-05 NOTE — Telephone Encounter (Signed)
Spoke with the pt and notified refilled her symbicort  Also sent refill on her Spiriva per her request  Nothing further needed

## 2022-04-06 ENCOUNTER — Telehealth: Payer: Self-pay | Admitting: Emergency Medicine

## 2022-04-06 NOTE — Telephone Encounter (Signed)
Spoke with [facility] who state Symbicort needs a PA. Pharmacy will you please assist with that?   Updated pt and will notify when PA has been approved.

## 2022-04-07 ENCOUNTER — Other Ambulatory Visit (HOSPITAL_COMMUNITY): Payer: Self-pay

## 2022-04-07 NOTE — Telephone Encounter (Signed)
Per test claims Brand Symbicort is covered.

## 2022-04-08 NOTE — Telephone Encounter (Signed)
Pt's husband states they have now waited 6 days for medicaid to approve Symbicort and would like to know if she can have a sample inhaler since she is having breathing issues- informed pt's husband that we do not get  Symbicort samples and they want to know if there is another inhaler she can use. Please call husband back at [phone]

## 2022-04-11 ENCOUNTER — Ambulatory Visit (HOSPITAL_COMMUNITY)
Admission: RE | Admit: 2022-04-11 | Discharge: 2022-04-11 | Disposition: A | Discharge status: Home / Self Care | Payer: Medicaid Other | Source: Ambulatory Visit | Attending: Nurse Practitioner | Admitting: Nurse Practitioner

## 2022-04-11 DIAGNOSIS — J189 Pneumonia, unspecified organism: Secondary | ICD-10-CM | POA: Diagnosis present

## 2022-04-11 DIAGNOSIS — J441 Chronic obstructive pulmonary disease with (acute) exacerbation: Secondary | ICD-10-CM | POA: Diagnosis present

## 2022-04-11 NOTE — Telephone Encounter (Signed)
ATC LVMTCB   Spoke with [facility] who states Symbicort was picked up yesterday. Nothing further needed at this time.

## 2022-04-13 ENCOUNTER — Ambulatory Visit (INDEPENDENT_AMBULATORY_CARE_PROVIDER_SITE_OTHER): Payer: Medicaid Other | Admitting: Nurse Practitioner

## 2022-04-13 ENCOUNTER — Encounter: Payer: Self-pay | Admitting: Nurse Practitioner

## 2022-04-13 VITALS — BP 142/82 | HR 80 | Ht 64.0 in | Wt 191.0 lb

## 2022-04-13 DIAGNOSIS — J84115 Respiratory bronchiolitis interstitial lung disease: Secondary | ICD-10-CM | POA: Insufficient documentation

## 2022-04-13 DIAGNOSIS — J8489 Other specified interstitial pulmonary diseases: Secondary | ICD-10-CM

## 2022-04-13 DIAGNOSIS — Z720 Tobacco use: Secondary | ICD-10-CM

## 2022-04-13 DIAGNOSIS — J9611 Chronic respiratory failure with hypoxia: Secondary | ICD-10-CM | POA: Diagnosis not present

## 2022-04-13 DIAGNOSIS — J441 Chronic obstructive pulmonary disease with (acute) exacerbation: Secondary | ICD-10-CM

## 2022-04-13 DIAGNOSIS — J849 Interstitial pulmonary disease, unspecified: Secondary | ICD-10-CM | POA: Diagnosis not present

## 2022-04-13 HISTORY — DX: Respiratory bronchiolitis interstitial lung disease: J84.115

## 2022-04-13 LAB — CBC WITH DIFFERENTIAL/PLATELET
Basophils Absolute: 0.1 10*3/uL (ref 0.0–0.1)
Basophils Relative: 1.3 % (ref 0.0–3.0)
Eosinophils Absolute: 0.3 10*3/uL (ref 0.0–0.7)
Eosinophils Relative: 4 % (ref 0.0–5.0)
HCT: 48.4 % — ABNORMAL HIGH (ref 36.0–46.0)
Hemoglobin: 16.1 g/dL — ABNORMAL HIGH (ref 12.0–15.0)
Lymphocytes Relative: 34.6 % (ref 12.0–46.0)
Lymphs Abs: 2.8 10*3/uL (ref 0.7–4.0)
MCHC: 33.2 g/dL (ref 30.0–36.0)
MCV: 93.1 fl (ref 78.0–100.0)
Monocytes Absolute: 0.9 10*3/uL (ref 0.1–1.0)
Monocytes Relative: 10.8 % (ref 3.0–12.0)
Neutro Abs: 4 10*3/uL (ref 1.4–7.7)
Neutrophils Relative %: 49.3 % (ref 43.0–77.0)
Platelets: 248 10*3/uL (ref 150.0–400.0)
RBC: 5.2 Mil/uL — ABNORMAL HIGH (ref 3.87–5.11)
RDW: 15.4 % (ref 11.5–15.5)
WBC: 8.2 10*3/uL (ref 4.0–10.5)

## 2022-04-13 LAB — BASIC METABOLIC PANEL
BUN: 5 mg/dL — ABNORMAL LOW (ref 6–23)
CO2: 30 mEq/L (ref 19–32)
Calcium: 9.2 mg/dL (ref 8.4–10.5)
Chloride: 102 mEq/L (ref 96–112)
Creatinine, Ser: 0.57 mg/dL (ref 0.40–1.20)
GFR: 103.68 mL/min (ref >60.00)
Glucose, Bld: 121 mg/dL — ABNORMAL HIGH (ref 70–99)
Potassium: 3.9 mEq/L (ref 3.5–5.1)
Sodium: 139 mEq/L (ref 135–145)

## 2022-04-13 MED ORDER — METHYLPREDNISOLONE ACETATE 80 MG/ML IJ SUSP
80.0000 mg | Freq: Once | INTRAMUSCULAR | Status: AC
  Administered 2022-04-13: 80 mg via INTRAMUSCULAR

## 2022-04-13 MED ORDER — PREDNISONE 10 MG PO TABS: 10.0000 mg | ORAL_TABLET | Freq: Every day | ORAL | 1 refills | Status: DC

## 2022-04-13 MED ORDER — PREDNISONE 10 MG PO TABS: ORAL_TABLET | ORAL | 0 refills | Status: DC

## 2022-04-13 NOTE — Assessment & Plan Note (Addendum)
She was seen 10/10 and treated with augmentin course for CAP with RLL opacity on CXR, fevers, and productive cough. She attended follow up 10/27 with improved but persistent symptoms so repeat CXR was obtained, which showed worsening opacities. Concern for recurrent pneumonia vs chronic infection vs ILD; also possible RB-ILD or COP? HRCT obtained - possible postinfectious/postinflammatory scarring vs NSIP. Not UIP. Stable when compared to 05/2021. She has had previous autoimmune workup in June 2022, which was unremarkable. Never had TB testing or hypersensitivity pneumonitis, to complete ILD 2 panel. We did order a swallow study, which has yet to be completed - she is on numerous sedating medications so need to rule out aspiration. Never collected previous sputum cultures.

## 2022-04-13 NOTE — Assessment & Plan Note (Signed)
Working on cessation with nicotine patches. Quit date is 11/27.

## 2022-04-13 NOTE — Progress Notes (Signed)
_0  ID: Adriana Hall, female    DOB: Feb 26, 1971, 51 y.o.   MRN: [id number]  Chief Complaint  Patient presents with   Follow-up    Pt f/u she is still feeling bad, same symptoms as LOV (SOB, coughing, wheezing, fevers). COVID test 3 weeks prior was negative. Also curious about the CT scan results from 11/6    Referring provider: No ref. provider found  HPI: 51 year old female, current smoker (60-pack-year history) followed for COPD, chronic respiratory failure with hypoxia on home oxygen, tobacco abuse.she is a patient of Dr. Agustina Caroli and was last seen in office 04/01/2022 by [facility] NP.  Past medical history significant for hypertension, CAD, diastolic CHF, AV regurg, GERD, HLD, anxiety, alcohol use disorder, bipolar.   TEST/EVENTS:  11/2020 CT chest: Diffuse patchy groundglass airspace opacities with loculated small right pleural effusion.  Questionable pulmonary/pleural mass along the right middle lobe.  Associated right hilar and mediastinal lymphadenopathy.  Findings concerning for malignancy with superimposed infection.  Pulmonary hypertension.  Asymmetric pleural thickening of involving only the right hemothorax.  GGO in both lungs, somewhat sparing the bases, but extending to the pleural.  Some areas of traction bronchiectasis in upper lobes, but no obvious fibrosis. 11/16/2020 pleural fluid: No malignant cells 11/18/2020 echocardiogram: LV EF 60-65%, G1 DD, normal RV size and function.  Normal RA and LA.  IVC normal size and variability.  Trivial MR otherwise normal values. 01/14/2021 CXR 2 view: Bibasilar pulmonary infiltrates again identified.  Similar to previous exam.  Upper lungs clear.  No pleural effusion or pneumothorax 05/07/2021 CT chest without contrast: There is a right middle lobe mass, measuring 4.1 x 3.3 x 3.2 cm which is substantially reduced in size compared to 11/12/2020.  Malignancy is not excluded.  PET scan recommended for further evaluation.  There is also some mild  paratracheal and subcarinal adenopathy.  Paraseptal emphysema is present with coarse peripheral interstitial attenuation, localized left lower lobe bronchiectasis and some hazy peripheral groundglass opacities in the lungs; substantially improved compared to June 2022.  Several small nodules and some tree-in-bud nodularity is present as well. 06/24/2021 PFT: FVC 64, FEV1 72, ratio 88, TLC 79, DLCOcor 67 09/01/2021 PET scan: Mass in the right middle lobe has nearly completely resolved.  No hypermetabolic activity.  The mildly enlarged subcarinal and right lower paratracheal lymph nodes are mildly hypermetabolic.   09/01/2021 super D CT: As mentioned in the PET scan read there is marked improvement in the masslike density in the right middle lobe with only a residual bandlike scar.  There is a stable 6 mm left lower lobe nodule which is not hypermetabolic and has been stable since 2012.  There is coarse bilateral subpleural interstitial extenuation, probably from fibrosis.  There is some airway thickening, suggesting bronchitis or reactive airways disease. 03/15/2022 CXR: opacity in the RLL. This has waxing and waning nature; previously not hypermetabolic but will need follow up 04/11/2022 HRCT chest: atherosclerosis. Enlarged pulmonic trunk. Mediastinal lymph nodes, measure up to 34m. Paraseptal emphysema. Peribronchovascular and subpleural ground-glass in slightly nodular appearance and possibly minimally basilar predominant; similar to 05/2021 scan. Possible postinfectious/postinflammatory scarring vs mild fibrotic hypersensitivity pneumonitis; not UIP. Mild residual scarring in the RML. 5 mm RLL nodule, decreased from 7 mm 05/2021. 4 mm apical RUL nodule, stable. Probable 6 mm lymph node in LLL, unchanged.   09/08/2021: OV with Dr. BLamonte Sakai  Followed for COPD following admission for severe right-sided pneumonia, associated parapneumonic effusion (cytology negative).  She had scattered groundglass infiltrates,  question RB-ILD.  Most recent CT showed that the right upper lobe opacity was decreasing in size, groundglass infiltrates improving.  PET scan with almost complete resolution of the 4.1 cm right subpleural masslike lesion with only residual bandlike scarring and no hypermetabolism.  There was a 6 mm left lower lobe pulmonary nodule which was also not hypermetabolic and stable compared to 2012.  Interstitial and groundglass densities bilaterally suspected to be inflammatory.  There have been barriers to getting on stable bronchodilator therapy.  Most recently ordered Symbicort and Spiriva.  She benefited from these.  She is lost her insurance.  11/09/2021: OV with Dr. Lamonte Sakai.  Most recent chest imaging was reassuring, consistent with resolving pneumonia.  She was seen in mid May for increased cough, chest tightness, sputum production and treated with Levaquin and prednisone for AECOPD.  Feels like she is feeling better.  Does have some residual hoarseness and white plaquing on her throat.  Treated for thrush with fluconazole and nystatin.  Discussed that if she continues to have trouble with this, further referral to ENT is warranted.  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  Likely repeat CT chest at the 1 year mark which will be March 2024.  Started on Wellbutrin for smoking cessation.  Continued on Symbicort and Spiriva for COPD.  03/15/2022: OV with Wade Sigala NP for acute visit.  She has been having a increased cough for the last 3 to 4 weeks.  She was seen at urgent care on 03/04/2022.  Note reviewed today.  She was treated for conjunctivitis with ciprofloxacin no mention of her cough or respiratory problems.  Today, she tells me that her shortness of breath has been worse with rest and with exertion.  She is coughing up yellow to green phlegm.  Cough is worse at night and interrupts her sleep.  She's had a lot of nasal congestion/drainage,  which is also yellow but sometimes brown. Makes her head hurt at times. She did have a fever around 2 weeks ago 100.2 that lasted for 4 days which prompted her evaluation at urgent care.  She has been taking ibuprofen and Tylenol since and has had not had any recurrence of fevers.  She took a COVID test 2 weeks ago which was negative.  She reports that she is just feeling terrible.  Feels very fatigued and rundown.  She also been struggling with thrush on and off from inhalers.  AECOPD, likely related to URI. She does have an opacity in the right lung, which could be residual scarring from previous severe pneumonia or recurrent pna. Non-toxic appearing and VS stable. She was treated with albuterol neb and 80 mg depo inj x 1 in office today. We will treat her with empiric augmentin 10 day course and prednisone taper. I am also going to check sputum cultures on her. Continue triple therapy regimen. Add spacer to symbicort to see if we can help reduce recurrence of thrush. Target mucociliary clearance. Close follow up and strict ED precautions.   04/01/2022: OV with Thorvald Orsino NP for follow up. She was treated 10/10 for possible CAP and AECOPD with 10 day augmentin course and prednisone taper. She was also treated for recurrent thrush with diflucan and nystatin. Recommended she add spacer to her Symbicort. She was supposed to follow up in one week but had to reschedule. Today, she tells me she was feeling better but then feels like she's having a  bad day today. She has an occasional productive cough with brown sputum. No recurrent fevers or chills. Still having some increased dyspnea and wheezing. She did feel better on prednisone but it makes her irritable. Her appetite is also back to normal. She is using spiriva and symbicort. Feels like she benefits more from the Spiriva than the Symbicort. Never received spacer from the pharmacy. She's using her nebs 4x/day. Ritta Slot is resolved. She continues on supplemental oxygen 2  lpm without any increased requirements. Still smoking; less than 1/2 ppd. Unable to tolerate wellbutrin. Interested in other measures to help. Started on nicotine patches. Treated with prednisone taper for unresolved pna. Considered daliresp but based on CXR, unclear if COPD/chronic bronchitis is driving factor. CXR with some worsening; possibly interstitial lung disease related to her smoking or an organizing pna. Ordered swallow study as she is on multiple sedating medications which increase risk for aspiration and recurrent infections. HRCT ordered for further evaluation.  04/13/2022: Today - follow up Patient presents today for follow up and to discuss CT scan results with her husband. She feels unchanged compared to when she was here last; still has a productive cough with yellow to brown sputum and wheezing. She also still feels more short winded from her baseline. Never received the prednisone taper prescribed at last visit. Did feel better when she was on the augmentin and prednisone from 10/10. Denies fevers, chills, hemoptysis, leg swelling, orthopnea. She is eating and drinking well. Continues to smoke 1/2 ppd; using nicotine patches and working on cutting back 1 cigarette a day. Her goal is to be quit by 11/17. She is using Symbicort and Spiriva. Using nebs 4x/day.   Allergies  Allergen Reactions   Biotin     'Makes me extremely sick'   Doxycycline Nausea And Vomiting    Immunization History  Administered Date(s) Administered   Influenza-Unspecified 05/15/2021   Pneumococcal Polysaccharide-23 10/22/2013, 12/28/2019    Past Medical History:  Diagnosis Date   Anxiety    10 years   Arrhythmia    5 years   Bipolar 1 disorder, depressed ([place])    Bronchitis    Colitis 03-01-2011   Colonoscopy   Depression    10 years   Headache(784.0)    Chronic for 30 yrs   Hemorrhoids 03-01-2011   Colonoscopy    Hypertension     Tobacco History: Social History   Tobacco Use  Smoking  Status Every Day   Packs/day: 3.00   Years: 30.00   Total pack years: 90.00   Types: Cigarettes  Smokeless Tobacco Never  Tobacco Comments   Trying to quit. 0.25ppd; [facility] 04/13/2022   Ready to quit: Not Answered Counseling given: Not Answered Tobacco comments: Trying to quit. 0.25ppd; [facility]I 04/13/2022    Outpatient Medications Prior to Visit  Medication Sig Dispense Refill   acetaminophen (TYLENOL) 325 MG tablet Take 2 tablets (650 mg total) by mouth every 6 (six) hours as needed for mild pain (or Fever >/= 101). 30 tablet 0   ALPRAZolam (XANAX) 0.5 MG tablet Take 0.5 mg by mouth as needed.     budesonide-formoterol (SYMBICORT) 160-4.5 MCG/ACT inhaler Inhale 2 puffs into the lungs in the morning and at bedtime. 10.2 g 11   buprenorphine-naloxone (SUBOXONE) 8-2 mg SUBL SL tablet Place 1 tablet under the tongue in the morning and at bedtime.     diphenhydrAMINE (BENADRYL) 25 MG tablet Take 50 mg by mouth at bedtime.     ferrous sulfate 325 (65 FE) MG  tablet Take 1 tablet (325 mg total) by mouth daily with breakfast. 30 tablet 0   fluticasone (FLONASE) 50 MCG/ACT nasal spray Place 1 spray into both nostrils daily. 18.2 mL 2   fluvoxaMINE (LUVOX) 100 MG tablet TAKE 1 TABLET BY MOUTH AT BEDTIME FOR 6 DAYS,THEN CONTINUE TAKING 2 TABLETS (100MG) AT BEDTIME 30 tablet 1   folic acid (FOLVITE) 1 MG tablet Take 1 tablet (1 mg total) by mouth daily. 30 tablet 0   gabapentin (NEURONTIN) 400 MG capsule Take 1 capsule (400 mg total) by mouth 3 (three) times daily. 90 capsule 2   hydrOXYzine (ATARAX) 25 MG tablet Take 1 tablet (25 mg total) by mouth 3 (three) times daily as needed for anxiety. 90 tablet 2   ibuprofen (ADVIL) 200 MG tablet Take 400 mg by mouth every 6 (six) hours as needed for fever or moderate pain.     ipratropium-albuterol (DUONEB) 0.5-2.5 (3) MG/3ML SOLN Take 3 mLs by nebulization every 6 (six) hours as needed. 360 mL 12   nicotine (NICODERM CQ - DOSED IN MG/24 HOURS) 14 mg/24hr patch  Place 1 patch (14 mg total) onto the skin daily. 42 patch 0   [START ON 05/13/2022] nicotine (NICODERM CQ - DOSED IN MG/24 HR) 7 mg/24hr patch Place 1 patch (7 mg total) onto the skin daily for 14 days. 14 patch 0   nitroGLYCERIN (NITROLINGUAL) 0.4 MG/SPRAY spray Place 1 spray under the tongue once as needed for chest pain. 12 g 0   Nutritional Supplements (ESTROVEN PO) Take 1 tablet by mouth daily.     OLANZapine (ZYPREXA) 10 MG tablet Take 1 tablet (10 mg total) by mouth at bedtime. 30 tablet 2   omeprazole (PRILOSEC) 40 MG capsule Take 40 mg by mouth daily.     ondansetron (ZOFRAN) 4 MG tablet TAKE 1 TABLET BY MOUTH EVERY 8 HOURS AS NEEDED FOR NAUSEA AND VOMITING 30 tablet 0   Spacer/Aero-Holding Chambers (AEROCHAMBER MV) inhaler Use as instructed 1 each 2   Tiotropium Bromide Monohydrate (SPIRIVA RESPIMAT) 2.5 MCG/ACT AERS Inhale 2 puffs into the lungs daily. 4 g 11   traZODone (DESYREL) 100 MG tablet TAKE 2 TABLETS (200 MG TOTAL) BY MOUTH AT BEDTIME AS NEEDED FOR SLEEP. 60 tablet 2   VENTOLIN HFA 108 (90 Base) MCG/ACT inhaler TAKE 2 PUFFS BY MOUTH EVERY 6 HOURS AS NEEDED FOR WHEEZE OR SHORTNESS OF BREATH 18 each 2   vitamin B-12 100 MCG tablet Take 1 tablet (100 mcg total) by mouth daily. 30 tablet 0   predniSONE (DELTASONE) 10 MG tablet 4 tabs for 3 days, then 3 tabs for 3 days, 2 tabs for 3 days, then 1 tab for 3 days, then stop (Patient not taking: Reported on 04/13/2022) 30 tablet 0   Facility-Administered Medications Prior to Visit  Medication Dose Route Frequency Provider Last Rate Last Admin   ipratropium-albuterol (DUONEB) 0.5-2.5 (3) MG/3ML nebulizer solution 3 mL  3 mL Nebulization Once Ensley Blas, Karie Schwalbe, NP       methylPREDNISolone acetate (DEPO-MEDROL) injection 80 mg  80 mg Intramuscular Once Taaliyah Delpriore, Karie Schwalbe, NP         Review of Systems:   Constitutional: No weight loss or gain, night sweats, fevers, fatigue, chills HEENT: +dental decay. No headaches, difficulty swallowing,  or sore throat. No sneezing, itching, ear ache, nasal congestion, postnasal drip CV:  No chest pain, orthopnea, PND, swelling in lower extremities, anasarca, dizziness, palpitations, syncope Resp: +shortness of breath with exertion; productive cough; wheezing. No  chest wall deformity. No hemoptysis. GI:  No heartburn, indigestion, abdominal pain, nausea, vomiting, diarrhea, change in bowel habits, loss of appetite, bloody stools.  GU: No dysuria, change in color of urine, urgency or frequency.  No flank pain, no hematuria  Skin: No rash, lesions, ulcerations MSK:  No joint pain or swelling.  No decreased range of motion.  No back pain. Neuro: No dizziness or lightheadedness.  Psych: No depression or anxiety. Mood stable.     Physical Exam:  BP (!) 142/82   Pulse 80   Ht _0  (1.626 m)   Wt 191 lb (86.6 kg)   SpO2 98% Comment: 2L/m  BMI 32.79 kg/m   GEN: Pleasant, interactive, chronically ill appearing; non-toxic; in no acute distress. HEENT:  Normocephalic and atraumatic. PERRLA. Sclera white. Nasal turbinates pink, moist and patent bilaterally. No rhinorrhea present. Oropharynx pink and moist, without exudate or edema. Edentulous with dental decay present. No lesions, ulcerations NECK:  Supple w/ fair ROM. No JVD present. Normal carotid impulses w/o bruits. Thyroid symmetrical with no goiter or nodules palpated. No lymphadenopathy.   CV: RRR, no m/r/g, no peripheral edema. Pulses intact, +2 bilaterally. No cyanosis, pallor or clubbing. PULMONARY:  Unlabored, regular breathing. Expiratory wheezes throughout. On 2L O2. No accessory muscle use. No dullness to percussion. GI: BS present and normoactive. Soft, non-tender to palpation. No organomegaly or masses detected.  MSK: No erythema, warmth or tenderness. No deformities or joint swelling noted.  Neuro: A/Ox3. No focal deficits noted.   Skin: Warm, no lesions or rashe Psych: Normal affect and behavior. Judgement and thought content  appropriate.     Lab Results:  CBC    Component Value Date/Time   WBC 8.2 04/13/2022 1506   RBC 5.20 (H) 04/13/2022 1506   HGB 16.1 (H) 04/13/2022 1506   HCT 48.4 (H) 04/13/2022 1506   PLT 248.0 04/13/2022 1506   MCV 93.1 04/13/2022 1506   MCH 21.3 (L) 11/21/2020 0111   MCHC 33.2 04/13/2022 1506   RDW 15.4 04/13/2022 1506   LYMPHSABS 2.8 04/13/2022 1506   MONOABS 0.9 04/13/2022 1506   EOSABS 0.3 04/13/2022 1506   BASOSABS 0.1 04/13/2022 1506    BMET    Component Value Date/Time   NA 139 04/13/2022 1506   K 3.9 04/13/2022 1506   CL 102 04/13/2022 1506   CO2 30 04/13/2022 1506   GLUCOSE 121 (H) 04/13/2022 1506   BUN 5 (L) 04/13/2022 1506   CREATININE 0.57 04/13/2022 1506   CREATININE 0.75 08/31/2015 1259   CALCIUM 9.2 04/13/2022 1506   GFRNONAA >60 11/21/2020 0111   GFRNONAA >89 08/31/2015 1259   GFRAA >60 12/26/2019 1645   GFRAA >89 08/31/2015 1259    BNP    Component Value Date/Time   BNP 60.3 11/20/2020 0120     Imaging:  CT CHEST HIGH RESOLUTION  Result Date: 04/13/2022 CLINICAL DATA:  Interstitial lung disease, question organizing pneumonia. EXAM: CT CHEST WITHOUT CONTRAST TECHNIQUE: Multidetector CT imaging of the chest was performed following the standard protocol without intravenous contrast. High resolution imaging of the lungs, as well as inspiratory and expiratory imaging, was performed. RADIATION DOSE REDUCTION: This exam was performed according to the departmental dose-optimization program which includes automated exposure control, adjustment of the mA and/or kV according to patient size and/or use of iterative reconstruction technique. COMPARISON:  09/01/2021, PET 09/01/2021, CT chest 05/07/2021, 11/12/2020. FINDINGS: Cardiovascular: Atherosclerotic calcification of the aorta. Enlarged pulmonic trunk and heart. No pericardial effusion. Mediastinum/Nodes: Mediastinal lymph nodes  measure up to 9 mm in the low right paratracheal station. No pathologically  enlarged mediastinal lymph nodes. Hilar regions are difficult to definitively evaluate without IV contrast. No axillary adenopathy. Esophagus is grossly unremarkable. Distal periesophageal lymph node measures 7 mm, unchanged. Lungs/Pleura: Paraseptal emphysema. Peribronchovascular and subpleural ground-glass in with a slightly nodular appearance and possibly minimally basilar predominant. Findings are similar to 05/07/2021. Mild residual scarring in the inferior right middle lobe (6/100). 5 mm peripheral right lower lobe nodule (3 x 6 mm, 6/110), slightly decreased from 7 mm on 05/07/2021. 4 mm apical segment right upper lobe nodule (6/28), unchanged from 05/07/2021. Probable 6 mm subpleural lymph node in the lateral left lower lobe (6/96), unchanged from 05/07/2021. Per Fleischner Society guidelines, no follow-up is necessary. Expiratory phase imaging was not performed in true expiration, limiting the evaluation for air trapping. Upper Abdomen: Liver is mildly heterogeneous in attenuation. Visualized portions of the liver, adrenal glands, kidneys, spleen, pancreas, stomach and bowel are otherwise grossly unremarkable. No upper abdominal adenopathy. Musculoskeletal: No worrisome lytic or sclerotic lesions. IMPRESSION: 1. Pulmonary parenchymal pattern of interstitial lung disease may be postinfectious/postinflammatory in etiology, including due to COVID-19 pneumonia, in the appropriate clinical setting. Otherwise, mild fibrotic hypersensitivity pneumonitis is considered. Findings are suggestive of an alternative diagnosis (not UIP) per consensus guidelines: Diagnosis of Idiopathic Pulmonary Fibrosis: An Official ATS/ERS/JRS/ALAT Clinical Practice Guideline. [place], Iss 5, [phone], Feb 04 2017. 2. Liver appears mildly steatotic. 3.  Aortic atherosclerosis (ICD10-I70.0). 4. Enlarged pulmonic trunk, indicative of pulmonary arterial hypertension. 5.  Emphysema (ICD10-J43.9). Electronically  Signed   By: Lorin Picket M.D.   On: 04/13/2022 09:00   DG Chest 2 View  Result Date: 04/01/2022 CLINICAL DATA:  Productive cough, history of pneumonia EXAM: CHEST - 2 VIEW COMPARISON:  03/15/2022 FINDINGS: Frontal and lateral views of the chest demonstrates stable enlargement the cardiac silhouette. There are bilateral interstitial and ground-glass opacities, greatest at the lung bases, more pronounced than on the preceding exam. No effusion or pneumothorax. No acute bony abnormalities. IMPRESSION: 1. Continued waxing and waning appearance of the bibasilar interstitial and ground-glass opacities, with interval worsening since the 03/15/2022 exam. Findings could reflect sequela of chronic recurrent aspiration, atypical infection such as MAC, or sequela of underlying chronic interstitial lung disease such as UIP based on prior chest CT 09/01/2021. Electronically Signed   By: Randa Ngo M.D.   On: 04/01/2022 16:25   DG Chest 2 View  Result Date: 03/15/2022 CLINICAL DATA:  Productive cough EXAM: CHEST - 2 VIEW COMPARISON:  Multiple chest x-rays, most recently May 07, 2021, PET-CT September 01, 2021 FINDINGS: The cardiomediastinal silhouette is unchanged in contour. Heterogeneous opacity in the right lower lung. In comparison with prior chest x-rays and PET-CT, the right lower lobe opacity is waxing and waning in nature and did not demonstrate hypermetabolic activity. No pleural effusion or pneumothorax. Visualized upper abdomen is unremarkable. No acute osseous abnormality. IMPRESSION: Heterogeneous opacity in the right lower lung. Given the waxing and waning nature, this is most suggestive of aspiration/pneumonia. However, underlying malignancy is not excluded. Recommend repeat chest x-ray versus chest CT without contrast in 4-6 weeks to ensure resolution. Electronically Signed   By: Beryle Flock M.D.   On: 03/15/2022 09:42    albuterol (PROVENTIL) (2.5 MG/3ML) 0.083% nebulizer solution 2.5 mg      Date Action Dose Route User   03/15/2022 1100 Given 2.5 mg Nebulization (Right Ventrogluteal) Nolon Stalls, Kimber Relic, RN  methylPREDNISolone acetate (DEPO-MEDROL) injection 80 mg     Date Action Dose Route User   04/13/2022 1514 Given 80 mg Intramuscular (Left Ventrogluteal) Priscille Kluver, CMA           Latest Ref Rng & Units 06/24/2021    3:50 PM  PFT Results  FVC-Pre L 2.21   FVC-Predicted Pre % 64   FVC-Post L 2.28   FVC-Predicted Post % 66   Pre FEV1/FVC % % 89   Post FEV1/FCV % % 88   FEV1-Pre L 1.96   FEV1-Predicted Pre % 72   FEV1-Post L 2.00   DLCO uncorrected ml/min/mmHg 13.79   DLCO UNC% % 67   DLCO corrected ml/min/mmHg 13.79   DLCO COR %Predicted % 67   DLVA Predicted % 80   TLC L 3.95   TLC % Predicted % 79   RV % Predicted % 97     No results found for: "NITRICOXIDE"      Assessment & Plan:   ILD (interstitial lung disease) ([place]) She was seen 10/10 and treated with augmentin course for CAP with RLL opacity on CXR, fevers, and productive cough. She attended follow up 10/27 with improved but persistent symptoms so repeat CXR was obtained, which showed worsening opacities. Concern for recurrent pneumonia vs chronic infection vs ILD; also possible RB-ILD or COP? HRCT obtained - possible postinfectious/postinflammatory scarring vs NSIP. Not UIP. Stable when compared to 05/2021. She has had previous autoimmune workup in June 2022, which was unremarkable. Never had TB testing or hypersensitivity pneumonitis, to complete ILD 2 panel. We did order a swallow study, which has yet to be completed - she is on numerous sedating medications so need to rule out aspiration. Never collected previous sputum cultures.   COPD with acute exacerbation ([place]) Unresolved AECOPD. She initially improved with previous augmentin/steroid course. 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 Overall, stable when compared to 05/2021. No acute infectious process. Doesn't appear  to be consistent with recurrent pneumonia. We will treat her with additional prednisone taper her and start her on low dose prednisone to see if she has any perceived benefit. She has also not had allergy testing. Question if she has an allergic component to her flares? Check RAST, IgE and eosinophils today (last prednisone 3 weeks ago). Discussed that her continued tobacco use is negatively impacting her symptoms. She is working on quitting.   Patient Instructions  -Continue Flonase nasal spray 2 sprays each nostril daily until symptoms improve then 1 spray daily -Continue Mucinex over the counter Twice daily  -Continue Symbicort 2 puffs Twice daily. Brush tongue and rinse mouth afterwards. Use with spacer -Continue spiriva 2 puffs daily -Continue Albuterol inhaler 2 puffs or 3 mL neb every 6 hours as needed for shortness of breath or wheezing. Notify if symptoms persist despite rescue inhaler/neb use. Use neb up to twice a day until symptoms improve  -Continue omeprazole 1 tab daily  -Continue supplemental oxygen 2 lpm for goal >88-90%   -Prednisone taper. 4 tabs for 3 days, then 3 tabs for 3 days, 2 tabs for 3 days, then 1 tab daily. Take in AM with food. Start tomorrow.    -Nicotine patches - 14 mcg patch, apply 1 daily for 6 weeks then decrease to 7 mcg patch daily. Work on quitting smoking in the meantime.  Goal quit date 05/02/2022  Labs today    Follow up in one week with Katie Tiffaney Heimann,NP then in 6 weeks with Dr. Lamonte Sakai.  If symptoms do not improve or worsen, please contact office for sooner follow up or seek emergency care.   Chronic respiratory failure with hypoxia (HCC) Stable without increased oxygen requirement. Goal >88-90%  Tobacco abuse Working on cessation with nicotine patches. Quit date is 11/27.    I spent 38 minutes of dedicated to the care of this patient on the date of this encounter to include pre-visit review of records, face-to-face time with the patient discussing  conditions above, post visit ordering of testing, clinical documentation with the electronic health record, making appropriate referrals as documented, and communicating necessary findings to members of the patients care team.  Clayton Bibles, NP 04/13/2022  Pt aware and understands NP's role.

## 2022-04-13 NOTE — Assessment & Plan Note (Signed)
Stable without increased oxygen requirement. Goal >88-90%

## 2022-04-13 NOTE — Patient Instructions (Addendum)
-  Continue Flonase nasal spray 2 sprays each nostril daily until symptoms improve then 1 spray daily -Continue Mucinex over the counter Twice daily  -Continue Symbicort 2 puffs Twice daily. Brush tongue and rinse mouth afterwards. Use with spacer -Continue spiriva 2 puffs daily -Continue Albuterol inhaler 2 puffs or 3 mL neb every 6 hours as needed for shortness of breath or wheezing. Notify if symptoms persist despite rescue inhaler/neb use. Use neb up to twice a day until symptoms improve  -Continue omeprazole 1 tab daily  -Continue supplemental oxygen 2 lpm for goal >88-90%   -Prednisone taper. 4 tabs for 3 days, then 3 tabs for 3 days, 2 tabs for 3 days, then 1 tab daily. Take in AM with food. Start tomorrow.    -Nicotine patches - 14 mcg patch, apply 1 daily for 6 weeks then decrease to 7 mcg patch daily. Work on quitting smoking in the meantime.  Goal quit date 05/02/2022  Labs today    Follow up in one week with Katie Mitcheal Sweetin,NP then in 6 weeks with Dr. Lamonte Sakai. If symptoms do not improve or worsen, please contact office for sooner follow up or seek emergency care.

## 2022-04-13 NOTE — Assessment & Plan Note (Signed)
Unresolved AECOPD. She initially improved with previous augmentin/steroid course. 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 Overall, stable when compared to 05/2021. No acute infectious process. Doesn't appear to be consistent with recurrent pneumonia. We will treat her with additional prednisone taper her and start her on low dose prednisone to see if she has any perceived benefit. She has also not had allergy testing. Question if she has an allergic component to her flares? Check RAST, IgE and eosinophils today (last prednisone 3 weeks ago). Discussed that her continued tobacco use is negatively impacting her symptoms. She is working on quitting.   Patient Instructions  -Continue Flonase nasal spray 2 sprays each nostril daily until symptoms improve then 1 spray daily -Continue Mucinex over the counter Twice daily  -Continue Symbicort 2 puffs Twice daily. Brush tongue and rinse mouth afterwards. Use with spacer -Continue spiriva 2 puffs daily -Continue Albuterol inhaler 2 puffs or 3 mL neb every 6 hours as needed for shortness of breath or wheezing. Notify if symptoms persist despite rescue inhaler/neb use. Use neb up to twice a day until symptoms improve  -Continue omeprazole 1 tab daily  -Continue supplemental oxygen 2 lpm for goal >88-90%   -Prednisone taper. 4 tabs for 3 days, then 3 tabs for 3 days, 2 tabs for 3 days, then 1 tab daily. Take in AM with food. Start tomorrow.    -Nicotine patches - 14 mcg patch, apply 1 daily for 6 weeks then decrease to 7 mcg patch daily. Work on quitting smoking in the meantime.  Goal quit date 05/02/2022  Labs today    Follow up in one week with Katie Hanz Winterhalter,NP then in 6 weeks with Dr. Lamonte Sakai. If symptoms do not improve or worsen, please contact office for sooner follow up or seek emergency care.

## 2022-04-17 LAB — QUANTIFERON-TB GOLD PLUS
Mitogen-NIL: >10 IU/mL
NIL: 0.04 IU/mL
QuantiFERON-TB Gold Plus: NEGATIVE
TB1-NIL: 0.09 IU/mL
TB2-NIL: 0.1 IU/mL

## 2022-04-19 LAB — ALLERGEN PANEL (27) + IGE
Alternaria Alternata IgE: <0.1 kU/L
Aspergillus Fumigatus IgE: <0.1 kU/L
Bahia Grass IgE: <0.1 kU/L
Bermuda Grass IgE: <0.1 kU/L
Cat Dander IgE: <0.1 kU/L
Cedar, Mountain IgE: <0.1 kU/L
Cladosporium Herbarum IgE: <0.1 kU/L
Cocklebur IgE: <0.1 kU/L
Cockroach, American IgE: <0.1 kU/L
Common Silver Birch IgE: <0.1 kU/L
D Farinae IgE: <0.1 kU/L
D Pteronyssinus IgE: <0.1 kU/L
Dog Dander IgE: <0.1 kU/L
Elm, American IgE: <0.1 kU/L
Hickory, White IgE: <0.1 kU/L
IgE (Immunoglobulin E), Serum: 20 IU/mL (ref 6–495)
Johnson Grass IgE: <0.1 kU/L
Kentucky Bluegrass IgE: <0.1 kU/L
Maple/Box Elder IgE: <0.1 kU/L
Mucor Racemosus IgE: <0.1 kU/L
Oak, White IgE: <0.1 kU/L
Penicillium Chrysogen IgE: <0.1 kU/L
Pigweed, Rough IgE: <0.1 kU/L
Plantain, English IgE: <0.1 kU/L
Ragweed, Short IgE: <0.1 kU/L
Setomelanomma Rostrat: <0.1 kU/L
Timothy Grass IgE: <0.1 kU/L
White Mulberry IgE: <0.1 kU/L

## 2022-04-19 LAB — HYPERSENSITIVITY PNEUMONITIS
A. Pullulans Abs: NEGATIVE
A.Fumigatus #1 Abs: NEGATIVE
Micropolyspora faeni, IgG: NEGATIVE
Pigeon Serum Abs: NEGATIVE
Thermoact. Saccharii: NEGATIVE
Thermoactinomyces vulgaris, IgG: NEGATIVE

## 2022-04-20 ENCOUNTER — Encounter: Payer: Self-pay | Admitting: Nurse Practitioner

## 2022-04-20 ENCOUNTER — Ambulatory Visit (INDEPENDENT_AMBULATORY_CARE_PROVIDER_SITE_OTHER): Payer: Medicaid Other | Admitting: Nurse Practitioner

## 2022-04-20 VITALS — BP 126/74 | HR 81 | Ht 64.0 in | Wt 192.0 lb

## 2022-04-20 DIAGNOSIS — R5383 Other fatigue: Secondary | ICD-10-CM

## 2022-04-20 DIAGNOSIS — J449 Chronic obstructive pulmonary disease, unspecified: Secondary | ICD-10-CM | POA: Diagnosis not present

## 2022-04-20 DIAGNOSIS — Z720 Tobacco use: Secondary | ICD-10-CM

## 2022-04-20 DIAGNOSIS — J441 Chronic obstructive pulmonary disease with (acute) exacerbation: Secondary | ICD-10-CM

## 2022-04-20 DIAGNOSIS — J849 Interstitial pulmonary disease, unspecified: Secondary | ICD-10-CM | POA: Diagnosis not present

## 2022-04-20 DIAGNOSIS — J9611 Chronic respiratory failure with hypoxia: Secondary | ICD-10-CM

## 2022-04-20 LAB — TSH: TSH: 2.47 u[IU]/mL (ref 0.35–5.50)

## 2022-04-20 MED ORDER — PREDNISONE 20 MG PO TABS: 20.0000 mg | ORAL_TABLET | Freq: Every day | ORAL | 0 refills | Status: AC

## 2022-04-20 MED ORDER — PREDNISONE 10 MG PO TABS: 10.0000 mg | ORAL_TABLET | Freq: Every day | ORAL | 0 refills | Status: DC

## 2022-04-20 NOTE — Patient Instructions (Signed)
-  Continue Flonase nasal spray 2 sprays each nostril daily until symptoms improve then 1 spray daily -Continue Mucinex over the counter Twice daily  -Continue Symbicort 2 puffs Twice daily. Brush tongue and rinse mouth afterwards. Use with spacer -Continue spiriva 2 puffs daily -Continue Albuterol inhaler 2 puffs or 3 mL neb every 6 hours as needed for shortness of breath or wheezing. Notify if symptoms persist despite rescue inhaler/neb use. Use neb up to twice a day until symptoms improve  -Continue omeprazole 1 tab daily  -Continue supplemental oxygen 2 lpm for goal >88-90%  Prednisone 20 mg daily for 5 days then transition to 10 mg daily. Take in AM with food.   -Nicotine patches - 14 mcg patch, apply 1 daily for 6 weeks then decrease to 7 mcg patch daily. Work on quitting smoking in the meantime.  Goal quit date 05/02/2022  Check thyroid level today   Follow up as scheduled with Dr. Lamonte Sakai on 12/20. If symptoms do not improve or worsen, please contact office for sooner follow up or seek emergency care.

## 2022-04-20 NOTE — Progress Notes (Signed)
_0  ID: Adriana Hall, female    DOB: [birth year], 51 y.o.   MRN: [id number]  Chief Complaint  Patient presents with   Follow-up    Pt f/u she reports she isn't feeling any better, she reports the prednisone makes her feel "crazy" and she had to stop it. Inhalers are helping.     Referring provider: No ref. provider found  HPI: 51 year old female, current smoker (60-pack-year history) followed for COPD, chronic respiratory failure with hypoxia on home oxygen, tobacco abuse.she is a patient of Dr. Agustina Caroli and was last seen in office 04/13/2022 by [facility] NP.  Past medical history significant for hypertension, CAD, diastolic CHF, AV regurg, GERD, HLD, anxiety, alcohol use disorder, bipolar.   TEST/EVENTS:  11/2020 CT chest: Diffuse patchy groundglass airspace opacities with loculated small right pleural effusion.  Questionable pulmonary/pleural mass along the right middle lobe.  Associated right hilar and mediastinal lymphadenopathy.  Findings concerning for malignancy with superimposed infection.  Pulmonary hypertension.  Asymmetric pleural thickening of involving only the right hemothorax.  GGO in both lungs, somewhat sparing the bases, but extending to the pleural.  Some areas of traction bronchiectasis in upper lobes, but no obvious fibrosis. 11/16/2020 pleural fluid: No malignant cells 11/18/2020 echocardiogram: LV EF 60-65%, G1 DD, normal RV size and function.  Normal RA and LA.  IVC normal size and variability.  Trivial MR otherwise normal values. 01/14/2021 CXR 2 view: Bibasilar pulmonary infiltrates again identified.  Similar to previous exam.  Upper lungs clear.  No pleural effusion or pneumothorax 05/07/2021 CT chest without contrast: There is a right middle lobe mass, measuring 4.1 x 3.3 x 3.2 cm which is substantially reduced in size compared to 11/12/2020.  Malignancy is not excluded.  PET scan recommended for further evaluation.  There is also some mild paratracheal and subcarinal  adenopathy.  Paraseptal emphysema is present with coarse peripheral interstitial attenuation, localized left lower lobe bronchiectasis and some hazy peripheral groundglass opacities in the lungs; substantially improved compared to June 2022.  Several small nodules and some tree-in-bud nodularity is present as well. 06/24/2021 PFT: FVC 64, FEV1 72, ratio 88, TLC 79, DLCOcor 67 09/01/2021 PET scan: Mass in the right middle lobe has nearly completely resolved.  No hypermetabolic activity.  The mildly enlarged subcarinal and right lower paratracheal lymph nodes are mildly hypermetabolic.   09/01/2021 super D CT: As mentioned in the PET scan read there is marked improvement in the masslike density in the right middle lobe with only a residual bandlike scar.  There is a stable 6 mm left lower lobe nodule which is not hypermetabolic and has been stable since 2012.  There is coarse bilateral subpleural interstitial extenuation, probably from fibrosis.  There is some airway thickening, suggesting bronchitis or reactive airways disease. 03/15/2022 CXR: opacity in the RLL. This has waxing and waning nature; previously not hypermetabolic but will need follow up 04/11/2022 HRCT chest: atherosclerosis. Enlarged pulmonic trunk. Mediastinal lymph nodes, measure up to 17m. Paraseptal emphysema. Peribronchovascular and subpleural ground-glass in slightly nodular appearance and possibly minimally basilar predominant; similar to 05/2021 scan. Possible postinfectious/postinflammatory scarring vs mild fibrotic hypersensitivity pneumonitis; not UIP. Mild residual scarring in the RML. 5 mm RLL nodule, decreased from 7 mm 05/2021. 4 mm apical RUL nodule, stable. Probable 6 mm lymph node in LLL, unchanged.   09/08/2021: OV with Dr. BLamonte Sakai  Followed for COPD following admission for severe right-sided pneumonia, associated parapneumonic effusion (cytology negative).  She had scattered groundglass infiltrates, question RB-ILD.  Most  recent CT  showed that the right upper lobe opacity was decreasing in size, groundglass infiltrates improving.  PET scan with almost complete resolution of the 4.1 cm right subpleural masslike lesion with only residual bandlike scarring and no hypermetabolism.  There was a 6 mm left lower lobe pulmonary nodule which was also not hypermetabolic and stable compared to 2012.  Interstitial and groundglass densities bilaterally suspected to be inflammatory.  There have been barriers to getting on stable bronchodilator therapy.  Most recently ordered Symbicort and Spiriva.  She benefited from these.  She is lost her insurance.  11/09/2021: OV with Dr. Lamonte Sakai.  Most recent chest imaging was reassuring, consistent with resolving pneumonia.  She was seen in mid May for increased cough, chest tightness, sputum production and treated with Levaquin and prednisone for AECOPD.  Feels like she is feeling better.  Does have some residual hoarseness and white plaquing on her throat.  Treated for thrush with fluconazole and nystatin.  Discussed that if she continues to have trouble with this, further referral to ENT is warranted.  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  Likely repeat CT chest at the 1 year mark which will be March 2024.  Started on Wellbutrin for smoking cessation.  Continued on Symbicort and Spiriva for COPD.  03/15/2022: OV with Lequita Meadowcroft NP for acute visit.  She has been having a increased cough for the last 3 to 4 weeks.  She was seen at urgent care on 03/04/2022.  Note reviewed today.  She was treated for conjunctivitis with ciprofloxacin no mention of her cough or respiratory problems.  Today, she tells me that her shortness of breath has been worse with rest and with exertion.  She is coughing up yellow to green phlegm.  Cough is worse at night and interrupts her sleep.  She's had a lot of nasal congestion/drainage, which is also yellow but  sometimes brown. Makes her head hurt at times. She did have a fever around 2 weeks ago 100.2 that lasted for 4 days which prompted her evaluation at urgent care.  She has been taking ibuprofen and Tylenol since and has had not had any recurrence of fevers.  She took a COVID test 2 weeks ago which was negative.  She reports that she is just feeling terrible.  Feels very fatigued and rundown.  She also been struggling with thrush on and off from inhalers.  AECOPD, likely related to URI. She does have an opacity in the right lung, which could be residual scarring from previous severe pneumonia or recurrent pna. Non-toxic appearing and VS stable. She was treated with albuterol neb and 80 mg depo inj x 1 in office today. We will treat her with empiric augmentin 10 day course and prednisone taper. I am also going to check sputum cultures on her. Continue triple therapy regimen. Add spacer to symbicort to see if we can help reduce recurrence of thrush. Target mucociliary clearance. Close follow up and strict ED precautions.   04/01/2022: OV with Emmett Arntz NP for follow up. She was treated 10/10 for possible CAP and AECOPD with 10 day augmentin course and prednisone taper. She was also treated for recurrent thrush with diflucan and nystatin. Recommended she add spacer to her Symbicort. She was supposed to follow up in one week but had to reschedule. Today, she tells me she was feeling better but then feels like she's having a bad day today.  She has an occasional productive cough with brown sputum. No recurrent fevers or chills. Still having some increased dyspnea and wheezing. She did feel better on prednisone but it makes her irritable. Her appetite is also back to normal. She is using spiriva and symbicort. Feels like she benefits more from the Spiriva than the Symbicort. Never received spacer from the pharmacy. She's using her nebs 4x/day. Ritta Slot is resolved. She continues on supplemental oxygen 2 lpm without any increased  requirements. Still smoking; less than 1/2 ppd. Unable to tolerate wellbutrin. Interested in other measures to help. Started on nicotine patches. Treated with prednisone taper for unresolved pna. Considered daliresp but based on CXR, unclear if COPD/chronic bronchitis is driving factor. CXR with some worsening; possibly interstitial lung disease related to her smoking or an organizing pna. Ordered swallow study as she is on multiple sedating medications which increase risk for aspiration and recurrent infections. HRCT ordered for further evaluation.  04/13/2022:  OV with Jiana Lemaire NP for follow up and to discuss CT scan results with her husband. She feels unchanged compared to when she was here last; still has a productive cough with yellow to brown sputum and wheezing. She also still feels more short winded from her baseline. Never received the prednisone taper prescribed at last visit. Did feel better when she was on the augmentin and prednisone from 10/10. Denies fevers, chills, hemoptysis, leg swelling, orthopnea. She is eating and drinking well. Continues to smoke 1/2 ppd; using nicotine patches and working on cutting back 1 cigarette a day. Her goal is to be quit by 11/17. She is using Symbicort and Spiriva. Using nebs 4x/day. HRCT obtained after previous visit - possible postinfectious/postinflammatory scarring vs NSIP. Not UIP. Findings were stable from 05/2021. Didn't appear to be consistent with recurrent pneumonia. COP? Autoimmune workup from June was negative; never had TB or hypersensitivity panel so this was ordered to complete ILD panel 2. Unresolved AECOPD - never completed previously prescribed prednisone taper. Treated her with additional prednisone taper her and started her on low dose prednisone to see if she has any perceived benefit. She has also not had allergy testing. Question if she has an allergic component to her flares? Check RAST, IgE and eosinophils today (last prednisone 3 weeks ago).  Discussed that her continued tobacco use is negatively impacting her symptoms. She is working on quitting.   04/20/2022: Today - follow up Patient presents today for follow up with her husband. She looks the best I have seen her recently. She does feel better but is still struggling with feeling fatigued throughout the day. She's worried about her thyroid; has a family history of hypothyroidism. No significant weight changes, hot/cold intolerance, palpitations. She is also still having some wheezing and feels more short of breath than her baseline. Her cough is clearing up; less purulent and less frequent. No increased chest congestion, fevers, chills, night sweats, hemoptysis. She didn't complete previously prescribed prednisone as directed and never started on daily dose. She does feel better when she's on steroids but feels like the higher doses make her crazy. She did 2 days of 40 mg then 2 days of 20 mg then 1 day of 10 mg then stopped. She's using her Symbicort and Spiriva daily. Down to just a few cigarettes; wearing her nicotine patches which are helping.   Allergies  Allergen Reactions   Biotin     'Makes me extremely sick'   Doxycycline Nausea And Vomiting    Immunization History  Administered Date(s)  Administered   Influenza-Unspecified 05/15/2021   Pneumococcal Polysaccharide-23 10/22/2013, 12/28/2019    Past Medical History:  Diagnosis Date   Anxiety    10 years   Arrhythmia    5 years   Bipolar 1 disorder, depressed ([place])    Bronchitis    Colitis 03-01-2011   Colonoscopy   Depression    10 years   Headache(784.0)    Chronic for 30 yrs   Hemorrhoids 03-01-2011   Colonoscopy    Hypertension     Tobacco History: Social History   Tobacco Use  Smoking Status Every Day   Packs/day: 3.00   Years: 30.00   Total pack years: 90.00   Types: Cigarettes  Smokeless Tobacco Never  Tobacco Comments   Trying to quit. 4-5 cigarettes per day . 04/20/2022 [facility]   Ready to  quit: Not Answered Counseling given: Not Answered Tobacco comments: Trying to quit. 4-5 cigarettes per day . 04/20/2022 [facility]I    Outpatient Medications Prior to Visit  Medication Sig Dispense Refill   acetaminophen (TYLENOL) 325 MG tablet Take 2 tablets (650 mg total) by mouth every 6 (six) hours as needed for mild pain (or Fever >/= 101). 30 tablet 0   ALPRAZolam (XANAX) 0.5 MG tablet Take 0.5 mg by mouth as needed.     budesonide-formoterol (SYMBICORT) 160-4.5 MCG/ACT inhaler Inhale 2 puffs into the lungs in the morning and at bedtime. 10.2 g 11   buprenorphine-naloxone (SUBOXONE) 8-2 mg SUBL SL tablet Place 1 tablet under the tongue in the morning and at bedtime.     diphenhydrAMINE (BENADRYL) 25 MG tablet Take 50 mg by mouth at bedtime.     ferrous sulfate 325 (65 FE) MG tablet Take 1 tablet (325 mg total) by mouth daily with breakfast. 30 tablet 0   fluticasone (FLONASE) 50 MCG/ACT nasal spray Place 1 spray into both nostrils daily. 18.2 mL 2   fluvoxaMINE (LUVOX) 100 MG tablet TAKE 1 TABLET BY MOUTH AT BEDTIME FOR 6 DAYS,THEN CONTINUE TAKING 2 TABLETS (100MG) AT BEDTIME 30 tablet 1   folic acid (FOLVITE) 1 MG tablet Take 1 tablet (1 mg total) by mouth daily. 30 tablet 0   gabapentin (NEURONTIN) 400 MG capsule Take 1 capsule (400 mg total) by mouth 3 (three) times daily. 90 capsule 2   hydrOXYzine (ATARAX) 25 MG tablet Take 1 tablet (25 mg total) by mouth 3 (three) times daily as needed for anxiety. 90 tablet 2   ibuprofen (ADVIL) 200 MG tablet Take 400 mg by mouth every 6 (six) hours as needed for fever or moderate pain.     ipratropium-albuterol (DUONEB) 0.5-2.5 (3) MG/3ML SOLN Take 3 mLs by nebulization every 6 (six) hours as needed. 360 mL 12   nicotine (NICODERM CQ - DOSED IN MG/24 HOURS) 14 mg/24hr patch Place 1 patch (14 mg total) onto the skin daily. 42 patch 0   [START ON 05/13/2022] nicotine (NICODERM CQ - DOSED IN MG/24 HR) 7 mg/24hr patch Place 1 patch (7 mg total) onto the skin  daily for 14 days. 14 patch 0   nitroGLYCERIN (NITROLINGUAL) 0.4 MG/SPRAY spray Place 1 spray under the tongue once as needed for chest pain. 12 g 0   Nutritional Supplements (ESTROVEN PO) Take 1 tablet by mouth daily.     OLANZapine (ZYPREXA) 10 MG tablet Take 1 tablet (10 mg total) by mouth at bedtime. 30 tablet 2   omeprazole (PRILOSEC) 40 MG capsule Take 40 mg by mouth daily.     ondansetron (ZOFRAN)  4 MG tablet TAKE 1 TABLET BY MOUTH EVERY 8 HOURS AS NEEDED FOR NAUSEA AND VOMITING 30 tablet 0   Spacer/Aero-Holding Chambers (AEROCHAMBER MV) inhaler Use as instructed 1 each 2   Tiotropium Bromide Monohydrate (SPIRIVA RESPIMAT) 2.5 MCG/ACT AERS Inhale 2 puffs into the lungs daily. 4 g 11   traZODone (DESYREL) 100 MG tablet TAKE 2 TABLETS (200 MG TOTAL) BY MOUTH AT BEDTIME AS NEEDED FOR SLEEP. 60 tablet 2   VENTOLIN HFA 108 (90 Base) MCG/ACT inhaler TAKE 2 PUFFS BY MOUTH EVERY 6 HOURS AS NEEDED FOR WHEEZE OR SHORTNESS OF BREATH 18 each 2   vitamin B-12 100 MCG tablet Take 1 tablet (100 mcg total) by mouth daily. 30 tablet 0   predniSONE (DELTASONE) 10 MG tablet Take 1 tablet (10 mg total) by mouth daily with breakfast. 30 tablet 1   predniSONE (DELTASONE) 10 MG tablet 4 tabs for 3 days, then 3 tabs for 3 days, 2 tabs for 3 days, then start 1 tab daily (Patient not taking: Reported on 04/20/2022) 30 tablet 0   Facility-Administered Medications Prior to Visit  Medication Dose Route Frequency Provider Last Rate Last Admin   ipratropium-albuterol (DUONEB) 0.5-2.5 (3) MG/3ML nebulizer solution 3 mL  3 mL Nebulization Once [place] Lebeau, Karie Schwalbe, NP       methylPREDNISolone acetate (DEPO-MEDROL) injection 80 mg  80 mg Intramuscular Once Angeni Chaudhuri, Karie Schwalbe, NP         Review of Systems:   Constitutional: No weight loss or gain, night sweats, fevers,chills. +fatigue HEENT: +dental decay. No headaches, difficulty swallowing, or sore throat. No sneezing, itching, ear ache, nasal congestion, postnasal  drip CV:  No chest pain, orthopnea, PND, swelling in lower extremities, anasarca, dizziness, palpitations, syncope Resp: +shortness of breath with exertion; improving minimally productive cough; wheezing. No chest wall deformity. No hemoptysis. GI:  No heartburn, indigestion, abdominal pain, nausea, vomiting, diarrhea, change in bowel habits, loss of appetite, bloody stools.  GU: No dysuria, change in color of urine, urgency or frequency.  No flank pain, no hematuria  Skin: No rash, lesions, ulcerations MSK:  No joint pain or swelling.  No decreased range of motion.  No back pain. Neuro: No dizziness or lightheadedness.  Psych: No depression or anxiety. Mood stable.     Physical Exam:  BP 126/74   Pulse 81   Ht _0  (1.626 m)   Wt 192 lb (87.1 kg)   SpO2 98% Comment: 2L  BMI 32.96 kg/m   GEN: Pleasant, interactive, chronically ill appearing; in no acute distress. HEENT:  Normocephalic and atraumatic. PERRLA. Sclera white. Nasal turbinates pink, moist and patent bilaterally. No rhinorrhea present. Oropharynx pink and moist, without exudate or edema. Edentulous with dental decay present. No lesions, ulcerations NECK:  Supple w/ fair ROM. No JVD present. Normal carotid impulses w/o bruits. Thyroid symmetrical with no goiter or nodules palpated. No lymphadenopathy.   CV: RRR, no m/r/g, no peripheral edema. Pulses intact, +2 bilaterally. No cyanosis, pallor or clubbing. PULMONARY:  Unlabored, regular breathing. Improved airflow; expiratory wheezes b/l. On 2L O2. No accessory muscle use. No dullness to percussion. GI: BS present and normoactive. Soft, non-tender to palpation. No organomegaly or masses detected.  MSK: No erythema, warmth or tenderness. No deformities or joint swelling noted.  Neuro: A/Ox3. No focal deficits noted.   Skin: Warm, no lesions or rashe Psych: Normal affect and behavior. Judgement and thought content appropriate.     Lab Results:  CBC    Component Value  Date/Time   WBC 8.2 04/13/2022 1506   RBC 5.20 (H) 04/13/2022 1506   HGB 16.1 (H) 04/13/2022 1506   HCT 48.4 (H) 04/13/2022 1506   PLT 248.0 04/13/2022 1506   MCV 93.1 04/13/2022 1506   MCH 21.3 (L) 11/21/2020 0111   MCHC 33.2 04/13/2022 1506   RDW 15.4 04/13/2022 1506   LYMPHSABS 2.8 04/13/2022 1506   MONOABS 0.9 04/13/2022 1506   EOSABS 0.3 04/13/2022 1506   BASOSABS 0.1 04/13/2022 1506    BMET    Component Value Date/Time   NA 139 04/13/2022 1506   K 3.9 04/13/2022 1506   CL 102 04/13/2022 1506   CO2 30 04/13/2022 1506   GLUCOSE 121 (H) 04/13/2022 1506   BUN 5 (L) 04/13/2022 1506   CREATININE 0.57 04/13/2022 1506   CREATININE 0.75 08/31/2015 1259   CALCIUM 9.2 04/13/2022 1506   GFRNONAA >60 11/21/2020 0111   GFRNONAA >89 08/31/2015 1259   GFRAA >60 12/26/2019 1645   GFRAA >89 08/31/2015 1259    BNP    Component Value Date/Time   BNP 60.3 11/20/2020 0120     Imaging:  CT CHEST HIGH RESOLUTION  Result Date: 04/13/2022 CLINICAL DATA:  Interstitial lung disease, question organizing pneumonia. EXAM: CT CHEST WITHOUT CONTRAST TECHNIQUE: Multidetector CT imaging of the chest was performed following the standard protocol without intravenous contrast. High resolution imaging of the lungs, as well as inspiratory and expiratory imaging, was performed. RADIATION DOSE REDUCTION: This exam was performed according to the departmental dose-optimization program which includes automated exposure control, adjustment of the mA and/or kV according to patient size and/or use of iterative reconstruction technique. COMPARISON:  09/01/2021, PET 09/01/2021, CT chest 05/07/2021, 11/12/2020. FINDINGS: Cardiovascular: Atherosclerotic calcification of the aorta. Enlarged pulmonic trunk and heart. No pericardial effusion. Mediastinum/Nodes: Mediastinal lymph nodes measure up to 9 mm in the low right paratracheal station. No pathologically enlarged mediastinal lymph nodes. Hilar regions are difficult  to definitively evaluate without IV contrast. No axillary adenopathy. Esophagus is grossly unremarkable. Distal periesophageal lymph node measures 7 mm, unchanged. Lungs/Pleura: Paraseptal emphysema. Peribronchovascular and subpleural ground-glass in with a slightly nodular appearance and possibly minimally basilar predominant. Findings are similar to 05/07/2021. Mild residual scarring in the inferior right middle lobe (6/100). 5 mm peripheral right lower lobe nodule (3 x 6 mm, 6/110), slightly decreased from 7 mm on 05/07/2021. 4 mm apical segment right upper lobe nodule (6/28), unchanged from 05/07/2021. Probable 6 mm subpleural lymph node in the lateral left lower lobe (6/96), unchanged from 05/07/2021. Per Fleischner Society guidelines, no follow-up is necessary. Expiratory phase imaging was not performed in true expiration, limiting the evaluation for air trapping. Upper Abdomen: Liver is mildly heterogeneous in attenuation. Visualized portions of the liver, adrenal glands, kidneys, spleen, pancreas, stomach and bowel are otherwise grossly unremarkable. No upper abdominal adenopathy. Musculoskeletal: No worrisome lytic or sclerotic lesions. IMPRESSION: 1. Pulmonary parenchymal pattern of interstitial lung disease may be postinfectious/postinflammatory in etiology, including due to COVID-19 pneumonia, in the appropriate clinical setting. Otherwise, mild fibrotic hypersensitivity pneumonitis is considered. Findings are suggestive of an alternative diagnosis (not UIP) per consensus guidelines: Diagnosis of Idiopathic Pulmonary Fibrosis: An Official ATS/ERS/JRS/ALAT Clinical Practice Guideline. [place], Iss 5, [phone], Feb 04 2017. 2. Liver appears mildly steatotic. 3.  Aortic atherosclerosis (ICD10-I70.0). 4. Enlarged pulmonic trunk, indicative of pulmonary arterial hypertension. 5.  Emphysema (ICD10-J43.9). Electronically Signed   By: Lorin Picket M.D.   On: 04/13/2022 09:00   DG  Chest 2 View  Result Date: 04/01/2022 CLINICAL DATA:  Productive cough, history of pneumonia EXAM: CHEST - 2 VIEW COMPARISON:  03/15/2022 FINDINGS: Frontal and lateral views of the chest demonstrates stable enlargement the cardiac silhouette. There are bilateral interstitial and ground-glass opacities, greatest at the lung bases, more pronounced than on the preceding exam. No effusion or pneumothorax. No acute bony abnormalities. IMPRESSION: 1. Continued waxing and waning appearance of the bibasilar interstitial and ground-glass opacities, with interval worsening since the 03/15/2022 exam. Findings could reflect sequela of chronic recurrent aspiration, atypical infection such as MAC, or sequela of underlying chronic interstitial lung disease such as UIP based on prior chest CT 09/01/2021. Electronically Signed   By: Randa Ngo M.D.   On: 04/01/2022 16:25    albuterol (PROVENTIL) (2.5 MG/3ML) 0.083% nebulizer solution 2.5 mg     Date Action Dose Route User   03/15/2022 1100 Given 2.5 mg Nebulization (Right Ventrogluteal) Nolon Stalls, Kimber Relic, RN      methylPREDNISolone acetate (DEPO-MEDROL) injection 80 mg     Date Action Dose Route User   04/13/2022 1514 Given 80 mg Intramuscular (Left Ventrogluteal) Priscille Kluver, CMA           Latest Ref Rng & Units 06/24/2021    3:50 PM  PFT Results  FVC-Pre L 2.21   FVC-Predicted Pre % 64   FVC-Post L 2.28   FVC-Predicted Post % 66   Pre FEV1/FVC % % 89   Post FEV1/FCV % % 88   FEV1-Pre L 1.96   FEV1-Predicted Pre % 72   FEV1-Post L 2.00   DLCO uncorrected ml/min/mmHg 13.79   DLCO UNC% % 67   DLCO corrected ml/min/mmHg 13.79   DLCO COR %Predicted % 67   DLVA Predicted % 80   TLC L 3.95   TLC % Predicted % 79   RV % Predicted % 97     No results found for: "NITRICOXIDE"      Assessment & Plan:   COPD with acute exacerbation (HCC) Unresolved AECOPD. She has had improvement in her breathing and cough is clearing up. She did  not complete previous prednisone taper as directed. She would prefer not to take doses higher than 20 mg. Still with bronchospasm on exam. We will treat her with 20 mg for 5 days and then she will transition to 10 mg daily until she returns. Previous allergen panel negative; concerned for allergic triggers. She needs to quit smoking.   Patient Instructions  -Continue Flonase nasal spray 2 sprays each nostril daily until symptoms improve then 1 spray daily -Continue Mucinex over the counter Twice daily  -Continue Symbicort 2 puffs Twice daily. Brush tongue and rinse mouth afterwards. Use with spacer -Continue spiriva 2 puffs daily -Continue Albuterol inhaler 2 puffs or 3 mL neb every 6 hours as needed for shortness of breath or wheezing. Notify if symptoms persist despite rescue inhaler/neb use. Use neb up to twice a day until symptoms improve  -Continue omeprazole 1 tab daily  -Continue supplemental oxygen 2 lpm for goal >88-90%  Prednisone 20 mg daily for 5 days then transition to 10 mg daily. Take in AM with food.   -Nicotine patches - 14 mcg patch, apply 1 daily for 6 weeks then decrease to 7 mcg patch daily. Work on quitting smoking in the meantime.  Goal quit date 05/02/2022  Check thyroid level today   Follow up as scheduled with Dr. Lamonte Sakai on 12/20. If symptoms do not improve or worsen,  please contact office for sooner follow up or seek emergency care.    ILD (interstitial lung disease) ([place]) She was seen 10/10 and treated with augmentin course for CAP with RLL opacity on CXR, fevers, and productive cough. She attended follow up 10/27 with improved but persistent symptoms so repeat CXR was obtained, which showed worsening opacities. Concern for recurrent pneumonia vs chronic infection vs ILD; also possible RB-ILD or COP? HRCT obtained - possible postinfectious/postinflammatory scarring vs NSIP. Not UIP. Stable when compared to 05/2021. She has had previous autoimmune workup in June 2022,  which was unremarkable. Hypersensitivity panel negative. We did order a swallow study, which has yet to be completed - she is on numerous sedating medications so need to rule out aspiration. Never collected previous sputum cultures. She is slowly improving; still with residual AECOPD - see above.  Chronic respiratory failure with hypoxia (HCC) Stable without increased oxygen requirement. Goal >88-90%   Tobacco abuse Working on quitting. Wearing nicotine patches. Goal quit date 11/27.   Fatigue Likely postinfectious/related to slow to resolve AECOPD. She is concerned about her thyroid as a contributing factor and would like this checked. We will obtain TSH today. Encouraged her to work on slowly increasing activity, as tolerated.      I spent 38 minutes of dedicated to the care of this patient on the date of this encounter to include pre-visit review of records, face-to-face time with the patient discussing conditions above, post visit ordering of testing, clinical documentation with the electronic health record, making appropriate referrals as documented, and communicating necessary findings to members of the patients care team.  Clayton Bibles, NP 04/22/2022  Pt aware and understands NP's role.

## 2022-04-21 ENCOUNTER — Telehealth (HOSPITAL_COMMUNITY): Payer: Self-pay

## 2022-04-21 NOTE — Progress Notes (Signed)
Please notify patient her thyroid level was normal. Thanks.

## 2022-04-21 NOTE — Telephone Encounter (Signed)
Attempted to contact patient to schedule Modified Barium Swallow - left voicemail. 

## 2022-04-22 ENCOUNTER — Encounter: Payer: Self-pay | Admitting: Nurse Practitioner

## 2022-04-22 DIAGNOSIS — R5383 Other fatigue: Secondary | ICD-10-CM | POA: Insufficient documentation

## 2022-04-22 NOTE — Assessment & Plan Note (Signed)
Stable without increased oxygen requirement. Goal >88-90%

## 2022-04-22 NOTE — Assessment & Plan Note (Signed)
Likely postinfectious/related to slow to resolve AECOPD. She is concerned about her thyroid as a contributing factor and would like this checked. We will obtain TSH today. Encouraged her to work on slowly increasing activity, as tolerated.

## 2022-04-22 NOTE — Assessment & Plan Note (Addendum)
Unresolved AECOPD. She has had improvement in her breathing and cough is clearing up. She did not complete previous prednisone taper as directed. She would prefer not to take doses higher than 20 mg. Still with bronchospasm on exam. We will treat her with 20 mg for 5 days and then she will transition to 10 mg daily until she returns. Previous allergen panel negative; concerned for allergic triggers. She needs to quit smoking.   Patient Instructions  -Continue Flonase nasal spray 2 sprays each nostril daily until symptoms improve then 1 spray daily -Continue Mucinex over the counter Twice daily  -Continue Symbicort 2 puffs Twice daily. Brush tongue and rinse mouth afterwards. Use with spacer -Continue spiriva 2 puffs daily -Continue Albuterol inhaler 2 puffs or 3 mL neb every 6 hours as needed for shortness of breath or wheezing. Notify if symptoms persist despite rescue inhaler/neb use. Use neb up to twice a day until symptoms improve  -Continue omeprazole 1 tab daily  -Continue supplemental oxygen 2 lpm for goal >88-90%  Prednisone 20 mg daily for 5 days then transition to 10 mg daily. Take in AM with food.   -Nicotine patches - 14 mcg patch, apply 1 daily for 6 weeks then decrease to 7 mcg patch daily. Work on quitting smoking in the meantime.  Goal quit date 05/02/2022  Check thyroid level today   Follow up as scheduled with Dr. Lamonte Sakai on 12/20. If symptoms do not improve or worsen, please contact office for sooner follow up or seek emergency care.

## 2022-04-22 NOTE — Assessment & Plan Note (Addendum)
She was seen 10/10 and treated with augmentin course for CAP with RLL opacity on CXR, fevers, and productive cough. She attended follow up 10/27 with improved but persistent symptoms so repeat CXR was obtained, which showed worsening opacities. Concern for recurrent pneumonia vs chronic infection vs ILD; also possible RB-ILD or COP? HRCT obtained - possible postinfectious/postinflammatory scarring vs NSIP. Not UIP. Stable when compared to 05/2021. She has had previous autoimmune workup in June 2022, which was unremarkable. Hypersensitivity panel negative. We did order a swallow study, which has yet to be completed - she is on numerous sedating medications so need to rule out aspiration. Never collected previous sputum cultures. She is slowly improving; still with residual AECOPD - see above.

## 2022-04-22 NOTE — Assessment & Plan Note (Signed)
Working on quitting. Wearing nicotine patches. Goal quit date 11/27.

## 2022-04-28 IMAGING — CT NM PET TUM IMG INITIAL (PI) SKULL BASE T - THIGH
7 series · 25 of 25 positions shown · non-contrast
Comparison: Multiple exams, including chest CT 05/07/2021

CLINICAL DATA: Initial treatment strategy for right subpleural lung
mass/consolidation.

EXAM:
NUCLEAR MEDICINE PET SKULL BASE TO THIGH
TECHNIQUE: 9.4 mCi F-18 FDG was injected intravenously. Full-ring PET imaging
was performed from the skull base to thigh after the radiotracer. CT
data was obtained and used for attenuation correction and anatomic
localization.
Fasting blood glucose: 127 mg/dl

[Series 3: pet sk_thigh ac · axial · 5.0mm · 4.07mm/px · z∈[-869,+7]mm · 6 of 220 slices shown]
[im 1/220]
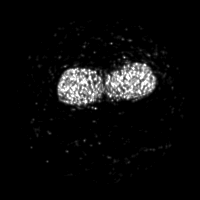
[im 44/220]
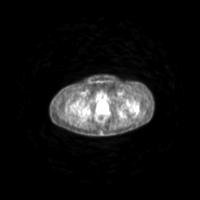
[im 88/220]
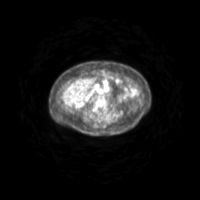
[im 132/220]
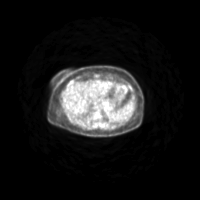
[im 176/220]
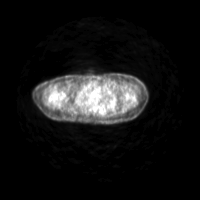
[im 220/220]
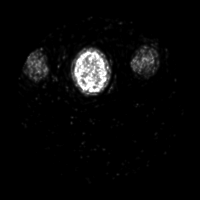

[Series 4: ct sk_thigh 5.0 bf37 · axial · 5.0mm · 0.98mm/px · z∈[-869,+7]mm · 6 of 220 slices shown]
[im 1/220]
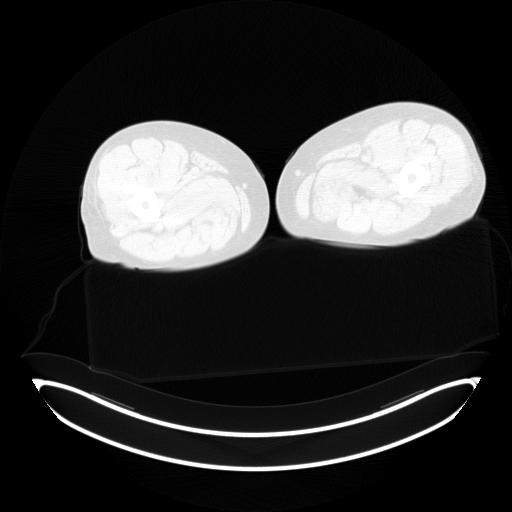
[im 44/220]
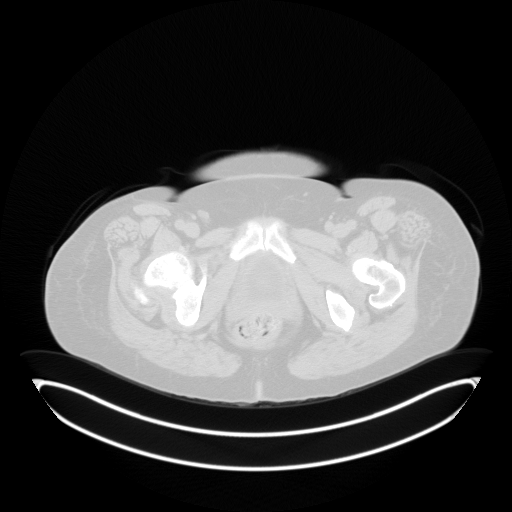
[im 88/220]
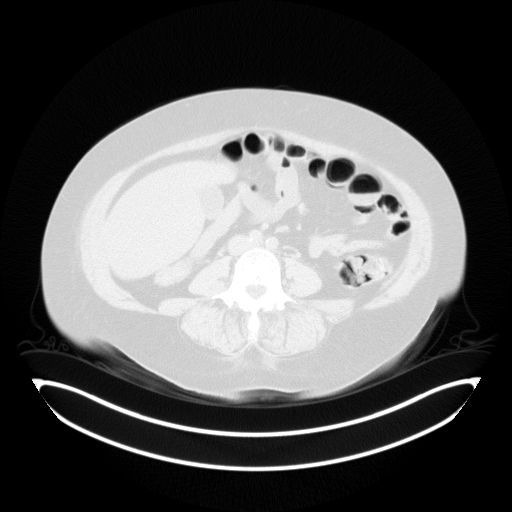
[im 132/220]
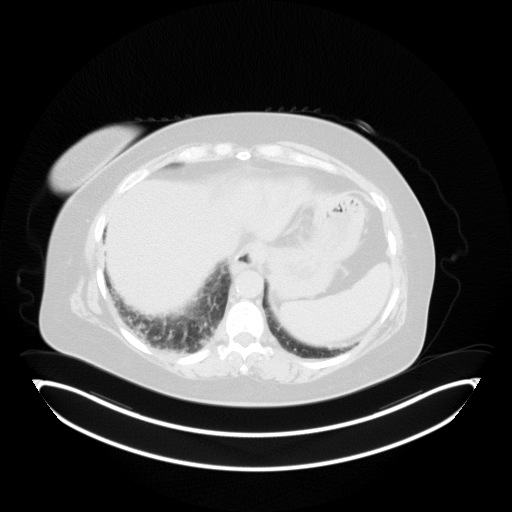
[im 176/220]
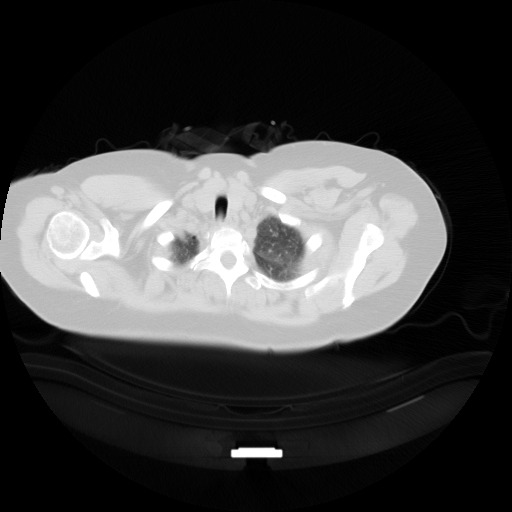
[im 220/220  brain]
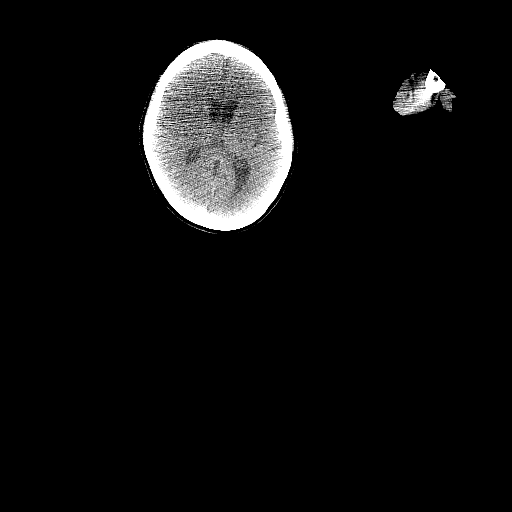

[Series 5: pet sk_thigh nac · axial · 5.0mm · 4.07mm/px · z∈[-869,+7]mm · 5 of 220 slices shown]
[im 1/220]
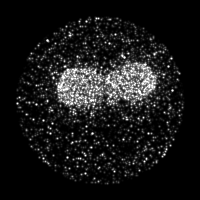
[im 55/220]
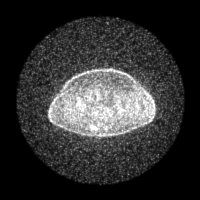
[im 110/220]
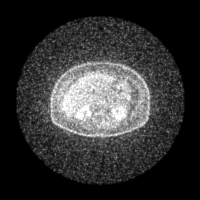
[im 165/220]
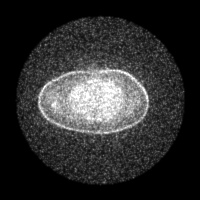
[im 220/220]
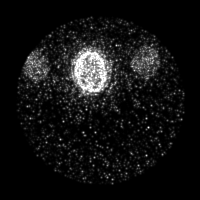

[Series 8: ct sk_thigh 5.0 br59 lung_bone · axial · 5.0mm · 0.71mm/px · 1 of 60 slices shown]
[im 1/60]
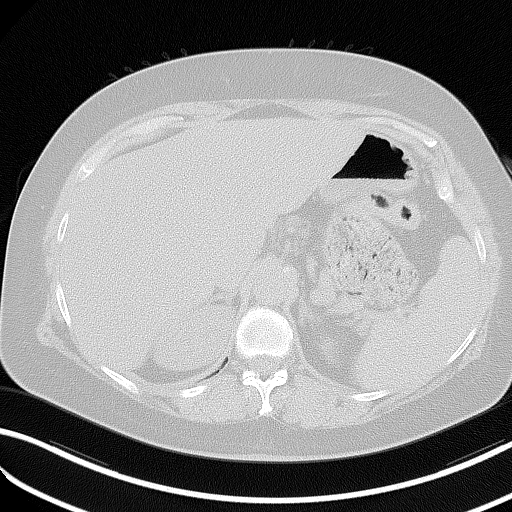

[Series 603: <mip collection> · coronal · 1.82mm/px · 1 of 32 slices shown]
[im 1/32]
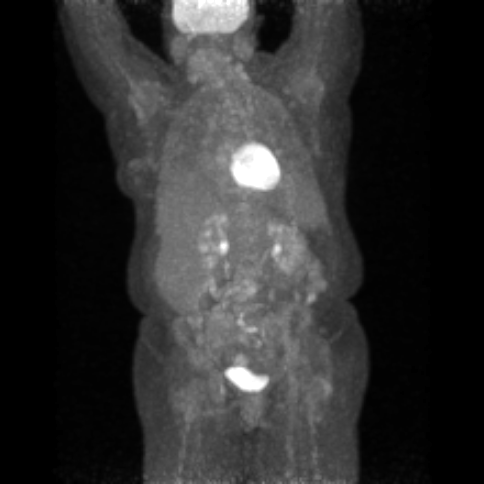

[Series 604: fused cor · 1 of 57 slices shown]
[im 1/57]
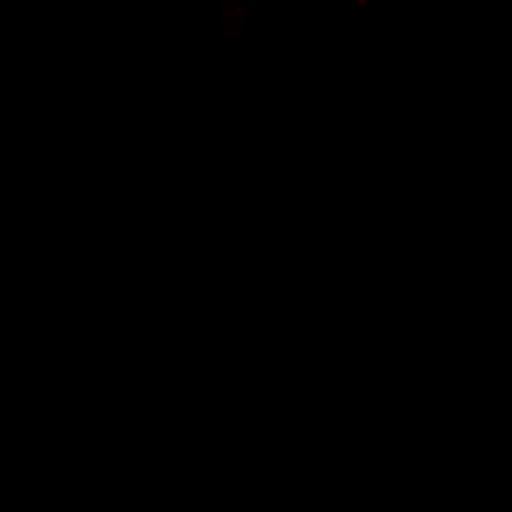

[Series 605: range-ct sk_thigh 5.0 bf37-tra-<alpha range> · 5 of 215 slices shown]
[im 1/215]
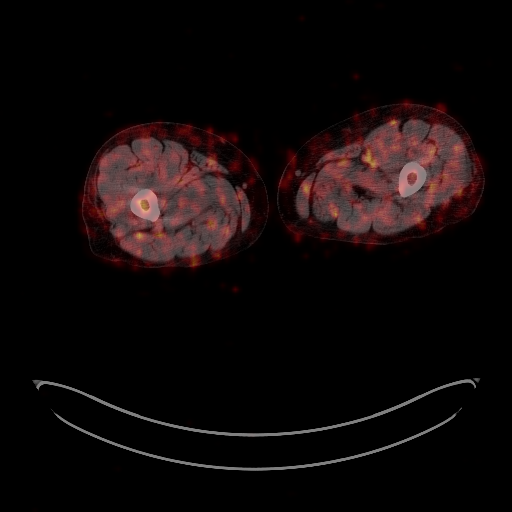
[im 54/215]
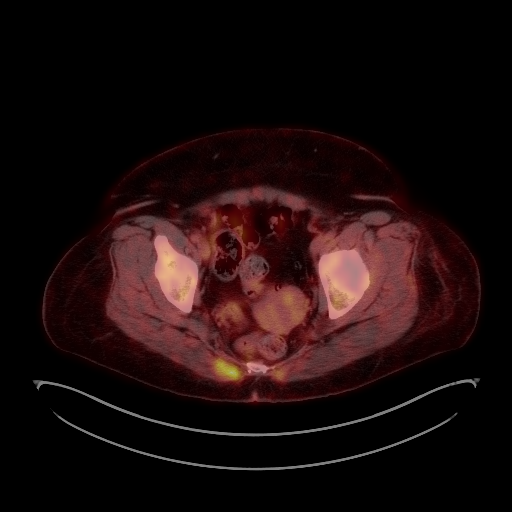
[im 108/215]
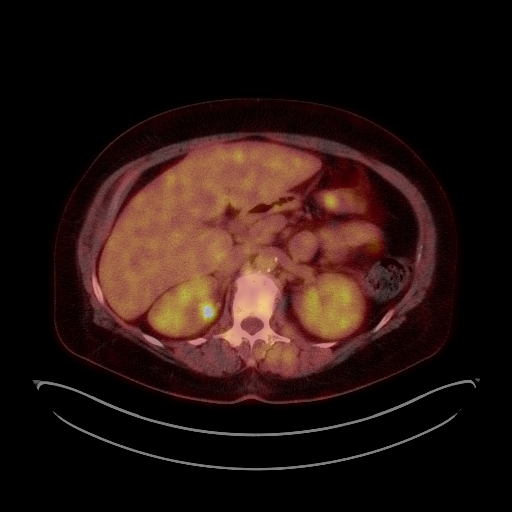
[im 161/215]
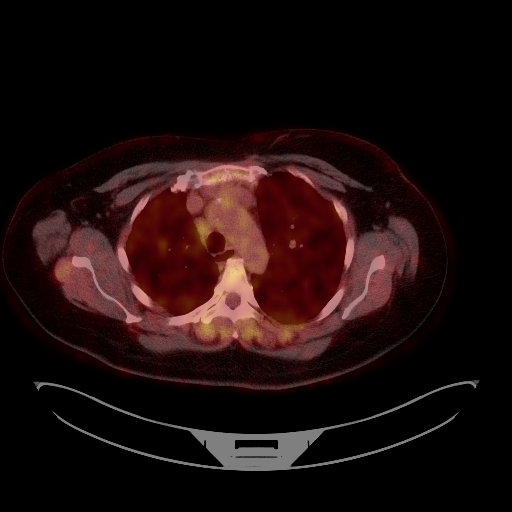
[im 215/215]
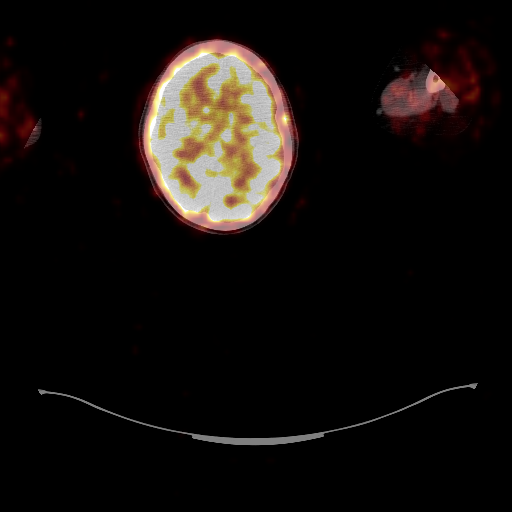

[25 of 25 positions shown; findings below may reference images not displayed]

FINDINGS: Mediastinal blood pool activity: SUV max

Liver activity: SUV max NA

NECK: No significant abnormal hypermetabolic activity in this
region.

Incidental CT findings: none

CHEST: The previous 4.1 by 3.3 cm right middle lobe subpleural
masslike lesion has nearly completely resolved, with only a residual
1.8 by 0.6 cm band of density in this vicinity as shown on image 43
of series 8. Maximum SUV in the region 1.6. The appearance is most
compatible with a benign etiology.

Right lower paratracheal node 1.2 cm in short axis on image 57 of
series 4 (stable), maximum SUV 2.9. Right eccentric subcarinal node
1.6 cm in short axis on image 68 series 4 (formerly 1.7 cm), maximum
SUV 3.8.

There is diffuse background activity in the lungs, representative
activity posteriorly in the right lower lobe with maximum SUV 2.0,
similar to blood pool level activity.

The 6 mm left lower lobe nodule shown on image 35 of series 8 near
the diaphragm is not hypermetabolic compared to the surrounding lung
but is below sensitive PET-CT size thresholds.

Incidental CT findings: Mild cardiomegaly. Peripheral interstitial
accentuation and hazy ground-glass densities in both lungs are noted
and likely inflammatory. Paraseptal emphysema noted.

ABDOMEN/PELVIS: Physiologic activity in bowel. Incidental focal
activity in the right gluteus maximus muscle medially without
associated CT abnormality, likely physiologic/incidental.

Incidental CT findings: Stable hepatomegaly with the liver measuring
26.5 cm craniocaudad. Atherosclerosis is present, including
aortoiliac atherosclerotic disease. Diffuse hepatic steatosis.
Nonspecific mild wall thickening in the ascending colon without
substantial accentuated metabolic activity.

SKELETON: No significant abnormal hypermetabolic activity in this
region.

Incidental CT findings: Grade 1 anterolisthesis at L4-5. Grade 1
retrolisthesis at L1-2 with endplate sclerosis at the L1-2 level.
Lower cervical plate and screw fixator.
IMPRESSION: 1. The subpleural mass anteriorly in the right middle lobe has
nearly completely resolved, with only a small residual band of
density without substantial hypermetabolic activity in this region.
Appearance compatible with benign etiology.
2. Mildly enlarged subcarinal and right lower paratracheal lymph
nodes are mildly hypermetabolic. Nonspecific appearance could be
reactive or due to low-grade malignancy.
3. Hazy ground-glass densities in the lungs with peripheral
interstitial accentuation and diffuse background activity in the
lungs, probably from chronic inflammation.
4. The 6 mm left lower lobe nodule near the lateral costophrenic
angle is not hypermetabolic but is below sensitive PET-CT size
thresholds.
5. Mild wall thickening is suggested in the ascending colon but
without substantial accentuated metabolic activity, potentially
incidental or low-grade colitis.
6. Other imaging findings of potential clinical significance: Aortic
Atherosclerosis (R6GY3-48U.U) and Emphysema ([id number].Q). Mild
cardiomegaly. Stable hepatomegaly. Lumbar spondylosis.

## 2022-05-03 ENCOUNTER — Telehealth: Payer: Self-pay | Admitting: Emergency Medicine

## 2022-05-03 MED ORDER — CLARITHROMYCIN 250 MG PO TABS: 250.0000 mg | ORAL_TABLET | Freq: Two times a day (BID) | ORAL | 0 refills | Status: DC

## 2022-05-03 NOTE — Telephone Encounter (Signed)
Spoke with Fritz Pickerel and reviewed Dr. Agustina Caroli advise including medication instruction. Fritz Pickerel stated understanding. Nothing further needed at this time.

## 2022-05-03 NOTE — Telephone Encounter (Signed)
Spoke with pt who verified verbally it is ok to speak with Fritz Pickerel (husband). Fritz Pickerel states pt has had increased SOB, increased fatigue, low grade tempeture, sore throat and dry cough for 4 - 5 days. Fritz Pickerel states pt is currently taking 10 mg of prednisone daily. Fritz Pickerel denies pt has GI upset and states pt's at home Covid test was negative. Pharmacy is [facility] on [address]. Fritz Pickerel advised if symptoms get worse pt needs to seek evaluation at [facility] or ED. Dr. Lamonte Sakai please advise.

## 2022-05-03 NOTE — Telephone Encounter (Signed)
Ok to try clarithromycin '250mg'$  bid x 7 days.  If not improving then get her in to be seen by APP

## 2022-05-12 ENCOUNTER — Ambulatory Visit (HOSPITAL_BASED_OUTPATIENT_CLINIC_OR_DEPARTMENT_OTHER): Payer: Medicaid Other | Admitting: Family Medicine

## 2022-05-25 ENCOUNTER — Encounter: Payer: Self-pay | Admitting: Emergency Medicine

## 2022-05-25 ENCOUNTER — Ambulatory Visit (INDEPENDENT_AMBULATORY_CARE_PROVIDER_SITE_OTHER): Payer: Medicaid Other

## 2022-05-25 ENCOUNTER — Ambulatory Visit (INDEPENDENT_AMBULATORY_CARE_PROVIDER_SITE_OTHER): Payer: Medicaid Other | Admitting: Emergency Medicine

## 2022-05-25 VITALS — BP 130/74 | HR 96 | Temp 98.4°F | Ht 64.0 in | Wt 191.2 lb

## 2022-05-25 DIAGNOSIS — J189 Pneumonia, unspecified organism: Secondary | ICD-10-CM | POA: Diagnosis not present

## 2022-05-25 DIAGNOSIS — J411 Mucopurulent chronic bronchitis: Secondary | ICD-10-CM | POA: Diagnosis not present

## 2022-05-25 DIAGNOSIS — J9611 Chronic respiratory failure with hypoxia: Secondary | ICD-10-CM | POA: Diagnosis not present

## 2022-05-25 DIAGNOSIS — J84115 Respiratory bronchiolitis interstitial lung disease: Secondary | ICD-10-CM | POA: Diagnosis not present

## 2022-05-25 MED ORDER — LEVOFLOXACIN 500 MG PO TABS: 500.0000 mg | ORAL_TABLET | Freq: Every day | ORAL | 0 refills | Status: DC

## 2022-05-25 NOTE — Assessment & Plan Note (Signed)
CT chest and clinical findings consistent with RB-ILD.  Now with more dyspnea, more cough in setting of a superimposed URI (COVID-negative).  At high risk for developing pneumonia.  She does have some COPD and is at risk for acute flare although no overt wheezing on exam today, only rhonchi.  She tolerates prednisone poorly so I will hold off on prednisone taper.  Start her on Levaquin.  If she is not improving in the next 2 days she is to call us, may require hospital admission.  Most importantly she did stop smoking.  I underscored the importance of this with her since it is directly related to the RB-ILD.  If after this acute flare she continues to have progressive symptoms, progression on imaging then she may need steroid sparing anti-inflammatory therapy

## 2022-05-25 NOTE — Assessment & Plan Note (Signed)
No real wheeze on exam today but she does have diffuse inspiratory rhonchi.  Plan to continue her Symbicort and Spiriva as ordered.  Albuterol as needed.  Hold off on prednisone for now since she tolerates poorly

## 2022-05-25 NOTE — Progress Notes (Signed)
Subjective:    Patient ID: Adriana Hall, female    DOB: 05/04/1971, 51 y.o.   MRN: [id number]  HPI  ROV 11/09/21 --51 year old active smoker (50+ pack years) with COPD, prior severe right-sided pneumonia with associated parapneumonic effusion.  Also with scattered groundglass pulmonary infiltrates, question RB ILD.  Her most recent chest imaging was reassuring, consistent with resolving pneumonia.  She was seen in mid May for increased cough, chest tightness, sputum production.  Treated for an acute exacerbation of COPD with levofloxacin, prednisone. She returns today reporting that she is feeling better - has residual hoarse, has seen white plaquing in her throat    ROV 05/25/2022 --follow-up visit 51 year old woman with history of active tobacco use (50+ pack years).  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  She has been treated in the past for severe right-sided pneumonia with associated parapneumonic effusion.  She stopped smoking 4 days ago, previously had been averaging about.  She was seen in October and November in our office for flaring symptoms including cough, congestion, treated with steroids and antibiotics.  She was tried on an extended course of prednisone 10 mg as well.  CT chest was done 04/11/2022 as below. She tolerates prednisone poorly - causes some agitation and mood swings. She caught a URI 5 days ago, COVID negative.   High-resolution CT chest 04/11/2022 reviewed by me shows no pathologically enlarged lymph nodes, mediastinal nodes up to 9 mm, paraseptal emphysema with parabronchial vascular subpleural groundglass infiltrate, base predominant, little change compared with 05/2021, principally an NSIP pattern   Review of Systems As per HPI  Past Medical History:  Diagnosis Date   Anxiety    10 years   Arrhythmia    5 years   Bipolar 1 disorder, depressed ([place])    Bronchitis    Colitis 03-01-2011   Colonoscopy    Depression    10 years   Headache(784.0)    Chronic for 30 yrs   Hemorrhoids 03-01-2011   Colonoscopy    Hypertension      Family History  Problem Relation Age of Onset   Pancreatic cancer Maternal Grandfather    Liver cancer Maternal Grandfather    Clotting disorder Mother    Heart disease Mother    Diabetes Paternal Grandmother        And PGF   Other Father        Brain tumor   Colon cancer Neg Hx      Social History   Socioeconomic History   Marital status: Married    Spouse name: Not on file   Number of children: 3   Years of education: Not on file   Highest education level: Not on file  Occupational History   Occupation: Self employed  Tobacco Use   Smoking status: Every Day    Packs/day: 3.00    Years: 30.00    Total pack years: 90.00    Types: Cigarettes   Smokeless tobacco: Never   Tobacco comments:    Pt stated she quit 4 days ago. 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      Home Situation: lives with husband and daughter      Spiritual Beliefs: Christian      Lifestyle: no regular CV exercise; diet is ok            Social Determinants of Radio broadcast assistant Strain: Not on file  Food Insecurity: Not on file  Transportation Needs: Not on file  Physical Activity: Not on file  Stress: Not on file  Social Connections: Not on file  Intimate Partner Violence: Not on file     Allergies  Allergen Reactions   Biotin     'Makes me extremely sick'   Doxycycline Nausea And  Vomiting     Outpatient Medications Prior to Visit  Medication Sig Dispense Refill   acetaminophen (TYLENOL) 325 MG tablet Take 2 tablets (650 mg total) by mouth every 6 (six) hours as needed for mild pain (or Fever >/= 101). 30 tablet 0   ALPRAZolam (XANAX) 0.5 MG tablet Take 0.5 mg by mouth as needed.     budesonide-formoterol (SYMBICORT) 160-4.5 MCG/ACT inhaler Inhale 2 puffs into the lungs in the morning and at bedtime. 10.2 g 11   buprenorphine-naloxone (SUBOXONE) 8-2 mg SUBL SL tablet Place 1 tablet under the tongue in the morning and at bedtime.     clarithromycin (BIAXIN) 250 MG tablet Take 1 tablet (250 mg total) by mouth 2 (two) times daily. 14 tablet 0   diphenhydrAMINE (BENADRYL) 25 MG tablet Take 50 mg by mouth at bedtime.     ferrous sulfate 325 (65 FE) MG tablet Take 1 tablet (325 mg total) by mouth daily with breakfast. 30 tablet 0   fluticasone (FLONASE) 50 MCG/ACT nasal spray Place 1 spray into both nostrils daily. 18.2 mL 2   fluvoxaMINE (LUVOX) 100 MG tablet TAKE 1 TABLET BY MOUTH AT BEDTIME FOR 6 DAYS,THEN CONTINUE TAKING 2 TABLETS ('100MG'$ ) AT BEDTIME 30 tablet 1   folic acid (FOLVITE) 1 MG tablet Take 1 tablet (1 mg total) by mouth daily. 30 tablet 0   gabapentin (NEURONTIN) 400 MG capsule Take 1 capsule (400 mg total) by mouth 3 (three) times daily. 90 capsule 2   hydrOXYzine (ATARAX) 25 MG tablet Take 1 tablet (25 mg total) by mouth 3 (three) times daily as needed for anxiety. 90 tablet 2   ibuprofen (ADVIL) 200 MG tablet Take 400 mg by mouth every 6 (six) hours as needed for fever or moderate pain.     ipratropium-albuterol (DUONEB) 0.5-2.5 (3) MG/3ML SOLN Take 3 mLs by nebulization every 6 (six) hours as needed. 360 mL 12   nicotine (NICODERM CQ - DOSED IN MG/24 HR) 7 mg/24hr patch Place 1 patch (7 mg total) onto the skin daily for 14 days. 14 patch 0   nitroGLYCERIN (NITROLINGUAL) 0.4 MG/SPRAY spray Place 1 spray under the tongue once as needed for chest pain. 12 g 0    Nutritional Supplements (ESTROVEN PO) Take 1 tablet by mouth daily.     OLANZapine (ZYPREXA) 10 MG tablet Take 1 tablet (10 mg total) by mouth at bedtime. 30 tablet 2   omeprazole (PRILOSEC) 40 MG capsule Take 40 mg by mouth daily.     ondansetron (ZOFRAN) 4 MG tablet TAKE 1 TABLET BY MOUTH EVERY 8 HOURS AS NEEDED FOR  NAUSEA AND VOMITING 30 tablet 0   Spacer/Aero-Holding Chambers (AEROCHAMBER MV) inhaler Use as instructed 1 each 2   Tiotropium Bromide Monohydrate (SPIRIVA RESPIMAT) 2.5 MCG/ACT AERS Inhale 2 puffs into the lungs daily. 4 g 11   traZODone (DESYREL) 100 MG tablet TAKE 2 TABLETS (200 MG TOTAL) BY MOUTH AT BEDTIME AS NEEDED FOR SLEEP. 60 tablet 2   VENTOLIN HFA 108 (90 Base) MCG/ACT inhaler TAKE 2 PUFFS BY MOUTH EVERY 6 HOURS AS NEEDED FOR WHEEZE OR SHORTNESS OF BREATH 18 each 2   vitamin B-12 100 MCG tablet Take 1 tablet (100 mcg total) by mouth daily. 30 tablet 0   predniSONE (DELTASONE) 10 MG tablet Take 1 tablet (10 mg total) by mouth daily with breakfast. Start after you complete the 20 mg daily for 5 days. (Patient not taking: Reported on 05/25/2022) 30 tablet 0   Facility-Administered Medications Prior to Visit  Medication Dose Route Frequency Provider Last Rate Last Admin   ipratropium-albuterol (DUONEB) 0.5-2.5 (3) MG/3ML nebulizer solution 3 mL  3 mL Nebulization Once Cobb, Karie Schwalbe, NP             Objective:   Physical Exam Vitals:   05/25/22 1346  BP: 130/74  Pulse: 96  Temp: 98.4 F (36.9 C)  TempSrc: Oral  SpO2: 93%  Weight: 191 lb 3.2 oz (86.7 kg)  Height: '5\' 4"'$  (1.626 m)   Gen: Pleasant,ill-appearing woman, in no distress,  normal affect, O2 in place  ENT: No lesions,  mouth clear,  oropharynx clear, no postnasal drip,  Neck: No JVD, no stridor  Lungs: No use of accessory muscles, coarse bilateral inspiratory rhonchi, no overt wheezing  Cardiovascular: RRR, heart sounds normal, no murmur or gallops, trace B ankle peripheral  edema  Musculoskeletal: No deformities, no cyanosis or clubbing  Neuro: alert, awake, non focal  Skin: Warm, no lesions or rash      Assessment & Plan:   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  Now with more dyspnea, more cough in setting of a superimposed URI (COVID-negative).  At high risk for developing pneumonia.  She does have some COPD and is at risk for acute flare although no overt wheezing on exam today, only rhonchi.  She tolerates prednisone poorly so I will hold off on prednisone taper.  Start her on Levaquin.  If she is not improving in the next 2 days she is to call us, may require hospital admission.  Most importantly she did stop smoking.  I underscored the importance of this with her since it is directly related to the RB-ILD.  If after this acute flare she continues to have progressive symptoms, progression on imaging then she may need steroid sparing anti-inflammatory therapy  COPD (chronic obstructive pulmonary disease) (HCC) No real wheeze on exam today but she does have diffuse inspiratory rhonchi.  Plan to continue her Symbicort and Spiriva as ordered.  Albuterol as needed.  Hold off on prednisone for now since she tolerates poorly  Chronic respiratory failure with hypoxia ([place]) Continue oxygen as ordered    Baltazar Apo, MD, PhD 05/25/2022, 2:08 PM [facility] [phone] or if no answer before 7:00PM call [phone] For any issues after 7:00PM please call eLink [phone]

## 2022-05-25 NOTE — Assessment & Plan Note (Signed)
Continue oxygen as ordered

## 2022-05-25 NOTE — Patient Instructions (Signed)
Chest x-ray today Please take Levaquin 500 milligrams once daily for 7 days. We will avoid starting any prednisone for now since you do not tolerate this well. Use over-the-counter symptom relief like Tylenol Cold and Flu Continue your Symbicort 2 puffs twice a day.  Rinse and gargle after using. Continue Spiriva once daily. Keep albuterol available to use 2 puffs if needed for shortness of breath, chest tightness, wheezing. Please call our office by Friday if you are not beginning to improve.  You may need to be evaluated in the emergency department or admitted to the hospital. Congratulations on stopping smoking.  Do everything possible to avoid restarting Follow with Adriana Hall in 2 months or sooner if you have any problems.

## 2022-06-27 ENCOUNTER — Encounter (HOSPITAL_BASED_OUTPATIENT_CLINIC_OR_DEPARTMENT_OTHER): Payer: Self-pay

## 2022-06-28 ENCOUNTER — Ambulatory Visit (HOSPITAL_BASED_OUTPATIENT_CLINIC_OR_DEPARTMENT_OTHER): Payer: Medicaid Other | Admitting: Family Medicine

## 2022-07-04 ENCOUNTER — Other Ambulatory Visit: Payer: Self-pay | Admitting: Nurse Practitioner

## 2022-07-04 DIAGNOSIS — J441 Chronic obstructive pulmonary disease with (acute) exacerbation: Secondary | ICD-10-CM

## 2022-07-21 ENCOUNTER — Ambulatory Visit: Payer: Medicaid Other | Admitting: Emergency Medicine

## 2022-08-22 ENCOUNTER — Ambulatory Visit (HOSPITAL_BASED_OUTPATIENT_CLINIC_OR_DEPARTMENT_OTHER): Payer: Medicaid Other | Admitting: Family Medicine

## 2022-08-22 ENCOUNTER — Encounter (HOSPITAL_BASED_OUTPATIENT_CLINIC_OR_DEPARTMENT_OTHER): Payer: Self-pay | Admitting: Family Medicine

## 2022-08-22 VITALS — BP 143/62 | HR 83 | Ht 64.0 in | Wt 195.1 lb

## 2022-08-22 DIAGNOSIS — B3749 Other urogenital candidiasis: Secondary | ICD-10-CM | POA: Diagnosis not present

## 2022-08-22 DIAGNOSIS — B372 Candidiasis of skin and nail: Secondary | ICD-10-CM

## 2022-08-22 DIAGNOSIS — J449 Chronic obstructive pulmonary disease, unspecified: Secondary | ICD-10-CM | POA: Diagnosis not present

## 2022-08-22 DIAGNOSIS — Z7689 Persons encountering health services in other specified circumstances: Secondary | ICD-10-CM

## 2022-08-22 DIAGNOSIS — R030 Elevated blood-pressure reading, without diagnosis of hypertension: Secondary | ICD-10-CM | POA: Insufficient documentation

## 2022-08-22 DIAGNOSIS — F419 Anxiety disorder, unspecified: Secondary | ICD-10-CM

## 2022-08-22 DIAGNOSIS — F3181 Bipolar II disorder: Secondary | ICD-10-CM

## 2022-08-22 DIAGNOSIS — R1011 Right upper quadrant pain: Secondary | ICD-10-CM

## 2022-08-22 DIAGNOSIS — I456 Pre-excitation syndrome: Secondary | ICD-10-CM

## 2022-08-22 DIAGNOSIS — K589 Irritable bowel syndrome without diarrhea: Secondary | ICD-10-CM

## 2022-08-22 DIAGNOSIS — B370 Candidal stomatitis: Secondary | ICD-10-CM

## 2022-08-22 DIAGNOSIS — Z0000 Encounter for general adult medical examination without abnormal findings: Secondary | ICD-10-CM

## 2022-08-22 MED ORDER — NYSTATIN 100000 UNIT/ML MT SUSP: 500000.0000 [IU] | Freq: Four times a day (QID) | OROMUCOSAL | 0 refills | Status: DC

## 2022-08-22 MED ORDER — NYSTATIN POWD: 1.0000 | Freq: Two times a day (BID) | 2 refills | Status: DC

## 2022-08-22 NOTE — Assessment & Plan Note (Addendum)
Patient has elevated systolic blood pressure in the office today. She reports infrequent chest discomfort and swelling in her lower extremities. During her episodes of chest discomfort, she reports they resolve without any medical intervention. She denies chest heaviness, pressure or tightness. No red flags present on physical exam. She has no palpitations, dizziness, or episodes of lightheadedness.  Denies feeling of her heart racing or palpitations. Advised her to monitor BP at home. Will continue to monitor BP readings in office and assess at-home readings next visit.

## 2022-08-22 NOTE — Assessment & Plan Note (Signed)
Patient has a history of COPD and is followed closely by pulmonology.  Patient is taking daily Spiriva and Symbicort inhalers.  She is using Ventolin as needed, some days she does not need it and others she uses it 2-3 times during the day.  Patient does have a history of acute on chronic COPD exacerbation and is at high risk for developing pneumonia.  She does not appear to be having an acute flare today.  No signs of infection present at today's visit. Reports that she has a chronic cough, but no cough heard during visit. Inspiratory and expiratory wheezing appreciated during exam with rhonchi present in the bases.  She reports that she started smoking (half pack per day) again due to family stressors.  She appears to tolerate being on 2 L of oxygen currently.  Has an appointment with pulmonology on 08/26/2022.
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
## 2022-08-22 NOTE — Assessment & Plan Note (Addendum)
Patient reports history of Wolff-Parkinson-White syndrome (WPW) and had an ablation in the past "many years ago." Denies palpitations, diaphoresis, lightheadedness, dizziness, difficulty breathing. Patient reports she "always" is short of breath at rest and with exertion. Reports chest discomfort and swelling in her lower extremities. She would like a referral to cardiology to have this assessed. Referral placed.

## 2022-08-23 ENCOUNTER — Telehealth (HOSPITAL_BASED_OUTPATIENT_CLINIC_OR_DEPARTMENT_OTHER): Payer: Self-pay | Admitting: Family Medicine

## 2022-08-23 DIAGNOSIS — B3749 Other urogenital candidiasis: Secondary | ICD-10-CM | POA: Insufficient documentation

## 2022-08-23 DIAGNOSIS — B372 Candidiasis of skin and nail: Secondary | ICD-10-CM | POA: Insufficient documentation

## 2022-08-23 NOTE — Assessment & Plan Note (Signed)
Patient is followed and medically managed by [facility].

## 2022-08-23 NOTE — Assessment & Plan Note (Deleted)
Skin under breasts, under pannus, and in the inguinal groove on either side of the pubic area present with redness. Most likely related to fungal infection. Placed order for nystatin powder to be applied to affected areas 2-3 times per day until healing occurs.

## 2022-08-23 NOTE — Assessment & Plan Note (Signed)
Patient reports redness and itching under her breasts and abdomen that are sometimes painful. Skin under breasts, under pannus, and in the inguinal groove on either side of the pubic area present with redness. No lesions or open wounds present. Most likely related to fungal infection. Placed order for nystatin powder to be applied to affected areas 2-3 times per day until healing occurs. Advised patient to attempt to keep these areas free of excess moisture, wear loose fitting clothing, and application of the nystatin powder until it resolves.

## 2022-08-23 NOTE — Assessment & Plan Note (Addendum)
Patient presents with subacute/chronic abdominal pain in both the right and left upper quadrants.  She describes this pain as a dull, aching that is intermittent. She reports 2 episodes of emesis this past week, associated with nausea. Reports she is not consuming as much food as she normally does but does have an appetite. Denies fever, chills, unexplained weight loss, changes in bowel habits, intractable vomiting, dysuria, urinary frequency, hematuria, recent antibiotic use, severe pain, diarrhea, or syncopal episodes.  Denies pain associated with eating or food aversion. Based on reviewing of medical history, she does have a history of IBS and was seeing GI in the past (most recent GI notes are 2014). Physical exam reveals tenderness present in the RUQ and LUQ. No rebound tenderness present.  Less concerned for appendicitis due to lack severe pain migrating from periumbilical area to the RLQ and due to extended symptom duration.  Initial testing will include CMP to assess liver function. Maybe add on amylase and lipase to determine pancreatic and liver involvement. Due to time limitation of visit, would like to see patient back for her complaints of subacute/chronic abdominal pain for further assessment. Advised patient that further work-up could involve imaging (possible abd [place]). She is also due for her colonoscopy. She is agreeable to plan.

## 2022-08-23 NOTE — Telephone Encounter (Signed)
Pt is calling she states that the Nausea medication was not called in..  Please advise

## 2022-08-23 NOTE — Assessment & Plan Note (Signed)
Patient is followed and medically managed by [facility]. She reports an increase in life stressors and is looking to discuss adjusting her medications with them next appointment.

## 2022-08-23 NOTE — Assessment & Plan Note (Signed)
Patient reports having issues with her Symbicort inhaler.  Reports an improvement since using the spacer with the inhaler. Physical exam reveals white patches on tongue, palate and buccal mucosa. No stomatitis or angular chelitis visualized. Will order oral nystatin suspension for her to take by mouth 4 times daily for 7-14 days.

## 2022-08-24 ENCOUNTER — Telehealth (HOSPITAL_BASED_OUTPATIENT_CLINIC_OR_DEPARTMENT_OTHER): Payer: Self-pay

## 2022-08-24 ENCOUNTER — Encounter (HOSPITAL_BASED_OUTPATIENT_CLINIC_OR_DEPARTMENT_OTHER): Payer: Self-pay | Admitting: Family Medicine

## 2022-08-24 MED ORDER — ONDANSETRON HCL 4 MG PO TABS: 4.0000 mg | ORAL_TABLET | Freq: Three times a day (TID) | ORAL | 0 refills | Status: DC | PRN

## 2022-08-24 NOTE — Telephone Encounter (Signed)
Called patient to check and see if she had labs completed. LVM to return call

## 2022-08-24 NOTE — Addendum Note (Signed)
Addended by: Les Pou on: 08/24/2022 11:21 AM   Modules accepted: Orders

## 2022-08-24 NOTE — Telephone Encounter (Signed)
Patient returning call. Spoke about labs, and nausea. Will send in a weeks worth of nausea medication, patient is scheduled to come in office Friday to address this.

## 2022-08-26 ENCOUNTER — Ambulatory Visit (HOSPITAL_BASED_OUTPATIENT_CLINIC_OR_DEPARTMENT_OTHER): Payer: Medicaid Other | Admitting: Family Medicine

## 2022-08-26 ENCOUNTER — Ambulatory Visit: Payer: Medicaid Other | Admitting: Emergency Medicine

## 2022-09-06 ENCOUNTER — Other Ambulatory Visit (HOSPITAL_BASED_OUTPATIENT_CLINIC_OR_DEPARTMENT_OTHER): Payer: Medicaid Other

## 2022-09-14 ENCOUNTER — Ambulatory Visit (INDEPENDENT_AMBULATORY_CARE_PROVIDER_SITE_OTHER): Payer: Medicaid Other | Admitting: Family Medicine

## 2022-09-14 ENCOUNTER — Encounter (HOSPITAL_BASED_OUTPATIENT_CLINIC_OR_DEPARTMENT_OTHER): Payer: Self-pay | Admitting: Family Medicine

## 2022-09-14 VITALS — BP 148/74 | HR 85 | Ht 64.0 in | Wt 195.0 lb

## 2022-09-14 DIAGNOSIS — G8929 Other chronic pain: Secondary | ICD-10-CM | POA: Diagnosis not present

## 2022-09-14 DIAGNOSIS — M545 Low back pain, unspecified: Secondary | ICD-10-CM

## 2022-09-14 DIAGNOSIS — R5382 Chronic fatigue, unspecified: Secondary | ICD-10-CM | POA: Diagnosis not present

## 2022-09-14 DIAGNOSIS — R1011 Right upper quadrant pain: Secondary | ICD-10-CM
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
## 2022-09-15 LAB — CBC WITH DIFFERENTIAL/PLATELET
Basophils Absolute: 0.1 10*3/uL (ref 0.0–0.2)
Basos: 1 %
EOS (ABSOLUTE): 0.2 10*3/uL (ref 0.0–0.4)
Eos: 2 %
Hematocrit: 47.6 % — ABNORMAL HIGH (ref 34.0–46.6)
Hemoglobin: 15.7 g/dL (ref 11.1–15.9)
Immature Grans (Abs): 0.1 10*3/uL (ref 0.0–0.1)
Immature Granulocytes: 1 %
Lymphocytes Absolute: 2.4 10*3/uL (ref 0.7–3.1)
Lymphs: 31 %
MCH: 30.7 pg (ref 26.6–33.0)
MCHC: 33 g/dL (ref 31.5–35.7)
MCV: 93 fL (ref 79–97)
Monocytes Absolute: 0.7 10*3/uL (ref 0.1–0.9)
Monocytes: 9 %
Neutrophils Absolute: 4.3 10*3/uL (ref 1.4–7.0)
Neutrophils: 56 %
Platelets: 227 10*3/uL (ref 150–450)
RBC: 5.12 x10E6/uL (ref 3.77–5.28)
RDW: 14.2 % (ref 11.7–15.4)
WBC: 7.8 10*3/uL (ref 3.4–10.8)

## 2022-09-15 LAB — LIPID PANEL
Chol/HDL Ratio: 5.9 ratio — ABNORMAL HIGH (ref 0.0–4.4)
Cholesterol, Total: 214 mg/dL — ABNORMAL HIGH (ref 100–199)
HDL: 36 mg/dL — ABNORMAL LOW (ref >39)
LDL Chol Calc (NIH): 132 mg/dL — ABNORMAL HIGH (ref 0–99)
Triglycerides: 259 mg/dL — ABNORMAL HIGH (ref 0–149)
VLDL Cholesterol Cal: 46 mg/dL — ABNORMAL HIGH (ref 5–40)

## 2022-09-15 LAB — HEMOGLOBIN A1C
Est. average glucose Bld gHb Est-mCnc: 140 mg/dL
Hgb A1c MFr Bld: 6.5 % — ABNORMAL HIGH (ref 4.8–5.6)

## 2022-09-15 LAB — GAMMA GT: GGT: 288 IU/L — ABNORMAL HIGH (ref 0–60)

## 2022-09-15 LAB — TSH RFX ON ABNORMAL TO FREE T4: TSH: 2.09 u[IU]/mL (ref 0.450–4.500)

## 2022-09-20 ENCOUNTER — Other Ambulatory Visit (HOSPITAL_BASED_OUTPATIENT_CLINIC_OR_DEPARTMENT_OTHER): Payer: Self-pay | Admitting: Family Medicine

## 2022-09-20 ENCOUNTER — Ambulatory Visit: Payer: Medicaid Other | Admitting: Emergency Medicine

## 2022-09-21 LAB — COMPREHENSIVE METABOLIC PANEL
ALT: 37 IU/L — ABNORMAL HIGH (ref 0–32)
AST: 47 IU/L — ABNORMAL HIGH (ref 0–40)
Albumin/Globulin Ratio: 1.4 (ref 1.2–2.2)
Albumin: 4.1 g/dL (ref 3.8–4.9)
Alkaline Phosphatase: 121 IU/L (ref 44–121)
BUN/Creatinine Ratio: 8 — ABNORMAL LOW (ref 9–23)
BUN: 6 mg/dL (ref 6–24)
Bilirubin Total: <0.2 mg/dL (ref 0.0–1.2)
CO2: 21 mmol/L (ref 20–29)
Calcium: 9.5 mg/dL (ref 8.7–10.2)
Chloride: 98 mmol/L (ref 96–106)
Creatinine, Ser: 0.79 mg/dL (ref 0.57–1.00)
Globulin, Total: 3 g/dL (ref 1.5–4.5)
Glucose: 132 mg/dL — ABNORMAL HIGH (ref 70–99)
Potassium: 4.8 mmol/L (ref 3.5–5.2)
Sodium: 142 mmol/L (ref 134–144)
Total Protein: 7.1 g/dL (ref 6.0–8.5)
eGFR: 89 mL/min/{1.73_m2} (ref >59)

## 2022-09-21 LAB — AMYLASE: Amylase: 60 U/L (ref 31–110)

## 2022-09-21 LAB — LIPASE: Lipase: 24 U/L (ref 14–72)

## 2022-09-21 LAB — SPECIMEN STATUS REPORT

## 2022-09-26 ENCOUNTER — Encounter (HOSPITAL_BASED_OUTPATIENT_CLINIC_OR_DEPARTMENT_OTHER): Payer: Self-pay

## 2022-09-27 ENCOUNTER — Ambulatory Visit (HOSPITAL_BASED_OUTPATIENT_CLINIC_OR_DEPARTMENT_OTHER): Payer: Medicaid Other | Admitting: Family Medicine

## 2022-10-05 ENCOUNTER — Ambulatory Visit (HOSPITAL_BASED_OUTPATIENT_CLINIC_OR_DEPARTMENT_OTHER): Payer: Medicaid Other | Admitting: Cardiology

## 2022-10-10 ENCOUNTER — Encounter (HOSPITAL_BASED_OUTPATIENT_CLINIC_OR_DEPARTMENT_OTHER): Payer: Self-pay | Admitting: Family Medicine

## 2022-10-10 ENCOUNTER — Ambulatory Visit (INDEPENDENT_AMBULATORY_CARE_PROVIDER_SITE_OTHER): Payer: Medicaid Other | Admitting: Family Medicine

## 2022-10-10 VITALS — BP 147/68 | HR 79 | Ht 64.0 in | Wt 200.3 lb

## 2022-10-10 DIAGNOSIS — M545 Low back pain, unspecified: Secondary | ICD-10-CM

## 2022-10-10 DIAGNOSIS — R21 Rash and other nonspecific skin eruption: Secondary | ICD-10-CM | POA: Insufficient documentation

## 2022-10-10 DIAGNOSIS — Z7984 Long term (current) use of oral hypoglycemic drugs: Secondary | ICD-10-CM

## 2022-10-10 DIAGNOSIS — E119 Type 2 diabetes mellitus without complications: Secondary | ICD-10-CM

## 2022-10-10 DIAGNOSIS — G8929 Other chronic pain: Secondary | ICD-10-CM

## 2022-10-10 DIAGNOSIS — R600 Localized edema: Secondary | ICD-10-CM | POA: Insufficient documentation

## 2022-10-10 LAB — POCT URINALYSIS DIPSTICK
Bilirubin, UA: NEGATIVE
Glucose, UA: NEGATIVE
Ketones, UA: NEGATIVE
Leukocytes, UA: NEGATIVE
Nitrite, UA: NEGATIVE
Protein, UA: NEGATIVE
Spec Grav, UA: 1.015 (ref 1.010–1.025)
Urobilinogen, UA: 0.2 E.U./dL
pH, UA: 5.5 (ref 5.0–8.0)

## 2022-10-10 MED ORDER — METFORMIN HCL 500 MG PO TABS
500.0000 mg | ORAL_TABLET | Freq: Two times a day (BID) | ORAL | 3 refills | Status: DC
Start: 2022-10-10 — End: 2022-10-18

## 2022-10-10 NOTE — Progress Notes (Signed)
Acute Office Visit  Subjective:   Patient ID: Adriana Hall, female    DOB: [birth year], 52 y.o.   MRN: [id number]  Chief Complaint  Patient presents with   Leg Swelling    Bilateral, red and hot, ongoing for 2 months   Rash    Both arms, red/splotchy. Started on left arm 6 months, right arm 2 weeks. Tender   Brinnley Nill is a 52 yo female patient who presents for concerns about bilateral lower leg swelling.  She reports that she has experienced lower extremity edema for the past 2 months, but has noticed that the last 2 weeks, it has not improved. She states that she feels like her legs are achy and has pain in her feet. She is unable to dorsiflex or plantar flex her feet due to tightness in her legs from the swelling.   Reports she is baseline shob with activity and normally sleeps in an elevated position with a wedge pillow and 2 pillows on top of that.  Sometimes feels tachycardic. Intermittent RUQ pain still-- abd [place] scheduled for 5/31.  Not urinating as often, but staying hydrated. Reports no change in color.  Denies abdominal distention, bloating and N/V. Reports she drinks 2-3 beers during the day- but not daily.   Gained 60lbs and reports no change in her diet. She eats home cooked meals and consumes a large amount of fruits and vegetables.   Has issues with ambulation baseline due to her chronic back pain. She is taking Advil Q6 throughout the day and takes Tylenol and at least 1 Goody's powder packets in the evening.   09/14/2022: A1c 6.5- Denies chest pain, vision changes, polydipsia, polyphagia, polyuria.     Review of Systems  Constitutional:  Negative for malaise/fatigue and weight loss.  Eyes:  Negative for blurred vision and double vision.  Respiratory:  Positive for shortness of breath (with exertion (baseline for her)). Negative for cough and wheezing.   Cardiovascular:  Positive for orthopnea and leg swelling. Negative for chest pain, palpitations,  claudication and PND.  Gastrointestinal:  Positive for abdominal pain (intermittent RUQ pain). Negative for nausea and vomiting.  Genitourinary:  Negative for dysuria, frequency and urgency.  Musculoskeletal:  Positive for back pain (chronic). Negative for myalgias.  Skin:  Positive for rash.  Neurological:  Negative for dizziness, weakness and headaches.  Endo/Heme/Allergies:  Positive for polydipsia.  Psychiatric/Behavioral:  Positive for substance abuse (EtOH use). Negative for depression and suicidal ideas. The patient is not nervous/anxious and does not have insomnia.    Objective:    BP (!) 147/68   Pulse 79   Ht 5\' 4"  (1.626 m)   Wt 200 lb 4.8 oz (90.9 kg)   SpO2 97%   BMI 34.38 kg/m   Physical Exam Constitutional:      Appearance: Normal appearance.  Cardiovascular:     Rate and Rhythm: Normal rate and regular rhythm.     Pulses: Normal pulses.     Heart sounds: Normal heart sounds, S1 normal and S2 normal. Heart sounds not distant. No murmur heard.    No friction rub. No gallop. No S3 or S4 sounds.  Pulmonary:     Effort: Pulmonary effort is normal. Prolonged expiration present. No respiratory distress.     Breath sounds: Examination of the right-upper field reveals wheezing. Examination of the left-upper field reveals wheezing. Decreased breath sounds and wheezing present.  Musculoskeletal:     Right lower leg: 1+ Pitting Edema present.  Left lower leg: 1+ Pitting Edema present.  Neurological:     Mental Status: She is alert.    Assessment & Plan:  1. Bilateral lower extremity edema Patient presents with bilateral lower extremity edema present for the past two months and reports that it has not improved within the past 2 weeks. Patient well-appearing and in no acute distress during visit. Cardiovascular exam with heart regular rate and rhythm. Normal heart sounds, no murmurs present. Bilateral lower extremity +1 pitting edema present in ankles. Bilateral wheezing  present in right and left upper lung lobes. Appears to tolerate being on 2L of oxygen. Denies shortness of breath at rest. No evidence of respiratory distress. Abdominal exam unremarkable. No abdominal distention present. Slight tenderness in RUQ- abd [place] scheduled for 11/04/22. Patient would benefit from cardiology evaluation/management- scheduled for 11/25/22. No echocardiogram performed in past. Will obtain labs today to determine if excess fluid is due to decreased liver function, kidney function, or cardiac function.  - POCT urinalysis dipstick - B Nat Peptide - Hepatic function panel - Renal Function Panel  2. Diabetes mellitus without complication (HCC) Discussed recent lab work. Discussed options, patient agreeable to start metformin 500mg  twice daily with meals. Educated about mechanism of action, common side effects, daily adherence to medication, and how often we can monitor hemoglobin A1c. Discussed monitoring fasting blood glucose level and normal fasting blood glucose levels. Plan to follow-up on medication tolerance in 4 weeks. Plan to recheck hemoglobin A1c 12/14/2022.   - metFORMIN (GLUCOPHAGE) 500 MG tablet; Take 1 tablet (500 mg total) by mouth 2 (two) times daily with a meal.  Dispense: 180 tablet; Refill: 3  3. Chronic midline low back pain without sciatica Patient reports having issues with her activities of daily living due to her chronic back pain. She reports using Advil and Tylenol for the past 3 years to help with pain management. She would like a referral to pain clinic for pain management. Agreed this would be beneficial for patient.  - Ambulatory referral to Pain Clinic  4. Rash Patient would like referral to dermatology. She has a rash on her bilateral upper extremities on each forearm. She reports that the rash has been there for about six months. She reports that sometimes her arms have an itching sensation. Referral placed to dermatology.  - Ambulatory referral to  Dermatology  Follow-up in 4 weeks at next scheduled appointment or sooner if symptoms worsen.   Alyson Reedy, FNP

## 2022-10-10 NOTE — Patient Instructions (Signed)
80-130 fasting blood sugar

## 2022-10-11 ENCOUNTER — Ambulatory Visit: Payer: Medicaid Other | Admitting: Emergency Medicine

## 2022-10-11 LAB — HEPATIC FUNCTION PANEL
ALT: 40 IU/L — ABNORMAL HIGH (ref 0–32)
AST: 57 IU/L — ABNORMAL HIGH (ref 0–40)
Alkaline Phosphatase: 119 IU/L (ref 44–121)
Bilirubin Total: 0.3 mg/dL (ref 0.0–1.2)
Bilirubin, Direct: 0.13 mg/dL (ref 0.00–0.40)
Total Protein: 7 g/dL (ref 6.0–8.5)

## 2022-10-11 LAB — RENAL FUNCTION PANEL
Albumin: 3.8 g/dL (ref 3.8–4.9)
BUN/Creatinine Ratio: 9 (ref 9–23)
BUN: 6 mg/dL (ref 6–24)
CO2: 22 mmol/L (ref 20–29)
Calcium: 9.3 mg/dL (ref 8.7–10.2)
Chloride: 101 mmol/L (ref 96–106)
Creatinine, Ser: 0.7 mg/dL (ref 0.57–1.00)
Glucose: 158 mg/dL — ABNORMAL HIGH (ref 70–99)
Phosphorus: 4.5 mg/dL — ABNORMAL HIGH (ref 3.0–4.3)
Potassium: 4 mmol/L (ref 3.5–5.2)
Sodium: 142 mmol/L (ref 134–144)
eGFR: 103 mL/min/{1.73_m2} (ref >59)

## 2022-10-11 LAB — BRAIN NATRIURETIC PEPTIDE: BNP: 40.1 pg/mL (ref 0.0–100.0)

## 2022-10-12 ENCOUNTER — Other Ambulatory Visit: Payer: Medicaid Other

## 2022-10-17 ENCOUNTER — Telehealth (HOSPITAL_BASED_OUTPATIENT_CLINIC_OR_DEPARTMENT_OTHER): Payer: Self-pay | Admitting: Family Medicine

## 2022-10-17 NOTE — Telephone Encounter (Signed)
The metformin is making the patient sick to stomach can she take something else?

## 2022-10-18 ENCOUNTER — Other Ambulatory Visit (HOSPITAL_BASED_OUTPATIENT_CLINIC_OR_DEPARTMENT_OTHER): Payer: Self-pay | Admitting: Family Medicine

## 2022-10-18 DIAGNOSIS — E119 Type 2 diabetes mellitus without complications: Secondary | ICD-10-CM

## 2022-10-18 MED ORDER — METFORMIN HCL ER 500 MG PO TB24
500.0000 mg | ORAL_TABLET | Freq: Every day | ORAL | 2 refills | Status: DC
Start: 2022-10-18 — End: 2022-11-16

## 2022-10-18 NOTE — Telephone Encounter (Signed)
Spoke with patient, gave a verbal understanding. Will update [place] if symptoms do not improve

## 2022-11-04 ENCOUNTER — Other Ambulatory Visit: Payer: Medicaid Other

## 2022-11-10 ENCOUNTER — Ambulatory Visit (HOSPITAL_BASED_OUTPATIENT_CLINIC_OR_DEPARTMENT_OTHER): Payer: Medicaid Other | Admitting: Family Medicine

## 2022-11-11 ENCOUNTER — Ambulatory Visit: Payer: Medicaid Other | Admitting: Primary Care

## 2022-11-14 ENCOUNTER — Ambulatory Visit (HOSPITAL_BASED_OUTPATIENT_CLINIC_OR_DEPARTMENT_OTHER): Payer: Medicaid Other | Admitting: Family Medicine

## 2022-11-14 NOTE — Progress Notes (Deleted)
   Established Patient Office Visit  Subjective   Patient ID: Tylia Ewell Buchanan, female    DOB: [birth year]  Age: 52 y.o. MRN: [id number]  No chief complaint on file.  Tavie Gaugh is a 52 year-old female patient who presents today for diabetes follow-up and current concerns about UTI.   09/14/2022: A1c 6.5 started immediate release metformin and switched her to extended-release metformin on 10/18/2022.     ROS    Objective:     There were no vitals taken for this visit. BP Readings from Last 3 Encounters:  10/10/22 (!) 147/68  09/14/22 (!) 148/74  08/22/22 (!) 143/62     Physical Exam    Assessment & Plan:  1. Diabetes mellitus without complication (HCC) ***   No follow-ups on file.    Alyson Reedy, FNP

## 2022-11-15 ENCOUNTER — Telehealth (HOSPITAL_BASED_OUTPATIENT_CLINIC_OR_DEPARTMENT_OTHER): Payer: Self-pay | Admitting: Family Medicine

## 2022-11-15 NOTE — Telephone Encounter (Signed)
Lvm to schedule diabetic retina screening on 6/20/245

## 2022-11-16 ENCOUNTER — Encounter (HOSPITAL_BASED_OUTPATIENT_CLINIC_OR_DEPARTMENT_OTHER): Payer: Self-pay | Admitting: Family Medicine

## 2022-11-16 ENCOUNTER — Ambulatory Visit (HOSPITAL_BASED_OUTPATIENT_CLINIC_OR_DEPARTMENT_OTHER): Payer: Medicaid Other | Admitting: Family Medicine

## 2022-11-16 VITALS — BP 137/77 | HR 86 | Ht 64.0 in | Wt 200.3 lb

## 2022-11-16 DIAGNOSIS — Z720 Tobacco use: Secondary | ICD-10-CM

## 2022-11-16 DIAGNOSIS — B372 Candidiasis of skin and nail: Secondary | ICD-10-CM | POA: Diagnosis not present

## 2022-11-16 DIAGNOSIS — F419 Anxiety disorder, unspecified: Secondary | ICD-10-CM | POA: Diagnosis not present

## 2022-11-16 DIAGNOSIS — Z122 Encounter for screening for malignant neoplasm of respiratory organs: Secondary | ICD-10-CM

## 2022-11-16 DIAGNOSIS — F3181 Bipolar II disorder: Secondary | ICD-10-CM

## 2022-11-16 DIAGNOSIS — E119 Type 2 diabetes mellitus without complications: Secondary | ICD-10-CM

## 2022-11-16 LAB — POCT GLUCOSE (DEVICE FOR HOME USE): POC Glucose: 150 mg/dl — AB (ref 70–99)

## 2022-11-16 MED ORDER — BLOOD GLUCOSE TEST VI STRP
1.0000 | ORAL_STRIP | Freq: Three times a day (TID) | 0 refills | Status: AC
Start: 2022-11-16 — End: 2022-12-16

## 2022-11-16 MED ORDER — NYSTATIN POWD
1.0000 | Freq: Three times a day (TID) | 2 refills | Status: DC | PRN
Start: 2022-11-16 — End: 2023-03-14

## 2022-11-16 MED ORDER — BLOOD GLUCOSE MONITORING SUPPL DEVI: 1.0000 | Freq: Three times a day (TID) | 0 refills | Status: AC

## 2022-11-16 MED ORDER — FLUVOXAMINE MALEATE 100 MG PO TABS: 100.0000 mg | ORAL_TABLET | Freq: Every day | ORAL | 0 refills | Status: DC

## 2022-11-16 MED ORDER — INTERDRY 10"X144" EX SHEE
1.0000 | MEDICATED_PATCH | Freq: Three times a day (TID) | CUTANEOUS | 3 refills | Status: AC
Start: 2022-11-16 — End: ?

## 2022-11-16 MED ORDER — LANCET DEVICE MISC: 1.0000 | Freq: Three times a day (TID) | 0 refills | Status: AC

## 2022-11-16 MED ORDER — FLUCONAZOLE 150 MG PO TABS
150.0000 mg | ORAL_TABLET | ORAL | 0 refills | Status: DC
Start: 2022-11-16 — End: 2023-03-14

## 2022-11-16 MED ORDER — LANCETS MISC. MISC
1.0000 | Freq: Three times a day (TID) | 0 refills | Status: AC
Start: 2022-11-16 — End: 2022-12-16

## 2022-11-16 MED ORDER — OZEMPIC (0.25 OR 0.5 MG/DOSE) 2 MG/3ML ~~LOC~~ SOPN
0.2500 mg | PEN_INJECTOR | SUBCUTANEOUS | 0 refills | Status: DC
Start: 2022-11-16 — End: 2022-12-20

## 2022-11-16 NOTE — Progress Notes (Signed)
Established Patient Office Visit  Subjective   Patient ID: Adriana Hall, female    DOB: [birth year]  Age: 52 y.o. MRN: [id number]  Chief Complaint  Patient presents with   Vaginitis    Ongoing for about 6 months, under L breast, pelvic area, inner thighs. Has tried all of the OTC monistats, and prescription powder   Adriana Hall is a 52 year-old female patient who presents today for diabetes follow-up and concern for skin irritation under breasts and in her groin region.   She reports that her home blood glucose monitor stopped working and she has been unable to monitor her sugars. She was unable to tolerate immediate-release and extended-release metformin. Reports she experienced significant nausea and multiple episodes of emesis. A1c last checked 4/10 with results at 6.5.   Rash- patient presents today with concerns for skin rash/irritation under both breasts (worse on the left side), in her groin region, and under her pannus. She was last seen on 3/18 for a similar rash- she reports the Nystatin powder did help improve the rash- however, once she stopped using this powder, she states that it returned and now it is worse than ever.   She reports she is no longer taking suboxone through [facility] in [place], [place]. She would like a referral to a new psychiatrist-- feels that her current psychiatrist is not listening to her concerns.  No concerns with medications at this time. Does need a refill of fluvoxamine 100mg  QHS.   Review of Systems  Constitutional:  Negative for chills, fever and malaise/fatigue.  Eyes:  Negative for blurred vision and double vision.  Respiratory:  Negative for cough and shortness of breath.   Cardiovascular:  Negative for chest pain and palpitations.  Gastrointestinal:  Positive for nausea (with metformin use) and vomiting (with metformin use). Negative for abdominal pain, constipation and diarrhea.  Genitourinary:  Negative for frequency and urgency.   Skin:  Positive for itching and rash.  Neurological:  Negative for dizziness and headaches.  Endo/Heme/Allergies:  Negative for polydipsia.  Psychiatric/Behavioral:  Negative for depression, hallucinations and suicidal ideas. The patient is not nervous/anxious and does not have insomnia.      Objective:    BP 137/77   Pulse 86   Ht 5\' 4"  (1.626 m)   Wt 200 lb 4.8 oz (90.9 kg)   SpO2 97%   BMI 34.38 kg/m  BP Readings from Last 3 Encounters:  11/16/22 137/77  10/10/22 (!) 147/68  09/14/22 (!) 148/74    Physical Exam Constitutional:      Appearance: Normal appearance.  Cardiovascular:     Rate and Rhythm: Normal rate and regular rhythm.     Pulses: Normal pulses.     Heart sounds: Normal heart sounds.  Pulmonary:     Effort: Prolonged expiration present. No respiratory distress.     Breath sounds: Decreased air movement present. Examination of the right-upper field reveals wheezing. Examination of the left-upper field reveals wheezing. Examination of the right-middle field reveals wheezing. Examination of the left-middle field reveals wheezing. Examination of the right-lower field reveals wheezing. Examination of the left-lower field reveals wheezing. Wheezing present.  Neurological:     Mental Status: She is alert.  Psychiatric:        Mood and Affect: Mood normal.        Behavior: Behavior normal.        Thought Content: Thought content normal.        Judgment: Judgment normal.  Assessment & Plan:   Diabetes mellitus without complication [facility]) Assessment & Plan: Patient presents today for diabetes follow-up. Her hemoglobin A1c was last checked on 09/14/2022 with results at 6.5. Prescribed metformin 500mg  daily; however, patient was unable to tolerate immediate-release and extended-release. POCT blood glucose performed today resulted at 150 mg/dL. Patient reports she is not fasting- she had [employer] prior to her appointment. Discussed lifestyle modifications- including  daily physical activity and healthy food choices. Patient is unable to participate in moderate aerobic exercise due to history of COPD and requiring supplemental oxygen therapy. Discussed options for diabetic medication management, patient would like to proceed with GLP-1 injectable. Provided patient with two sample pens of 0.25mg  semaglutide (Ozempic). Order sent to pharmacy. Educated patient to rotate injection sites, to inject on the same day each week, common side effects, and follow-up in 4 weeks.   Orders: -     Blood Glucose Monitoring Suppl; 1 each by Does not apply route in the morning, at noon, and at bedtime. May substitute to any manufacturer covered by patient's insurance.  Dispense: 1 each; Refill: 0 -     Blood Glucose Test; 1 each by In Vitro route in the morning, at noon, and at bedtime. May substitute to any manufacturer covered by patient's insurance.  Dispense: 100 strip; Refill: 0 -     Lancet Device; 1 each by Does not apply route in the morning, at noon, and at bedtime. May substitute to any manufacturer covered by patient's insurance.  Dispense: 1 each; Refill: 0 -     Lancets Misc.; 1 each by Does not apply route in the morning, at noon, and at bedtime. May substitute to any manufacturer covered by patient's insurance.  Dispense: 100 each; Refill: 0 -     POCT Glucose (Device for Home Use) -     Ozempic (0.25 or 0.5 MG/DOSE); Inject 0.25 mg into the skin once a week.  Dispense: 3 mL; Refill: 0  Candidal intertrigo Assessment & Plan: Patient presents today with recurrent rash present under her breasts, in her groin, and under her pannus. She presented with a similar rash in March and reports improvement with Nystatin powder. Patient reports once she stopped using Nystatin, her rash returned and became significantly worse. Most likely etiology is candidiasis. Due to severity, will prescribed patient oral fluconazole for her to take once weekly for 2-4 weeks. Advised her to continue  treatment if she does not notice improvement with 2 weeks of treatment. Educated patient with routine hygiene, using Nystatin or drying powder to prevent excess moisture accumulation, keeping skin folds clean and dry, and utilization of Interdry.   Orders: Arneta Cliche 16"X096"; Apply 1 each topically in the morning, at noon, and at bedtime.  Dispense: 1 each; Refill: 3 -     Nystatin; Apply 1 each topically 3 (three) times daily as needed. Apply liberally to affected area 3 times per day as needed.  Dispense: 1 each; Refill: 2 -     Fluconazole; Take 1 tablet (150 mg total) by mouth once a week.  Dispense: 4 tablet; Refill: 0  Anxiety Assessment & Plan: Patient has a history of anxiety. She was being followed by [facility] mental health providers. Patient reports she is no longer taking suboxone and does not want to continue her care with them. She is in need of a refill of fluvoxamine 100mg  daily at bedtime. Since patient has been on this medication, will provide patient will refill while  she tries to establish with new psychiatrist. Denies SI/HI at this time.   Orders: -     fluvoxaMINE Maleate; Take 1 tablet (100 mg total) by mouth at bedtime.  Dispense: 30 tablet; Refill: 0 -     Ambulatory referral to Psychiatry  Bipolar 2 disorder [facility]) Assessment & Plan: Patient currently taking olanzapine 10 mg at bedtime, gabapentin 400 mg 3 times daily, trazodone 200 mg at bedtime as needed, hydroxyzine 25 mg 3 times daily as needed, and fluvoxamine 100 mg at bedtime. Followed by [facility] psychiatry. However, patient wishes to establish with new psychiatrist. Urgent referral placed.   Orders: -     Ambulatory referral to Psychiatry  Tobacco abuse Assessment & Plan: Patient eligible for lung cancer screening. Will place order for lung cancer screening. Encouraged patient to follow-up with pulmonology.   Orders: -     Ambulatory Referral for Lung Cancer Scre  Screening for lung cancer -      Ambulatory Referral for Lung Cancer Scre    Return in about 4 weeks (around 12/14/2022) for weight loss & rash follow-up.    Alyson Reedy, FNP

## 2022-11-17 ENCOUNTER — Encounter (HOSPITAL_BASED_OUTPATIENT_CLINIC_OR_DEPARTMENT_OTHER): Payer: Self-pay | Admitting: Family Medicine

## 2022-11-17 NOTE — Assessment & Plan Note (Signed)
Patient has a history of anxiety. She was being followed by [facility] mental health providers. Patient reports she is no longer taking suboxone and does not want to continue her care with them. She is in need of a refill of fluvoxamine 100mg  daily at bedtime. Since patient has been on this medication, will provide patient will refill while she tries to establish with new psychiatrist. Denies SI/HI at this time.

## 2022-11-17 NOTE — Assessment & Plan Note (Addendum)
Patient currently taking olanzapine 10 mg at bedtime, gabapentin 400 mg 3 times daily, trazodone 200 mg at bedtime as needed, hydroxyzine 25 mg 3 times daily as needed, and fluvoxamine 100 mg at bedtime. Followed by [facility] psychiatry. However, patient wishes to establish with new psychiatrist. Urgent referral placed.

## 2022-11-17 NOTE — Assessment & Plan Note (Addendum)
Patient eligible for lung cancer screening. Will place order for lung cancer screening. Encouraged patient to follow-up with pulmonology.

## 2022-11-17 NOTE — Assessment & Plan Note (Signed)
Patient presents today with recurrent rash present under her breasts, in her groin, and under her pannus. She presented with a similar rash in March and reports improvement with Nystatin powder. Patient reports once she stopped using Nystatin, her rash returned and became significantly worse. Most likely etiology is candidiasis. Due to severity, will prescribed patient oral fluconazole for her to take once weekly for 2-4 weeks. Advised her to continue treatment if she does not notice improvement with 2 weeks of treatment. Educated patient with routine hygiene, using Nystatin or drying powder to prevent excess moisture accumulation, keeping skin folds clean and dry, and utilization of Interdry.

## 2022-11-17 NOTE — Assessment & Plan Note (Addendum)
Patient presents today for diabetes follow-up. Her hemoglobin A1c was last checked on 09/14/2022 with results at 6.5. Prescribed metformin 500mg  daily; however, patient was unable to tolerate immediate-release and extended-release. POCT blood glucose performed today resulted at 150 mg/dL. Patient reports she is not fasting- she had [employer] prior to her appointment. Discussed lifestyle modifications- including daily physical activity and healthy food choices. Patient is unable to participate in moderate aerobic exercise due to history of COPD and requiring supplemental oxygen therapy. Discussed options for diabetic medication management, patient would like to proceed with GLP-1 injectable. Provided patient with two sample pens of 0.25mg  semaglutide (Ozempic). Order sent to pharmacy. Educated patient to rotate injection sites, to inject on the same day each week, common side effects, and follow-up in 4 weeks.

## 2022-11-18 ENCOUNTER — Telehealth (HOSPITAL_BASED_OUTPATIENT_CLINIC_OR_DEPARTMENT_OTHER): Payer: Self-pay

## 2022-11-18 NOTE — Telephone Encounter (Signed)
Spoke with patient let her know that Adriana Hall ordered her lung cancer screening, also advised patient to follow up with Pulm due to her wheezing sounds at recent office visit. PCP recommended sending in a short treatment of prednisone, patient is agreeable to starting this. She is scheduled with Pulm for 07/01.

## 2022-11-23 ENCOUNTER — Other Ambulatory Visit (HOSPITAL_BASED_OUTPATIENT_CLINIC_OR_DEPARTMENT_OTHER): Payer: Self-pay | Admitting: Family Medicine

## 2022-11-23 MED ORDER — PREDNISONE 20 MG PO TABS: 20.0000 mg | ORAL_TABLET | Freq: Every day | ORAL | 0 refills | Status: AC

## 2022-11-24 ENCOUNTER — Other Ambulatory Visit (HOSPITAL_BASED_OUTPATIENT_CLINIC_OR_DEPARTMENT_OTHER): Payer: Self-pay

## 2022-11-24 ENCOUNTER — Telehealth (HOSPITAL_BASED_OUTPATIENT_CLINIC_OR_DEPARTMENT_OTHER): Payer: Self-pay | Admitting: Family Medicine

## 2022-11-24 MED ORDER — ONDANSETRON 4 MG PO TBDP: 4.0000 mg | ORAL_TABLET | Freq: Three times a day (TID) | ORAL | 0 refills | Status: DC | PRN

## 2022-11-24 NOTE — Telephone Encounter (Signed)
Spoke with patient, sending in Zofran per provider

## 2022-11-24 NOTE — Telephone Encounter (Signed)
Patient called stated just started medication injection which is causing nausea and wants to know if you can call something in for it.Adriana Hall

## 2022-11-25 ENCOUNTER — Ambulatory Visit (HOSPITAL_BASED_OUTPATIENT_CLINIC_OR_DEPARTMENT_OTHER): Payer: Medicaid Other | Admitting: Cardiology

## 2022-12-05 ENCOUNTER — Ambulatory Visit: Payer: Medicaid Other | Admitting: Nurse Practitioner

## 2022-12-06 ENCOUNTER — Telehealth (HOSPITAL_BASED_OUTPATIENT_CLINIC_OR_DEPARTMENT_OTHER): Payer: Self-pay | Admitting: Cardiology

## 2022-12-06 NOTE — Telephone Encounter (Signed)
Spoke with patient to reschedule the 01/27/23 2:40 pm appointment with D.r Cristal Deer (provider not in office)---rescheduled to Friday 02/03/23 at 2:40 pm.  Patient voiced her understanding.

## 2022-12-09 ENCOUNTER — Other Ambulatory Visit (HOSPITAL_BASED_OUTPATIENT_CLINIC_OR_DEPARTMENT_OTHER): Payer: Self-pay

## 2022-12-09 ENCOUNTER — Telehealth (HOSPITAL_BASED_OUTPATIENT_CLINIC_OR_DEPARTMENT_OTHER): Payer: Self-pay | Admitting: Family Medicine

## 2022-12-09 DIAGNOSIS — F3181 Bipolar II disorder: Secondary | ICD-10-CM

## 2022-12-09 MED ORDER — TRAZODONE HCL 100 MG PO TABS
200.0000 mg | ORAL_TABLET | Freq: Every evening | ORAL | 0 refills | Status: DC | PRN
Start: 2022-12-09 — End: 2023-01-19

## 2022-12-09 MED ORDER — GABAPENTIN 400 MG PO CAPS
400.0000 mg | ORAL_CAPSULE | Freq: Three times a day (TID) | ORAL | 0 refills | Status: AC
Start: 2022-12-09 — End: 2024-03-28

## 2022-12-09 MED ORDER — HYDROXYZINE PAMOATE 25 MG PO CAPS: 25.0000 mg | ORAL_CAPSULE | Freq: Three times a day (TID) | ORAL | 0 refills | Status: DC | PRN

## 2022-12-09 NOTE — Telephone Encounter (Signed)
All medications except xanax sent in, provider is out of office cannot send in. Psych referral resent in, after reviewing chart, previous referral was denied.

## 2022-12-09 NOTE — Telephone Encounter (Signed)
Patient asking to have meds refilled, also acknowledge appt on 7/10 and says she does not have enough meds til then. Trazodone Hydroxyzine Gabapentin alprazolam

## 2022-12-14 ENCOUNTER — Telehealth: Payer: Self-pay | Admitting: *Deleted

## 2022-12-14 ENCOUNTER — Ambulatory Visit (HOSPITAL_BASED_OUTPATIENT_CLINIC_OR_DEPARTMENT_OTHER): Payer: Medicaid Other | Admitting: Family Medicine

## 2022-12-14 ENCOUNTER — Other Ambulatory Visit: Payer: Self-pay | Admitting: *Deleted

## 2022-12-14 DIAGNOSIS — Z1212 Encounter for screening for malignant neoplasm of rectum: Secondary | ICD-10-CM

## 2022-12-14 DIAGNOSIS — Z1211 Encounter for screening for malignant neoplasm of colon: Secondary | ICD-10-CM

## 2022-12-14 NOTE — Telephone Encounter (Signed)
Called to schedule patient for colorectal screening. Pt agreed to referral to gastro. Referral placed to [facility]. Pt has no transportation issues at this time.

## 2022-12-15 ENCOUNTER — Ambulatory Visit: Payer: Medicaid Other | Admitting: Dermatology

## 2022-12-19 ENCOUNTER — Telehealth (HOSPITAL_BASED_OUTPATIENT_CLINIC_OR_DEPARTMENT_OTHER): Payer: Self-pay | Admitting: Family Medicine

## 2022-12-19 NOTE — Telephone Encounter (Signed)
Prescription Request  12/19/2022  LOV: 11/16/2022  What is the name of the medication or equipment? Fluxvoxamine 100mg   Have you contacted your pharmacy to request a refill? Yes   Which pharmacy would you like this sent to?  [id number] [place],  - [address] AT [facility] [address] [place] [address] Phone: [phone] Fax: [phone]  [id number] - [place], [place] - [address] AT [facility] [address] [place] [address] Phone: [phone] Fax: [phone]    Patient notified that their request is being sent to the clinical staff for review and that they should receive a response within 2 business days.   Please advise at [facility] [phone]

## 2022-12-19 NOTE — Telephone Encounter (Signed)
Called pt to reschedule appt and to followup for med refill

## 2022-12-19 NOTE — Telephone Encounter (Signed)
Spoke with [name] who is calling regarding medication for Adriana Hall, advised that the pt missed 2 appts, and the medication will be addressed at the visit

## 2022-12-19 NOTE — Telephone Encounter (Signed)
Provider will discuss with patient at visit 07/16

## 2022-12-20 ENCOUNTER — Encounter (HOSPITAL_BASED_OUTPATIENT_CLINIC_OR_DEPARTMENT_OTHER): Payer: Self-pay | Admitting: Family Medicine

## 2022-12-20 ENCOUNTER — Ambulatory Visit (INDEPENDENT_AMBULATORY_CARE_PROVIDER_SITE_OTHER): Payer: MEDICAID | Admitting: Family Medicine

## 2022-12-20 VITALS — BP 138/67 | HR 82 | Ht 64.0 in | Wt 199.0 lb

## 2022-12-20 DIAGNOSIS — E119 Type 2 diabetes mellitus without complications: Secondary | ICD-10-CM | POA: Diagnosis not present

## 2022-12-20 DIAGNOSIS — F419 Anxiety disorder, unspecified: Secondary | ICD-10-CM | POA: Diagnosis not present

## 2022-12-20 DIAGNOSIS — E782 Mixed hyperlipidemia: Secondary | ICD-10-CM

## 2022-12-20 DIAGNOSIS — R7401 Elevation of levels of liver transaminase levels: Secondary | ICD-10-CM | POA: Diagnosis not present

## 2022-12-20 DIAGNOSIS — F17210 Nicotine dependence, cigarettes, uncomplicated: Secondary | ICD-10-CM

## 2022-12-20 MED ORDER — NICOTINE 21 MG/24HR TD PT24
21.0000 mg | MEDICATED_PATCH | Freq: Every day | TRANSDERMAL | 1 refills | Status: DC
Start: 2022-12-20 — End: 2023-02-01

## 2022-12-20 MED ORDER — FLUVOXAMINE MALEATE 100 MG PO TABS
100.0000 mg | ORAL_TABLET | Freq: Every day | ORAL | 0 refills | Status: DC
Start: 2022-12-20 — End: 2023-01-19

## 2022-12-20 MED ORDER — ONDANSETRON 4 MG PO TBDP: 4.0000 mg | ORAL_TABLET | Freq: Three times a day (TID) | ORAL | 0 refills | Status: DC | PRN

## 2022-12-20 MED ORDER — OZEMPIC (0.25 OR 0.5 MG/DOSE) 2 MG/3ML ~~LOC~~ SOPN
0.5000 mg | PEN_INJECTOR | SUBCUTANEOUS | 0 refills | Status: AC
Start: 2022-12-20 — End: ?

## 2022-12-20 NOTE — Progress Notes (Unsigned)
Established Patient Office Visit  Subjective   Patient ID: Adriana Hall, female    DOB: [birth year]  Age: 52 y.o. MRN: [id number]  DIABETES MELLITUS: Adriana Hall is a 52 year-old female patient who presents today for medical management of type II diabetes mellitus.  Current diabetes medication regimen: Ozempic 0.25mg  weekly- on Mondays  Started off making her nauseous- notices before she eats. Has eased up the longer she has been on it. Denies diarrhea and constipation.   Patient is adhering to a diabetic diet.  Patient is unable to exercise regularly, due to pulmonary disease.  Patient is checking BS regularly- twice daily. Takes it in the morning, fasting, and then prior to dinner. Avg: usually 160-190, sometimes in 140s Patient is checking their feet regularly. Denies numbness/tingling in lower extremities.  Denies polydipsia, polyphagia, polyuria, open wounds or ulcers on feet.   Lab Results  Component Value Date   HGBA1C 6.5 (H) 09/14/2022    No foot exam found No results found for: "LABMICR", "MICROALBUR"  Wt Readings from Last 3 Encounters:  12/20/22 199 lb (90.3 kg)  11/16/22 200 lb 4.8 oz (90.9 kg)  10/10/22 200 lb 4.8 oz (90.9 kg)    The 10-year ASCVD risk score (Arnett DK, et al., 2019) is: 20.8%   Values used to calculate the score:     Age: 52 years     Sex: Female     Is Non-Hispanic African American: No     Diabetic: Yes     Tobacco smoker: Yes     Systolic Blood Pressure: 138 mmHg     Is BP treated: Yes     HDL Cholesterol: 36 mg/dL     Total Cholesterol: 214 mg/dL   Review of Systems  Constitutional:  Negative for malaise/fatigue.  Eyes:  Negative for blurred vision and double vision.  Respiratory:  Positive for shortness of breath (chronic). Negative for cough.   Cardiovascular:  Negative for chest pain, palpitations and leg swelling.  Gastrointestinal:  Positive for nausea. Negative for abdominal pain, constipation, diarrhea and vomiting.   Musculoskeletal:  Negative for myalgias.  Neurological:  Negative for dizziness and headaches.  Psychiatric/Behavioral:  Negative for depression and suicidal ideas. The patient is not nervous/anxious and does not have insomnia.      Objective:     BP 138/67 Comment: Repeat BP  Pulse 82   Ht 5\' 4"  (1.626 m)   Wt 199 lb (90.3 kg)   SpO2 98%   BMI 34.16 kg/m  BP Readings from Last 3 Encounters:  12/20/22 138/67  11/16/22 137/77  10/10/22 (!) 147/68    Physical Exam Constitutional:      Appearance: Normal appearance.  Cardiovascular:     Rate and Rhythm: Normal rate and regular rhythm.     Pulses: Normal pulses.     Heart sounds: Normal heart sounds.  Pulmonary:     Effort: Pulmonary effort is normal. Prolonged expiration present. No tachypnea, accessory muscle usage or respiratory distress.     Breath sounds: Decreased air movement present. Examination of the right-lower field reveals decreased breath sounds and wheezing. Examination of the left-lower field reveals decreased breath sounds and wheezing. Decreased breath sounds and wheezing present.  Neurological:     Mental Status: She is alert.  Psychiatric:        Mood and Affect: Mood normal.        Behavior: Behavior normal.     Assessment & Plan:    1. Diabetes mellitus without  complication [facility]) Patient presents today for follow-up for medical management of type II diabetes mellitus. Patient reports taking Ozempic as prescribed and tolerating medication well. Notes occasional nausea prior to eating but reports it is not as noticeable. Advised patient to eat after administering injection. POCT hemoglobin A1c today resulting at 6.4. Discussed options, patient agreeable to increase dose of Ozempic to 5mg  weekly. Prescription sent to pharmacy. She reports that she still has a couple of 2.5mg  pens since she received samples. Plan to increase dose slowly. Will also obtain urine microalbumin/creatinine ratio today.  - POCT HgB  A1C - Urine Microalbumin w/creat. ratio - Semaglutide,0.25 or 0.5MG /DOS, (OZEMPIC, 0.25 OR 0.5 MG/DOSE,) 2 MG/3ML SOPN; Inject 0.5 mg into the skin once a week.  Dispense: 3 mL; Refill: 0  2. Anxiety Unable to get patient scheduled with behavioral health since referral was placed. Update provided to patient regarding status and advised she should receive contact in 2 weeks. Provided patient with refills today.  - fluvoxaMINE (LUVOX) 100 MG tablet; Take 1 tablet (100 mg total) by mouth at bedtime.  Dispense: 30 tablet; Refill: 0  3. Transaminitis Plan to recheck liver enzymes, due to recent elevated AST & ALT. If within acceptable range, plan to initiate low-dose statin therapy.  - Hepatic function panel  4. Mixed hyperlipidemia Counseled patient about ASCVD risk, what risk/complications this implies, and how to improve lipid panel with lifestyle modifications. Discussed pharmacotherapy and benefits with this. Patient agreeable to start medication. However, review of recent CMP with noted transaminitis. Plan to recheck hepatic function today. If AST and ALT are within acceptable limits, plan to initiate statin therapy for primary prevention.   5. Continuous dependence on cigarette smoking Counseled patient about importance of attending pulmonology appointment next week. Patient reports she is smoking more than 10 cigarettes daily due to a recent increase in life stressors. However, patient is interested in quitting. Discussed health benefits with smoking cessation. She reports she has quit in the past for a few months. Provided patient with encouragement with smoking cessation. Order sent for step 1 in smoking cessation- 21mg  patch every 24 hours for 6 weeks. After the 6 weeks on step 1, plan to move to step 2 (14mg /daily) for 2-4 weeks, and then step 3 (7mg /day) for 2 weeks.  - nicotine (NICODERM CQ - DOSED IN MG/24 HOURS) 21 mg/24hr patch; Place 1 patch (21 mg total) onto the skin daily.  Dispense:  28 patch; Refill: 1   Return in about 3 months (around 03/22/2023) for Diabetes f/u.    Alyson Reedy, FNP

## 2022-12-21 ENCOUNTER — Ambulatory Visit: Payer: Medicaid Other | Admitting: Orthopedic Surgery

## 2022-12-21 LAB — POCT GLYCOSYLATED HEMOGLOBIN (HGB A1C): Hemoglobin A1C: 6.4 % — AB (ref 4.0–5.6)

## 2022-12-21 LAB — MICROALBUMIN / CREATININE URINE RATIO
Creatinine, Urine: 85 mg/dL
Microalb/Creat Ratio: <4 mg/g creat (ref 0–29)
Microalbumin, Urine: <3 ug/mL

## 2022-12-21 LAB — HEPATIC FUNCTION PANEL
ALT: 29 IU/L (ref 0–32)
AST: 50 IU/L — ABNORMAL HIGH (ref 0–40)
Albumin: 3.8 g/dL (ref 3.8–4.9)
Alkaline Phosphatase: 119 IU/L (ref 44–121)
Bilirubin Total: 0.2 mg/dL (ref 0.0–1.2)
Bilirubin, Direct: 0.13 mg/dL (ref 0.00–0.40)
Total Protein: 6.9 g/dL (ref 6.0–8.5)

## 2022-12-29 ENCOUNTER — Ambulatory Visit: Payer: Medicaid Other | Admitting: Adult Health

## 2023-01-03 ENCOUNTER — Ambulatory Visit (HOSPITAL_BASED_OUTPATIENT_CLINIC_OR_DEPARTMENT_OTHER): Payer: MEDICAID | Admitting: Family Medicine

## 2023-01-11 ENCOUNTER — Ambulatory Visit: Payer: Medicaid Other | Admitting: Orthopedic Surgery

## 2023-01-12 ENCOUNTER — Ambulatory Visit (HOSPITAL_BASED_OUTPATIENT_CLINIC_OR_DEPARTMENT_OTHER): Payer: MEDICAID | Admitting: Family Medicine

## 2023-01-16 ENCOUNTER — Ambulatory Visit (HOSPITAL_BASED_OUTPATIENT_CLINIC_OR_DEPARTMENT_OTHER): Payer: MEDICAID | Admitting: Family Medicine

## 2023-01-19 ENCOUNTER — Ambulatory Visit (INDEPENDENT_AMBULATORY_CARE_PROVIDER_SITE_OTHER): Payer: MEDICAID | Admitting: Family Medicine

## 2023-01-19 ENCOUNTER — Other Ambulatory Visit (HOSPITAL_BASED_OUTPATIENT_CLINIC_OR_DEPARTMENT_OTHER): Payer: Self-pay | Admitting: Family Medicine

## 2023-01-19 ENCOUNTER — Ambulatory Visit (INDEPENDENT_AMBULATORY_CARE_PROVIDER_SITE_OTHER): Payer: MEDICAID

## 2023-01-19 ENCOUNTER — Encounter (HOSPITAL_BASED_OUTPATIENT_CLINIC_OR_DEPARTMENT_OTHER): Payer: Self-pay | Admitting: Family Medicine

## 2023-01-19 VITALS — BP 149/66 | HR 84 | Temp 98.6°F | Ht 64.0 in | Wt 197.4 lb

## 2023-01-19 DIAGNOSIS — F3181 Bipolar II disorder: Secondary | ICD-10-CM

## 2023-01-19 DIAGNOSIS — F419 Anxiety disorder, unspecified: Secondary | ICD-10-CM | POA: Diagnosis not present

## 2023-01-19 DIAGNOSIS — J440 Chronic obstructive pulmonary disease with acute lower respiratory infection: Secondary | ICD-10-CM

## 2023-01-19 MED ORDER — AZITHROMYCIN 250 MG PO TABS
ORAL_TABLET | ORAL | 0 refills | Status: AC
Start: 2023-01-19 — End: 2023-01-24

## 2023-01-19 MED ORDER — ALPRAZOLAM 0.5 MG PO TABS: 0.5000 mg | ORAL_TABLET | ORAL | 0 refills | Status: DC | PRN

## 2023-01-19 MED ORDER — TRELEGY ELLIPTA 100-62.5-25 MCG/ACT IN AEPB
1.0000 | INHALATION_SPRAY | Freq: Every day | RESPIRATORY_TRACT | 11 refills | Status: DC
Start: 2023-01-19 — End: 2023-01-31

## 2023-01-19 MED ORDER — HYDROXYZINE PAMOATE 25 MG PO CAPS
25.0000 mg | ORAL_CAPSULE | Freq: Three times a day (TID) | ORAL | 2 refills | Status: AC | PRN
Start: 2023-01-19 — End: ?

## 2023-01-19 MED ORDER — TRAZODONE HCL 100 MG PO TABS
200.0000 mg | ORAL_TABLET | Freq: Every evening | ORAL | 1 refills | Status: AC | PRN
Start: 2023-01-19 — End: 2024-03-28

## 2023-01-19 MED ORDER — PREDNISONE 20 MG PO TABS
40.0000 mg | ORAL_TABLET | Freq: Every day | ORAL | 0 refills | Status: AC
Start: 2023-01-19 — End: 2023-01-24

## 2023-01-19 MED ORDER — FLUVOXAMINE MALEATE 100 MG PO TABS
100.0000 mg | ORAL_TABLET | Freq: Every day | ORAL | 1 refills | Status: AC
Start: 2023-01-19 — End: ?

## 2023-01-19 MED ORDER — AMOXICILLIN-POT CLAVULANATE 875-125 MG PO TABS
1.0000 | ORAL_TABLET | Freq: Two times a day (BID) | ORAL | 0 refills | Status: DC
Start: 2023-01-19 — End: 2023-07-26

## 2023-01-19 MED ORDER — OLANZAPINE 10 MG PO TABS
10.0000 mg | ORAL_TABLET | Freq: Every day | ORAL | 2 refills | Status: DC
Start: 2023-01-19 — End: 2023-05-01

## 2023-01-19 MED ORDER — GUAIFENESIN ER 600 MG PO TB12
1200.0000 mg | ORAL_TABLET | Freq: Two times a day (BID) | ORAL | 1 refills | Status: AC
Start: 2023-01-19 — End: 2023-03-20

## 2023-01-19 NOTE — Progress Notes (Signed)
Acute Office Visit  Subjective:     Patient ID: Adriana Hall, female    DOB: August 17, 1970, 52 y.o.   MRN: [id number]   Chief Complaint  Patient presents with   possible bronchitis    Pt states she has been coughing a lot and is getting up green phlegm. States this has been going on for about 3 weeks.    Adriana Hall is a year-old female patient who presents today for a cough x3 weeks.. She reports coughing up green phlegm for this period of time. Running a low-grade fever on Tuesday, she reports that she took Tylenol and ibuprofen this morning before appt. Feels more shob with exertion and coughs frequently throughout the night.   She reports that she has to increase her oxygen to 2.5-3L at home with activity & night. She reports her portable tank is usually set between 2-2.5L.   She reports using her inhalers as such: Ventolin- using PRN 1-3x per day  Symbicort- 2 puffs twice a day  Mucinex DM- has been taking OTC daily 600mg  twice daily      Review of Systems  Constitutional:  Positive for fever. Negative for chills, malaise/fatigue and weight loss.  HENT:  Negative for congestion and sinus pain.   Eyes:  Negative for blurred vision and double vision.  Respiratory:  Positive for cough, sputum production, shortness of breath and wheezing. Negative for hemoptysis.   Cardiovascular:  Negative for chest pain, palpitations and leg swelling.  Gastrointestinal:  Negative for abdominal pain, nausea and vomiting.  Musculoskeletal:  Negative for myalgias.  Neurological:  Negative for dizziness, weakness and headaches.  Psychiatric/Behavioral:  Negative for suicidal ideas.     Objective:    BP (!) 149/66 (BP Location: Left Arm, Patient Position: Sitting, Cuff Size: Normal)   Pulse 84   Temp 98.6 F (37 C) (Oral)   Ht 5\' 4"  (1.626 m)   Wt 197 lb 6.4 oz (89.5 kg)   SpO2 98% Comment: 2L pulse  BMI 33.88 kg/m   Physical Exam Constitutional:      Appearance: Normal appearance.   Cardiovascular:     Rate and Rhythm: Normal rate and regular rhythm.     Pulses: Normal pulses.     Heart sounds: Normal heart sounds.  Pulmonary:     Effort: Pulmonary effort is normal. Prolonged expiration present. No tachypnea, bradypnea, accessory muscle usage, respiratory distress or retractions.     Breath sounds: Decreased air movement present. Examination of the right-upper field reveals decreased breath sounds and wheezing. Examination of the left-upper field reveals decreased breath sounds and wheezing. Examination of the right-middle field reveals wheezing. Examination of the left-middle field reveals wheezing. Examination of the right-lower field reveals wheezing. Examination of the left-lower field reveals wheezing. Decreased breath sounds and wheezing present.  Neurological:     Mental Status: She is alert.  Psychiatric:        Mood and Affect: Mood normal.        Behavior: Behavior normal.        Thought Content: Thought content normal.        Judgment: Judgment normal.    Assessment & Plan:   1. Chronic obstructive pulmonary disease with acute lower respiratory infection (HCC) Patient presents today with productive cough for the past three weeks. She reports earlier this week, she had fever/chills. She reports an increase in shortness of breath with exertion/activity. Patient appears well-appearing during exam with normal work of breathing. No acute respiratory distress. Oxygen  saturation 98% on 2L oxygen. Lung sounds are decreased in all fields- with wheezing present. No crackles, rales or rhonchi noted. Plan to obtain STAT CXR due to length of symptoms. Will review results of CXR and may send in both steroids and antibiotics. Plan to repeat CXR in 2 weeks. If symptoms and CXR have not improved, would be reasonable to obtain CT scan of chest. Advised patient to schedule with pulmonology soon. Patient verbalized agreement. Will reach out to patient regarding findings of CXR.  -  DG Chest 2 View - guaiFENesin (MUCINEX) 600 MG 12 hr tablet; Take 2 tablets (1,200 mg total) by mouth 2 (two) times daily.  Dispense: 120 tablet; Refill: 1  2. Anxiety Patient is having a difficult time getting scheduled with a new psychiatrist and needs refill of medications at this time. I advised her that I feel comfortable doing this until she is able to get established with psychiatry for medication management.  - fluvoxaMINE (LUVOX) 100 MG tablet; Take 1 tablet (100 mg total) by mouth at bedtime.  Dispense: 90 tablet; Refill: 1 - hydrOXYzine (VISTARIL) 25 MG capsule; Take 1 capsule (25 mg total) by mouth 3 (three) times daily as needed for anxiety.  Dispense: 30 capsule; Refill: 2 - ALPRAZolam (XANAX) 0.5 MG tablet; Take 1 tablet (0.5 mg total) by mouth as needed.  Dispense: 30 tablet; Refill: 0  3. Bipolar 2 disorder [facility]) Patient is having a difficult time getting scheduled with a new psychiatrist and needs refill of medications at this time. I advised her that I feel comfortable doing this until she is able to get established with psychiatry for medication management.  - OLANZapine (ZYPREXA) 10 MG tablet; Take 1 tablet (10 mg total) by mouth at bedtime.  Dispense: 30 tablet; Refill: 2 - traZODone (DESYREL) 100 MG tablet; Take 2 tablets (200 mg total) by mouth at bedtime as needed for sleep.  Dispense: 60 tablet; Refill: 1  Return in about 6 weeks (around 03/02/2023) for HTN follow-up, Mood f/u.  Alyson Reedy, FNP

## 2023-01-19 NOTE — Progress Notes (Signed)
Please make sure patient saw MyChart message:  It looks like your chest x-ray showed that your COPD is exacerbated and this is most likely due to a bacterial infection. I am sending 4 different things to your pharmacy: prednisone to help with inflammation, Augmentin & azithromycin to treat lung infection, and Trelegy (which will replace your Symbicort). This will hopefully help prevent future flare-ups. I would like to repeat your chest x-ray in 2 weeks. If the chest x-ray is the same and you are still having symptoms, I would like to get a chest CT scan.

## 2023-01-24 ENCOUNTER — Ambulatory Visit: Payer: MEDICAID | Admitting: Adult Health

## 2023-01-26 ENCOUNTER — Telehealth (HOSPITAL_BASED_OUTPATIENT_CLINIC_OR_DEPARTMENT_OTHER): Payer: Self-pay | Admitting: Family Medicine

## 2023-01-26 NOTE — Telephone Encounter (Signed)
Patient stating 2 prescriptions are not being filled at [facility] and [facility] say they have contacting us for issues and patient wants to know why they will not refill them

## 2023-01-27 ENCOUNTER — Ambulatory Visit (HOSPITAL_BASED_OUTPATIENT_CLINIC_OR_DEPARTMENT_OTHER): Payer: Medicaid Other | Admitting: Cardiology

## 2023-01-27 NOTE — Telephone Encounter (Signed)
Spoke with pharmacy, working on [place] for trellegy inhaler

## 2023-01-31 ENCOUNTER — Other Ambulatory Visit (HOSPITAL_BASED_OUTPATIENT_CLINIC_OR_DEPARTMENT_OTHER): Payer: Self-pay

## 2023-01-31 MED ORDER — FLUTICASONE-SALMETEROL 115-21 MCG/ACT IN AERO: 2.0000 | INHALATION_SPRAY | Freq: Two times a day (BID) | RESPIRATORY_TRACT | 12 refills | Status: DC

## 2023-02-01 ENCOUNTER — Ambulatory Visit: Payer: MEDICAID | Admitting: Adult Health

## 2023-02-01 ENCOUNTER — Other Ambulatory Visit (HOSPITAL_BASED_OUTPATIENT_CLINIC_OR_DEPARTMENT_OTHER): Payer: Self-pay

## 2023-02-01 ENCOUNTER — Encounter: Payer: Self-pay | Admitting: Adult Health

## 2023-02-01 VITALS — BP 144/70 | HR 87 | Temp 97.9°F | Ht 64.0 in | Wt 199.8 lb

## 2023-02-01 DIAGNOSIS — J441 Chronic obstructive pulmonary disease with (acute) exacerbation: Secondary | ICD-10-CM | POA: Diagnosis not present

## 2023-02-01 DIAGNOSIS — J189 Pneumonia, unspecified organism: Secondary | ICD-10-CM | POA: Diagnosis not present

## 2023-02-01 DIAGNOSIS — J9611 Chronic respiratory failure with hypoxia: Secondary | ICD-10-CM | POA: Diagnosis not present

## 2023-02-01 DIAGNOSIS — J440 Chronic obstructive pulmonary disease with acute lower respiratory infection: Secondary | ICD-10-CM

## 2023-02-01 DIAGNOSIS — F17210 Nicotine dependence, cigarettes, uncomplicated: Secondary | ICD-10-CM | POA: Diagnosis not present

## 2023-02-01 DIAGNOSIS — Z720 Tobacco use: Secondary | ICD-10-CM

## 2023-02-01 MED ORDER — SPIRIVA RESPIMAT 2.5 MCG/ACT IN AERS: 2.0000 | INHALATION_SPRAY | Freq: Every day | RESPIRATORY_TRACT | Status: DC

## 2023-02-01 MED ORDER — BUDESONIDE-FORMOTEROL FUMARATE 160-4.5 MCG/ACT IN AERO: 2.0000 | INHALATION_SPRAY | Freq: Two times a day (BID) | RESPIRATORY_TRACT | Status: DC

## 2023-02-01 MED ORDER — NICOTINE 21 MG/24HR TD PT24
21.0000 mg | MEDICATED_PATCH | Freq: Every day | TRANSDERMAL | 1 refills | Status: DC
Start: 2023-02-01 — End: 2023-07-26

## 2023-02-01 NOTE — Assessment & Plan Note (Signed)
continue on oxygen to maintain O2 saturations greater than 88 to 90%

## 2023-02-01 NOTE — Assessment & Plan Note (Signed)
Chest x-ray with a right basilar opacity consistent with pneumonia.  Patient is clinically improved after course of antibiotics.  No further antibiotics this time.  Will check serial chest x-ray on follow-up for clearance

## 2023-02-01 NOTE — Assessment & Plan Note (Signed)
Encouraged on smoking cessation.  Advised to set quit date.  NicoDerm patches Rx sent to the pharmacy per request

## 2023-02-01 NOTE — Patient Instructions (Addendum)
Restart Nicoderm patches, set quit date and can step down as discussed.  Continue on Symbicort 2 puffs Twice daily, rinse after use  Continue on Spiriva 2 puffs daily  Albuterol inhaler or Duoneb As needed   Continue on Oxygen 2.5-3 l/m , goal is to keep O2 sats >88-90% Follow up with in 4 weeks Dr. Delton Coombes  in 4 weeks with chest xray and As needed   Please contact office for sooner follow up if symptoms do not improve or worsen or seek emergency care

## 2023-02-01 NOTE — Assessment & Plan Note (Signed)
Recent COPD exacerbation-resolving.  Encourage patient on smoking cessation.  Hold on additional steroids as she does not tolerate prednisone well.  Plan  Patient Instructions  Restart Nicoderm patches, set quit date and can step down as discussed.  Continue on Symbicort 2 puffs Twice daily, rinse after use  Continue on Spiriva 2 puffs daily  Albuterol inhaler or Duoneb As needed   Continue on Oxygen 2.5-3 l/m , goal is to keep O2 sats >88-90% Follow up with in 4 weeks Dr. Delton Coombes  in 4 weeks with chest xray and As needed   Please contact office for sooner follow up if symptoms do not improve or worsen or seek emergency care

## 2023-02-01 NOTE — Progress Notes (Signed)
@Patient  ID: Adriana Hall, female    DOB: Jun 22, 1970, 52 y.o.   MRN: [id number]  Chief Complaint  Patient presents with   Follow-up    Referring provider: Alyson Reedy, FNP  HPI: 52 year old female smoker followed for COPD, abnormal CT chest with scattered groundglass pulmonary infiltrates suspicious for RB-ILD History of severe right-sided pneumonia with associated parapneumonic effusion  TEST/EVENTS :  High-resolution CT chest 04/11/2022  shows no pathologically enlarged lymph nodes, mediastinal nodes up to 9 mm, paraseptal emphysema with parabronchial vascular subpleural groundglass infiltrate, base predominant, little change compared with 05/2021, principally an NSIP pattern   PFTs June 24, 2021 showed moderate restriction with an FEV1 at 73%, ratio 88, FVC 66%, no significant bronchodilator response, DLCO 67%  02/01/2023 Follow up : COPD , RB-ILD, Pneumonia  Patient presents for a follow-up visit.  Last seen May 25, 2022. She is followed for COPD and RB-ILD . Recent developed increased cough and congestion. Seen by PCP on 01/19/23 treated for COPD flare and Pneumonia with Levaquin and steroids.  Patient says she is feeling better.  Cough and congestion have decreased.  She continues to be short of breath with activities.  Remains on oxygen at 2-1/2 to 3 L.  She has restarted smoking.  Says she wants to try to quit again and wants a new prescription for NicoDerm patches as how she quit in the past.  She denies any hemoptysis, orthopnea, edema, appetite is good with no nausea vomiting or diarrhea.  Chest x-ray on August 15 showed bilateral interstitial opacities.  Increased focal opacity in the right lung base possibly superimposed infection. We discussed smoking cessation  Remains on Symbicort and Spiriva.  Previously been changed to Trelegy but insurance would not cover..   Allergies  Allergen Reactions   Biotin     'Makes me extremely sick'   Doxycycline Nausea And  Vomiting    Immunization History  Administered Date(s) Administered   Influenza-Unspecified 05/15/2021   Pneumococcal Polysaccharide-23 10/22/2013, 12/28/2019    Past Medical History:  Diagnosis Date   Abnormal CT of the chest 06/28/2021   Anxiety    10 years   Arrhythmia    5 years   Bipolar 1 disorder, depressed (HCC)    Bronchitis    Colitis 03/01/2011   Colonoscopy   Depression    10 years   Diabetes mellitus without complication (HCC) 10/10/2022   Headache(784.0)    Chronic for 30 yrs   Hemorrhoids 03/01/2011   Colonoscopy    Hypertension    HYPERTENSION, BENIGN 07/30/2009   Qualifier: Diagnosis of   By: Jolene Provost      HYPERTRIGLYCERIDEMIA 07/30/2009   Qualifier: Diagnosis of   By: Jolene Provost      Mass of right lung 11/21/2020   Post traumatic stress disorder (PTSD) 09/07/2010   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 ARJ 05/25/22   Ready to quit: No Counseling given: Yes Tobacco comments: Pt stated she quit 4 days ago. ARJ 05/25/22   Outpatient  Medications Prior to Visit  Medication Sig Dispense Refill   acetaminophen (TYLENOL) 325 MG tablet Take 2 tablets (650 mg total) by mouth every 6 (six) hours as needed for mild pain (or Fever >/= 101). 30 tablet 0   ALPRAZolam (XANAX) 0.5 MG tablet Take 1 tablet (0.5 mg total) by mouth as needed. 30 tablet 0   Blood Glucose Monitoring Suppl DEVI 1 each by Does not apply route in the morning, at noon, and at bedtime. May substitute to any manufacturer covered  by patient's insurance. 1 each 0   diphenhydrAMINE (BENADRYL) 25 MG tablet Take 50 mg by mouth at bedtime.     fluconazole (DIFLUCAN) 150 MG tablet Take 1 tablet (150 mg total) by mouth once a week. 4 tablet 0   fluticasone (FLONASE) 50 MCG/ACT nasal spray Place 1 spray into both nostrils daily. 18.2 mL 2   fluvoxaMINE (LUVOX) 100 MG tablet Take 1 tablet (100 mg total) by mouth at bedtime. 90 tablet 1   gabapentin (NEURONTIN) 400 MG capsule Take 1 capsule (400 mg total) by mouth 3 (three) times daily. 90 capsule 0   guaiFENesin (MUCINEX) 600 MG 12 hr tablet Take 2 tablets (1,200 mg total) by mouth 2 (two) times daily. 120 tablet 1   hydrOXYzine (VISTARIL) 25 MG capsule Take 1 capsule (25 mg total) by mouth 3 (three) times daily as needed for anxiety. 30 capsule 2   ibuprofen (ADVIL) 200 MG tablet Take 400 mg by mouth every 6 (six) hours as needed for fever or moderate pain.     ipratropium-albuterol (DUONEB) 0.5-2.5 (3) MG/3ML SOLN TAKE 3 MLS BY NEBULIZATION EVERY 6 (SIX) HOURS AS NEEDED. 360 mL 3   nitroGLYCERIN (NITROLINGUAL) 0.4 MG/SPRAY spray Place 1 spray under the tongue once as needed for chest pain. 12 g 0   Nystatin POWD Apply 1 each topically 3 (three) times daily as needed. Apply liberally to affected area 3 times per day as needed. 1 each 2   OLANZapine (ZYPREXA) 10 MG tablet Take 1 tablet (10 mg total) by mouth at bedtime. 30 tablet 2   omeprazole (PRILOSEC) 40 MG capsule Take 40 mg by mouth daily.     ondansetron (ZOFRAN-ODT) 4 MG disintegrating tablet Take 1 tablet (4 mg total) by mouth every 8 (eight) hours as needed for nausea or vomiting. 20 tablet 0   Semaglutide,0.25 or 0.5MG /DOS, (OZEMPIC, 0.25 OR 0.5 MG/DOSE,) 2 MG/3ML SOPN Inject 0.5 mg into the skin once a week. 3 mL 0   Skin Protectants, Misc. (INTERDRY 40"J811") SHEE Apply 1 each topically in the morning, at noon, and at bedtime. 1 each 3   Spacer/Aero-Holding Chambers (AEROCHAMBER MV) inhaler Use as instructed 1 each 2    traZODone (DESYREL) 100 MG tablet Take 2 tablets (200 mg total) by mouth at bedtime as needed for sleep. 60 tablet 1   VENTOLIN HFA 108 (90 Base) MCG/ACT inhaler TAKE 2 PUFFS BY MOUTH EVERY 6 HOURS AS NEEDED FOR WHEEZE OR SHORTNESS OF BREATH 18 each 2   fluticasone-salmeterol (ADVAIR HFA) 115-21 MCG/ACT inhaler Inhale 2 puffs into the lungs 2 (two) times daily. 1 each 12   nicotine (NICODERM CQ - DOSED IN MG/24 HOURS) 21 mg/24hr patch Place 1 patch (21 mg total) onto the skin daily. 28 patch 1   amoxicillin-clavulanate (AUGMENTIN) 875-125 MG tablet Take 1 tablet by mouth 2 (two) times daily. (Patient not taking: Reported on 02/01/2023) 14 tablet 0   No facility-administered medications prior to visit.  Review of Systems:   Constitutional:   No  weight loss, night sweats,  Fevers, chills, +fatigue, or  lassitude.  HEENT:   No headaches,  Difficulty swallowing,  Tooth/dental problems, or  Sore throat,                No sneezing, itching, ear ache, nasal congestion, post nasal drip,   CV:  No chest pain,  Orthopnea, PND, swelling in lower extremities, anasarca, dizziness, palpitations, syncope.   GI  No heartburn, indigestion, abdominal pain, nausea, vomiting, diarrhea, change in bowel habits, loss of appetite, bloody stools.   Resp:   No chest wall deformity  Skin: no rash or lesions.  GU: no dysuria, change in color of urine, no urgency or frequency.  No flank pain, no hematuria   MS:  No joint pain or swelling.  No decreased range of motion.  No back pain.    Physical Exam  BP (!) 144/70 (BP Location: Left Arm, Cuff Size: Large)   Pulse 87   Temp 97.9 F (36.6 C) (Temporal)   Ht 5\' 4"  (1.626 m)   Wt 199 lb 12.8 oz (90.6 kg)   SpO2 98%   BMI 34.30 kg/m   GEN: A/Ox3; pleasant , NAD, well nourished  O2    HEENT:  [place]/AT,  NOSE-clear, THROAT-clear, no lesions, no postnasal drip or exudate noted.   NECK:  Supple w/ fair ROM; no JVD; normal carotid impulses w/o bruits; no  thyromegaly or nodules palpated; no lymphadenopathy.    RESP Few scattered rhonchi no accessory muscle use, no dullness to percussion  CARD:  RRR, no m/r/g, tr  peripheral edema, pulses intact, no cyanosis or clubbing.  GI:   Soft & nt; nml bowel sounds; no organomegaly or masses detected.   Musco: Warm bil, no deformities or joint swelling noted.   Neuro: alert, no focal deficits noted.    Skin: Warm, no lesions or rashes    Lab Results:  CBC   BMET     Imaging: DG Chest 2 View  Result Date: 01/19/2023 CLINICAL DATA:  chronic O2 use with cough x3 weeks EXAM: CHEST - 2 VIEW COMPARISON:  CXR 05/25/22 FINDINGS: No pleural effusion. No pneumothorax. Unchanged cardiac and mediastinal contours. There are prominent bilateral interstitial opacities could represent an underlying chronic lung disease. There is more focal hazy opacity at the right lung base, which could represent superimposed infection. No radiographically apparent displaced rib fractures. Visualized upper abdomen is unremarkable. Vertebral body heights are maintained. IMPRESSION: Prominent bilateral interstitial opacities could represent an underlying chronic lung disease. There is more focal hazy opacity at the right lung base, which could represent superimposed infection. Electronically Signed   By: Lorenza Cambridge M.D.   On: 01/19/2023 17:01    Administration History     None          Latest Ref Rng & Units 06/24/2021    3:50 PM  PFT Results  FVC-Pre L 2.21   FVC-Predicted Pre % 64   FVC-Post L 2.28   FVC-Predicted Post % 66   Pre FEV1/FVC % % 89   Post FEV1/FCV % % 88   FEV1-Pre L 1.96   FEV1-Predicted Pre % 72   FEV1-Post L 2.00   DLCO uncorrected ml/min/mmHg 13.79   DLCO UNC% % 67   DLCO corrected ml/min/mmHg 13.79   DLCO COR %Predicted % 67   DLVA Predicted % 80   TLC L 3.95   TLC % Predicted % 79  RV % Predicted % 97     No results found for: "NITRICOXIDE"      Assessment & Plan:    COPD (chronic obstructive pulmonary disease) (HCC) Recent COPD exacerbation-resolving.  Encourage patient on smoking cessation.  Hold on additional steroids as she does not tolerate prednisone well.  Plan  Patient Instructions  Restart Nicoderm patches, set quit date and can step down as discussed.  Continue on Symbicort 2 puffs Twice daily, rinse after use  Continue on Spiriva 2 puffs daily  Albuterol inhaler or Duoneb As needed   Continue on Oxygen 2.5-3 l/m , goal is to keep O2 sats >88-90% Follow up with in 4 weeks Dr. Delton Coombes  in 4 weeks with chest xray and As needed   Please contact office for sooner follow up if symptoms do not improve or worsen or seek emergency care    Chronic respiratory failure with hypoxia (HCC)  continue on oxygen to maintain O2 saturations greater than 88 to 90%  Tobacco abuse Encouraged on smoking cessation.  Advised to set quit date.  NicoDerm patches Rx sent to the pharmacy per request  CAP (community acquired pneumonia) Chest x-ray with a right basilar opacity consistent with pneumonia.  Patient is clinically improved after course of antibiotics.  No further antibiotics this time.  Will check serial chest x-ray on follow-up for clearance     Rubye Oaks, NP 02/01/2023

## 2023-02-02 ENCOUNTER — Ambulatory Visit: Payer: MEDICAID | Admitting: Orthopedic Surgery

## 2023-02-03 ENCOUNTER — Ambulatory Visit (HOSPITAL_BASED_OUTPATIENT_CLINIC_OR_DEPARTMENT_OTHER): Payer: MEDICAID | Admitting: Cardiology

## 2023-02-09 ENCOUNTER — Telehealth (HOSPITAL_BASED_OUTPATIENT_CLINIC_OR_DEPARTMENT_OTHER): Payer: Self-pay | Admitting: Family Medicine

## 2023-02-09 NOTE — Telephone Encounter (Signed)
Patient calling in regarding her new inhaler. She is asking does she use it along with her other inhalers or stop the other inhalers. She also states the pharmacy will not fill her xanex due to [place] not putting precise directions on the prescription. Please advise patient.

## 2023-02-10 NOTE — Telephone Encounter (Signed)
LVM for patient to return call.  Please advise xanax prescription

## 2023-02-13 ENCOUNTER — Other Ambulatory Visit (HOSPITAL_BASED_OUTPATIENT_CLINIC_OR_DEPARTMENT_OTHER): Payer: Self-pay | Admitting: Family Medicine

## 2023-02-13 DIAGNOSIS — F419 Anxiety disorder, unspecified: Secondary | ICD-10-CM

## 2023-02-13 MED ORDER — ALPRAZOLAM 0.5 MG PO TABS: 0.2500 mg | ORAL_TABLET | Freq: Every day | ORAL | 0 refills | Status: AC | PRN

## 2023-02-13 NOTE — Progress Notes (Signed)
PDMP reviewed. No red flags present. Refill sent.

## 2023-02-20 ENCOUNTER — Telehealth (HOSPITAL_BASED_OUTPATIENT_CLINIC_OR_DEPARTMENT_OTHER): Payer: Self-pay | Admitting: Family Medicine

## 2023-02-20 ENCOUNTER — Other Ambulatory Visit (HOSPITAL_BASED_OUTPATIENT_CLINIC_OR_DEPARTMENT_OTHER): Payer: Self-pay

## 2023-02-20 MED ORDER — ONDANSETRON 4 MG PO TBDP: 4.0000 mg | ORAL_TABLET | Freq: Three times a day (TID) | ORAL | 1 refills | Status: DC | PRN

## 2023-02-20 NOTE — Telephone Encounter (Signed)
Spoke with patient, sent in zofran refill

## 2023-02-20 NOTE — Telephone Encounter (Signed)
She has had a stomach bug. She is having nausea and cannot even keep meds down. Can you call in something for nausea. Please advise Adriana Hall

## 2023-02-27 ENCOUNTER — Telehealth (HOSPITAL_BASED_OUTPATIENT_CLINIC_OR_DEPARTMENT_OTHER): Payer: Self-pay | Admitting: Family Medicine

## 2023-02-27 NOTE — Telephone Encounter (Signed)
Patient following up on prescription at [facility] to see if you did the PA for it. Please advise patient.

## 2023-02-28 NOTE — Telephone Encounter (Signed)
Spoke with patient, she is requesting the next dose up of ozempic or a refill of the current dose whatever is preferred

## 2023-03-01 ENCOUNTER — Ambulatory Visit: Payer: MEDICAID | Admitting: Orthopedic Surgery

## 2023-03-02 ENCOUNTER — Ambulatory Visit (HOSPITAL_BASED_OUTPATIENT_CLINIC_OR_DEPARTMENT_OTHER): Payer: MEDICAID | Admitting: Family Medicine

## 2023-03-04 ENCOUNTER — Other Ambulatory Visit (HOSPITAL_BASED_OUTPATIENT_CLINIC_OR_DEPARTMENT_OTHER): Payer: Self-pay | Admitting: Family Medicine

## 2023-03-04 DIAGNOSIS — E119 Type 2 diabetes mellitus without complications: Secondary | ICD-10-CM

## 2023-03-04 MED ORDER — OZEMPIC (0.25 OR 0.5 MG/DOSE) 2 MG/3ML ~~LOC~~ SOPN
0.5000 mg | PEN_INJECTOR | SUBCUTANEOUS | 0 refills | Status: DC
Start: 2023-03-04 — End: 2023-03-14

## 2023-03-06 ENCOUNTER — Ambulatory Visit (HOSPITAL_BASED_OUTPATIENT_CLINIC_OR_DEPARTMENT_OTHER): Payer: MEDICAID | Admitting: Family Medicine

## 2023-03-07 ENCOUNTER — Telehealth (HOSPITAL_BASED_OUTPATIENT_CLINIC_OR_DEPARTMENT_OTHER): Payer: Self-pay | Admitting: *Deleted

## 2023-03-07 NOTE — Telephone Encounter (Signed)
LVM to reschedule cancelled appt from 9/30

## 2023-03-08 ENCOUNTER — Ambulatory Visit: Payer: MEDICAID | Admitting: Emergency Medicine

## 2023-03-10 ENCOUNTER — Encounter: Payer: Self-pay | Admitting: Emergency Medicine

## 2023-03-14 ENCOUNTER — Ambulatory Visit (HOSPITAL_BASED_OUTPATIENT_CLINIC_OR_DEPARTMENT_OTHER): Payer: MEDICAID | Admitting: Family Medicine

## 2023-03-14 ENCOUNTER — Encounter (HOSPITAL_BASED_OUTPATIENT_CLINIC_OR_DEPARTMENT_OTHER): Payer: Self-pay | Admitting: Family Medicine

## 2023-03-14 VITALS — BP 142/62 | HR 90 | Ht 64.0 in | Wt 195.0 lb

## 2023-03-14 DIAGNOSIS — H109 Unspecified conjunctivitis: Secondary | ICD-10-CM | POA: Insufficient documentation

## 2023-03-14 DIAGNOSIS — B372 Candidiasis of skin and nail: Secondary | ICD-10-CM | POA: Diagnosis not present

## 2023-03-14 DIAGNOSIS — J440 Chronic obstructive pulmonary disease with acute lower respiratory infection: Secondary | ICD-10-CM

## 2023-03-14 DIAGNOSIS — R030 Elevated blood-pressure reading, without diagnosis of hypertension: Secondary | ICD-10-CM | POA: Diagnosis not present

## 2023-03-14 DIAGNOSIS — E119 Type 2 diabetes mellitus without complications: Secondary | ICD-10-CM | POA: Diagnosis not present

## 2023-03-14 DIAGNOSIS — J209 Acute bronchitis, unspecified: Secondary | ICD-10-CM | POA: Insufficient documentation

## 2023-03-14 MED ORDER — FLUCONAZOLE 150 MG PO TABS
150.0000 mg | ORAL_TABLET | ORAL | 0 refills | Status: DC
Start: 2023-03-14 — End: 2023-07-26

## 2023-03-14 MED ORDER — CIPROFLOXACIN HCL 0.3 % OP OINT: TOPICAL_OINTMENT | Freq: Two times a day (BID) | OPHTHALMIC | 0 refills | Status: AC

## 2023-03-14 MED ORDER — NYSTATIN POWD
1.0000 | Freq: Three times a day (TID) | 2 refills | Status: AC | PRN
Start: 2023-03-14 — End: ?

## 2023-03-14 MED ORDER — OZEMPIC (0.25 OR 0.5 MG/DOSE) 2 MG/3ML ~~LOC~~ SOPN
0.5000 mg | PEN_INJECTOR | SUBCUTANEOUS | 2 refills | Status: DC
Start: 2023-03-14 — End: 2023-06-21

## 2023-03-14 MED ORDER — BREZTRI AEROSPHERE 160-9-4.8 MCG/ACT IN AERO
2.0000 | INHALATION_SPRAY | Freq: Two times a day (BID) | RESPIRATORY_TRACT | 11 refills | Status: DC
Start: 2023-03-14 — End: 2023-07-19

## 2023-03-14 MED ORDER — CLOTRIMAZOLE 1 % EX LOTN
1.0000 | TOPICAL_LOTION | Freq: Two times a day (BID) | CUTANEOUS | 2 refills | Status: AC
Start: 2023-03-14 — End: ?

## 2023-03-14 MED ORDER — AZITHROMYCIN 250 MG PO TABS
ORAL_TABLET | ORAL | 0 refills | Status: AC
Start: 2023-03-14 — End: 2023-03-19

## 2023-03-14 NOTE — Assessment & Plan Note (Signed)
Hemoglobin A1c completed 12/21/2022 with results of 6.4. Patient has been doing well on 0.25mg  Ozempic weekly and is interested in increasing her dose to 0.5mg  weekly to assist with lifestyle modifications. If patient is able to increase her dose, plan to follow up in 6-8 weeks.

## 2023-03-14 NOTE — Assessment & Plan Note (Addendum)
Patient presents today with slightly elevated blood pressure, recheck still elevated but improved. Patient in no acute distress and is well-appearing. Denies chest pain, lower extremity edema, vision changes, headaches, changes in bladder habits. Cardiovascular exam with heart regular rate and rhythm. Normal heart sounds, no murmurs present. No lower extremity edema present. Lungs clear to auscultation bilaterally. Patient is not currently taking pharmacotherapy for hypertension. Will continue to monitor. Normal recent urine microalbumin/creatinine ratio.

## 2023-03-14 NOTE — Assessment & Plan Note (Addendum)
Patient presents with dyspnea on exertion, increased cough, and sputum production. Reports using her albuterol inhaler more frequently with minimal relief. Denies fever/chills, myalgia, chest pain. Patient is a current smoker, currently on home oxygen at 2 L/min and has required an increase to 3L/min since her last office visit on 02/01/2023. We discussed use of nicotine patches at her previous office visit, she reports she has been using them but still struggles with tobacco use. Discussed importance of smoking cessation. Physical exam notable for diminished breath sounds bilaterally, scattered expiratory and inspiratory wheezes anteriorly and posteriorly, no crackles present. Offered DuoNeb in office but patient reports she usually takes her nebulizer at home in the afternoon. Heart regular rate and rhythm, no murmurs. COPD exacerbation, moderate severity, likely triggered by respiratory infection. Patient does not tolerate oral prednisone. Advised her to increase albuterol/ipratropium inhaler to every 4 hours. Will change Spiriva & Symbicort daily maintenance inhalers to [employer]. Provided patient with samples of Breztri. Prescribed patient azithromycin 500mg  day 1, then 250mg  daily for 4 more days. Advised her to monitor her oxygen saturation and maintain it >88%. Advised patient to return to office in 3-5 days if symptoms persist or worsen. If she does not improve, may require hospitalization.

## 2023-03-14 NOTE — Assessment & Plan Note (Signed)
Patient presents today for concerns of redness, irritation, and thick yellow discharge from both eyes present for a little more than two weeks. No vision changes or significant pain, but reports crusting of both eyelids in the morning with thick yellow discharge. Patient may be experiencing an acute viral URI causing her COPD exacerbation. Pupils equal, round, reactive to light and accomodation. Diffuse and mild injection in both right and left eyes. No discharge present on exam. Prescribed ciprofloxacin ointment to be applied to both eyes twice daily for 5 days. Educated on hand hygiene and avoiding contact with the eyes. Advised warm compresses for comfort. Follow-up by end of the week if no improvement.

## 2023-03-14 NOTE — Assessment & Plan Note (Signed)
Patient reports she is developing another rash under her breasts and in her groin region.  Faint erythematous patches in areas where skin rubs together under both breasts and in her groin region that is itchy and has a burning sensation. Educated patient about maintaining good hygiene, keeping skin folds dry, and avoiding tight-fitting clothing. Will refill nystatin powder and order antifungal cream. Advised patient to take oral diflucan if antifungal powder and cream do not help improve rash.

## 2023-03-14 NOTE — Progress Notes (Unsigned)
Established Patient Office Visit  Subjective   Patient ID: Adriana Hall, female    DOB: Mar 05, 1971  Age: 52 y.o. MRN: [id number]  Adriana Hall is a 52 year old female patient who presents today for various concerns, including eye discharge, skin rash, and cough. Patient reports experiencing eye discharge for the past 3 weeks that is yellow in color. She wakes up with crusting on both of her eyes. She tried OTC drops with some relief of redness/itching but discharge is still present. Patient reports her rash is under her belly and breasts- needs more powder.   Patient also presents for cough and wheezing. She reports she never improved in breathing from her last office visit with myself. Patient has not been using nicotine patch regularly as prescribed. Her oxygen is now at 3L at rest. She normally takes her DuoNeb in the afternoon and evening.   History of HTN in the past- atenolol in the past  Hemoglobin A1c done 7/17 with results of 6.4 Normal urine microalbumin/creatinine ratio   She has a new patient appt scheduled with psychiatry in [facility] next week   Review of Systems  Constitutional:  Negative for chills, fever and malaise/fatigue.  Respiratory:  Positive for cough, sputum production, shortness of breath and wheezing. Negative for hemoptysis.   Cardiovascular:  Negative for chest pain, palpitations and leg swelling.  Gastrointestinal:  Negative for abdominal pain, nausea and vomiting.  Musculoskeletal:  Negative for myalgias.  Neurological:  Negative for dizziness, weakness and headaches.  Psychiatric/Behavioral:  Negative for depression and suicidal ideas. The patient is not nervous/anxious.     Objective:     BP (!) 142/62   Pulse 90   Ht 5\' 4"  (1.626 m)   Wt 195 lb (88.5 kg)   SpO2 99%   BMI 33.47 kg/m  BP Readings from Last 3 Encounters:  03/14/23 (!) 142/62  02/01/23 (!) 144/70  01/19/23 (!) 149/66     Physical Exam Constitutional:      Appearance:  Normal appearance.  Eyes:     General:        Right eye: No discharge.        Left eye: No discharge.     Extraocular Movements: Extraocular movements intact.     Conjunctiva/sclera:     Right eye: Right conjunctiva is injected. No exudate.    Left eye: Left conjunctiva is injected. No exudate. Cardiovascular:     Rate and Rhythm: Normal rate and regular rhythm.     Pulses: Normal pulses.     Heart sounds: Normal heart sounds.  Pulmonary:     Effort: Pulmonary effort is normal. Prolonged expiration present. No respiratory distress.     Breath sounds: Decreased air movement present. Examination of the right-upper field reveals wheezing. Examination of the left-upper field reveals wheezing. Examination of the right-middle field reveals wheezing. Examination of the left-middle field reveals wheezing. Examination of the right-lower field reveals wheezing. Examination of the left-lower field reveals wheezing. Decreased breath sounds and wheezing (anteriorly & posteriorly in all lung fields) present.     Comments: Patient wearing 3L supplemental oxygen  Skin:    Findings: Rash (Under bilateral breasts and on her groin regions) present. Rash is papular.  Neurological:     Mental Status: She is alert.  Psychiatric:        Mood and Affect: Mood normal.        Behavior: Behavior normal.     Assessment & Plan:   Acute bronchitis with COPD (  HCC) Assessment & Plan: Patient presents with dyspnea on exertion, increased cough, and sputum production. Reports using her albuterol inhaler more frequently with minimal relief. Denies fever/chills, myalgia, chest pain. Patient is a current smoker, currently on home oxygen at 2 L/min and has required an increase to 3L/min since her last office visit on 02/01/2023. We discussed use of nicotine patches at her previous office visit, she reports she has been using them but still struggles with tobacco use. Discussed importance of smoking cessation. Physical exam  notable for diminished breath sounds bilaterally, scattered expiratory and inspiratory wheezes anteriorly and posteriorly, no crackles present. Offered DuoNeb in office but patient reports she usually takes her nebulizer at home in the afternoon. Heart regular rate and rhythm, no murmurs. COPD exacerbation, moderate severity, likely triggered by respiratory infection. Patient does not tolerate oral prednisone. Advised her to increase albuterol/ipratropium inhaler to every 4 hours. Will change Spiriva & Symbicort daily maintenance inhalers to [employer]. Provided patient with samples of Breztri. Prescribed patient azithromycin 500mg  day 1, then 250mg  daily for 4 more days. Advised her to monitor her oxygen saturation and maintain it >88%. Advised patient to return to office in 3-5 days if symptoms persist or worsen. If she does not improve, may require hospitalization.   Orders: -     Azithromycin; Take 2 tablets on day 1, then 1 tablet daily on days 2 through 5  Dispense: 6 tablet; Refill: 0 -     Breztri Aerosphere; Inhale 2 puffs into the lungs 2 (two) times daily.  Dispense: 10.7 g; Refill: 11  Diabetes mellitus without complication (HCC) Assessment & Plan: Hemoglobin A1c completed 12/21/2022 with results of 6.4. Patient has been doing well on 0.25mg  Ozempic weekly and is interested in increasing her dose to 0.5mg  weekly to assist with lifestyle modifications. If patient is able to increase her dose, plan to follow up in 6-8 weeks.   Orders: -     Ozempic (0.25 or 0.5 MG/DOSE); Inject 0.5 mg into the skin once a week.  Dispense: 3 mL; Refill: 2  Elevated blood pressure reading in office without diagnosis of hypertension Assessment & Plan: Patient presents today with slightly elevated blood pressure, recheck still elevated but improved. Patient in no acute distress and is well-appearing. Denies chest pain, lower extremity edema, vision changes, headaches, changes in bladder habits. Cardiovascular exam  with heart regular rate and rhythm. Normal heart sounds, no murmurs present. No lower extremity edema present. Lungs clear to auscultation bilaterally. Patient is not currently taking pharmacotherapy for hypertension. Will continue to monitor. Normal recent urine microalbumin/creatinine ratio.     Candidal intertrigo Assessment & Plan: Patient reports she is developing another rash under her breasts and in her groin region.  Faint erythematous patches in areas where skin rubs together under both breasts and in her groin region that is itchy and has a burning sensation. Educated patient about maintaining good hygiene, keeping skin folds dry, and avoiding tight-fitting clothing. Will refill nystatin powder and order antifungal cream. Advised patient to take oral diflucan if antifungal powder and cream do not help improve rash.   Orders: -     Fluconazole; Take 1 tablet (150 mg total) by mouth once a week.  Dispense: 4 tablet; Refill: 0 -     Nystatin; Apply 1 each topically 3 (three) times daily as needed. Apply liberally to affected area 3 times per day as needed.  Dispense: 1 each; Refill: 2 -     Clotrimazole; Apply 1 Application topically  in the morning and at bedtime. Apply to affected areas (under breasts and in the external groin region) twice daily for 2 weeks.  Dispense: 30 mL; Refill: 2  Bacterial conjunctivitis Assessment & Plan: Patient presents today for concerns of redness, irritation, and thick yellow discharge from both eyes present for a little more than two weeks. No vision changes or significant pain, but reports crusting of both eyelids in the morning with thick yellow discharge. Patient may be experiencing an acute viral URI causing her COPD exacerbation. Pupils equal, round, reactive to light and accomodation. Diffuse and mild injection in both right and left eyes. No discharge present on exam. Prescribed ciprofloxacin ointment to be applied to both eyes twice daily for 5 days.  Educated on hand hygiene and avoiding contact with the eyes. Advised warm compresses for comfort. Follow-up by end of the week if no improvement.   Orders: -     Ciprofloxacin HCl; Place into both eyes 2 (two) times daily for 5 days.  Dispense: 3.5 g; Refill: 0     Return in about 3 months (around 06/14/2023) for chronic conditions.   Counseled patient about importance of maintaining pulmonology appointments, due to being on supplemental home oxygen and recent COPD exacerbations. Patient reports having transportation issues, getting to Dr. Kavin Leech office, and reports she would be more easily to attend appointments at the [facility] location. Counseled patient to call Dr. Kavin Leech office to get approval to change offices. Will follow-up with patient on Friday to ensure she has an upcoming appointment.    Alyson Reedy, FNP

## 2023-03-14 NOTE — Patient Instructions (Addendum)
*  Please call your pulmonologist and inform them you would like to switch to the [facility] location.

## 2023-03-16 ENCOUNTER — Telehealth (HOSPITAL_BASED_OUTPATIENT_CLINIC_OR_DEPARTMENT_OTHER): Payer: Self-pay

## 2023-03-16 NOTE — Telephone Encounter (Signed)
Called and spoke with patient to check on her breathing. She states "I feel like crap". I asked if she had anyway to check her oxygen levels at home she does not. As advised by the provider, I let patient know if her breathing worsens or becomes more difficult to report to the ED. Provider offered patient to come and have a repeat chest xray tomorrow, patient states she has to discuss with her husband as he is her transportation. Asked patient if she has reached out to her pulmonologist due to her missing her last appointment and needing an urgent f/u, she states she called and did not receive an answer she will call back.

## 2023-03-29 ENCOUNTER — Ambulatory Visit: Payer: MEDICAID

## 2023-03-29 ENCOUNTER — Encounter: Payer: Self-pay | Admitting: Emergency Medicine

## 2023-03-29 ENCOUNTER — Ambulatory Visit (INDEPENDENT_AMBULATORY_CARE_PROVIDER_SITE_OTHER): Payer: MEDICAID | Admitting: Emergency Medicine

## 2023-03-29 VITALS — BP 151/76 | HR 93 | Temp 98.2°F | Ht 64.0 in | Wt 196.2 lb

## 2023-03-29 DIAGNOSIS — J209 Acute bronchitis, unspecified: Secondary | ICD-10-CM

## 2023-03-29 DIAGNOSIS — J84115 Respiratory bronchiolitis interstitial lung disease: Secondary | ICD-10-CM | POA: Diagnosis not present

## 2023-03-29 DIAGNOSIS — J441 Chronic obstructive pulmonary disease with (acute) exacerbation: Secondary | ICD-10-CM | POA: Diagnosis not present

## 2023-03-29 DIAGNOSIS — J440 Chronic obstructive pulmonary disease with acute lower respiratory infection: Secondary | ICD-10-CM

## 2023-03-29 DIAGNOSIS — Z720 Tobacco use: Secondary | ICD-10-CM

## 2023-03-29 DIAGNOSIS — J219 Acute bronchiolitis, unspecified: Secondary | ICD-10-CM

## 2023-03-29 MED ORDER — DOXYCYCLINE HYCLATE 100 MG PO TABS: 100.0000 mg | ORAL_TABLET | Freq: Two times a day (BID) | ORAL | 0 refills | Status: DC

## 2023-03-29 NOTE — Assessment & Plan Note (Signed)
Continues to smoke.  Did discuss importance of cessation with her today

## 2023-03-29 NOTE — Assessment & Plan Note (Signed)
She prefers the [employer].  We will have to ensure that she can obtain it with her insurance.   Agree with continuing Breztri 2 puffs twice daily.  Rinse and gargle after you use it. Keep your albuterol available to use 2 puffs when needed for shortness of breath, chest tightness, wheezing. Follow Dr. Delton Coombes in about 6 weeks so we can review your CT scan of the chest

## 2023-03-29 NOTE — Patient Instructions (Signed)
Please take doxycycline 100 mg twice a day until completely gone. Chest x-ray today Agree with continuing Breztri 2 puffs twice daily.  Rinse and gargle after you use it. Keep your albuterol available to use 2 puffs when needed for shortness of breath, chest tightness, wheezing. We will repeat a CT scan of the chest without contrast to compare with your priors. Congratulations on decreasing your cigarettes.  Goal.  Continue to work hard on this. Follow Dr. Delton Coombes in about 6 weeks so we can review your CT scan of the chest

## 2023-03-29 NOTE — Assessment & Plan Note (Signed)
Still with bronchitic symptoms.  I will treat with doxycycline to see if she gets relief.  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  She continues to have coarse breath sounds

## 2023-03-29 NOTE — Assessment & Plan Note (Signed)
Chest x-ray today, repeat CT scan of the chest.  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  May need sputum culture or BAL depending on CT chest results

## 2023-03-29 NOTE — Progress Notes (Signed)
Subjective:    Patient ID: Adriana Hall, female    DOB: Dec 20, 1970, 52 y.o.   MRN: [id number]  HPI  ROV 05/25/2022 --follow-up visit 52 year old woman with history of active tobacco use (50+ pack years).  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  She has been treated in the past for severe right-sided pneumonia with associated parapneumonic effusion.  She stopped smoking 4 days ago, previously had been averaging about.  She was seen in October and November in our office for flaring symptoms including cough, congestion, treated with steroids and antibiotics.  She was tried on an extended course of prednisone 10 mg as well.  CT chest was done 04/11/2022 as below. She tolerates prednisone poorly - causes some agitation and mood swings. She caught a URI 5 days ago, COVID negative.   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 She has been having trouble - more cough, productive of clear / green. She feels like a band across her chest. Her cig. are down to 4 a day. She has been treated x 2 w abx, pred x 1. Also changed symbicort + spiriva to breztri which she prefers.    Review of Systems As per HPI  Past Medical History:  Diagnosis Date   Abnormal CT of the chest 06/28/2021   Anxiety    10 years   Arrhythmia    5 years   Bipolar 1 disorder, depressed (HCC)    Bronchitis    Colitis 03/01/2011   Colonoscopy   Depression    10 years   Diabetes mellitus without complication (HCC) 10/10/2022   Headache(784.0)     Chronic for 30 yrs   Hemorrhoids 03/01/2011   Colonoscopy    Hypertension    HYPERTENSION, BENIGN 07/30/2009   Qualifier: Diagnosis of   By: Jolene Provost      HYPERTRIGLYCERIDEMIA 07/30/2009   Qualifier: Diagnosis of   By: Jolene Provost      Mass of right lung 11/21/2020   Post traumatic stress disorder (PTSD) 09/07/2010   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   Qualifier: Diagnosis of   By: Jolene Provost        Family History  Problem Relation Age of Onset   Pancreatic cancer Maternal Grandfather    Liver cancer Maternal Grandfather    Clotting disorder Mother    Heart disease Mother    Diabetes Paternal Grandmother        And PGF   Other Father        Brain tumor   Colon cancer Neg Hx      Social History   Socioeconomic History   Marital status: Married    Spouse name: Not on file   Number of children: 3   Years of education: Not on file   Highest education level: Not on file  Occupational History   Occupation: Self employed  Tobacco Use   Smoking status: Every Day    Current packs/day: 0.50    Average packs/day: 2.9 packs/day for 30.8  years (90.4 ttl pk-yrs)    Types: Cigarettes    Start date: 06/06/2022   Smokeless tobacco: Never   Tobacco comments:    Pt stated she quit 4 days ago. 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      Home Situation: lives with husband and daughter       Spiritual Beliefs: Christian      Lifestyle: no regular CV exercise; diet is ok            Social Determinants of Corporate investment banker Strain: Not on file  Food Insecurity: Not on file  Transportation Needs: No Transportation Needs (12/14/2022)   PRAPARE - Administrator, Civil Service (Medical): No    Lack of Transportation (Non-Medical): No  Physical Activity: Not on file  Stress: Not on file  Social Connections: Not on file  Intimate Partner Violence: Not on file     Allergies  Allergen Reactions   Biotin     'Makes me extremely sick'   Doxycycline Nausea And Vomiting     Outpatient Medications Prior to Visit  Medication Sig Dispense Refill   acetaminophen (TYLENOL) 325 MG tablet Take 2 tablets (650 mg total) by mouth every 6 (six) hours as needed for mild pain (or Fever >/= 101). 30 tablet 0   ALPRAZolam (XANAX) 0.5 MG tablet Take 0.5-1 tablets (0.25-0.5 mg total) by mouth daily as needed. 30 tablet 0   amoxicillin-clavulanate (AUGMENTIN) 875-125 MG tablet Take 1 tablet by mouth 2 (two) times daily. 14 tablet 0   Blood Glucose Monitoring Suppl DEVI 1 each by Does not apply route in the morning, at noon, and at bedtime. May substitute to any manufacturer covered by patient's insurance. 1 each 0   Budeson-Glycopyrrol-Formoterol (BREZTRI AEROSPHERE) 160-9-4.8 MCG/ACT AERO Inhale 2 puffs into the lungs 2 (two) times daily. 10.7 g 11   Clotrimazole 1 % LOTN Apply 1 Application topically in the morning and at bedtime. Apply to affected areas (under breasts and in the external groin region) twice daily for 2 weeks. 30 mL 2   diphenhydrAMINE (BENADRYL) 25 MG tablet Take 50 mg by mouth at bedtime.     fluconazole (DIFLUCAN) 150 MG tablet Take 1 tablet (150 mg total) by mouth once a week. 4 tablet 0   fluticasone (FLONASE) 50 MCG/ACT nasal spray Place 1 spray into both nostrils daily. 18.2 mL 2   fluvoxaMINE (LUVOX) 100 MG tablet Take 1 tablet (100 mg total) by mouth  at bedtime. 90 tablet 1   gabapentin (NEURONTIN) 400 MG capsule Take 1 capsule (400 mg total) by mouth 3 (three) times daily. 90 capsule 0   hydrOXYzine (VISTARIL) 25 MG capsule Take 1 capsule (25 mg total) by mouth 3 (three) times daily as needed for anxiety. 30 capsule 2   ibuprofen (ADVIL) 200 MG tablet Take 400 mg by mouth every 6 (six) hours as needed for fever or  moderate pain.     ipratropium-albuterol (DUONEB) 0.5-2.5 (3) MG/3ML SOLN TAKE 3 MLS BY NEBULIZATION EVERY 6 (SIX) HOURS AS NEEDED. 360 mL 3   nicotine (NICODERM CQ - DOSED IN MG/24 HOURS) 21 mg/24hr patch Place 1 patch (21 mg total) onto the skin daily. 28 patch 1   nitroGLYCERIN (NITROLINGUAL) 0.4 MG/SPRAY spray Place 1 spray under the tongue once as needed for chest pain. 12 g 0   Nystatin POWD Apply 1 each topically 3 (three) times daily as needed. Apply liberally to affected area 3 times per day as needed. 1 each 2   OLANZapine (ZYPREXA) 10 MG tablet Take 1 tablet (10 mg total) by mouth at bedtime. 30 tablet 2   omeprazole (PRILOSEC) 40 MG capsule Take 40 mg by mouth daily.     ondansetron (ZOFRAN-ODT) 4 MG disintegrating tablet Take 1 tablet (4 mg total) by mouth every 8 (eight) hours as needed for nausea or vomiting. 20 tablet 1   Semaglutide,0.25 or 0.5MG /DOS, (OZEMPIC, 0.25 OR 0.5 MG/DOSE,) 2 MG/3ML SOPN Inject 0.5 mg into the skin once a week. 3 mL 2   Skin Protectants, Misc. (INTERDRY 78"G956") SHEE Apply 1 each topically in the morning, at noon, and at bedtime. 1 each 3   Spacer/Aero-Holding Chambers (AEROCHAMBER MV) inhaler Use as instructed 1 each 2   traZODone (DESYREL) 100 MG tablet Take 2 tablets (200 mg total) by mouth at bedtime as needed for sleep. 60 tablet 1   VENTOLIN HFA 108 (90 Base) MCG/ACT inhaler TAKE 2 PUFFS BY MOUTH EVERY 6 HOURS AS NEEDED FOR WHEEZE OR SHORTNESS OF BREATH 18 each 2   No facility-administered medications prior to visit.         Objective:   Physical Exam Vitals:   03/29/23 1048  03/29/23 1051  BP: (!) 151/76 (!) 151/76  Pulse:  93  Temp:  98.2 F (36.8 C)  TempSrc:  Oral  SpO2:  97%  Weight:  196 lb 3.2 oz (89 kg)  Height:  5\' 4"  (1.626 m)   Gen: Pleasant,ill-appearing woman, in no distress,  normal affect, O2 in place  ENT: No lesions,  mouth clear,  oropharynx clear, no postnasal drip,  Neck: No JVD, no stridor  Lungs: No use of accessory muscles, coarse bilateral inspiratory rhonchi, no overt wheezing  Cardiovascular: RRR, heart sounds normal, no murmur or gallops, no edema  Musculoskeletal: No deformities, no cyanosis or clubbing  Neuro: alert, awake, non focal  Skin: Warm, no lesions or rash      Assessment & Plan:   Acute bronchitis with COPD (HCC) Still with bronchitic symptoms.  I will treat with doxycycline to see if she gets relief.  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  She continues to have coarse breath sounds  COPD (chronic obstructive pulmonary disease) (HCC) She prefers the [employer].  We will have to ensure that she can obtain it with her insurance.   Agree with continuing Breztri 2 puffs twice daily.  Rinse and gargle after you use it. Keep your albuterol available to use 2 puffs when needed for shortness of breath, chest tightness, wheezing. Follow Dr. 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  May need sputum culture or BAL depending on  CT chest results  Tobacco abuse Continues to smoke.  Did discuss importance of cessation with her today     Levy Pupa, MD, PhD 03/29/2023, 4:44 PM [facility] [phone] or if no answer before 7:00PM call [phone] For any issues after 7:00PM please call eLink [phone]

## 2023-03-30 ENCOUNTER — Other Ambulatory Visit: Payer: Self-pay | Admitting: Emergency Medicine

## 2023-03-30 ENCOUNTER — Telehealth: Payer: Self-pay | Admitting: Emergency Medicine

## 2023-03-30 MED ORDER — LEVOFLOXACIN 250 MG PO TABS: 250.0000 mg | ORAL_TABLET | Freq: Every day | ORAL | 0 refills | Status: DC

## 2023-03-30 NOTE — Telephone Encounter (Signed)
PT calling stating the DOXY that was RX's yesterday is making her nauseous and vomiting so she is not getting it in her system. Can she get an alternative? Pls call @ [phone]   Ilda Basset is [facility] on [address] (But double check. She is not sure.) Where the last doxy went.

## 2023-03-30 NOTE — Telephone Encounter (Signed)
Let's try changing her to levaquin 250mg  every day x 7 dyas

## 2023-03-30 NOTE — Telephone Encounter (Signed)
I called and spoke with the pt and notified of response per Dr Delton Coombes  She verbalized understanding  Rx was sent  Nothing further needed

## 2023-03-30 NOTE — Telephone Encounter (Signed)
Spoke with the pt  She states that she has been vomiting since she started Doxy last night  She has a known allergy to this  Please advise on alternative  Thanks!  Allergies  Allergen Reactions   Biotin     'Makes me extremely sick'   Doxycycline Nausea And Vomiting

## 2023-04-10 ENCOUNTER — Other Ambulatory Visit: Payer: MEDICAID

## 2023-04-20 ENCOUNTER — Other Ambulatory Visit: Payer: MEDICAID

## 2023-04-24 ENCOUNTER — Telehealth: Payer: Self-pay | Admitting: Emergency Medicine

## 2023-04-24 NOTE — Telephone Encounter (Signed)
Dr.Byrum pt is requesting something to be sent in for thrush

## 2023-04-24 NOTE — Telephone Encounter (Signed)
Bad case of yeast. Moth, lips, and bow under breast. PT wonders if we can call in something.He been Rx'd pills before  Pharm is: Therapist, occupational at [facility]

## 2023-04-24 NOTE — Telephone Encounter (Signed)
Ok to send fluconazole 100mg  every day x 4 days

## 2023-04-25 MED ORDER — FLUCONAZOLE 100 MG PO TABS: 100.0000 mg | ORAL_TABLET | Freq: Every day | ORAL | 0 refills | Status: DC

## 2023-04-25 NOTE — Telephone Encounter (Signed)
Called and spoke with pt sending in medications. Pt verbalized understanding and confirmed pharmacy. Nfn

## 2023-04-27 ENCOUNTER — Encounter (HOSPITAL_BASED_OUTPATIENT_CLINIC_OR_DEPARTMENT_OTHER): Payer: Self-pay | Admitting: Family Medicine

## 2023-04-27 ENCOUNTER — Ambulatory Visit (HOSPITAL_BASED_OUTPATIENT_CLINIC_OR_DEPARTMENT_OTHER): Payer: MEDICAID | Admitting: Cardiology

## 2023-04-29 ENCOUNTER — Other Ambulatory Visit: Payer: Self-pay | Admitting: Emergency Medicine

## 2023-05-01 ENCOUNTER — Other Ambulatory Visit (HOSPITAL_BASED_OUTPATIENT_CLINIC_OR_DEPARTMENT_OTHER): Payer: Self-pay

## 2023-05-01 DIAGNOSIS — F3181 Bipolar II disorder: Secondary | ICD-10-CM

## 2023-05-01 MED ORDER — OLANZAPINE 10 MG PO TABS: 10.0000 mg | ORAL_TABLET | Freq: Every day | ORAL | 2 refills | Status: AC

## 2023-05-09 ENCOUNTER — Other Ambulatory Visit (HOSPITAL_BASED_OUTPATIENT_CLINIC_OR_DEPARTMENT_OTHER): Payer: Self-pay | Admitting: Family Medicine

## 2023-05-09 NOTE — Telephone Encounter (Signed)
Copied from CRM [id number]. Topic: Clinical - Medication Refill >> May 09, 2023 12:10 PM Desma Mcgregor wrote: Most Recent Primary Care Visit:  Provider: Alyson Reedy  Department: [facility]  Visit Type: FOLLOW UP  Date: 03/14/2023  Medication: ***  Has the patient contacted their pharmacy?  (Agent: If no, request that the patient contact the pharmacy for the refill. If patient does not wish to contact the pharmacy document the reason why and proceed with request.) (Agent: If yes, when and what did the pharmacy advise?)  Is this the correct pharmacy for this prescription?  If no, delete pharmacy and type the correct one.  This is the patient's preferred pharmacy:  [id number] - [place], [place] - [address] AT [facility] OF [address] [place] [address] Phone: [phone] Fax: [phone]  [id number] - [place], [place] - [address] [place] [address] Phone: [phone] Fax: [phone]   Has the prescription been filled recently?   Is the patient out of the medication?   Has the patient been seen for an appointment in the last year OR does the patient have an upcoming appointment?   Can we respond through MyChart?   Agent: Please be advised that Rx refills may take up to 3 business days. We ask that you follow-up with your pharmacy.

## 2023-05-17 ENCOUNTER — Ambulatory Visit: Payer: MEDICAID | Attending: Cardiology | Admitting: Cardiology

## 2023-05-18 ENCOUNTER — Inpatient Hospital Stay: Admission: RE | Admit: 2023-05-18 | Payer: MEDICAID | Source: Ambulatory Visit

## 2023-05-19 ENCOUNTER — Telehealth: Payer: MEDICAID | Admitting: Emergency Medicine

## 2023-06-01 ENCOUNTER — Ambulatory Visit: Payer: MEDICAID | Admitting: Dermatology

## 2023-06-14 ENCOUNTER — Ambulatory Visit (HOSPITAL_BASED_OUTPATIENT_CLINIC_OR_DEPARTMENT_OTHER): Payer: MEDICAID | Admitting: Family Medicine

## 2023-06-16 ENCOUNTER — Ambulatory Visit (HOSPITAL_BASED_OUTPATIENT_CLINIC_OR_DEPARTMENT_OTHER): Payer: MEDICAID | Admitting: Family Medicine

## 2023-06-19 ENCOUNTER — Telehealth: Payer: Self-pay | Admitting: *Deleted

## 2023-06-19 NOTE — Telephone Encounter (Signed)
 Pt needs to get rescheduled for her CT Chest appt and ov with Byrum after   Routing to PCC's, thanks!

## 2023-06-19 NOTE — Telephone Encounter (Signed)
 I'll Reschedule and contact PT.

## 2023-06-20 ENCOUNTER — Telehealth: Payer: MEDICAID | Admitting: Emergency Medicine

## 2023-06-20 ENCOUNTER — Telehealth (HOSPITAL_BASED_OUTPATIENT_CLINIC_OR_DEPARTMENT_OTHER): Payer: Self-pay | Admitting: Family Medicine

## 2023-06-20 DIAGNOSIS — E119 Type 2 diabetes mellitus without complications: Secondary | ICD-10-CM

## 2023-06-20 NOTE — Telephone Encounter (Signed)
 Patient is a former pt of Alyson Reedy. Fax received for a refill request of pt's ozempic.  Pt does not have a f/u scheduled with either of our providers since her last visit with Foye Clock.  Please advise if you are okay refilling medication for pt.

## 2023-06-21 ENCOUNTER — Other Ambulatory Visit: Payer: MEDICAID

## 2023-06-21 MED ORDER — OZEMPIC (0.25 OR 0.5 MG/DOSE) 2 MG/3ML ~~LOC~~ SOPN: 0.5000 mg | PEN_INJECTOR | SUBCUTANEOUS | 2 refills | Status: DC

## 2023-06-21 NOTE — Telephone Encounter (Signed)
 Called and spoke with pt about her ozempic  medication. Let her know that Dr. De Peru said it was okay for us  to refill med for her and she verbalized understanding.  Made pt aware that Merritt Ables was no longer with [facility] and asked if she would like to get established with one of the providers we still had here and she said that would be fine. Scheduled pt an appt with Sylvester Evert. Nothing further needed.

## 2023-07-04 ENCOUNTER — Ambulatory Visit (HOSPITAL_BASED_OUTPATIENT_CLINIC_OR_DEPARTMENT_OTHER): Payer: MEDICAID | Admitting: Family Medicine

## 2023-07-07 ENCOUNTER — Ambulatory Visit (HOSPITAL_BASED_OUTPATIENT_CLINIC_OR_DEPARTMENT_OTHER): Payer: MEDICAID | Admitting: Family Medicine

## 2023-07-10 ENCOUNTER — Other Ambulatory Visit: Payer: MEDICAID

## 2023-07-18 ENCOUNTER — Telehealth: Payer: Self-pay | Admitting: Emergency Medicine

## 2023-07-18 NOTE — Telephone Encounter (Signed)
Patient states needs refill for Ventolin inhaler. Pharmacy is [facility] [address]. Patient out of the medication. Patient phone number is [phone].

## 2023-07-18 NOTE — Telephone Encounter (Signed)
I called and spoke to pt. Pt states she has been taking Advair for 3 months prescribed by PCP. Her PCP told her to stop taking Symbicort and Spiriva and was put on [place]. Insurance does not cover Breztri so pt is taking Advair BID. Pt had a bad flair up over the weekend. Pt is still doing her duoneb treatments. Pt states she has been coughing and wheezing. The cough is wet, with a brown mucous color. Pt has been taking mucinex dm daily. Pt also wants a refill on her Ventolin inhaler. The Ventolin shows it has been discontinued. Please advise what inhalers the pt should be taking, and send a refill on Ventolin if you want her to continue that as well.

## 2023-07-19 ENCOUNTER — Ambulatory Visit: Payer: MEDICAID | Admitting: Family Medicine

## 2023-07-19 MED ORDER — AZITHROMYCIN 250 MG PO TABS: ORAL_TABLET | ORAL | 0 refills | Status: AC

## 2023-07-19 NOTE — Telephone Encounter (Signed)
Yes - I think she should restart the spiriva

## 2023-07-19 NOTE — Telephone Encounter (Signed)
Spoke to patient and relayed below message/recommendations. She voiced her understanding. She has advair and Spiriva on hand.  She is producing brown sputum. Zpak has been sent to preferred pharmacy.  Appt scheduled 07/28/2023. Offered sooner apt but pt prefers to see Dr. Delton Coombes.   Dr. Delton Coombes, please advise if you would like patient to add spiriva since she has it on hand?

## 2023-07-19 NOTE — Telephone Encounter (Signed)
Pt is aware and voiced her understanding.  Nothing further needed.

## 2023-07-19 NOTE — Telephone Encounter (Signed)
Okay for her to continue the Advair twice daily for now if that is the inhaler she has available.  In the past she has benefited more from Eleanor but if it is unaffordable then we likely need to add on to the Advair (she has been on Spiriva before).  I think she needs to be seen in the office to sort out symptoms, side effects, determine cost and get her on the best regimen.  If she is having purulent mucus would recommend giving her prescription for azithromycin Z-Pak to see if she gets benefit

## 2023-07-25 ENCOUNTER — Encounter (HOSPITAL_BASED_OUTPATIENT_CLINIC_OR_DEPARTMENT_OTHER): Payer: Self-pay

## 2023-07-26 ENCOUNTER — Ambulatory Visit (HOSPITAL_BASED_OUTPATIENT_CLINIC_OR_DEPARTMENT_OTHER): Payer: MEDICAID | Admitting: Family Medicine

## 2023-07-26 ENCOUNTER — Encounter (HOSPITAL_BASED_OUTPATIENT_CLINIC_OR_DEPARTMENT_OTHER): Payer: Self-pay | Admitting: Family Medicine

## 2023-07-26 ENCOUNTER — Telehealth (HOSPITAL_BASED_OUTPATIENT_CLINIC_OR_DEPARTMENT_OTHER): Payer: MEDICAID | Admitting: Family Medicine

## 2023-07-26 VITALS — Ht 64.0 in | Wt 195.0 lb

## 2023-07-26 DIAGNOSIS — B9689 Other specified bacterial agents as the cause of diseases classified elsewhere: Secondary | ICD-10-CM

## 2023-07-26 DIAGNOSIS — J019 Acute sinusitis, unspecified: Secondary | ICD-10-CM | POA: Diagnosis not present

## 2023-07-26 DIAGNOSIS — Z1231 Encounter for screening mammogram for malignant neoplasm of breast: Secondary | ICD-10-CM

## 2023-07-26 DIAGNOSIS — Z1211 Encounter for screening for malignant neoplasm of colon: Secondary | ICD-10-CM

## 2023-07-26 DIAGNOSIS — E119 Type 2 diabetes mellitus without complications: Secondary | ICD-10-CM

## 2023-07-26 DIAGNOSIS — Z7985 Long-term (current) use of injectable non-insulin antidiabetic drugs: Secondary | ICD-10-CM | POA: Diagnosis not present

## 2023-07-26 MED ORDER — FLUCONAZOLE 150 MG PO TABS
150.0000 mg | ORAL_TABLET | Freq: Once | ORAL | 0 refills | Status: AC
Start: 2023-07-26 — End: 2023-07-26

## 2023-07-26 MED ORDER — AMOXICILLIN-POT CLAVULANATE 875-125 MG PO TABS
1.0000 | ORAL_TABLET | Freq: Two times a day (BID) | ORAL | 0 refills | Status: AC
Start: 2023-07-26 — End: 2023-08-02

## 2023-07-26 MED ORDER — ONDANSETRON HCL 4 MG PO TABS: 4.0000 mg | ORAL_TABLET | Freq: Three times a day (TID) | ORAL | 1 refills | Status: DC | PRN

## 2023-07-26 MED ORDER — OZEMPIC (0.25 OR 0.5 MG/DOSE) 2 MG/3ML ~~LOC~~ SOPN: 0.5000 mg | PEN_INJECTOR | SUBCUTANEOUS | 2 refills | Status: AC
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
## 2023-07-27 ENCOUNTER — Telehealth: Payer: Self-pay

## 2023-07-27 NOTE — Telephone Encounter (Signed)
 I called pt on home phone and verified dob. Pt's last ov 03/29/23, Dr. Delton Coombes wanted to get a chest ct before returning to office. Pt has not got her scan done. She is going to arrange to get her ct done and call our office to reschedule her appointment that was made for 07/28/23.

## 2023-07-27 NOTE — Telephone Encounter (Signed)
 Dr. Delton Coombes has agreed to seeing this pt for her appt tomorrow. Pt is aware of appointment tmr, and is going to trying get ct scan scheduled soon. NFN

## 2023-07-28 ENCOUNTER — Ambulatory Visit: Payer: MEDICAID | Admitting: Emergency Medicine

## 2023-08-09 ENCOUNTER — Ambulatory Visit (HOSPITAL_BASED_OUTPATIENT_CLINIC_OR_DEPARTMENT_OTHER): Payer: MEDICAID | Admitting: Family Medicine

## 2023-08-15 ENCOUNTER — Inpatient Hospital Stay (HOSPITAL_BASED_OUTPATIENT_CLINIC_OR_DEPARTMENT_OTHER): Admission: RE | Admit: 2023-08-15 | Payer: MEDICAID | Source: Ambulatory Visit | Admitting: Radiology

## 2023-08-22 ENCOUNTER — Ambulatory Visit: Payer: MEDICAID | Admitting: Dermatology

## 2023-08-30 ENCOUNTER — Ambulatory Visit: Payer: MEDICAID | Admitting: Emergency Medicine

## 2023-09-01 ENCOUNTER — Ambulatory Visit (HOSPITAL_BASED_OUTPATIENT_CLINIC_OR_DEPARTMENT_OTHER): Payer: MEDICAID | Admitting: Radiology

## 2023-09-05 ENCOUNTER — Ambulatory Visit (HOSPITAL_BASED_OUTPATIENT_CLINIC_OR_DEPARTMENT_OTHER): Payer: Medicaid Other | Admitting: Family Medicine

## 2023-09-07 ENCOUNTER — Other Ambulatory Visit: Payer: MEDICAID

## 2023-10-11 ENCOUNTER — Ambulatory Visit (HOSPITAL_BASED_OUTPATIENT_CLINIC_OR_DEPARTMENT_OTHER): Payer: MEDICAID | Admitting: Family Medicine

## 2023-10-19 ENCOUNTER — Ambulatory Visit: Payer: Self-pay

## 2023-10-19 NOTE — Telephone Encounter (Signed)
 Summary: bronchitis   Copied From CRM [id number]. Reason for Triage: Pt called in with S.O. B. Hasn't been feeling well and believes she has bronchitis, has a slight fever         Chief Complaint: couch with yellow/brown phlegm, runny nose, wheezing Symptoms: see above Frequency: constant  Pertinent Negatives: Patient denies cp, fever Disposition: [] ED /[] Urgent Care (no appt availability in office) / [x] Appointment(In office/virtual)/ []  [place] [facility]/ [] Home Care/ [] Refused Recommended Disposition /[] [place] [facility]/ []  Follow-up with PCP Additional Notes: apt made for tomorrow per protocol; care advice given, denies questions; instructed to go to ER if becomes worse.   Reason for Disposition  [1] MILD difficulty breathing (e.g., minimal/no SOB at rest, SOB with walking, pulse <100) AND [2] still present when not coughing  Answer Assessment - Initial Assessment Questions 1. ONSET: "When did the cough begin?"      Chest heaviness, coughing phlegm 2. SEVERITY: "How bad is the cough today?"      severe 3. SPUTUM: "Describe the color of your sputum" (none, dry cough; clear, white, yellow, green)     Hx copd, yellow 4. HEMOPTYSIS: "Are you coughing up any blood?" If so ask: "How much?" (flecks, streaks, tablespoons, etc.)     no 5. DIFFICULTY BREATHING: "Are you having difficulty breathing?" If Yes, ask: "How bad is it?" (e.g., mild, moderate, severe)    - MILD: No SOB at rest, mild SOB with walking, speaks normally in sentences, can lie down, no retractions, pulse < 100.    - MODERATE: SOB at rest, SOB with minimal exertion and prefers to sit, cannot lie down flat, speaks in phrases, mild retractions, audible wheezing, pulse 100-120.    - SEVERE: Very SOB at rest, speaks in single words, struggling to breathe, sitting hunched forward, retractions, pulse > 120      moderate 6. FEVER: "Do you have a fever?" If Yes, ask: "What is your temperature, how was it measured, and  when did it start?"     no 7. CARDIAC HISTORY: "Do you have any history of heart disease?" (e.g., heart attack, congestive heart failure)      denies 8. LUNG HISTORY: "Do you have any history of lung disease?"  (e.g., pulmonary embolus, asthma, emphysema)     copd 9. PE RISK FACTORS: "Do you have a history of blood clots?" (or: recent major surgery, recent prolonged travel, bedridden)     no 10. OTHER SYMPTOMS: "Do you have any other symptoms?" (e.g., runny nose, wheezing, chest pain)       Runny nose wheezing 11. PREGNANCY: "Is there any chance you are pregnant?" "When was your last menstrual period?"       na 12. TRAVEL: "Have you traveled out of the country in the last month?" (e.g., travel history, exposures)       no  Protocols used: Cough - Acute Productive-A-AH

## 2023-10-20 ENCOUNTER — Encounter (HOSPITAL_BASED_OUTPATIENT_CLINIC_OR_DEPARTMENT_OTHER): Payer: Self-pay | Admitting: Family Medicine

## 2023-10-20 ENCOUNTER — Ambulatory Visit (HOSPITAL_BASED_OUTPATIENT_CLINIC_OR_DEPARTMENT_OTHER): Payer: MEDICAID | Admitting: Family Medicine

## 2023-11-07 ENCOUNTER — Ambulatory Visit: Payer: Self-pay

## 2023-11-07 NOTE — Telephone Encounter (Signed)
 Copied from CRM [id number]. Topic: Clinical - Red Word Triage >> Nov 07, 2023  2:19 PM Adriana Hall wrote: Red Word that prompted transfer to Nurse Triage: Pt is having really bad chest tightness, difficulty breathing, and had to up her oxygen intake. This has been happening for about two weeks. Pt upped oxygen to 3. Pt typically sees Dr. Baldwin Levee. Pt currently has an appt on 01/03/2024 at 230pm with NP Eulas Hick.   E2C2 Pulmonary Triage - Initial Assessment Questions "Chief Complaint (e.g., cough, sob, wheezing, fever, chills, sweat or additional symptoms) *Go to specific symptom protocol after initial questions. Shortness of breath   "How long have symptoms been present?" 2 weeks  Have you tested for COVID or Flu? Note: If not, ask patient if a home test can be taken. If so, instruct patient to call back for positive results. No  MEDICINES:   "Have you used any OTC meds to help with symptoms?" Yes If yes, ask "What medications?" Tylenol , Ibuprofen , and Mucinex  DM  "Have you used your inhalers/maintenance medication?" Yes If yes, "What medications?" Nebulizer   If inhaler, ask "How many puffs and how often?" Note: Review instructions on medication in the chart. N/A  OXYGEN: "Do you wear supplemental oxygen?" Yes If yes, "How many liters are you supposed to use?" 2 L, now waring 3 L  "Do you monitor your oxygen levels?" No    Reason for Disposition  [1] MODERATE longstanding difficulty breathing (e.g., speaks in phrases, SOB even at rest, pulse 100-120) AND [2] SAME as normal    Worse than normal. Pulm patient, scheduled on 6/5  Answer Assessment - Initial Assessment Questions 1. RESPIRATORY STATUS: "Describe your breathing?" (e.g., wheezing, shortness of breath, unable to speak, severe coughing)      Shortness of breath  2. ONSET: "When did this breathing problem begin?"      2 weeks 4. SEVERITY: "How bad is your breathing?" (e.g., mild, moderate, severe)    - MILD: No SOB at  rest, mild SOB with walking, speaks normally in sentences, can lie down, no retractions, pulse < 100.    - MODERATE: SOB at rest, SOB with minimal exertion and prefers to sit, cannot lie down flat, speaks in phrases, mild retractions, audible wheezing, pulse 100-120.    - SEVERE: Very SOB at rest, speaks in single words, struggling to breathe, sitting hunched forward, retractions, pulse > 120      Moderate 5. RECURRENT SYMPTOM: "Have you had difficulty breathing before?" If Yes, ask: "When was the last time?" and "What happened that time?"      Yes 6. CARDIAC HISTORY: "Do you have any history of heart disease?" (e.g., heart attack, angina, bypass surgery, angioplasty)      No 7. LUNG HISTORY: "Do you have any history of lung disease?"  (e.g., pulmonary embolus, asthma, emphysema)     Yes 8. CAUSE: "What do you think is causing the breathing problem?"      Believes she has a cold  9. OTHER SYMPTOMS: "Do you have any other symptoms? (e.g., dizziness, runny nose, cough, chest pain, fever)     Cough, chest congestion/tightness improved with nebulizer  10. O2 SATURATION MONITOR:  "Do you use an oxygen saturation monitor (pulse oximeter) at home?" If Yes, ask: "What is your reading (oxygen level) today?" "What is your usual oxygen saturation reading?" (e.g., 95%)       No  Protocols used: Breathing Difficulty-A-AH

## 2023-11-09 ENCOUNTER — Encounter: Payer: Self-pay | Admitting: Pulmonary Disease

## 2023-11-09 ENCOUNTER — Ambulatory Visit (INDEPENDENT_AMBULATORY_CARE_PROVIDER_SITE_OTHER): Payer: MEDICAID | Admitting: Pulmonary Disease

## 2023-11-09 DIAGNOSIS — E66811 Obesity, class 1: Secondary | ICD-10-CM

## 2023-11-09 DIAGNOSIS — F17210 Nicotine dependence, cigarettes, uncomplicated: Secondary | ICD-10-CM

## 2023-11-09 DIAGNOSIS — E119 Type 2 diabetes mellitus without complications: Secondary | ICD-10-CM

## 2023-11-09 DIAGNOSIS — Z6831 Body mass index (BMI) 31.0-31.9, adult: Secondary | ICD-10-CM

## 2023-11-09 DIAGNOSIS — J441 Chronic obstructive pulmonary disease with (acute) exacerbation: Secondary | ICD-10-CM

## 2023-11-09 MED ORDER — FLUCONAZOLE 100 MG PO TABS: 100.0000 mg | ORAL_TABLET | Freq: Every day | ORAL | 0 refills | Status: AC

## 2023-11-09 MED ORDER — AMOXICILLIN-POT CLAVULANATE 875-125 MG PO TABS: 1.0000 | ORAL_TABLET | Freq: Two times a day (BID) | ORAL | 0 refills | Status: AC

## 2023-11-09 MED ORDER — TIOTROPIUM BROMIDE MONOHYDRATE 18 MCG IN CAPS
18.0000 ug | ORAL_CAPSULE | Freq: Every day | RESPIRATORY_TRACT | 2 refills | Status: DC
Start: 2023-11-09 — End: 2024-02-08

## 2023-11-09 MED ORDER — ALBUTEROL SULFATE HFA 108 (90 BASE) MCG/ACT IN AERS: 2.0000 | INHALATION_SPRAY | RESPIRATORY_TRACT | 2 refills | Status: AC | PRN

## 2023-11-09 MED ORDER — BUDESONIDE-FORMOTEROL FUMARATE 160-4.5 MCG/ACT IN AERO: 2.0000 | INHALATION_SPRAY | Freq: Two times a day (BID) | RESPIRATORY_TRACT | 6 refills | Status: DC

## 2023-11-09 MED ORDER — PREDNISONE 10 MG PO TABS: 20.0000 mg | ORAL_TABLET | Freq: Every day | ORAL | 0 refills | Status: DC

## 2023-11-09 MED ORDER — ONDANSETRON HCL 4 MG PO TABS: 4.0000 mg | ORAL_TABLET | Freq: Three times a day (TID) | ORAL | 1 refills | Status: AC | PRN

## 2023-11-09 NOTE — Patient Instructions (Addendum)
  Will see in about 3 months  Continue to work on quitting smoking  Prescription for Augmentin  and steroids  Diflucan  for yeast infection-take to the first day and then 1 daily  Prednisone  20 mg, you can cut it down to 10 if not tolerated  Continue using your inhalers including Symbicort  and Spiriva   Call us  with significant concerns
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
## 2023-11-17 ENCOUNTER — Telehealth: Payer: Self-pay | Admitting: Pulmonary Disease

## 2023-11-17 NOTE — Telephone Encounter (Signed)
 PT calling because O2 company is coming to take her O2 if we do not intercede. Please call to advise. [phone]

## 2023-11-17 NOTE — Telephone Encounter (Signed)
 Spoke with patient regarding prior message . Patient does have a f/u with [facility] 01/03/2024.  Patient's voice was understanding.Nothing else further needed

## 2023-12-05 ENCOUNTER — Telehealth: Payer: Self-pay | Admitting: Pulmonary Disease

## 2023-12-05 NOTE — Telephone Encounter (Unsigned)
 Copied from CRM [id number]. Topic: Clinical - Medication Refill >> Dec 05, 2023  4:07 PM Dustin F wrote: Medication: Tiotropium Bromide  Monohydrate (SPIRIVA  RESPIMAT) 2.5 MCG/ACT AERS [[id number]]  Has the patient contacted their pharmacy? Yes; pt's prescription is discontinued. (Agent: If no, request that the patient contact the pharmacy for the refill. If patient does not wish to contact the pharmacy document the reason why and proceed with request.) (Agent: If yes, when and what did the pharmacy advise?)  This is the patient's preferred pharmacy:  [id number] - [place], [place] - [address] AT [facility] OF [address] [place] [address] Phone: [phone] Fax: [phone]  Is this the correct pharmacy for this prescription? Yes If no, delete pharmacy and type the correct one.   Has the prescription been filled recently? No  Is the patient out of the medication? Yes; pt stated she had lost her inhaler.  Has the patient been seen for an appointment in the last year OR does the patient have an upcoming appointment? Yes  Can we respond through MyChart? Yes  Agent: Please be advised that Rx refills may take up to 3 business days. We ask that you follow-up with your pharmacy.

## 2023-12-05 NOTE — Telephone Encounter (Signed)
 Pt requesting expired medication: Tiotropium Bromide  Monohydrate (SPIRIVA  RESPIMAT) 2.5 MCG/ACT AERS [[id number]]   Upon further investigation, pt has a different medication on active med list: tiotropium (SPIRIVA  HANDIHALER) 18 MCG inhalation capsule [[id number]]   Please clarify and advise.

## 2023-12-06 NOTE — Telephone Encounter (Signed)
 Rx was actually sent on 11/09/2023 and pharmacy confirms they have this RX with Rf. They will have to order this and should be in tomorrow.Pt notified on VM

## 2023-12-11 ENCOUNTER — Ambulatory Visit: Payer: Self-pay | Admitting: Pulmonary Disease

## 2023-12-11 NOTE — Telephone Encounter (Signed)
 FYI Only or Action Required?: FYI only for provider.  Patient is followed in Pulmonology for COPD, last seen on 11/09/2023 by Neda Jennet LABOR, MD. Called Nurse Triage reporting Shortness of Breath and Cough. Symptoms began per patient report, symptoms started three days after finishing 7 day run of antibiotics. Interventions attempted: Rescue inhaler, Maintenance inhaler, Home oxygen use, and Increased fluids/rest. Symptoms are: gradually worsening.  Triage Disposition: Go to ED Now (Notify PCP)  Patient/caregiver understands and will follow disposition?: Yes  Copied from CRM [id number]. Topic: Clinical - Red Word Triage >> Dec 11, 2023  9:46 AM Joesph PARAS wrote: Red Word that prompted transfer to Nurse Triage: reoccurring bronchitis-like symptoms. States has been off antibiotics for a week and a half and symptoms are returning, has been sick the whole weekend.    ----------------------------------------------------------------------- From previous Reason for Contact - Scheduling: Patient/patient representative is calling to schedule an appointment. Refer to attachments for appointment information. Reason for Disposition  [1] MODERATE difficulty breathing (e.g., speaks in phrases, SOB even at rest, pulse 100-120) AND [2] NEW-onset or WORSE than normal  Answer Assessment - Initial Assessment Questions 1. RESPIRATORY STATUS: Describe your breathing? (e.g., wheezing, shortness of breath, unable to speak, severe coughing)      Shortness of breath 2. ONSET: When did this breathing problem begin?      Started three days after finishing 7 day antibiotic 3. PATTERN Does the difficult breathing come and go, or has it been constant since it started?      constant 4. SEVERITY: How bad is your breathing? (e.g., mild, moderate, severe)    - MILD: No SOB at rest, mild SOB with walking, speaks normally in sentences, can lie down, no retractions, pulse < 100.    - MODERATE: SOB at rest, SOB with  minimal exertion and prefers to sit, cannot lie down flat, speaks in phrases, mild retractions, audible wheezing, pulse 100-120.    - SEVERE: Very SOB at rest, speaks in single words, struggling to breathe, sitting hunched forward, retractions, pulse > 120      Moderate close to severe 5. RECURRENT SYMPTOM: Have you had difficulty breathing before? If Yes, ask: When was the last time? and What happened that time?      yes 6. CARDIAC HISTORY: Do you have any history of heart disease? (e.g., heart attack, angina, bypass surgery, angioplasty)      no 7. LUNG HISTORY: Do you have any history of lung disease?  (e.g., pulmonary embolus, asthma, emphysema)     COPD. Interstitial lung disease 8. CAUSE: What do you think is causing the breathing problem?      unsure 9. OTHER SYMPTOMS: Do you have any other symptoms? (e.g., dizziness, runny nose, cough, chest pain, fever)     Cough, chest pain, low grade fever-100.3 10. O2 SATURATION MONITOR:  Do you use an oxygen saturation monitor (pulse oximeter) at home? If Yes, ask: What is your reading (oxygen level) today? What is your usual oxygen saturation reading? (e.g., 95%)       Patient doesn't have one.  12. TRAVEL: Have you traveled out of the country in the last month? (e.g., travel history, exposures)       No  Patient reports having increased shortness of breath with productive cough with yellow sputum. Patient reports a sour taste in her mouth with the sputum. Patient endorses low grade fevers for the last week. Highest temperature of 100.3. patient recommended to the ED due to symptoms. Patient verbalized understanding  and states she will head to the ED.  Protocols used: Breathing Difficulty-A-AH

## 2023-12-12 ENCOUNTER — Ambulatory Visit: Payer: Self-pay

## 2023-12-12 NOTE — Telephone Encounter (Signed)
 FYI Only or Action Required?: FYI only for provider.  Patient is followed in Pulmonology for COPD, last seen on 11/09/2023 by Neda Jennet LABOR, MD.  Called Nurse Triage reporting Cough.  Symptoms began several weeks ago.  Interventions attempted: Rescue inhaler.  Symptoms are: gradually worsening.  Triage Disposition: Go to ED Now (Notify PCP)  Patient/caregiver understands and will follow disposition?: Yes                              Copied from CRM [id number]. Topic: Clinical - Red Word Triage >> Dec 12, 2023 11:58 AM Nathanel DEL wrote: Red Word that prompted transfer to Nurse Triage: pt seen 2 wks ago for acute.  Now that abx are gone, she is worse than before. SOB Constant wheezing.  Coughing up lots of phelgm, low grade fever Reason for Disposition  Chest pain  (Exception: MILD central chest pain, present only when coughing.)  Answer Assessment - Initial Assessment Questions Pt was seen in office one month ago. Pt states she was started on an antibiotic and steroid. Pt states she couldn't take the steroid but finished the antibiotics. Pt states 5 days after the abx she started feeling bad again.      ONSET: When did the cough begin?      Two weeks ago SPUTUM: Describe the color of your sputum (none, dry cough; clear, white, yellow, green)     Green yellow neon color HEMOPTYSIS: Are you coughing up any blood? If so ask: How much? (flecks, streaks, tablespoons, etc.)     No DIFFICULTY BREATHING: Are you having difficulty breathing? If Yes, ask: How bad is it? (e.g., mild, moderate, severe)    - MILD: No SOB at rest, mild SOB with walking, speaks normally in sentences, can lie down, no retractions, pulse < 100.    - MODERATE: SOB at rest, SOB with minimal exertion and prefers to sit, cannot lie down flat, speaks in phrases, mild retractions, audible wheezing, pulse 100-120.    - SEVERE: Very SOB at rest, speaks in single words, struggling to  breathe, sitting hunched forward, retractions, pulse > 120      SOB and wheezing; moderate FEVER: Do you have a fever? If Yes, ask: What is your temperature, how was it measured, and when did it start?     100.1 F last night OTHER SYMPTOMS: Do you have any other symptoms? (e.g., runny nose, wheezing, chest pain)       Tightness in chest that is constant band around chest and back started back when cough started back, 8/10 pain level  Protocols used: Cough - Acute Productive-A-AH

## 2023-12-12 NOTE — Telephone Encounter (Signed)
 FYI pt has appt with you on 01/03/2024

## 2023-12-14 ENCOUNTER — Ambulatory Visit: Payer: Self-pay

## 2023-12-14 NOTE — Telephone Encounter (Signed)
 FYI pt to ED

## 2023-12-14 NOTE — Telephone Encounter (Signed)
 FYI Only or Action Required?: FYI only for provider.  Patient is followed in Pulmonology for COPD, last seen on 11/09/2023 by Neda Jennet LABOR, MD.  Called Nurse Triage reporting Cough.  Symptoms began several weeks ago.  Interventions attempted: Rescue inhaler and Home oxygen use.  Symptoms are: unchanged.  Triage Disposition: Go to ED Now (Notify PCP)  Patient/caregiver understands and will follow disposition?: Yes                             Copied from CRM [id number]. Topic: Clinical - Red Word Triage >> Dec 14, 2023  2:31 PM Whitney O wrote: [facility] that prompted transfer to Nurse Triage: having problems breathing chest tight not feeling well    ----------------------------------------------------------------------- From previous Reason for Contact - Scheduling: Patient/patient representative is calling to schedule an appointment. Refer to attachments for appointment information. Reason for Disposition  [1] MODERATE difficulty breathing (e.g., speaks in phrases, SOB even at rest, pulse 100-120) AND [2] still present when not coughing  Answer Assessment - Initial Assessment Questions 1. ONSET: When did the cough begin?      Ongoing, states she has a cough at baseline due to COPD 2. SEVERITY: How bad is the cough today?      States she is coughing a lot more and the cough is deeper than normal, states she is having frequent coughing spells 3. SPUTUM: Describe the color of your sputum (e.g., none, dry cough; clear, white, yellow, green)     Yellowish color 5. DIFFICULTY BREATHING: Are you having difficulty breathing? If Yes, ask: How bad is it? (e.g., mild, moderate, severe)      SOB at rest and upon exertion  6. FEVER: Do you have a fever? If Yes, ask: What is your temperature, how was it measured, and when did it start?     Low grade fever, states fever is at 99.9 at this time  7. CARDIAC HISTORY: Do you have any history of heart  disease? (e.g., heart attack, congestive heart failure)      States she had a heart surgery done several years ago  8. LUNG HISTORY: Do you have any history of lung disease?  (e.g., pulmonary embolus, asthma, emphysema)     COPD at baseline, mass in lung 10. OTHER SYMPTOMS: Do you have any other symptoms? (e.g., runny nose, wheezing, chest pain)       States she wheezes almost all the time, describes chest pain and pressure, states coughing makes back hurt     States she has been having to use albuterol  inhaler more often. States she has used albuterol  inhaler 2 x today. States she has turned O2 up to 4L. Patient does not have a means to check O2 saturation. Advised ED. Patient verbalized understanding and stated she would go.  Protocols used: Cough - Acute Productive-A-AH

## 2024-01-03 ENCOUNTER — Ambulatory Visit: Payer: MEDICAID | Admitting: Primary Care

## 2024-01-09 ENCOUNTER — Telehealth: Payer: Self-pay

## 2024-01-09 NOTE — Telephone Encounter (Signed)
 Patient will need Ct scan scheduled

## 2024-01-10 ENCOUNTER — Encounter: Payer: Self-pay | Admitting: Emergency Medicine

## 2024-01-10 ENCOUNTER — Ambulatory Visit (INDEPENDENT_AMBULATORY_CARE_PROVIDER_SITE_OTHER): Payer: MEDICAID | Admitting: Emergency Medicine

## 2024-01-10 VITALS — BP 120/74 | HR 90 | Temp 98.6°F | Ht 64.0 in | Wt 182.6 lb

## 2024-01-10 DIAGNOSIS — K219 Gastro-esophageal reflux disease without esophagitis: Secondary | ICD-10-CM

## 2024-01-10 DIAGNOSIS — J84115 Respiratory bronchiolitis interstitial lung disease: Secondary | ICD-10-CM

## 2024-01-10 DIAGNOSIS — J9611 Chronic respiratory failure with hypoxia: Secondary | ICD-10-CM | POA: Diagnosis not present

## 2024-01-10 DIAGNOSIS — J441 Chronic obstructive pulmonary disease with (acute) exacerbation: Secondary | ICD-10-CM | POA: Diagnosis not present

## 2024-01-10 DIAGNOSIS — Z720 Tobacco use: Secondary | ICD-10-CM

## 2024-01-10 DIAGNOSIS — F17210 Nicotine dependence, cigarettes, uncomplicated: Secondary | ICD-10-CM

## 2024-01-10 MED ORDER — IPRATROPIUM-ALBUTEROL 0.5-2.5 (3) MG/3ML IN SOLN: 3.0000 mL | Freq: Four times a day (QID) | RESPIRATORY_TRACT | 3 refills | Status: AC | PRN

## 2024-01-10 NOTE — Assessment & Plan Note (Signed)
 Continue your oxygen as you have been taking

## 2024-01-10 NOTE — Assessment & Plan Note (Signed)
 Significant COPD with active bronchitic symptoms currently.  These have been progressively worse as her tobacco use has gone up, now 2 packs a day.  I think it is reasonable to treat her for a possible acute bronchitis with cefuroxime  but I did discuss with her the fact that the benefit will probably only be temporary since the smoking still happening.  She does understand.  Will plan to continue her inhaler regimen  Please take cefuroxime  twice a day until completely gone. Continue your Spiriva  and your Symbicort  as you have been taking them.  Rinse and gargle after the Symbicort . Keep your DuoNeb available to use 1 nebulizer treatment up to every 6 hours if needed for shortness of breath, chest tightness, wheezing.  We will refill this for you today. Follow in our office in 3 months, sooner if you have any problems.

## 2024-01-10 NOTE — Assessment & Plan Note (Signed)
 We can talk about the timing of repeating your CT scan of the chest at your next office visit.

## 2024-01-10 NOTE — Telephone Encounter (Signed)
 Pt has been scheduled and is aware of appt. NFN

## 2024-01-10 NOTE — Assessment & Plan Note (Signed)
 We talked about your cigarettes today.  We need to work on cutting down.  We set a goal today to get down to 30 cigarettes daily.  If we can achieve that goal then I want you to set a date by which she want to be down to 20 cigarettes daily.  Once we are able to cut down significantly we can talk again about possibly stopping completely.

## 2024-01-10 NOTE — Patient Instructions (Addendum)
 Please take cefuroxime  twice a day until completely gone. Continue your Spiriva  and your Symbicort  as you have been taking them.  Rinse and gargle after the Symbicort . Keep your DuoNeb available to use 1 nebulizer treatment up to every 6 hours if needed for shortness of breath, chest tightness, wheezing.  We will refill this for you today. We talked about your cigarettes today.  We need to work on cutting down.  We set a goal today to get down to 30 cigarettes daily.  If we can achieve that goal then I want you to set a date by which she want to be down to 20 cigarettes daily.  Once we are able to cut down significantly we can talk again about possibly stopping completely. Please increase your omeprazole  to twice a day for 1 month.  Try to take it around food.  Hopefully this will help your cough some Continue your oxygen as you have been taking We can talk about the timing of repeating your CT scan of the chest at your next office visit. Follow in our office in 3 months, sooner if you have any problems.

## 2024-01-10 NOTE — Progress Notes (Signed)
 Subjective:    Patient ID: Adriana Hall, female    DOB: 18-Mar-1971, 53 y.o.   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 She has been having trouble - more cough, productive of clear / green. She feels like a band across her chest. Her cig. are down to 4 a day. She has been treated x 2 w abx, pred x 1. Also changed symbicort  + spiriva  to breztri  which she prefers.   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  She continues to have dyspnea, bronchitic symptoms.  She feels like her symptoms have been more active for over 2 months with a lot of cough, a lot of green mucus, headaches, nasal congestion she is managed on Symbicort  and Spiriva , off of Flonase .  She uses DuoNeb about once a day. approximately. More sputum, more wheeze and SOB. The flonase  gave her side effect. She is having a lot of gerd on omeprazole  qd.  She is smoking more - now up to 2 pks a day.  She doesn't tolerate prednisone  due to mood changes > couldn't take it in June.    Review of Systems  Respiratory:  Positive for shortness of breath.    As per HPI  Past Medical History:  Diagnosis Date   Abnormal CT of the chest 06/28/2021   Anxiety    10 years   Arrhythmia    5 years   Bipolar 1 disorder, depressed (HCC)    Bronchitis    Colitis 03/01/2011   Colonoscopy   Depression    10 years   Diabetes mellitus without complication (HCC) 10/10/2022   Headache(784.0)    Chronic for 30 yrs   Hemorrhoids 03/01/2011   Colonoscopy    Hypertension    HYPERTENSION, BENIGN 07/30/2009   Qualifier: Diagnosis of   By: Parthenia DEVONNA Olivia Mason      HYPERTRIGLYCERIDEMIA 07/30/2009   Qualifier: Diagnosis of   By: Parthenia DEVONNA Olivia Mason      Mass of right lung 11/21/2020   Post traumatic stress disorder (PTSD) 09/07/2010   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   Qualifier: Diagnosis of   By: Parthenia DEVONNA Olivia Mason        Family History  Problem Relation Age of Onset   Pancreatic cancer Maternal Grandfather    Liver cancer Maternal Grandfather    Clotting disorder Mother    Heart disease Mother    Diabetes Paternal Grandmother        And PGF   Other Father        Brain tumor   Colon cancer Neg Hx      Social History   Socioeconomic History   Marital status: Married    Spouse name: Not on file   Number of children: 3   Years of education: Not on file   Highest education level: Not on file  Occupational History   Occupation: Self employed  Tobacco Use   Smoking status: Every Day    Current packs/day: 0.50    Average packs/day: 2.9 packs/day for 31.6 years (90.8 ttl pk-yrs)    Types: Cigarettes    Start date: 06/06/2022   Smokeless tobacco: Never  Tobacco comments:    Pt states she smokes 2 pks a day 01/10/2024  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      Home Situation: lives with husband and daughter      Spiritual Beliefs: Christian      Lifestyle: no regular CV exercise; diet is ok            Social Drivers of Corporate investment banker Strain: Medium Risk (07/26/2023)   Overall Financial Resource Strain (CARDIA)    Difficulty of Paying Living  Expenses: Somewhat hard  Food Insecurity: No Food Insecurity (07/26/2023)   Hunger Vital Sign    Worried About Running Out of Food in the Last Year: Never true    Ran Out of Food in the Last Year: Never true  Transportation Needs: No Transportation Needs (12/14/2022)   PRAPARE - Administrator, Civil Service (Medical): No    Lack of Transportation (Non-Medical): No  Physical Activity: Inactive (07/26/2023)   Exercise Vital Sign    Days of Exercise per Week: 0 days    Minutes of Exercise per Session: 0 min  Stress: Stress Concern Present (07/26/2023)   [employer] of Occupational Health - Occupational Stress Questionnaire    Feeling of Stress : Very much  Social Connections: Moderately Isolated (07/26/2023)   Social Connection and Isolation Panel    Frequency of Communication with Friends and Family: More than three times a week    Frequency of Social Gatherings with Friends and Family: Twice a week    Attends Religious Services: Never    Database administrator or Organizations: No    Attends Banker Meetings: Never    Marital Status: Married  Catering manager Violence: Not At Risk (07/26/2023)   Humiliation, Afraid, Rape, and Kick questionnaire    Fear of Current or Ex-Partner: No    Emotionally Abused: No    Physically Abused: No    Sexually Abused: No     Allergies  Allergen Reactions   Biotin     'Makes me extremely sick'   Doxycycline  Nausea And Vomiting     Outpatient Medications Prior to Visit  Medication Sig Dispense Refill   acetaminophen  (TYLENOL ) 325 MG tablet Take 2 tablets (650 mg total) by mouth every 6 (six) hours as needed for mild pain (or Fever >/= 101). 30 tablet 0   albuterol  (VENTOLIN  HFA) 108 (90 Base) MCG/ACT inhaler Inhale 2 puffs into the lungs every 4 (four) hours as needed for wheezing or shortness of breath. 18 each 2   ALPRAZolam  (XANAX ) 0.5 MG tablet Take 0.5-1 tablets (0.25-0.5 mg total) by mouth daily as needed. 30  tablet 0   Blood Glucose Monitoring Suppl DEVI 1 each by Does not apply route in the morning, at noon, and at bedtime. May substitute to any manufacturer covered by patient's insurance. 1 each 0   budesonide -formoterol  (SYMBICORT ) 160-4.5 MCG/ACT inhaler Inhale 2 puffs  into the lungs 2 (two) times daily. in the morning and at bedtime. 10.2 g 6   diphenhydrAMINE  (BENADRYL ) 25 MG tablet Take 50 mg by mouth at bedtime.     fluvoxaMINE  (LUVOX ) 100 MG tablet Take 1 tablet (100 mg total) by mouth at bedtime. 90 tablet 1   gabapentin  (NEURONTIN ) 400 MG capsule Take 1 capsule (400 mg total) by mouth 3 (three) times daily. 90 capsule 0   hydrOXYzine  (VISTARIL ) 25 MG capsule Take 1 capsule (25 mg total) by mouth 3 (three) times daily as needed for anxiety. 30 capsule 2   ibuprofen  (ADVIL ) 200 MG tablet Take 400 mg by mouth every 6 (six) hours as needed for fever or moderate pain.     nitroGLYCERIN  (NITROLINGUAL ) 0.4 MG/SPRAY spray Place 1 spray under the tongue once as needed for chest pain. 12 g 0   Nystatin  POWD Apply 1 each topically 3 (three) times daily as needed. Apply liberally to affected area 3 times per day as needed. 1 each 2   OLANZapine  (ZYPREXA ) 10 MG tablet Take 1 tablet (10 mg total) by mouth at bedtime. 30 tablet 2   ondansetron  (ZOFRAN ) 4 MG tablet Take 1 tablet (4 mg total) by mouth every 8 (eight) hours as needed for nausea or vomiting. 30 tablet 1   Semaglutide ,0.25 or 0.5MG /DOS, (OZEMPIC , 0.25 OR 0.5 MG/DOSE,) 2 MG/3ML SOPN Inject 0.5 mg into the skin once a week. 3 mL 2   Skin Protectants, Misc. (INTERDRY 10X144) SHEE Apply 1 each topically in the morning, at noon, and at bedtime. 1 each 3   Spacer/Aero-Holding Chambers (AEROCHAMBER MV) inhaler Use as instructed 1 each 2   tiotropium (SPIRIVA  HANDIHALER) 18 MCG inhalation capsule Place 1 capsule (18 mcg total) into inhaler and inhale daily. 30 capsule 2   traZODone  (DESYREL ) 100 MG tablet Take 2 tablets (200 mg total) by mouth at  bedtime as needed for sleep. 60 tablet 1   ipratropium-albuterol  (DUONEB) 0.5-2.5 (3) MG/3ML SOLN TAKE 3 MLS BY NEBULIZATION EVERY 6 (SIX) HOURS AS NEEDED. 360 mL 3   Clotrimazole  1 % LOTN Apply 1 Application topically in the morning and at bedtime. Apply to affected areas (under breasts and in the external groin region) twice daily for 2 weeks. (Patient not taking: Reported on 01/10/2024) 30 mL 2   fluticasone  (FLONASE ) 50 MCG/ACT nasal spray Place 1 spray into both nostrils daily. (Patient not taking: Reported on 01/10/2024) 18.2 mL 2   omeprazole  (PRILOSEC ) 40 MG capsule Take 40 mg by mouth daily. (Patient not taking: Reported on 01/10/2024)     predniSONE  (DELTASONE ) 10 MG tablet Take 2 tablets (20 mg total) by mouth daily with breakfast. (Patient not taking: Reported on 01/10/2024) 20 tablet 0   No facility-administered medications prior to visit.         Objective:   Physical Exam Vitals:   01/10/24 1054  BP: 120/74  Pulse: 90  Temp: 98.6 F (37 C)  SpO2: 97%  Weight: 182 lb 9.6 oz (82.8 kg)  Height: 5' 4 (1.626 m)   Gen: Pleasant,ill-appearing woman, in no distress,  normal affect, O2 in place  ENT: No lesions,  mouth clear,  oropharynx clear, no postnasal drip,  Neck: No JVD, no stridor  Lungs: No use of accessory muscles, coarse bilateral inspiratory rhonchi, mild end expiratory wheeze  Cardiovascular: RRR, heart sounds normal, no murmur or gallops, no edema  Musculoskeletal: No deformities, no cyanosis or clubbing  Neuro: alert, awake, non focal  Skin: Warm,  no lesions or rash      Assessment & Plan:   COPD (chronic obstructive pulmonary disease) (HCC) Significant COPD with active bronchitic symptoms currently.  These have been progressively worse as her tobacco use has gone up, now 2 packs a day.  I think it is reasonable to treat her for a possible acute bronchitis with cefuroxime  but I did discuss with her the fact that the benefit will probably only be temporary  since the smoking still happening.  She does understand.  Will plan to continue her inhaler regimen  Please take cefuroxime  twice a day until completely gone. Continue your Spiriva  and your Symbicort  as you have been taking them.  Rinse and gargle after the Symbicort . Keep your DuoNeb available to use 1 nebulizer treatment up to every 6 hours if needed for shortness of breath, chest tightness, wheezing.  We will refill this for you today. Follow in our office in 3 months, sooner if you have any problems.  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  Tobacco abuse We talked about your cigarettes today.  We need to work on cutting down.  We set a goal today to get down to 30 cigarettes daily.  If we can achieve that goal then I want you to set a date by which she want to be down to 20 cigarettes daily.  Once we are able to cut down significantly we can talk again about possibly stopping completely.  GERD (gastroesophageal reflux disease) Likely contributing at least some to her chronic cough.  I think this is actually being driven principally by her tobacco, respiratory bronchiolitis, mucus burden.  We will work to control GERD as able  Please increase your omeprazole  to twice a day for 1 month.  Try to take it around food.  Hopefully this will help your cough some      Lamar Chris, MD, PhD 01/10/2024, 5:01 PM [facility] [phone] or if no answer before 7:00PM call [phone] For any issues after 7:00PM please call eLink [phone]

## 2024-01-10 NOTE — Assessment & Plan Note (Signed)
 Likely contributing at least some to her chronic cough.  I think this is actually being driven principally by her tobacco, respiratory bronchiolitis, mucus burden.  We will work to control GERD as able  Please increase your omeprazole  to twice a day for 1 month.  Try to take it around food.  Hopefully this will help your cough some

## 2024-01-11 ENCOUNTER — Encounter: Payer: Self-pay | Admitting: Emergency Medicine

## 2024-01-11 ENCOUNTER — Telehealth: Payer: Self-pay

## 2024-01-11 MED ORDER — CEFUROXIME AXETIL 250 MG PO TABS: 250.0000 mg | ORAL_TABLET | Freq: Two times a day (BID) | ORAL | 0 refills | Status: AC

## 2024-01-11 NOTE — Telephone Encounter (Signed)
 Copied from CRM [id number]. Topic: Clinical - Prescription Issue >> Jan 11, 2024  2:34 PM Adriana Hall wrote: Reason for CRM: Patient states she was seen by Dr. Neda yesterday 8/6 and he advised her he will be sending in a antibiotic to her pharmacy but patient states her pharmacy does not have the antibiotic. Patient states the only prescription her pharmacy has in the nebulizer solution.   Called and spoke with Clotilda, per lov Please take cefuroxime  twice a day until completely gone. Pt is aware rx has been sent to pharmacy. Nfn

## 2024-01-15 ENCOUNTER — Other Ambulatory Visit: Payer: MEDICAID

## 2024-01-25 ENCOUNTER — Ambulatory Visit: Payer: Self-pay | Admitting: Emergency Medicine

## 2024-01-25 NOTE — Telephone Encounter (Signed)
 Patient disconnected before call was transferred to Nurse Triage. This RN placed a call to patient and left a voicemail to call back. Will place in pulmonary call backs.   Copied from CRM [id number]. Topic: Clinical - Red Word Triage >> Jan 25, 2024  3:55 PM Whitney O wrote: [facility] that prompted transfer to Nurse Triage: wheezing very congested coughing up stuff today . Dr byrum gave her antibiotics 2 weeks ago been finished for 2 days and still feeling bad

## 2024-01-25 NOTE — Telephone Encounter (Signed)
 FYI Only or Action Required?: Action required by provider: update on patient condition.  Patient is followed in Pulmonology for COPD, last seen on 01/10/2024 by Adriana Lamar RAMAN, MD.  Called Nurse Triage reporting Cough and Shortness of Breath.  Symptoms began today.  Interventions attempted: Prescription medications: abx completed 2 days ago, Maintenance inhaler, Nebulizer treatments, and Home oxygen use.  Symptoms are: gradually worsening.  Triage Disposition: See HCP Within 4 Hours (Or PCP Triage)  Patient/caregiver understands and will follow disposition?: Yes       Reason for Disposition  [1] MILD difficulty breathing (e.g., minimal/no SOB at rest, SOB with walking, pulse < 100) AND [2] still present when not coughing  Answer Assessment - Initial Assessment Questions E2C2 Pulmonary Triage - Initial Assessment Questions Chief Complaint (e.g., cough, sob, wheezing, fever, chills, sweat or additional symptoms) *Go to specific symptom protocol after initial questions.  Productive brown/beige cough that started today Recent [facility] visit and was given rx (cefUROXime  (CEFTIN ) 250 MG tablet and duoneb) - pt reports finishing abx 2 days ago, now with productive cough and increase SOB above baseline  How long have symptoms been present? today  Have you tested for COVID or Flu? Note: If not, ask patient if a home test can be taken. If so, instruct patient to call back for positive results. Yes, COVID neg  MEDICINES:   Have you used any OTC meds to help with symptoms? Yes If yes, ask What medications? Mucinex  DM Flonase   Have you used your inhalers/maintenance medication? Yes If yes, What medications? DuoNeb - has been using 2x daily with acute sx - provides minimal relief Triager reviewed/reinforced duoneb usage and SIG.   SPIRIVA  - 2 puffs in PM Symbicort  - 2 puffs twice daily  If inhaler, ask How many puffs and how often? Note: Review instructions on medication  in the chart. See above  OXYGEN: Do you wear supplemental oxygen? Yes If yes, How many liters are you supposed to use? 2 L ATC - reports has been on 3L since sickness  Do you monitor your oxygen levels? Yes If yes, What is your reading (oxygen level) today? 89-90 on O2  What is your usual oxygen saturation reading?  (Note: Pulmonary O2 sats should be 90% or greater) 96 on O2      1. ONSET: When did the cough begin?      See above 2. SEVERITY: How bad is the cough today?      Became productive today 3. SPUTUM: Describe the color of your sputum (e.g., none, dry cough; clear, white, yellow, green)     See above 4. HEMOPTYSIS: Are you coughing up any blood? If Yes, ask: How much? (e.g., flecks, streaks, tablespoons, etc.)     denies 5. DIFFICULTY BREATHING: Are you having difficulty breathing? If Yes, ask: How bad is it? (e.g., mild, moderate, severe)      Mild-mod mostly with exertion Triager does not appreciate audible SOB/wheezing during call. Pt is speaking in full sentences.  6. FEVER: Do you have a fever? If Yes, ask: What is your temperature, how was it measured, and when did it start?     denies 7. CARDIAC HISTORY: Do you have any history of heart disease? (e.g., heart attack, congestive heart failure)      Denies Endorses hx of heart surgery and problems with my heart beating too fast 8. LUNG HISTORY: Do you have any history of lung disease?  (e.g., pulmonary embolus, asthma, emphysema)     COPD  9. PE RISK FACTORS: Do you have a history of blood clots? (or: recent major surgery, recent prolonged travel, bedridden)     denies 10. OTHER SYMPTOMS: Do you have any other symptoms? (e.g., runny nose, wheezing, chest pain)       Wheezing, runny nose/congestion, chest tightness r/t not being able to take a full breath 11. PREGNANCY: Is there any chance you are pregnant? When was your last menstrual period?       N/a 12. TRAVEL: Have  you traveled out of the country in the last month? (e.g., travel history, exposures)       denies    Triager attempted to schedule with [facility], but no access. Triager will forward encounter for Dr Adriana 's office to review and advise. Patient verbalized understanding and is expecting call back from office for next steps. Triager also advised that if pt does not hear back from office, to follow disposition for further evaluation/treatment.  Protocols used: Cough - Acute Productive-A-AH

## 2024-01-26 MED ORDER — PREDNISONE 10 MG PO TABS: ORAL_TABLET | ORAL | 0 refills | Status: AC

## 2024-01-26 NOTE — Telephone Encounter (Signed)
 Would treat with prednisone  taper. If no improvement she need to contact us . Script sent for prednisone . Called her to confirm. She knows to call us  or to seek urgent care if she worsens

## 2024-02-08 ENCOUNTER — Other Ambulatory Visit: Payer: Self-pay | Admitting: Pulmonary Disease

## 2024-02-14 ENCOUNTER — Encounter: Payer: Self-pay | Admitting: Nurse Practitioner

## 2024-02-14 ENCOUNTER — Ambulatory Visit (INDEPENDENT_AMBULATORY_CARE_PROVIDER_SITE_OTHER): Payer: MEDICAID | Admitting: Nurse Practitioner

## 2024-02-14 ENCOUNTER — Ambulatory Visit: Payer: Self-pay

## 2024-02-14 ENCOUNTER — Ambulatory Visit (INDEPENDENT_AMBULATORY_CARE_PROVIDER_SITE_OTHER): Payer: MEDICAID

## 2024-02-14 VITALS — BP 138/82 | HR 85 | Temp 98.7°F | Ht 64.0 in | Wt 179.2 lb

## 2024-02-14 DIAGNOSIS — J9611 Chronic respiratory failure with hypoxia: Secondary | ICD-10-CM | POA: Diagnosis not present

## 2024-02-14 DIAGNOSIS — J019 Acute sinusitis, unspecified: Secondary | ICD-10-CM

## 2024-02-14 DIAGNOSIS — R110 Nausea: Secondary | ICD-10-CM | POA: Diagnosis not present

## 2024-02-14 DIAGNOSIS — J441 Chronic obstructive pulmonary disease with (acute) exacerbation: Secondary | ICD-10-CM | POA: Diagnosis not present

## 2024-02-14 MED ORDER — PROMETHAZINE-DM 6.25-15 MG/5ML PO SYRP: 5.0000 mL | ORAL_SOLUTION | Freq: Four times a day (QID) | ORAL | 0 refills | Status: AC | PRN

## 2024-02-14 MED ORDER — METHYLPREDNISOLONE ACETATE 80 MG/ML IJ SUSP
80.0000 mg | Freq: Once | INTRAMUSCULAR | Status: AC
  Administered 2024-02-14: 80 mg via INTRAMUSCULAR

## 2024-02-14 MED ORDER — LEVOFLOXACIN 500 MG PO TABS: 500.0000 mg | ORAL_TABLET | Freq: Every day | ORAL | 0 refills | Status: AC

## 2024-02-14 MED ORDER — METHYLPREDNISOLONE 4 MG PO TBPK: ORAL_TABLET | ORAL | 0 refills | Status: AC

## 2024-02-14 NOTE — Telephone Encounter (Signed)
 FYI Only or Action Required?: Action required by provider: request for appointment.  Patient was last seen in primary care on 07/26/2023 by Knute Thersia Bitters, FNP.  Called Nurse Triage reporting Shortness of Breath.  Symptoms began several days ago.  Interventions attempted: Prescription medications: Antibiotic.  Symptoms are: gradually worsening. Seen last month, I got Hall little better and then started getting worse again.  Triage Disposition: See HCP Within 4 Hours (Or PCP Triage)  Patient/caregiver understands and will follow disposition?: Yes   Copied from CRM #[id number]. Topic: Clinical - Red Word Triage >> Feb 14, 2024  9:12 AM Adriana Hall wrote: Red Word that prompted transfer to Nurse Triage: patient experiencing tightness in chest, wheezing, sob, throwing up, blood in mucus when blows nose Reason for Disposition  [1] MILD difficulty breathing (e.g., minimal/no SOB at rest, SOB with walking, pulse < 100) AND [2] NEW-onset or WORSE than normal  Answer Assessment - Initial Assessment Questions 1. RESPIRATORY STATUS: Describe your breathing? (e.g., wheezing, shortness of breath, unable to speak, severe coughing)      SOB, Wheezing, cough 2. ONSET: When did this breathing problem begin?      Last month 3. PATTERN Does the difficult breathing come and go, or has it been constant since it started?      Comes and goes 4. SEVERITY: How bad is your breathing? (e.g., mild, moderate, severe)      moderate 5. RECURRENT SYMPTOM: Have you had difficulty breathing before? If Yes, ask: When was the last time? and What happened that time?      yes 6. CARDIAC HISTORY: Do you have any history of heart disease? (e.g., heart attack, angina, bypass surgery, angioplasty)      yes 7. LUNG HISTORY: Do you have any history of lung disease?  (e.g., pulmonary embolus, asthma, emphysema)     COPD 8. CAUSE: What do you think is causing the breathing problem?      unsure 9. OTHER  SYMPTOMS: Do you have any other symptoms? (e.g., chest pain, cough, dizziness, fever, runny nose)     Chest pain, fever -  10. O2 SATURATION MONITOR:  Do you use an oxygen saturation monitor (pulse oximeter) at home? If Yes, ask: What is your reading (oxygen level) today? What is your usual oxygen saturation reading? (e.g., 95%)       87-97% 11. PREGNANCY: Is there any chance you are pregnant? When was your last menstrual period?       no 12. TRAVEL: Have you traveled out of the country in the last month? (e.g., travel history, exposures)       no  Protocols used: Breathing Difficulty-Hall-AH

## 2024-02-14 NOTE — Patient Instructions (Addendum)
 Continue Albuterol  inhaler 2 puffs or 3 mL neb every 6 hours as needed for shortness of breath or wheezing. Notify if symptoms persist despite rescue inhaler/neb use. Use nebs 2-3 times a day until symptoms improve  Continue Symbicort  2 puffs Twice daily. Brush tongue and rinse mouth afterwards Continue Spiriva  2 puffs daily   Restart flonase  2 sprays daily 30 minutes after your morning saline rinse Saline nasal rinse (Neil med rinse or [employer]) 1-2 times a day, use bottled, distilled water Medrol  dosepak - take as prescribed and start tomorrow Steroid shot today  Levaquin  1 tab once daily for 7 days. Take with food. Notify immediately of any severe tendon pain or watery diarrhea  Promethazine  DM cough syrup 5 mL every 6 hours as needed for cough. Do not drive after taking. May cause drowsiness   Chest x ray today  Sputum culture today   Follow up in 3-4 weeks with Dr. Shelah or Adriana Hall. If symptoms do not improve or worsen, please contact office for sooner follow up or seek emergency care.

## 2024-02-14 NOTE — Telephone Encounter (Signed)
 Noted

## 2024-02-14 NOTE — Progress Notes (Signed)
 @Patient  ID: Adriana Hall, female    DOB: [birth year], 53 y.o.   MRN: [id number]  Chief Complaint  Patient presents with   Acute Visit    Pt c/o SOB and wheezing. States she has a tight band around my chest and my back. Green phlegm with cough States she gets an upset stomach when she's using her inhaler and nebulizer. Pt states she had similar symptoms one month ago, and antibiotics helped some. She could only take prednisone  for 3 days, now feels worse than before.    Referring provider: No ref. provider found  HPI: 53 year old female, current smoker (60-pack-year history) followed for COPD, chronic respiratory failure with hypoxia on home oxygen, tobacco abuse. She is a patient of Dr. Lanny and was last seen in office 01/10/2024.  Past medical history significant for hypertension, CAD, diastolic CHF, AV regurg, GERD, HLD, anxiety, alcohol use disorder, bipolar.   TEST/EVENTS:  11/2020 CT chest: Diffuse patchy groundglass airspace opacities with loculated small right pleural effusion.  Questionable pulmonary/pleural mass along the right middle lobe.  Associated right hilar and mediastinal lymphadenopathy.  Findings concerning for malignancy with superimposed infection.  Pulmonary hypertension.  Asymmetric pleural thickening of involving only the right hemothorax.  GGO in both lungs, somewhat sparing the bases, but extending to the pleural.  Some areas of traction bronchiectasis in upper lobes, but no obvious fibrosis. 11/16/2020 pleural fluid: No malignant cells 11/18/2020 echocardiogram: LV EF 60-65%, G1 DD, normal RV size and function.  Normal RA and LA.  IVC normal size and variability.  Trivial MR otherwise normal values. 01/14/2021 CXR 2 view: Bibasilar pulmonary infiltrates again identified.  Similar to previous exam.  Upper lungs clear.  No pleural effusion or pneumothorax 05/07/2021 CT chest without contrast: There is a right middle lobe mass, measuring 4.1 x 3.3 x 3.2 cm which is  substantially reduced in size compared to 11/12/2020.  Malignancy is not excluded.  PET scan recommended for further evaluation.  There is also some mild paratracheal and subcarinal adenopathy.  Paraseptal emphysema is present with coarse peripheral interstitial attenuation, localized left lower lobe bronchiectasis and some hazy peripheral groundglass opacities in the lungs; substantially improved compared to June 2022.  Several small nodules and some tree-in-bud nodularity is present as well. 06/24/2021 PFT: FVC 64, FEV1 72, ratio 88, TLC 79, DLCOcor 67 09/01/2021 PET scan: Mass in the right middle lobe has nearly completely resolved.  No hypermetabolic activity.  The mildly enlarged subcarinal and right lower paratracheal lymph nodes are mildly hypermetabolic.   09/01/2021 super D CT: As mentioned in the PET scan read there is marked improvement in the masslike density in the right middle lobe with only a residual bandlike scar.  There is a stable 6 mm left lower lobe nodule which is not hypermetabolic and has been stable since 2012.  There is coarse bilateral subpleural interstitial extenuation, probably from fibrosis.  There is some airway thickening, suggesting bronchitis or reactive airways disease. 03/15/2022 CXR: opacity in the RLL. This has waxing and waning nature; previously not hypermetabolic but will need follow up 04/11/2022 HRCT chest: atherosclerosis. Enlarged pulmonic trunk. Mediastinal lymph nodes, measure up to 9mm. Paraseptal emphysema. Peribronchovascular and subpleural ground-glass in slightly nodular appearance and possibly minimally basilar predominant; similar to 05/2021 scan. Possible postinfectious/postinflammatory scarring vs mild fibrotic hypersensitivity pneumonitis; not UIP. Mild residual scarring in the RML. 5 mm RLL nodule, decreased from 7 mm 05/2021. 4 mm apical RUL nodule, stable. Probable 6 mm lymph node in LLL, unchanged.  01/10/2024: OV with Dr. Shelah. Continues to have  dyspnea, bronchitic cough. Feels symptoms have been more active for over 2 months with a lot of cough, green mucus, headaches, nasal congestion. Managed symbicort  and spiriva . Off of flonase . Uses Duoneb about once a day. Doesn't tolerate prednisone  due to mood changes. Treat with cefuroxime  twice a day. Has RB-ILD. Will discuss CT chest at next OV.   02/14/2024: Today - follow up Discussed the use of AI scribe software for clinical note transcription with the patient, who gave verbal consent to proceed.  History of Present Illness Adriana Hall is a 53 year old female with COPD who presents with worsening breathing difficulties.  She has been experiencing worsening breathing difficulties, which she attributes to her continued smoking habit of about two to two and a half packs per day.  She has a persistent cough that produces greenish phlegm, which has not fully cleared despite previous antibiotic treatment including cefuroxime . She is allergic to doxycycline . She was also treated in June for similar symptoms with Augmentin . Never took prednisone  recently prescribed as it makes her feel crazy so she doesn't take it. She uses her rescue inhaler three to four times a day and performs nebulizer treatments twice daily.  She recently experienced a fever reaching 102.5F last week but nothing since then. Her sinuses are swollen and painful, with occasional epistaxis when blowing her nose. No hemoptysis. She is not currently using Flonase  nasal spray or saline nasal rinses. She's eating and drinking normally. Still wheezing. No weight loss, leg swelling, chest pain. No increased O2 requirements.   She experiences nausea from the coughing and drainage. She is currently taking Mucinex  (guaifenesin ) every 12 hours.    Allergies  Allergen Reactions   Biotin     'Makes me extremely sick'   Doxycycline  Nausea And Vomiting    Immunization History  Administered Date(s) Administered    Influenza-Unspecified 05/15/2021   Pneumococcal Polysaccharide-23 10/22/2013, 12/28/2019   Tdap 12/21/2011    Past Medical History:  Diagnosis Date   Abnormal CT of the chest 06/28/2021   Anxiety    10 years   Arrhythmia    5 years   Bipolar 1 disorder, depressed (HCC)    Bronchitis    Colitis 03/01/2011   Colonoscopy   Depression    10 years   Diabetes mellitus without complication (HCC) 10/10/2022   Headache(784.0)    Chronic for 30 yrs   Hemorrhoids 03/01/2011   Colonoscopy    Hypertension    HYPERTENSION, BENIGN 07/30/2009   Qualifier: Diagnosis of   By: Parthenia DEVONNA Olivia Mason      HYPERTRIGLYCERIDEMIA 07/30/2009   Qualifier: Diagnosis of   By: Parthenia DEVONNA Olivia Mason      Mass of right lung 11/21/2020   Post traumatic stress disorder (PTSD) 09/07/2010   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   Qualifier: Diagnosis of   By: Parthenia DEVONNA Olivia Mason       Tobacco History: Social History   Tobacco Use  Smoking Status Every Day   Current packs/day: 0.50   Average packs/day: 2.9 packs/day for 31.7 years (90.8 ttl pk-yrs)   Types: Cigarettes   Start date: 06/06/2022  Smokeless Tobacco Never  Tobacco Comments   Pt states she smokes 2 pks a day 01/10/2024   Ready to quit: Not Answered Counseling given: Not Answered Tobacco comments: Pt states she smokes 2 pks a day 01/10/2024    Outpatient  Medications Prior to Visit  Medication Sig Dispense Refill   acetaminophen  (TYLENOL ) 325 MG tablet Take 2 tablets (650 mg total) by mouth every 6 (six) hours as needed for mild pain (or Fever >/= 101). 30 tablet 0   albuterol  (VENTOLIN  HFA) 108 (90 Base) MCG/ACT inhaler Inhale 2 puffs into the lungs every 4 (four) hours as needed for wheezing or shortness of breath. 18 each 2   ALPRAZolam  (XANAX ) 0.5 MG tablet Take 0.5-1 tablets (0.25-0.5 mg total) by mouth daily as needed. 30 tablet 0   Blood  Glucose Monitoring Suppl DEVI 1 each by Does not apply route in the morning, at noon, and at bedtime. May substitute to any manufacturer covered by patient's insurance. 1 each 0   budesonide -formoterol  (SYMBICORT ) 160-4.5 MCG/ACT inhaler Inhale 2 puffs into the lungs 2 (two) times daily. in the morning and at bedtime. 10.2 g 6   citalopram  (CELEXA ) 40 MG tablet Take 40 mg by mouth daily.     Clotrimazole  1 % LOTN Apply 1 Application topically in the morning and at bedtime. Apply to affected areas (under breasts and in the external groin region) twice daily for 2 weeks. 30 mL 2   gabapentin  (NEURONTIN ) 400 MG capsule Take 1 capsule (400 mg total) by mouth 3 (three) times daily. 90 capsule 0   hydrOXYzine  (VISTARIL ) 25 MG capsule Take 1 capsule (25 mg total) by mouth 3 (three) times daily as needed for anxiety. 30 capsule 2   ibuprofen  (ADVIL ) 200 MG tablet Take 400 mg by mouth every 6 (six) hours as needed for fever or moderate pain.     ipratropium-albuterol  (DUONEB) 0.5-2.5 (3) MG/3ML SOLN Take 3 mLs by nebulization every 6 (six) hours as needed. 360 mL 3   Nystatin  POWD Apply 1 each topically 3 (three) times daily as needed. Apply liberally to affected area 3 times per day as needed. 1 each 2   OLANZapine  (ZYPREXA ) 10 MG tablet Take 1 tablet (10 mg total) by mouth at bedtime. 30 tablet 2   Skin Protectants, Misc. (INTERDRY 10X144) SHEE Apply 1 each topically in the morning, at noon, and at bedtime. 1 each 3   Spacer/Aero-Holding Chambers (AEROCHAMBER MV) inhaler Use as instructed 1 each 2   SPIRIVA  HANDIHALER 18 MCG inhalation capsule PLACE 1 CAPSULE INTO INHALER AND INHALE DAILY 30 capsule 2   traZODone  (DESYREL ) 100 MG tablet Take 2 tablets (200 mg total) by mouth at bedtime as needed for sleep. 60 tablet 1   cefUROXime  (CEFTIN ) 250 MG tablet Take 1 tablet (250 mg total) by mouth 2 (two) times daily with a meal. (Patient not taking: Reported on 02/14/2024) 14 tablet 0   diphenhydrAMINE  (BENADRYL )  25 MG tablet Take 50 mg by mouth at bedtime. (Patient not taking: Reported on 02/14/2024)     fluticasone  (FLONASE ) 50 MCG/ACT nasal spray Place 1 spray into both nostrils daily. (Patient not taking: Reported on 02/14/2024) 18.2 mL 2   fluvoxaMINE  (LUVOX ) 100 MG tablet Take 1 tablet (100 mg total) by mouth at bedtime. (Patient not taking: Reported on 02/14/2024) 90 tablet 1   nitroGLYCERIN  (NITROLINGUAL ) 0.4 MG/SPRAY spray Place 1 spray under the tongue once as needed for chest pain. (Patient not taking: Reported on 02/14/2024) 12 g 0   omeprazole  (PRILOSEC ) 40 MG capsule Take 40 mg by mouth daily. (Patient not taking: Reported on 02/14/2024)     ondansetron  (ZOFRAN ) 4 MG tablet Take 1 tablet (4 mg total) by mouth every 8 (eight) hours as needed  for nausea or vomiting. (Patient not taking: Reported on 02/14/2024) 30 tablet 1   predniSONE  (DELTASONE ) 10 MG tablet Take 40mg  daily for 3 days, then 30mg  daily for 3 days, then 20mg  daily for 3 days, then 10mg  daily for 3 days, then stop (Patient not taking: Reported on 02/14/2024) 30 tablet 0   Semaglutide ,0.25 or 0.5MG /DOS, (OZEMPIC , 0.25 OR 0.5 MG/DOSE,) 2 MG/3ML SOPN Inject 0.5 mg into the skin once a week. (Patient not taking: Reported on 02/14/2024) 3 mL 2   No facility-administered medications prior to visit.     Review of Systems: as above    Physical Exam:  BP 138/82   Pulse 85   Temp 98.7 F (37.1 C)   Ht 5' 4 (1.626 m)   Wt 179 lb 3.2 oz (81.3 kg)   SpO2 98% Comment: 3L cont. o2  BMI 30.76 kg/m   GEN: Pleasant, interactive, chronically ill appearing; in no acute distress. HEENT:  Normocephalic and atraumatic. PERRLA. Sclera white. Nasal turbinates pink, moist and patent bilaterally. No rhinorrhea present. Oropharynx pink and moist, without exudate or edema. Edentulous with dental decay present. No lesions, ulcerations NECK:  Supple w/ fair ROM. No lymphadenopathy.   CV: RRR, no m/r/g, no peripheral edema. Pulses intact, +2 bilaterally.  No cyanosis, pallor or clubbing. PULMONARY:  Unlabored, regular breathing. Scattered wheezes bilaterally A&P. Bronchitic cough. No accessory muscle use. No dullness to percussion. GI: BS present and normoactive. Soft, non-tender to palpation. MSK: No erythema, warmth or tenderness. No deformities or joint swelling noted.  Neuro: A/Ox3. No focal deficits noted.   Skin: Warm, no lesions or rashe Psych: Normal affect and behavior. Judgement and thought content appropriate.     Lab Results:  CBC    Component Value Date/Time   WBC 7.8 09/14/2022 1450   WBC 8.2 04/13/2022 1506   RBC 5.12 09/14/2022 1450   RBC 5.20 (H) 04/13/2022 1506   HGB 15.7 09/14/2022 1450   HCT 47.6 (H) 09/14/2022 1450   PLT 227 09/14/2022 1450   MCV 93 09/14/2022 1450   MCH 30.7 09/14/2022 1450   MCH 21.3 (L) 11/21/2020 0111   MCHC 33.0 09/14/2022 1450   MCHC 33.2 04/13/2022 1506   RDW 14.2 09/14/2022 1450   LYMPHSABS 2.4 09/14/2022 1450   MONOABS 0.9 04/13/2022 1506   EOSABS 0.2 09/14/2022 1450   BASOSABS 0.1 09/14/2022 1450    BMET    Component Value Date/Time   NA 142 10/10/2022 0908   K 4.0 10/10/2022 0908   CL 101 10/10/2022 0908   CO2 22 10/10/2022 0908   GLUCOSE 158 (H) 10/10/2022 0908   GLUCOSE 121 (H) 04/13/2022 1506   BUN 6 10/10/2022 0908   CREATININE 0.70 10/10/2022 0908   CREATININE 0.75 08/31/2015 1259   CALCIUM 9.3 10/10/2022 0908   GFRNONAA >60 11/21/2020 0111   GFRNONAA >89 08/31/2015 1259   GFRAA >60 12/26/2019 1645   GFRAA >89 08/31/2015 1259    BNP    Component Value Date/Time   BNP 40.1 10/10/2022 0908   BNP 60.3 11/20/2020 0120     Imaging:  No results found.  methylPREDNISolone  acetate (DEPO-MEDROL ) injection 80 mg     Date Action Dose Route User   02/14/2024 1503 Given 80 mg Intramuscular (Right Upper Outer Quadrant) Heater, Duwaine RAMAN, CMA           Latest Ref Rng & Units 06/24/2021    3:50 PM  PFT Results  FVC-Pre L 2.21   FVC-Predicted Pre % 64  FVC-Post L 2.28   FVC-Predicted Post % 66   Pre FEV1/FVC % % 89   Post FEV1/FCV % % 88   FEV1-Pre L 1.96   FEV1-Predicted Pre % 72   FEV1-Post L 2.00   DLCO uncorrected ml/min/mmHg 13.79   DLCO UNC% % 67   DLCO corrected ml/min/mmHg 13.79   DLCO COR %Predicted % 67   DLVA Predicted % 80   TLC L 3.95   TLC % Predicted % 79   RV % Predicted % 97     No results found for: NITRICOXIDE      Assessment & Plan:   No problem-specific Assessment & Plan notes found for this encounter.  Assessment and Plan Assessment & Plan COPD with acute exacerbation Recurrent COPD exacerbation. Symptoms include productive cough with green sputum, wheezing, and recent fever. Previous antibiotics (cefuroxime ) provided partial relief. Prednisone  not well tolerated due to side effects. She is higher risk for pseudomonas infection; will treat her with empiric levaquin  course and collect sputum cultures. CXR to evaluate for superimposed infection. Start medrol  dosepak and reassess response. Continue aggressive mucociliary clearance therapies and bronchodilator regimen. Action plan in place.  - Administer steroid injection today - Prescribe Medrol  Dosepak to start tomorrow - Prescribe Levaquin  750 mg daily for 7 days. Side effect profile reviewed  - Order chest x-ray today - Provide sputum cup for culture; return within 4 hours of collecting  Nicotine  dependence, cigarettes Continued smoking of approximately two packs per day, contributing to recurrent COPD exacerbations. - Smoking cessation is crucial to reduce exacerbations and improve respiratory health.  Acute sinusitis Symptoms of sinusitis. No current use of Flonase  nasal spray. Empiric levaquin  should address any bacterial coinfeciton. Target sinus symptoms.  - Restart Flonase  nasal spray - Initiate saline nasal rinses twice daily using bottled distilled water  Nausea Post-tussive nausea. No vomiting. Appetite stable. No other GI symptoms.   - Prescribe promethazine -dextromethorphan  cough syrup for nausea and cough relief - Advise to avoid driving after taking cough syrup due to drowsiness - Ensure over-the-counter Mucinex  does not contain dextromethorphan ; use plain guaifenesin   Chronic respiratory failure Stable without increased O2 requirement. Goal >88-90% - Continue supplemental oxygen     I spent 45 minutes of dedicated to the care of this patient on the date of this encounter to include pre-visit review of records, face-to-face time with the patient discussing conditions above, post visit ordering of testing, clinical documentation with the electronic health record, making appropriate referrals as documented, and communicating necessary findings to members of the patients care team.  Comer LULLA Rouleau, NP 02/14/2024  Pt aware and understands NP's role.

## 2024-02-15 ENCOUNTER — Ambulatory Visit: Payer: Self-pay | Admitting: Nurse Practitioner

## 2024-02-16 NOTE — Progress Notes (Signed)
Pt notified and appt has been moved

## 2024-02-21 ENCOUNTER — Ambulatory Visit: Payer: Self-pay | Admitting: Nurse Practitioner

## 2024-02-21 DIAGNOSIS — B370 Candidal stomatitis: Secondary | ICD-10-CM

## 2024-02-21 DIAGNOSIS — B3731 Acute candidiasis of vulva and vagina: Secondary | ICD-10-CM

## 2024-02-21 MED ORDER — FLUCONAZOLE 200 MG PO TABS: 200.0000 mg | ORAL_TABLET | Freq: Every day | ORAL | 0 refills | Status: DC

## 2024-02-21 NOTE — Telephone Encounter (Signed)
 Called patient.  Came in 02/14/2024 and given an antibiotic Zpak. At the end of medication, states she has broken out with yeast in her mouth, under her breasts, and in her vaginal area.   Wants an oral medication as well as the mouthwash for yeast infections.  Katie, please advise.  Pharmacy is [facility] on [address] and [address]

## 2024-02-21 NOTE — Telephone Encounter (Signed)
 Sent in diflucan  PO x 8 days. No need to use oral rinse as well. Clean mouth well after ICS use. Thanks!

## 2024-02-21 NOTE — Telephone Encounter (Signed)
 FYI Only or Action Required?: Action required by provider: clinical question for provider and update on patient condition.  Patient was last seen in primary care on 07/26/2023 by Knute Thersia Bitters, FNP.  Called Nurse Triage reporting Rash.  Symptoms began after taking antibiotic.  Interventions attempted: OTC medications: lotion for jock itch---not helping.  Symptoms are: gradually worsening.  Triage Disposition: Call PCP When Office is Open  Patient/caregiver understands and will follow disposition?: Yes           Copied from CRM [id number]. Topic: Clinical - Red Word Triage >> Feb 21, 2024 10:18 AM Adriana Hall wrote: Red Word that prompted transfer to Nurse Triage: Patient has been taking antibiotics and prednisone  that was prescribed at her most recent visit on 9/10. Since then she has had a breakout of yeast - She stated that it is in her mouth, under her breasts as well as in her private area where she has noticed cracking, bleeding and oozing. Reason for Disposition  [1] Caller requesting NON-URGENT health information AND [2] PCP's office is the best resource  Answer Assessment - Initial Assessment Questions Came in 02/14/2024 and given an antibiotic and Zpak. At the end of medication and she states she has broken out with yeast in her mouth, under her breasts, and in her vaginal area.  Patient states that she is feeling better except for this yeast She has tried some lotion for jock itch and area near vaginal area has some broken skin with bleeding  Patient states this happens every time she takes antibiotics. Wanted an oral medication for the yeast if possible. Patient also asked about mouthwash---She believes it was Nystatin  liquid  Patient is advised to call us  back if anything changes and if anything gets worse to either call us , go to Urgent Care or the Emergency Room Patient verbalized understanding  Answer Assessment - Initial Assessment Questions Came in  02/14/2024 and given an antibiotic and Zpak. At the end of medication and she states she has broken out with yeast in her mouth, under her breasts, and in her vaginal area.  Patient states that she is feeling better except for this yeast She has tried some lotion for jock itch and area near vaginal area has some broken skin with bleeding  Patient states this happens every time she takes antibiotics. Wanted an oral medication for the yeast if possible. Patient also asked about mouthwash---She believes it was Nystatin  liquid  Patient is advised to call us  back if anything changes and if anything gets worse to either call us , go to Urgent Care or the Emergency Room Patient verbalized understanding  Protocols used: Rash or Redness - Localized-A-AH, Information Only Call - No Triage-A-AH

## 2024-02-21 NOTE — Telephone Encounter (Signed)
 Patient informed. NFN

## 2024-02-23 ENCOUNTER — Other Ambulatory Visit (HOSPITAL_BASED_OUTPATIENT_CLINIC_OR_DEPARTMENT_OTHER): Payer: Self-pay

## 2024-02-23 DIAGNOSIS — B3731 Acute candidiasis of vulva and vagina: Secondary | ICD-10-CM

## 2024-02-23 DIAGNOSIS — B370 Candidal stomatitis: Secondary | ICD-10-CM

## 2024-02-23 MED ORDER — FLUCONAZOLE 200 MG PO TABS: 200.0000 mg | ORAL_TABLET | Freq: Every day | ORAL | 0 refills | Status: AC

## 2024-03-06 ENCOUNTER — Ambulatory Visit: Payer: MEDICAID | Admitting: Nurse Practitioner

## 2024-03-26 ENCOUNTER — Ambulatory Visit: Payer: Self-pay | Admitting: Pulmonary Disease

## 2024-03-26 NOTE — Telephone Encounter (Signed)
 FYI Only or Action Required?: FYI only for provider.  Patient is followed in Pulmonology for COPD, last seen on 02/14/2024 by Malachy Comer GAILS, NP.  Called Nurse Triage reporting Shortness of Breath.  Symptoms began several days ago.  Interventions attempted: OTC medications: Mucinex  DM, Rescue inhaler, Maintenance inhaler, and Nebulizer treatments.  Symptoms are: gradually worsening.  Triage Disposition: See HCP Within 4 Hours (Or PCP Triage)  Patient/caregiver understands and will follow disposition?: Yes with modification, patient has no transport today, appointment scheduled for tomorrow       Copied from CRM [id number]. Topic: Clinical - Red Word Triage >> Mar 26, 2024 10:06 AM Rilla NOVAK wrote: [facility] that prompted transfer to Nurse Triage: Diff Breathing, even with oxygen, coughing up brown phlem      E2C2 Pulmonary Triage - Initial Assessment Questions "Chief Complaint (e.g., cough, sob, wheezing, fever, chills, sweat or additional symptoms) *Go to specific symptom protocol after initial questions. Shortness of breath  "How long have symptoms been present?" A few days   Have you tested for COVID or Flu? Note: If not, ask patient if a home test can be taken. If so, instruct patient to call back for positive results. Yes, is negative   MEDICINES:   "Have you used any OTC meds to help with symptoms?" Yes If yes, ask "What medications?" Mucinex  DM  "Have you used your inhalers/maintenance medication?" Yes If yes, "What medications?" Yes, inhaler and nebulizer    If inhaler, ask "How many puffs and how often?" Note: Review instructions on medication in the chart. Using nebulizer twice a day and inhaler 2 puffs 3 times a day    OXYGEN: "Do you wear supplemental oxygen?" Yes If yes, "How many liters are you supposed to use?" 2L but is on 3L  "Do you monitor your oxygen levels?" Yes If yes, What is your reading (oxygen level) today? 90-91%      Reason  for Disposition  [1] Longstanding difficulty breathing (e.g., CHF, COPD, emphysema) AND [2] WORSE than normal  Answer Assessment - Initial Assessment Questions 1. RESPIRATORY STATUS: Describe your breathing? (e.g., wheezing, shortness of breath, unable to speak, severe coughing)      Shortness of breath  2. ONSET: When did this breathing problem begin?      A few days  3. PATTERN Does the difficult breathing come and go, or has it been constant since it started?      Constant, worse over the last 2 days 4. SEVERITY: How bad is your breathing? (e.g., mild, moderate, severe)      Moderate  5. RECURRENT SYMPTOM: Have you had difficulty breathing before? If Yes, ask: When was the last time? and What happened that time?      Yes 6. CARDIAC HISTORY: Do you have any history of heart disease? (e.g., heart attack, angina, bypass surgery, angioplasty)      No  7. LUNG HISTORY: Do you have any history of lung disease?  (e.g., pulmonary embolus, asthma, emphysema)     COPD 8. CAUSE: What do you think is causing the breathing problem?      Unsure  9. OTHER SYMPTOMS: Do you have any other symptoms? (e.g., chest pain, cough, dizziness, fever, runny nose)     Cough with brown sputum, funny nose  10. O2 SATURATION MONITOR:  Do you use an oxygen saturation monitor (pulse oximeter) at home? If Yes, ask: What is your reading (oxygen level) today? What is your usual oxygen saturation reading? (e.g., 95%)  90-91%  Protocols used: Breathing Difficulty-A-AH

## 2024-03-27 ENCOUNTER — Ambulatory Visit: Payer: Self-pay | Admitting: Pulmonary Disease

## 2024-03-27 ENCOUNTER — Ambulatory Visit: Payer: MEDICAID | Admitting: Pulmonary Disease

## 2024-03-27 NOTE — Telephone Encounter (Signed)
 Called patient and got her rescheduled for tomorrow at 1:30 with Dr.Olalere

## 2024-03-27 NOTE — Telephone Encounter (Signed)
 [facility] T[facility] - Initial Assessment Questions Chief Complaint (e.g., cough, sob, wheezing, fever, chills, sweat or additional symptoms) *Go to specific symptom protocol after initial questions. Cough, shortness of breath   How long have symptoms been present? Several weeks   Have you tested for COVID or Flu? Note: If not, ask patient if a home test can be taken. If so, instruct patient to call back for positive results. Yes, negative   MEDICINES:   Have you used any OTC meds to help with symptoms? Yes If yes, ask What medications? Mucinex  DM  Tylenol  Motrin   Have you used your inhalers/maintenance medication? Yes If yes, What medications? As prescribed   If inhaler, ask How many puffs and how often? Note: Review instructions on medication in the chart. Using daily but not more often than prescribed  OXYGEN: Do you wear supplemental oxygen? Yes If yes, How many liters are you supposed to use? 3 liters baseline constant   Do you monitor your oxygen levels? Yes If yes, What is your reading (oxygen level) today? Yesterday 91%, recheck while on phone 87%  What is your usual oxygen saturation reading?  (Note: Pulmonary O2 sats should be 90% or greater)   Low 80's-90's    Copied from CRM #[id number]. Topic: Clinical - Red Word Triage >> Mar 27, 2024 12:44 PM Lavanda D wrote: Red Word that prompted transfer to Nurse Triage: Patient is very sick and unable to make it today for appt from red word triage. Transfer to NT since triage disposition states see within 4hrs. Symptoms have not worsened, around the same as yesterday.    ----------------------------------------------------------------------- From previous Reason for Contact - Scheduling: Patient/patient representative is calling to schedule an appointment. Refer to attachments for appointment information. Reason for Disposition  [1] MILD difficulty breathing (e.g., minimal/no SOB at rest, SOB with  walking, pulse < 100) AND [2] NEW-onset or WORSE than normal  Answer Assessment - Initial Assessment Questions Additional info:  1) Patient has an acute appointment scheduled today at 3pm, she called and asked to reschedule to tomorrow or Friday because she developed a migraine and needs to take her medicine and this makes her sleepy.  2) Retriaged patient, she feels worse than yesterday. Shortness of breath persists despite, inhaler, nebs and oxygen. She is on 3 liter of o2 which she states has been her baseline for month. Her o2 while on this call 87%, she states her baseline is low 80's-low 90's. Reinforced need for appointment today but she refuses and insisting on acute visit 03/28/24 or 03/29/24. Please advise.   [facility] T[facility] - Initial Assessment Questions Chief Complaint (e.g., cough, sob, wheezing, fever, chills, sweat or additional symptoms) *Go to specific symptom protocol after initial questions. Cough, shortness of breath   How long have symptoms been present? Several weeks   Have you tested for COVID or Flu? Note: If not, ask patient if a home test can be taken. If so, instruct patient to call back for positive results. Yes, negative   MEDICINES:   Have you used any OTC meds to help with symptoms? Yes If yes, ask What medications? Mucinex  DM  Tylenol  Motrin   Have you used your inhalers/maintenance medication? Yes If yes, What medications? As prescribed   If inhaler, ask How many puffs and how often? Note: Review instructions on medication in the chart. Using daily but not more often than prescribed  OXYGEN: Do you wear supplemental oxygen? Yes If yes, How many liters are you supposed to  use? 3 liters baseline constant   Do you monitor your oxygen levels? Yes If yes, What is your reading (oxygen level) today? Yesterday 91%, recheck while on phone 87%  What is your usual oxygen saturation reading?  (Note: Pulmonary O2 sats should be  90% or greater)   Low 80's-90's   1. RESPIRATORY STATUS: Describe your breathing? (e.g., wheezing, shortness of breath, unable to speak, severe coughing)      Short of breath, speaking in full sentences.  2. ONSET: When did this breathing problem begin?      Few weeks ago 3. PATTERN Does the difficult breathing come and go, or has it been constant since it started?      constant 4. SEVERITY: How bad is your breathing? (e.g., mild, moderate, severe)      Moderate, worse today than yesterday  5. RECURRENT SYMPTOM: Have you had difficulty breathing before? If Yes, ask: When was the last time? and What happened that time?      yes 6. CARDIAC HISTORY: Do you have any history of heart disease? (e.g., heart attack, angina, bypass surgery, angioplasty)       7. LUNG HISTORY: Do you have any history of lung disease?  (e.g., pulmonary embolus, asthma, emphysema)     yes 8. CAUSE: What do you think is causing the breathing problem?      unsure 9. OTHER SYMPTOMS: Do you have any other symptoms? (e.g., chest pain, cough, dizziness, fever, runny nose)     Migraine, brown mucous  Protocols used: Breathing Difficulty-A-AH

## 2024-03-28 ENCOUNTER — Encounter: Payer: Self-pay | Admitting: Pulmonary Disease

## 2024-03-28 ENCOUNTER — Ambulatory Visit (INDEPENDENT_AMBULATORY_CARE_PROVIDER_SITE_OTHER): Payer: MEDICAID | Admitting: Pulmonary Disease

## 2024-03-28 VITALS — BP 116/66 | HR 82 | Temp 98.2°F | Ht 64.0 in | Wt 182.0 lb

## 2024-03-28 DIAGNOSIS — J84115 Respiratory bronchiolitis interstitial lung disease: Secondary | ICD-10-CM | POA: Diagnosis not present

## 2024-03-28 DIAGNOSIS — J849 Interstitial pulmonary disease, unspecified: Secondary | ICD-10-CM

## 2024-03-28 DIAGNOSIS — J9610 Chronic respiratory failure, unspecified whether with hypoxia or hypercapnia: Secondary | ICD-10-CM

## 2024-03-28 DIAGNOSIS — F17210 Nicotine dependence, cigarettes, uncomplicated: Secondary | ICD-10-CM | POA: Diagnosis not present

## 2024-03-28 MED ORDER — AMOXICILLIN-POT CLAVULANATE 875-125 MG PO TABS: 1.0000 | ORAL_TABLET | Freq: Two times a day (BID) | ORAL | 0 refills | Status: AC

## 2024-03-28 MED ORDER — VARENICLINE TARTRATE 1 MG PO TABS: 1.0000 mg | ORAL_TABLET | Freq: Two times a day (BID) | ORAL | 3 refills | Status: AC

## 2024-03-28 MED ORDER — VARENICLINE TARTRATE 0.5 MG PO TABS: ORAL_TABLET | ORAL | 0 refills | Status: AC

## 2024-03-28 MED ORDER — NICOTINE 21 MG/24HR TD PT24
21.0000 mg | MEDICATED_PATCH | Freq: Every day | TRANSDERMAL | 2 refills | Status: AC
Start: 2024-03-28 — End: ?

## 2024-03-28 MED ORDER — PREDNISONE 10 MG PO TABS: ORAL_TABLET | ORAL | 0 refills | Status: AC

## 2024-03-28 NOTE — Patient Instructions (Signed)
 Prescription for Augmentin  antibiotic sent to pharmacy  Prednisone  20 mg daily for 7 days then 10 mg daily for 7 days  Prescription for Chantix 0.5 daily for 3 days and then 0.5 twice daily for 4 days then go to 1 mg daily  Prescription for nicotine  patch  We will repeat your CT scan of your chest  We will repeat your breathing study to compare with previous  The only thing that settles your breathing down will be quitting smoking, it is important to focus on this  Continue using your oxygen supplementation  Make sure you continue to stay active
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
## 2024-04-11 ENCOUNTER — Ambulatory Visit: Payer: MEDICAID | Admitting: Nurse Practitioner

## 2024-04-15 ENCOUNTER — Other Ambulatory Visit: Payer: MEDICAID

## 2024-05-20 ENCOUNTER — Other Ambulatory Visit: Payer: Self-pay | Admitting: Emergency Medicine

## 2024-06-10 ENCOUNTER — Other Ambulatory Visit: Payer: MEDICAID

## 2024-06-26 ENCOUNTER — Encounter: Payer: Self-pay | Admitting: Pulmonary Disease

## 2024-06-26 ENCOUNTER — Ambulatory Visit: Payer: MEDICAID | Admitting: Pulmonary Disease

## 2024-06-26 ENCOUNTER — Telehealth: Payer: Self-pay

## 2024-07-02 ENCOUNTER — Other Ambulatory Visit: Payer: Self-pay

## 2024-07-02 ENCOUNTER — Telehealth: Payer: Self-pay

## 2024-07-02 MED ORDER — TIOTROPIUM BROMIDE 18 MCG IN CAPS: 1.0000 | ORAL_CAPSULE | Freq: Every day | RESPIRATORY_TRACT | 2 refills | Status: DC

## 2024-07-02 NOTE — Telephone Encounter (Signed)
 Disregard.

## 2024-07-02 NOTE — Telephone Encounter (Signed)
 Copied from CRM #[id number]. Topic: Clinical - Medication Refill >> Jul 02, 2024 12:41 PM Dedra B wrote: Medication: SPIRIVA  HANDIHALER 18 MCG inhalation capsule  Has the patient contacted their pharmacy? Yes, said have clinic send prescription   This is the patient's preferred pharmacy:  [id number] - [place] - [address] AT [facility] OF [address] [place] [address] Phone: [phone] Fax: [phone]  Is this the correct pharmacy for this prescription? Yes  Has the prescription been filled recently? No  Is the patient out of the medication? Yes  Has the patient been seen for an appointment in the last year OR does the patient have an upcoming appointment? Yes  Can we respond through MyChart? Yes  Agent: Please be advised that Rx refills may take up to 3 business days. We ask that you follow-up with your pharmacy.    Rx sent to pharmacy   -NFN

## 2024-07-04 ENCOUNTER — Other Ambulatory Visit (HOSPITAL_COMMUNITY): Payer: Self-pay

## 2024-07-04 ENCOUNTER — Telehealth: Payer: Self-pay

## 2024-07-04 NOTE — Telephone Encounter (Signed)
*  Pulm  Pharmacy Patient Advocate Encounter   Received notification from Fax that prior authorization for Tiotropium Bromide  capsules   is required/requested.   Insurance verification completed.   The patient is insured through [facility] MEDICAID.   Per test claim:  Brand Spiriva  Handihaler  is preferred by the insurance.  If suggested medication is appropriate, Please send in a new RX and discontinue this one. If not, please advise as to why it's not appropriate so that we may request a Prior Authorization. Please note, some preferred medications may still require a PA.  If the suggested medications have not been trialed and there are no contraindications to their use, the PA will not be submitted, as it will not be approved. Archived Key: [id number]

## 2024-07-08 ENCOUNTER — Other Ambulatory Visit (HOSPITAL_BASED_OUTPATIENT_CLINIC_OR_DEPARTMENT_OTHER): Payer: Self-pay

## 2024-07-08 ENCOUNTER — Ambulatory Visit: Payer: Self-pay | Admitting: Emergency Medicine

## 2024-07-08 MED ORDER — SPIRIVA HANDIHALER 18 MCG IN CAPS: 1.0000 | ORAL_CAPSULE | Freq: Every day | RESPIRATORY_TRACT | 2 refills | Status: AC

## 2024-07-08 NOTE — Telephone Encounter (Signed)
 Pt notified brand name Spiriva  is preferred for her insurance so rx sent to pharmacy

## 2024-07-08 NOTE — Telephone Encounter (Signed)
 Rx sent to pharmacy

## 2024-08-16 ENCOUNTER — Ambulatory Visit: Payer: MEDICAID | Admitting: Emergency Medicine
